# Patient Record
Sex: Female | Born: 1975
Health system: Southern US, Community
[De-identification: ages and names within clinical notes are randomized; demographics above are authoritative.]

## PROBLEM LIST (undated history)

## (undated) DIAGNOSIS — I10 Essential (primary) hypertension: Secondary | ICD-10-CM

## (undated) DIAGNOSIS — F1010 Alcohol abuse, uncomplicated: Secondary | ICD-10-CM

## (undated) DIAGNOSIS — D649 Anemia, unspecified: Secondary | ICD-10-CM

## (undated) DIAGNOSIS — R51 Headache: Secondary | ICD-10-CM

## (undated) DIAGNOSIS — R519 Headache, unspecified: Secondary | ICD-10-CM

## (undated) DIAGNOSIS — Z720 Tobacco use: Secondary | ICD-10-CM

## (undated) DIAGNOSIS — F1910 Other psychoactive substance abuse, uncomplicated: Secondary | ICD-10-CM

## (undated) DIAGNOSIS — N189 Chronic kidney disease, unspecified: Secondary | ICD-10-CM

## (undated) HISTORY — PX: DILATION AND CURETTAGE OF UTERUS: SHX78

---

## 1998-10-21 ENCOUNTER — Emergency Department (HOSPITAL_COMMUNITY): Admission: EM | Admit: 1998-10-21 | Discharge: 1998-10-22 | Payer: Self-pay

## 1999-04-23 ENCOUNTER — Emergency Department (HOSPITAL_COMMUNITY): Admission: EM | Admit: 1999-04-23 | Discharge: 1999-04-23 | Payer: Self-pay | Admitting: *Deleted

## 1999-11-03 ENCOUNTER — Emergency Department (HOSPITAL_COMMUNITY): Admission: EM | Admit: 1999-11-03 | Discharge: 1999-11-04 | Payer: Self-pay | Admitting: *Deleted

## 2000-03-10 ENCOUNTER — Emergency Department (HOSPITAL_COMMUNITY): Admission: EM | Admit: 2000-03-10 | Discharge: 2000-03-11 | Payer: Self-pay | Admitting: Emergency Medicine

## 2000-03-13 ENCOUNTER — Observation Stay (HOSPITAL_COMMUNITY): Admission: AD | Admit: 2000-03-13 | Discharge: 2000-03-14 | Payer: Self-pay | Admitting: *Deleted

## 2000-03-13 ENCOUNTER — Encounter: Payer: Self-pay | Admitting: Emergency Medicine

## 2000-08-12 ENCOUNTER — Encounter (INDEPENDENT_AMBULATORY_CARE_PROVIDER_SITE_OTHER): Payer: Self-pay | Admitting: Specialist

## 2000-08-12 ENCOUNTER — Encounter: Payer: Self-pay | Admitting: Emergency Medicine

## 2000-08-12 ENCOUNTER — Observation Stay (HOSPITAL_COMMUNITY): Admission: RE | Admit: 2000-08-12 | Discharge: 2000-08-13 | Payer: Self-pay | Admitting: *Deleted

## 2001-09-20 ENCOUNTER — Other Ambulatory Visit: Admission: RE | Admit: 2001-09-20 | Discharge: 2001-09-20 | Payer: Self-pay | Admitting: *Deleted

## 2002-03-09 ENCOUNTER — Ambulatory Visit (HOSPITAL_COMMUNITY): Admission: EM | Admit: 2002-03-09 | Discharge: 2002-03-10 | Payer: Self-pay | Admitting: Emergency Medicine

## 2002-03-09 ENCOUNTER — Encounter (INDEPENDENT_AMBULATORY_CARE_PROVIDER_SITE_OTHER): Payer: Self-pay | Admitting: Specialist

## 2002-03-10 ENCOUNTER — Encounter: Payer: Self-pay | Admitting: Emergency Medicine

## 2004-02-02 ENCOUNTER — Emergency Department (HOSPITAL_COMMUNITY): Admission: EM | Admit: 2004-02-02 | Discharge: 2004-02-03 | Payer: Self-pay | Admitting: *Deleted

## 2004-02-05 ENCOUNTER — Inpatient Hospital Stay (HOSPITAL_COMMUNITY): Admission: EM | Admit: 2004-02-05 | Discharge: 2004-02-08 | Payer: Self-pay | Admitting: Emergency Medicine

## 2004-03-18 ENCOUNTER — Emergency Department (HOSPITAL_COMMUNITY): Admission: EM | Admit: 2004-03-18 | Discharge: 2004-03-18 | Payer: Self-pay | Admitting: Emergency Medicine

## 2004-11-25 ENCOUNTER — Emergency Department (HOSPITAL_COMMUNITY): Admission: EM | Admit: 2004-11-25 | Discharge: 2004-11-25 | Payer: Self-pay | Admitting: Emergency Medicine

## 2004-11-26 ENCOUNTER — Emergency Department (HOSPITAL_COMMUNITY): Admission: EM | Admit: 2004-11-26 | Discharge: 2004-11-27 | Payer: Self-pay | Admitting: Emergency Medicine

## 2004-11-28 ENCOUNTER — Inpatient Hospital Stay (HOSPITAL_COMMUNITY): Admission: EM | Admit: 2004-11-28 | Discharge: 2004-12-01 | Payer: Self-pay | Admitting: *Deleted

## 2006-02-04 ENCOUNTER — Inpatient Hospital Stay (HOSPITAL_COMMUNITY): Admission: EM | Admit: 2006-02-04 | Discharge: 2006-02-06 | Payer: Self-pay | Admitting: Emergency Medicine

## 2009-03-26 ENCOUNTER — Emergency Department (HOSPITAL_COMMUNITY): Admission: EM | Admit: 2009-03-26 | Discharge: 2009-03-26 | Payer: Self-pay | Admitting: Emergency Medicine

## 2009-07-30 ENCOUNTER — Emergency Department (HOSPITAL_COMMUNITY): Admission: EM | Admit: 2009-07-30 | Discharge: 2009-07-30 | Payer: Self-pay | Admitting: Emergency Medicine

## 2010-11-26 LAB — DIFFERENTIAL
Eosinophils Relative: 0 % (ref 0–5)
Monocytes Relative: 5 % (ref 3–12)
Neutro Abs: 13.1 10*3/uL — ABNORMAL HIGH (ref 1.7–7.7)
Neutrophils Relative %: 87 % — ABNORMAL HIGH (ref 43–77)

## 2010-11-26 LAB — URINE MICROSCOPIC-ADD ON

## 2010-11-26 LAB — COMPREHENSIVE METABOLIC PANEL
ALT: 31 U/L (ref 0–35)
AST: 22 U/L (ref 0–37)
Albumin: 4.9 g/dL (ref 3.5–5.2)
Alkaline Phosphatase: 51 U/L (ref 39–117)
CO2: 20 mEq/L (ref 19–32)
Calcium: 10.4 mg/dL (ref 8.4–10.5)
Chloride: 109 mEq/L (ref 96–112)
Potassium: 3.3 mEq/L — ABNORMAL LOW (ref 3.5–5.1)
Sodium: 144 mEq/L (ref 135–145)
Total Bilirubin: 0.7 mg/dL (ref 0.3–1.2)

## 2010-11-26 LAB — URINALYSIS, ROUTINE W REFLEX MICROSCOPIC
Ketones, ur: 15 mg/dL — AB
Leukocytes, UA: NEGATIVE
Nitrite: NEGATIVE
Protein, ur: 100 mg/dL — AB
Specific Gravity, Urine: 1.038 — ABNORMAL HIGH (ref 1.005–1.030)
Urobilinogen, UA: 0.2 mg/dL (ref 0.0–1.0)
pH: 5.5 (ref 5.0–8.0)

## 2010-11-26 LAB — GC/CHLAMYDIA PROBE AMP, GENITAL: Chlamydia, DNA Probe: NEGATIVE

## 2010-11-26 LAB — CBC
HCT: 44.3 % (ref 36.0–46.0)
MCV: 98.4 fL (ref 78.0–100.0)
RBC: 4.5 MIL/uL (ref 3.87–5.11)
RDW: 13.5 % (ref 11.5–15.5)
WBC: 15.1 10*3/uL — ABNORMAL HIGH (ref 4.0–10.5)

## 2010-11-26 LAB — POCT PREGNANCY, URINE: Preg Test, Ur: NEGATIVE

## 2010-11-26 LAB — WET PREP, GENITAL

## 2011-01-10 NOTE — Op Note (Signed)
NAMEETHNE, BENTS NO.:  [id number]   MEDICAL RECORD NO.:  [id number]          PATIENT TYPE:  INP   LOCATION:  0104                         FACILITY:  [facility]   PHYSICIAN:  Edsel Petrin. Dalbert Batman, M.D.DATE OF BIRTH:  May 28, 1976   DATE OF PROCEDURE:  02/04/2006  DATE OF DISCHARGE:                                 OPERATIVE REPORT   PREOPERATIVE DIAGNOSIS:  Large left gluteal abscess.   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   OPERATION PERFORMED:  Anoscopy, incision and drainage of ischiorectal  abscess, left side; repair of fistula in ano, left posterior with partial  division and placement of seton.   SURGEON:  Edsel Petrin. Dalbert Batman, M.D.   OPERATIVE INDICATIONS:  This is a 35 year old black female who presented to  the emergency room with a 1-week history of progressive pain and swelling in  the left gluteal area.  On exam she had a very large, indurated abscess  filling the medial part of the left gluteal area.  It was hard to tell  whether this was perirectal or pilonidal as it extended throughout the  entire area.  She was advised to undergo examination under anesthesia and  drainage and examination of the rectum.  She is brought to operating room  urgently.   OPERATIVE TECHNIQUE:  Following induction of general endotracheal  anesthesia, the patient was placed in a prone jackknife position.  Digital  rectal exam was performed and I did not feel any internal mass, but I did  feel that the abscess was close to the rectum externally.  The buttock and  perianal and perineal areas were prepped and draped in sterile fashion.  The  patient had already been given antibiotics.   I performed anoscopy initially after evacuating the rectum.  I found some  purulent drainage just to the left of the posterior midline.  I passed a  rectal probe through this and it did not actually  come through the skin or  into an abscess and it almost came through the skin and so I opened this up  and divided some of the internal sphincter.  I was not sure whether the  external sphincter was involved, so I placed a 0 Surgilon suture around this  and tied it down and left the cut ends long as a seton.   I then took a syringe and needle and passed this through the skin of the  buttock in the left posterior position and I immediately got lots purulent  fluid which was sent for culture.  I then made a radially oriented  elliptical incision in the left posterior position and drained a large  abscess.  I digitally explored this and it went cephalad for a short  distance and also went well down into the left anterior position.  The  recesses were fairly deep.  I felt that to get better drainage I would make  a counter incision in the left anterior position so I made a radially  oriented incision in the left anterior position and entered the  abscess  cavity.  Hemostasis was achieved with electrocautery.  The abscess cavity  was copiously irrigated with saline.  The abscess really did not have a very  foul odor and there was no evidence of any necrosis or fasciitis.  After  irrigating further, I further explored the abscess and felt like it stayed  completely on the left side, did not cross the midline at all, did not  extend deeply into the gluteal muscle and the most cephalad extension of the  abscess was all broken up.  I placed a 1/2 inch Penrose drain across the  entire abscess length and brought it out through the left anterior incision  and sutured it to the skin with nylon suture.  I inspected the anal canal  and distal rectum and it looked fine.  There was no bleeding.  Hemostasis  was good at this point.  I placed lots of dry gauze, ABD pads and fish met  panties to dress the wound.  The patient tolerated the procedure well and  was taken to recovery room in stable condition.   Estimated blood loss was  about 30 mL to 40 mL.   COMPLICATIONS:  None.  Sponge, needle and instrument counts were correct.      Edsel Petrin. Dalbert Batman, M.D.  Electronically Signed     HMI/MEDQ  D:  02/04/2006  T:  02/04/2006  Job:  [id number]

## 2011-01-10 NOTE — Discharge Summary (Signed)
[facility]  Patient:    Melody [name], Melody [name]                   MRN: [id number] Adm. Date:  [id number] Disc. Date: [id number] Attending:  Rolland Porter                           Discharge Summary  DISCHARGE DIAGNOSIS:          Pelvic inflammatory disease.  SUMMARY:                      The patient is a 35 year old female who was seen in triage and diagnosed with acute pelvic inflammatory disease.  The patient was admitted and started on IV antibiotics and was carried through for approximately 24 hours.  After this period of time, the patient was having a minimal amount of pelvic pain and was discharged on antibiotics, Ampicillin 500 mg p.o. q.6h. to be followed up in the office.  The patient understands and is being discharged. DD:  04/22/00 TD:  04/23/00 Job: [id number]

## 2011-01-10 NOTE — Consult Note (Signed)
[facility]  Patient:    Melody [name], Melody [name] Visit Number: [id number] MRN: [id number]          Service Type: OBV Location: 1E 0103 01 Attending Physician:  Lacretia Leigh Dictated by:   Joaquin Bend, M.D. Proc. Date: 03/10/02 Admit Date:  03/09/2002   CC:         OB/GYN   Consultation Report  REFERRING PHYSICIAN:  Carilyn Goodpasture. Atilano Median, M.D.  REASON FOR CONSULTATION:  Positive pregnancy test, right lower quadrant pain, and history of ectopic.  HISTORY OF PRESENT ILLNESS:  This is a 35 year old gravida 3, para 1-0-1-1, who presented to the emergency department on July 16 with several days of vomiting and right lower quadrant pain.  She did not know she was pregnant. Her last period was in June, and she was not contracepting.  She was found to have a positive pregnancy test.  An ultrasound was performed, which showed a probable right adnexal pregnancy, probably in the fallopian tube, with a gestational sac, fetal pole, and heart tones, measuring approximately 2 cm, a small amount of free fluid in the pelvis, and a probable corpus luteum on the right ovary.  The patient received IV hydration and consultation was requested for management of this condition.  PAST MEDICAL HISTORY:  The patient has a history of one previous vaginal delivery, also a previous left ectopic pregnancy managed with laparotomy and removal of left tube.  There was no mention by the patient of any pelvic pathology at that time.  The patient is otherwise healthy.  Past medical history negative for heart murmur or kidney disease.  ALLERGIES:  No known drug allergies.  SOCIAL HISTORY:  She smokes about one-third pack of cigarettes daily.  No alcohol or drug use is acknowledged.  She comes with her boyfriend.  She does not desire increased risk of conservative management and recurrent ectopic pregnancy for management of this condition.  REVIEW OF SYSTEMS:  She has no history of  STDs or PID, and review of systems is otherwise negative.  FAMILY HISTORY:  Not significant.  PHYSICAL EXAMINATION:  VITAL SIGNS:  Stable with a blood pressure of 128/93, temperature 98.8, pulse rate 89, respiratory rate 18.  GENERAL:  The patient is an alert and oriented, well-developed black female.  HEENT:  Normal.  NECK:  Without JVD or thyromegaly.  CHEST:  Lungs clear to auscultation and percussion.  CARDIAC:  Regular rhythm.  BREASTS:  Not examined.  ABDOMEN:  Mild tenderness in the right lower quadrant without rebound, mass, or guarding.  There is no CVA tenderness.  PELVIC:  A yellowish discharge in the vagina.  No cervical motion tenderness. Closed cervix.  Tenderness in the right adnexal region with mass.  LABORATORY DATA:  WBC 10.3, hemoglobin 15.1.  Glucose 115.  Quantitative hCG is 18,256.  Wet prep shows positive clue cells, otherwise negative.  GC and Chlamydia cultures are pending.  IMPRESSION:  Nausea, vomiting, and pregnancy with likely right ectopic.  PLAN:  In discussion with the patient, she does not desire conservative management or the 30% risk of recurrent ectopic with conservative therapy. She would rather have removal of the tube if the ectopic pregnancy is present in the right fallopian tube.  The patient understands that removal of the right fallopian tube with a history of a previous left salpingectomy would make her unable to have further children in the normal fashion, and she accepts this.  She is not a candidate for medical management because of  the size of the ectopic pregnancy being 2 cm, the quantitative hCG being greater than 15,000, and the positive fetal heart tones.  Will proceed with diagnostic laparoscopy and treatment of right ectopic pregnancy with the possibility of laparotomy.  Surgical complications, including bleeding, infection, damage to gallbladder, pelvic organs, anesthetic complications, and drug reactions  all explained to the patient and accepted.  The operating room was notified.  The patient has been NPO except for a few ice chips for eight hours. Dictated by:   Joaquin Bend, M.D. Attending Physician:  Lacretia Leigh DD:  03/10/02 TD:  03/10/02 Job: [id number]

## 2011-01-10 NOTE — Op Note (Signed)
NAMEBINH, FURIA NO.:  [id number]   MEDICAL RECORD NO.:  [id number]          PATIENT TYPE:  INP   LOCATION:  0104                         FACILITY:  [facility]   PHYSICIAN:  Edsel Petrin. Dalbert Batman, M.D.DATE OF BIRTH:  [birth year]   DATE OF PROCEDURE:  DATE OF DISCHARGE:                                 OPERATIVE REPORT   Audio too short to transcribe (less than 5 seconds)      Edsel Petrin. Dalbert Batman, M.D.     HMI/MEDQ  D:  02/04/2006  T:  02/04/2006  Job:  [id number]

## 2011-01-10 NOTE — Op Note (Signed)
[facility]  Patient:    Melody [name], Melody [name] Visit Number: [id number] MRN: [id number]          Service Type: EMS Location: ED Attending Physician:  Beaulah Dinning Dictated by:   Joaquin Bend, M.D. Proc. Date: 03/10/02 Admit Date:  03/09/2002 Discharge Date: 03/10/2002                             Operative Report  PREOPERATIVE DIAGNOSIS:  Right lower quadrant pain.  Suspect right, unruptured ectopic pregnancy.  POSTOPERATIVE DIAGNOSES: 1. Right lower quadrant pain.  Suspect right, unruptured ectopic pregnancy. 2. Chronic pelvic inflammatory disease.  OPERATION:  Laparoscopic right partial salpingectomy and lysis of adhesions.  SURGEON:  Joaquin Bend, M.D.  ANESTHESIA:  General plus 14 cc 0.25% Marcaine in the skin and over the tubal incision site.  ESTIMATED BLOOD LOSS:  Less than 20 cc.  COMPLICATIONS:  None.  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  INDICATIONS:  This is a 35 year old, gravida 3, para 1-0-1-1, last menstrual period in June, presented to the emergency department at [facility] was initially evaluated by Dr. Atilano Median, and consultation was made for ectopic pregnancy when an ultrasound suggested a fetal pole in the right adnexa with a fetal heartbeat and no intrauterine pregnancy and a quantitative hCG of 18,000.  Discussion with the patient of medical versus conservative surgical versus aggressive surgical management and the recurrence rate of ectopic resulted in the patient electing salpingectomy which decreased the risk of recurrence of a subsequent ectopic.  DESCRIPTION OF PROCEDURE:  After informed consent, the patient was taken to the operating room.  She was given general anesthetic, placed in dorsal lithotomy position.  Abdomen, perineum, vagina, and urethra were prepped and draped sterilely, a Foley  catheter placed in her bladder.  Bimanual examination revealed a mid position uterus with a fullness in the right adnexa.  A sponge stick was placed in the posterior vaginal vault.  A vertical subumbilical incision was made after 3 cc of 0.25% Marcaine placed in the subumbilical skin and 2 cc in the suprapubic and right lower quadrant skin areas.  Veress needle was placed, and 2.5 liters of carbon dioxide gas was insufflated, creating a pneumoperitoneum; laparoscopic trocar and sleeve were introduced and pelvic findings reviewed.  Under direct vision, a 5 mm right lower quadrant port and a 10 mm suprapubic port were placed.  The following pelvic findings were noted.  There appeared to be no injury from laparoscopic trocar placement.  There was approximately 20 cc of nonclotted blood in the pelvis.  There was evidence of previous partial excision of the left tube. The left ovary appeared normal.  There was a violin string adhesion from the omentum to the proximal portion of the left tube.  There was adhesion from the omentum to the anterior abdominal wall.  There were Fitz-Hugh and Curtis adhesions over the liver.  There was a proximal 2 cm ectopic pregnancy in the ampullary portion of the right tube.  The tube was freely mobile, and there was evidence of some bleeding out the end of the tube but no excessive hemorrhaging.  Using bipolar cutting forceps, the distal four-fifths of the tube was removed by cauterizing and incising along the mesosalpinx and after going across the tube and completely excising the ectopic pregnancy.  There was excellent hemostasis.  Marcaine was drizzled  along this operative site. Irrigation and suction removed any blood and debris.  Photodocumentation was made.  The suprapubic port was then removed after the ectopic had been placed in an EndoCatch bag.  The fascia was widened with a clamp and the bag removed. The suprapubic port fascia was then closed with Dexon  suture.  Visualization of the area of closure revealed no adherence of any structure to the anterior abdominal wall.  The other ports were removed, the gas allowed to escape, the incisions closed with 4-0 Vicryl, the vaginal instrument removed.  Sponge, needle, and instrument counts were correct.  The patient received 1 g of Ancef before the case began and was then awakened and transferred to recovery area in good condition. Dictated by:   Joaquin Bend, M.D. Attending Physician:  Beaulah Dinning DD:  03/10/02 TD:  03/14/02 Job: [id number]

## 2011-01-10 NOTE — Op Note (Signed)
[facility]  Patient:    Melody [name], Melody [name]                   MRN: [id number] Proc. Date: 08/12/00 Adm. Date:  [id number] Attending:  Margreta Journey                           Operative Report  PREOPERATIVE DIAGNOSES:       Left tubal ectopic pregnancy.  OPERATION:                    Exploratory laparotomy with a left partial salpingectomy.  POSTOPERATIVE DIAGNOSES:      Left tubal ectopic pregnancy.  SURGEON:                      Margreta Journey, M.D.  ANESTHESIA:                   General.  ESTIMATED BLOOD LOSS:         100 cc.                                Patient tolerated the procedure well and returned to the recovery room in satisfactory condition.  PROCEDURE:                    Patient was taken to the operating room, prepped and draped in the usual fashion for an exploratory laparotomy.  A Pfannenstiel incision was made.  This was carried down to the fascia at which time the fascia was entered and excised the extent of the incision.  The midline was identified and rectus muscles separated.  The abdominal peritoneum was then entered in the vertical fashion using Metzenbaum scissors.  Evaluation of the pelvic cavity revealed a moderate amount of adhesions present.  However, with some manipulation the left tube was grasped and approximately 10-[redacted] week gestational sac was present.  The tube had small ruptures around it, therefore a salpingectomy using 1-0 Chromic for tying.  This was severed and labeled and sent to pathology.  At this point hemostasis was present.  Sponge and needle count was correct x 2.  The right tube and ovary and the left ovary all appeared to be within normal limits.  The abdominal peritoneum then closed using 2-0 Chromic in a running stitch followed by closure of the fascia using 1-0 Dexon in a running stitch.  A 4-0 Monocryl used with a subcuticular stitch to close the skin.  Procedure terminated.  The patient  tolerated procedure well and returned to the recovery room in satisfactory condition.  Patient is going to be admitted for observation for the next 23 hours and reevaluated. If okay, will be discharged.  If not, will be further followed. DD:  08/12/00 TD:  08/13/00 Job: [id number]

## 2011-01-10 NOTE — H&P (Signed)
[facility]  Patient:    Melody [name], Melody [name]                   MRN: [id number] Adm. Date:  [id number] Attending:  Margreta Journey                         History and Physical  CHIEF COMPLAINT:              The patient is a 35 year old female who came to the [facility] Emergency Room for evaluation of vaginal bleeding.  HISTORY OF PRESENT ILLNESS:   The patient stated at that time that she was approximately 10-[redacted] weeks gestation.  On evaluation the patient was having very minimal tenderness and an ultrasound was obtained which showed nothing in the uterus and a complex mass on the left adnexal area.  The patient was re-evaluated by me and at that time I felt the patient had a left ectopic pregnancy and scheduled her for an exploratory laparotomy for removal of same if present.  All this was explained to the patient, who understood and accepted.  PAST MEDICAL HISTORY:         Noncontributory.  PHYSICAL EXAMINATION:  GENERAL:                      The patient is a well-developed, well-nourished female in abdominal distress.  HEENT:                        Within normal limits.  NECK:                         Supple.  BREAST:                       Without masses, tenderness, or discharge.  LUNGS:                        Clear to auscultation and percussion.  HEART:                        Normal sinus rhythm without murmurs, rubs, or gallops.  ABDOMEN:                      Benign except for the mid left lower quadrant where moderate to severe tenderness was present.  No rebound tenderness was noted.  PELVIC:                       External genitalia and BUS within normal limits. The vagina had a small amount of blood in the vault.  The cervix was firm and nontender.  The uterus was approximately six to seven weeks in size.  The right adnexa was benign.  The left adnexa revealed an area which was severely tender on palpation.  Unable to  delineate the size of this area.  ADMISSION DIAGNOSIS:          Left ectopic pregnancy, tubal.  PLAN:                         The plan is for exploratory laparotomy with removal of ectopic pregnancy. DD:  08/12/00 TD:  08/13/00 Job: [id number]

## 2011-01-10 NOTE — H&P (Signed)
Melody [name], JOAS NO.:  [id number]   MEDICAL RECORD NO.:  [id number]          PATIENT TYPE:  EMS   LOCATION:  ED                           FACILITY:  [facility]   PHYSICIAN:  Edsel Petrin. Dalbert Batman, M.D.DATE OF BIRTH:  03-11-1976   DATE OF ADMISSION:  02/04/2006  DATE OF DISCHARGE:                                HISTORY & PHYSICAL   CHIEF COMPLAINT:  Pain and swelling left buttock.   HISTORY OF PRESENT ILLNESS:  This is a 35 year old black female, previously  healthy.  For the past week she had developed progressive pain and swelling  in the left buttock.  She stated that this started up high in the presacral  area and extended down toward the rectum as the days went by.  She has been  taking tubs baths hopping that it would pop and drain but it never did.  She  has not had any prior rectal problems.  She has had chills and fever.  I was  called by Dr. Orlie Dakin to see the patient.   PAST HISTORY:  Two surgeries for ectopic pregnancy, one through a  Pfannenstiel incision, one through a laparoscopic incision; otherwise,  healthy.   CURRENT MEDICATIONS:  None.   DRUG ALLERGIES:  None known.   SOCIAL HISTORY:  Single.  Has two sons.  Smokes some cigarettes.  Drinks  alcohol several times a week, perhaps three drinks a night.  She works as a  Psychologist, counselling at [employer].  She denies the use of cocaine or  heroin.  She smoked marijuana 2 weeks ago.   FAMILY HISTORY:  Mother living, has hypertension and some type of heart  problem.  Father deceased, had liver disease.   REVIEW OF SYSTEMS:  Fifteen-system review of systems is performed and is  noncontributory except as described above.   PHYSICAL EXAMINATION:  GENERAL:  Pleasant, healthy-appearing young black  female in mild to moderate distress.  VITAL SIGNS:  Temperature 102.6, pulse 63, respirations 16, blood pressure  136/85.  HEENT:  Eyes:  Sclerae clear.  Extraocular movements intact.  Ears,  nose,  mouth and throat, nose, lips, tongue and oropharynx are without gross  lesions.  NECK:  Supple, nontender.  I do not palpate any mass or jugular venous  distension.  LUNGS:  Clear to auscultation.  No chest wall tenderness.  HEART:  Regular rate and rhythm, no murmur.  Radial, femoral, and posterior  tibial pulses are palpable.  No peripheral edema.  ABDOMEN:  Soft, flat, nontender.  Well-healed incisions.  No palpable mass.  Liver and spleen not enlarged.  GENITOURINARY:  The entire left buttock is swollen and indurated and  exquisitely tender.  This extends from the presacral area all the way down  toward the rectum but is not a classic perirectal abscess.  There is no skin  necrosis, there is no drainage.  RECTAL:  She was too tender to do a digital rectal exam but examination of  the rectal area shows no real focal abnormality.  EXTREMITIES:  She moves all four extremities well without pain or deformity.  NEUROLOGIC:  No gross motor or sensory deficits.   ASSESSMENT:  Large left buttock abscess.  This may be an extensive pilonidal  abscess or possibly a variant of a perirectal abscess.  It is not clear from  the physical exam today, although historically it did start up higher in the  presacral area.   PLAN:  The patient will be admitted, started on intravenous antibiotics, lab  work will be drawn, and she will be taken to operating room today for  examination under anesthesia and drainage of this abscess.   I have discussed the indications and details of surgery with the patient.  Risks and complications have been outlined, including but not limited to  bleeding; infection; recurrent infection; sphincter damage; cardiac,  pulmonary and thromboembolic problems.  She seems understand these issues  well.  At this time all of her questions are answered.  She is in full  agreement with this plan.      Edsel Petrin. Dalbert Batman, M.D.  Electronically Signed     HMI/MEDQ  D:   02/04/2006  T:  02/04/2006  Job:  [id number]

## 2011-01-10 NOTE — H&P (Signed)
Melody [name], Melody [name] NO.:  [id number]   MEDICAL RECORD NO.:  [id number]          PATIENT TYPE:  INP   LOCATION:  0101                         FACILITY:  [facility]   PHYSICIAN:  Jerelene Redden, MD      DATE OF BIRTH:  [birth year]   DATE OF ADMISSION:  11/28/2004  DATE OF DISCHARGE:                                HISTORY & PHYSICAL   Melody [name] is a 35 year old lady who reports that on Friday night, she  had several alcoholic beverages with friends.  On Saturday morning when she  got up, she felt nauseated and vomited on several occasions.  She initially  attributed these symptoms to a hangover, but the symptoms persisted and did  not resolve with time and rest; therefore, she initially presented to the  [facility] emergency room on April 3.  At that time, her urinalysis showed  evidence of pyuria and moderate blood was identified.  She was given  suppository for nausea and was placed on Macrobid on a b.i.d. schedule for  her urinary tract findings.  Unfortunately, she has continued to vomit.  During the course of this illness, she has not had any dysuria, urinary  frequency, hesitancy, or nocturia.  Yesterday, she had a pain which she  localized to the right flank area and said it seemed to be worse when she  took a deep breath.  Today, she has had intermittent cramping discomfort  that seems to radiate across the back of the chest bilaterally.  She is now  aware of having had any fever.  There has been no headache.  No change in  bowel habits.  Her menstrual periods are regular.  Her last menstrual period  was March 28.  Because of persistent vomiting associated with hypokalemia,  she is admitted at this time.   CURRENT MEDICATIONS:  None.   MEDICAL ILLNESSES:  1.  Patient has had ectopic pregnancies, in December, 2001, and July, 2003.  2.  There is a history of STDs, including Chlamydia.   PAST SURGICAL HISTORY:  The patient's only surgeries have been  related to  the ectopic pregnancies, as mentioned above.   SOCIAL HISTORY:  The patient is gravida 3, para 1.  She has two ectopic  pregnancies.  She has two children who are 12 and 2. one of whom is adopted.  She formerly smoked marijuana but not recently.  She has not smoked  cigarettes.  She will occasionally drink alcohol but not consistently.  She  has worked as a Magazine features editor in the past.   FAMILY HISTORY:  Remarkable for renal failure in the patient's grandmother,  uncle, and cousin.   REVIEW OF SYSTEMS:  HEAD:  She denies headache or dizziness.  EYES:  He  denies visual blurring or diplopia.  ENT:  Denies sinus pain, earache, or  sore throat.  CHEST:  See above.  CARDIOVASCULAR:  Denies orthopnea, PND, or  ankle edema.  GI:  See above.  Note that there is no history of hematemesis  or melena.  GU:  See above.  GYN:  See above.  NEURO:  There is no history  of seizure or stroke.  ENDO:  Denies excessive thirst, urinary frequency, or  nocturia.   PHYSICAL EXAMINATION:  VITAL SIGNS:  Temperature 99.3, blood pressure  150/100, pulse 100, respirations 18.  O2 saturation is 100%.  HEENT:  Within normal limits.  CHEST:  A few rhonchi which seem to clear with coughing.  Breath sounds  sound normal otherwise.  I do not hear any wheezing.  CARDIOVASCULAR:  Normal S1 and S2.  There are no rubs or gallops.  ABDOMEN:  Benign.  There are normal bowel sounds.  There is no masses or  tenderness.  No guarding or rebound.  BACK:  Examination of the back does not reveal any pain to fist percussion  or palpation.  EXTREMITIES:  Normal.  NEUROLOGIC:  Normal.   White count is 16,600.  Hemoglobin is 14.  The neutrophils are 83%.  Lymphocytes 11%.  Urinalysis continues to show increased white blood cells.  Potassium is 2.9.   IMPRESSION:  1.  Persistent vomiting:  This illness closely resembles a presentation the      patient had in June, 2005.  At that time, there was no obvious       explantation found for the patient's persistent vomiting.  2.  Hypokalemia.  3.  Evidence of urinary tract infection.  4.  History of ectopic pregnancies.  5.  History of sexually transmitted disease.   To begin with, I think this patient should be managed conservatively with IV  fluids.  I am going to place her empirically on IV Cipro for presumed  urinary tract infection.  I am going to obtain a chest x-ray and abdominal  ultrasound.  Nausea medication will be administered as needed.  We will  follow her closely.      SY/MEDQ  D:  11/28/2004  T:  11/28/2004  Job:  [id number]

## 2011-01-10 NOTE — Discharge Summary (Signed)
[name], [name]            ACCOUNT NO.:  [id number]   MEDICAL RECORD NO.:  [id number]          PATIENT TYPE:  INP   LOCATION:  [id number]                         FACILITY:  [facility]   PHYSICIAN:  Melissa L. Lovena Le, MD  DATE OF BIRTH:  Nov 13, 1975   DATE OF ADMISSION:  11/28/2004  DATE OF DISCHARGE:  12/01/2004                                 DISCHARGE SUMMARY   DISCHARGE DIAGNOSES:  1.  Urinary tract infection responding to therapy with Cipro.  2.  Intractable nausea secondary to #1, resolved.  3.  Hypokalemia.  The patient has been repleted with IV and orally.  Will      direct her to a potassium rich diet and supplement her orally x1 prior      to discharge.  4.  Cardiovascular.  The patient has a holosystolic murmur.  She states she      has had a murmur since she was a child, but at the age of 35 I would      like to have this checked.  I have recommended that she have a 2-D echo      as an outpatient.  The patient seems reliable and states that she will      follow up on this.  She is actually waiting for her Medicaid card in the      mail so she will be able to establish a practitioner.  I will provide      her with the [facility] health clinic intake and referral sheet just in      case this is not a true option for her.  At least she will have the      ability to pursue care for herself.  She states she is familiar with      [facility] as her mother utilizes their services.   DISCHARGE MEDICATIONS:  1.  Pepcid 20 mg b.i.d.  2.  Ciprofloxacin 500 mg p.o. q.12h. x5 more days.   HISTORY OF PRESENT ILLNESS:  The patient is a 35 year old African-American  female who developed the onset of nausea and vomiting after being out the  night before, having several alcoholic beverages.  The symptoms persisted  and therefore she presented to [facility] Emergency Room.  She presented to  the emergency room and was diagnosed with a urinary tract infection and  placed on Macrobid b.i.d.   She went home and was then unfortunately unable  to take her medication because she continued to vomit.  She therefore  returned to the hospital with abdominal crampy pain, nausea and vomiting,  and was admitted for further treatment.  In the emergency room she was found  to be hypokalemic and therefore was admitted to the telemetry floor for  further care.  The patient's potassium was repleted.  She was IV rehydrated.  She was started on clear liquids and advanced as tolerated to a full diet.   PHYSICAL EXAMINATION:  VITAL SIGNS:  On the day of discharge the patient's  vital signs remained stable.  Her temperature was 98, blood pressure 134/88,  pulse 88, respirations 18.  GENERAL:  Generally  she is well-developed, well-nourished, in no acute  distress.  HEENT:  Pupils equal, round, and reactive to light.  Extraocular muscles are  intact.  Mucous membranes are moist.  NECK:  Supple.  There is no JVD, no lymphadenopathy, no carotid bruits.  CHEST:  Clear to auscultation.  There are no rhonchi, rales, or wheezes.  CARDIOVASCULAR:  Regular rate and rhythm.  Positive S1/S2 with notable  holosystolic murmur not previously heard on admission.  Nonetheless the  patient has been told in early childhood that she has had a murmur.  EXTREMITIES:  There is no clubbing, cyanosis, or edema.  Pulses are 2+.  NEUROLOGIC:  She is nonfocal.  Cranial nerves II-XII are intact.   LABORATORY DATA:  Pertinent laboratory values during the course of the  hospital stay revealed a hemoglobin of 11.8, hematocrit of 34.3.  Her white  cells are 11.7 but this is down from 15.8 at a maximum, so she is trending  down.  Platelets are 213.  Her sodium is 140, potassium 3.3, chloride is  102, PO2 is 24, BUN is 1, creatinine is 0.7.  Her glucose was 99.  Other  pertinent laboratory values were a negative pregnancy test.  Amylase and  lipase were within normal limits.  LFTs were within normal limits.  Blood  cultures have  been negative x2 sets.  Her urine culture is growing  Acinetobacter and Baumanni and is sensitive to the Ciprofloxacin.  Her urine  drug screen was positive for THC.  The patient is deemed stable for  discharge for further treatment at home.  She is able to keep her food down  as well as medication.  She appears to be reliable with regard to follow-up.  We are going to recommend that she have a 2-D echo as an outpatient to  evaluate her murmur.  She states that she is waiting for her Medicaid card,  at which time she will establish care and obtain a follow-up visit.   CONDITION ON DISCHARGE:  Stable.      MLT/MEDQ  D:  12/01/2004  T:  12/01/2004  Job:  [id number]

## 2011-01-10 NOTE — Discharge Summary (Signed)
Melody [name], Melody [name]                      ACCOUNT NO.:  [id number]   MEDICAL RECORD NO.:  [id number]                   PATIENT TYPE:  INP   LOCATION:  [place]                                 FACILITY:  [facility]   PHYSICIAN:  Corinna L. Conley Canal, MD             DATE OF BIRTH:  August 21, 1976   DATE OF ADMISSION:  02/05/2004  DATE OF DISCHARGE:  02/08/2004                                 DISCHARGE SUMMARY   DIAGNOSES:  1. Gastroenteritis, probably viral.  2. Probable ovarian hemorrhagic cyst.  3. Dehydration.  4. Hypokalemia.   DISCHARGE MEDICATIONS:  Phenergan 25 mg p.o. q.4h. as needed for pain.   DIET:  As tolerated.   ACTIVITY:  She may return to work in four days.   CONDITION:  Stable.   FOLLOW UP:  With her primary care physician as needed.   PERTINENT LABORATORIES:  CBC was significant for a white blood cell count of  12.6 with a normal differential.  Complete metabolic panel was significant  for a potassium of 3.2, total bilirubin 1.3.  Amylase and lipase were  normal.  UA essentially negative.  Urine HCG negative.   SPECIAL STUDIES AND RADIOLOGY:  CT of the abdomen showed a probable  hemorrhagic cyst of the ovary, otherwise negative.   Ultrasound of the abdomen was negative except for a small amount of  gallbladder sludge.   HISTORY AND HOSPITAL COURSE:  [name] is a 35 year old unassigned black  female who presented to the emergency room with nausea, vomiting, and  diarrhea.  She was given IV fluids and antiemetics but continued with  intractable vomiting.  She was therefore put on observation on the  hospitalist service, given IV fluids and antiemetics.  Unfortunately, she  continued to have significant vomiting, even with liquids, so she was  changed to a regular admission.  As she had several days of continued  vomiting, ultrasound was done.  This was normal.  She was ruled out for  pancreatitis.  She never really had any abdominal tenderness but did have a  few episodes of abdominal pain.  A CAT scan was done in the emergency room.  Eventually, her vomiting did subside, and she was tolerating a regular diet  and is therefore being discharged to home.                                               Corinna L. Conley Canal, MD    CLS/MEDQ  D:  02/08/2004  T:  02/09/2004  Job:  [id number]

## 2011-01-10 NOTE — H&P (Signed)
NAME:  Melody [name], Melody [name] NO.:  [id number]   MEDICAL RECORD NO.:  [id number]                   PATIENT TYPE:  INP   LOCATION:  0103                                 FACILITY:  [facility]   PHYSICIAN:  Jon Gills, M.D.             DATE OF BIRTH:  09-Sep-1975   DATE OF ADMISSION:  02/05/2004  DATE OF DISCHARGE:                                HISTORY & PHYSICAL   IDENTIFYING STATEMENT:  This is a 35 year old African-American female, who  does not have a designated primary care physician, who has just selected an  OB/GYN here in town, whom she cannot name at this time.   CHIEF COMPLAINT:  Persistent vomiting.   HISTORY OF PRESENT ILLNESS:  This 35 year old female, who was seen in the  emergency room at [facility] on February 02, 2004 with complaints of  nausea, vomiting, and diarrhea, comes back in with persistent vomiting.  When she was seen on February 02, 2004, she also had some vaginal discharge, for  which they had done a pelvic exam, and had done GC and Chlamydia studies,  which have been negative.  She also had a wet prep, which was negative.  She  was at that time sent home after being given Zofran in the emergency room,  and after receiving a ceftriaxone IM shot and about a gram's worth of  Zithromax for possible cervicitis.  The patient also says that she was given  an anti-emetic to take home; however, her mom had taken it home with her  rather than the patient.  The patient comes back in today secondary to  persistence of the nausea and vomiting.  She says she has vomited a few  times at home today.  She apparently has not been able to keep anything  down.  She has had no more diarrhea since Saturday, but has continued to  have stomach cramps, and she, when asked, gives reference to the pain in the  epigastric area.  She has not been taking in much oral intake.  She did take  Aleve x1 the day before.  She had taken this for stomach cramps.  She  denies  taking any aspirin or any other forms of anti-inflammatories.  She does not  smoke cigarettes.  She does do marijuana; the last time was a couple of  months ago.  She does drink, but her last drink was this past Thursday.  No  one else is sick around her.  Her last menstrual period was the last week of  May.  As mentioned earlier, she did have a pelvic examination done on February 02, 2004.  Her vaginal discharge is mostly whitish with some yellowish tint,  which is not unusual for her.  In the emergency room, she was given IV  fluids, Phenergan, and Dilaudid.  It was felt by the ER physician that the  patient needed admission, and therefore the [facility]  Hospitalist was called.   Her urine pregnancy test was negative here in the emergency room, and the  patient also denies the possibility of pregnancy, since she has had her  fallopian tubes removed secondary to ectopic pregnancies.   She has been soaking in warm water for her stomach cramps.  She also denies  dysuria and frequency of urination.  She also denies hematuria.  Also denies  melena, hematochezia, or heartburn.  She denies hematemesis.   ALLERGIES:  No known drug allergies.   CURRENT MEDICATIONS:  She denies any regular medicines including vitamins.  She denies being on birth control pills also.   PAST MEDICAL HISTORY:  1. Significant for history of ectopic pregnancy in December of 2001 and July     of 2003.  2. She does have a history of Chlamydia, the last one being a couple of     years ago.   PAST SURGICAL HISTORY:  All related to the ectopic pregnancies, the right  one in December of 2001 that she had surgically corrected, and the left one  in July of 2003.   SOCIAL HISTORY:  She is single.  She is gravida 3, para 1, with 2 ectopic  pregnancies.  She has 2 live children, one of whom is adopted.  She does not  smoke cigarettes, but smokes marijuana; the last time was a couple of months  ago.  She does drink maybe  2 drinks 2 times a month.  Her last drink was  this past Thursday.  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  She does not have a full certificate for her  nurse's aid yet at this time.   FAMILY HISTORY:  Kidney failure (she is unclear of the etiology) in the  maternal grandmother, maternal uncle, and maternal side cousin.  There is no  diabetes mellitus, stroke, MI, or cancers of any kind.  There is  hypertension in the mother and maternal aunt.   REVIEW OF SYSTEMS:  As per HPI.  She otherwise notes a sense of chest  tightness when she takes a deep breath, but denies a cough or persistence of  cough.  Denies any frequency of urination or dysuria.  She denies headache.  Does note some sore throat secondary to the persistent emesis.   PHYSICAL EXAMINATION:  VITAL SIGNS:  Her latest vitals show her to have a  temperature of 99.9, saturations of 99% on room air, blood pressure 144/84,  heart rate of 57.  Her initial vitals when she first came to the emergency  room at about noon today were a blood pressure of 135/99, pulse of 78,  respiratory rate 20, saturations of 99% on room air.  The lowest blood  pressure that she has had in the emergency room was 113/73, and the highest  has been 149/106.  GENERAL:  She does not appear to be in any distress, except she does go  through periods of having some nausea with some emesis.  HEENT:  TM's are normal.  Oropharynx is without any erythema.  Tongue  actually looks moist now after the IV fluids.  There is no icterus.  NECK:  There is no adenopathy.  LUNGS:  Clear to auscultation bilaterally without any crackles or wheezes.  HEART:  Regular rate and rhythm.  ABDOMEN:  Bowel sounds are normal, soft, and it is nontender in all  quadrants including the epigastrium.  She says it is not tender,  it is just  a discomfort she says.  BACK:  There is no spinous tenderness, no CVA tenderness. NEUROLOGIC:  There are no  gross neurological deficits.   LABORATORY WORK UP:  As mentioned earlier, her GC and Chlamydia from February 02, 2004 were negative.  Her wet prep was negative.  Her UA done today  showed a specific gravity of 1.040, a small bilirubin.  Ketones are  positive.  Protein is positive.  Leukocyte esterase is trace with  microscopic showing 3-6 WBC's, a few bacteria.  Her CBC shows a white count  of 12.6, hemoglobin and hematocrit of 16.1 and 46 respectively, platelet  count of 204,000.  Her differential is 69% neutrophils.  Lymphocytes of 23.  A BMP or a CMP was not done today; however, a BMP from February 02, 2004 shows a  sodium of 137, potassium of 3.4, chloride and CO2 of 109 and 18, glucose of  164.  BUN and creatinine were normal at 11 and 1.  Calcium of 10.  Once  again, a urine pregnancy test was done, and that was negative.   CT of the abdomen and pelvis was done, and the only mention was the left  ovary shows a ring enhancing lesion with a question of whether it could be a  collapsed or a hemorrhagic cyst.  The radiologist recommends a follow up  ultrasound to reassess this.   ASSESSMENT AND PLAN:  1. Dehydration secondary to persistent nausea and vomiting, some of which is     because she did not have an anti-emetic.  She probably has a component of     gastritis contributing toward some of her nausea.  She is certainly     dehydrated, as evidenced by her upper limit of normal on the hemoglobin     and hematocrit, and the specific gravity on the urine being 1.040, and     also the positive ketones.  I will go ahead and bring her for a 23     observation for IV fluids.  2. In regards to her gastritis, will place her on Protonix IV b.i.d., and     then daily orally.  She most likely will need to go home on a couple of     weeks worth of Protonix.  3. In regards to the left ovarian ring enhancing lesion, suggestive of     collapsed or hemorrhagic cyst, I have informed the patient about  this,     and advised her that she needs to have a follow up ultrasound through her     new gynecologist, and advised her to set up an appointment with her     gynecologist.  4. In regards to question of urinary tract infection by urinalysis, the     patient is asymptomatic; therefore, will not initiate any treatment at     this time.  5. In regards to transient episodic elevation in blood pressure, it could be     related to when the blood pressures are taken around the time when she is     significantly nauseated with some emesis; therefore, will watch this.     There is certainly a family history of hypertension, so she does need to     watch this.  6. In regards to recent mild hypokalemia, will go ahead and do a BMP on her     today to see whether we need to give her any replacements.  Hopefully  this patient can be discharged within the next 23 hours.  Will need to     watch her fever curves, as she was starting to develop some chills    towards the end of my exam, and the low-grade temperature, but all of it     could be towards the tail end of the gastroenteritis, which I am thinking     is on the improving end.                                               Jon Gills, M.D.    RRV/MEDQ  D:  02/06/2004  T:  02/06/2004  Job:  [id number]

## 2011-08-28 ENCOUNTER — Emergency Department (HOSPITAL_COMMUNITY)
Admission: EM | Admit: 2011-08-28 | Discharge: 2011-08-28 | Disposition: A | Discharge status: Home / Self Care | Payer: Self-pay | Attending: Emergency Medicine | Admitting: Emergency Medicine

## 2011-08-28 ENCOUNTER — Encounter: Payer: Self-pay | Admitting: *Deleted

## 2011-08-28 DIAGNOSIS — F17200 Nicotine dependence, unspecified, uncomplicated: Secondary | ICD-10-CM | POA: Insufficient documentation

## 2011-08-28 DIAGNOSIS — R040 Epistaxis: Secondary | ICD-10-CM | POA: Insufficient documentation

## 2011-08-28 MED ORDER — OXYMETAZOLINE HCL 0.05 % NA SOLN
1.0000 | Freq: Once | NASAL | Status: AC
  Administered 2011-08-28: 1 via NASAL
  Filled 2011-08-28: qty 15

## 2011-08-28 MED ORDER — LISINOPRIL 10 MG PO TABS: 10.0000 mg | ORAL_TABLET | Freq: Every day | ORAL | Status: DC

## 2011-08-28 MED ORDER — LISINOPRIL 10 MG PO TABS: 10.0000 mg | ORAL_TABLET | Freq: Once | ORAL | Status: DC

## 2011-08-28 NOTE — ED Notes (Signed)
Pt states "my nose started bleeding x 2 days ago, it was gushing, bled this a.m. Around 0830-0900 and again was gushing, was told I was borderline with high blood pressure last yr, it runs in my family"; pt without active bleeding @ present.

## 2011-08-28 NOTE — ED Provider Notes (Signed)
History     CSN: [id number]  Arrival date & time 08/28/11  [id number]   First MD Initiated Contact with Patient 08/28/11 1120      Chief Complaint  Patient presents with  . Epistaxis    (Consider location/radiation/quality/duration/timing/severity/associated sxs/prior treatment) Patient is a 36 y.o. female presenting with nosebleeds. The history is provided by the patient. No language interpreter was used.  Epistaxis  This is a new problem. The current episode started 6 to 12 hours ago. The problem occurs rarely. The problem has been resolved. The bleeding has been from the left nare. She has tried applying pressure for the symptoms. Her past medical history does not include bleeding disorder, colds, sinus problems, allergies, nose-picking or frequent nosebleeds.  Reports a L nare nose bleed x 20 minutes today with the same 2 days ago.  Resolved now.  No pcp.  Hypertensive in the past. On no meds.  Daily Etoh.    Past Medical History  Diagnosis Date  . Ectopic pregnancy     History reviewed. No pertinent past surgical history.  No family history on file.  History  Substance Use Topics  . Smoking status: Current Everyday Smoker -- 0.5 packs/day  . Smokeless tobacco: Not on file  . Alcohol Use: 4.5 oz/week    9 drink(s) per week    OB History    Grav Para Term Preterm Abortions TAB SAB Ect Mult Living                  Review of Systems  HENT: Positive for nosebleeds.     Allergies  Review of patient's allergies indicates no known allergies.  Home Medications  No current outpatient prescriptions on file.  BP 169/114  Pulse 74  Temp(Src) 99.2 F (37.3 C) (Oral)  Resp 17  Wt 155 lb (70.308 kg)  SpO2 100%  LMP 08/26/2011  Physical Exam  Nursing note and vitals reviewed. Constitutional: She is oriented to person, place, and time. She appears well-developed and well-nourished. No distress.  HENT:  Head: Normocephalic and atraumatic.  Eyes: Pupils are equal, round,  and reactive to light.  Neck: Normal range of motion.  Cardiovascular: Normal rate.  Exam reveals no gallop and no friction rub.   No murmur heard. Pulmonary/Chest: Effort normal.  Abdominal: Soft.  Musculoskeletal: Normal range of motion.  Neurological: She is alert and oriented to person, place, and time.  Skin: Skin is warm and dry.  Psychiatric: She has a normal mood and affect.    ED Course  Procedures (including critical care time)  Labs Reviewed - No data to display No results found.   No diagnosis found.    MDM  Nose bleed x 2 in the last 2 days resolved.  Hypertensive with pmh of the same but on no meds. No pcp. Will start lisinopril 10 mg daily and instructed to keep b/p diary and get a pcp to follow up with asap. Return if uncontrolled nose bleed.    Medical screening examination/treatment/procedure(s) were performed by non-physician practitioner and as supervising physician I was immediately available for consultation/collaboration. Katy Apo, M.D.       Sheryle Hail, NP 08/28/11 Wisner III, MD 08/31/11 2126

## 2011-11-15 ENCOUNTER — Emergency Department (HOSPITAL_COMMUNITY): Payer: Self-pay

## 2011-11-15 ENCOUNTER — Emergency Department (HOSPITAL_COMMUNITY)
Admission: EM | Admit: 2011-11-15 | Discharge: 2011-11-15 | Disposition: A | Discharge status: Home / Self Care | Payer: Self-pay | Attending: Emergency Medicine | Admitting: Emergency Medicine

## 2011-11-15 ENCOUNTER — Encounter (HOSPITAL_COMMUNITY): Payer: Self-pay | Admitting: Emergency Medicine

## 2011-11-15 DIAGNOSIS — L539 Erythematous condition, unspecified: Secondary | ICD-10-CM | POA: Insufficient documentation

## 2011-11-15 DIAGNOSIS — F17200 Nicotine dependence, unspecified, uncomplicated: Secondary | ICD-10-CM | POA: Insufficient documentation

## 2011-11-15 DIAGNOSIS — M25476 Effusion, unspecified foot: Secondary | ICD-10-CM | POA: Insufficient documentation

## 2011-11-15 DIAGNOSIS — Z79899 Other long term (current) drug therapy: Secondary | ICD-10-CM | POA: Insufficient documentation

## 2011-11-15 DIAGNOSIS — M25579 Pain in unspecified ankle and joints of unspecified foot: Secondary | ICD-10-CM | POA: Insufficient documentation

## 2011-11-15 DIAGNOSIS — M109 Gout, unspecified: Secondary | ICD-10-CM | POA: Insufficient documentation

## 2011-11-15 DIAGNOSIS — M79609 Pain in unspecified limb: Secondary | ICD-10-CM | POA: Insufficient documentation

## 2011-11-15 DIAGNOSIS — M25473 Effusion, unspecified ankle: Secondary | ICD-10-CM | POA: Insufficient documentation

## 2011-11-15 MED ORDER — INDOMETHACIN 50 MG PO CAPS: 50.0000 mg | ORAL_CAPSULE | Freq: Three times a day (TID) | ORAL | Status: AC

## 2011-11-15 MED ORDER — HYDROCODONE-ACETAMINOPHEN 5-325 MG PO TABS: 1.0000 | ORAL_TABLET | ORAL | Status: AC | PRN

## 2011-11-15 MED ORDER — INDOMETHACIN 50 MG PO CAPS
50.0000 mg | ORAL_CAPSULE | ORAL | Status: AC
  Administered 2011-11-15: 50 mg via ORAL
  Filled 2011-11-15: qty 1

## 2011-11-15 MED ORDER — HYDROCODONE-ACETAMINOPHEN 5-325 MG PO TABS
1.0000 | ORAL_TABLET | Freq: Once | ORAL | Status: AC
  Administered 2011-11-15: 1 via ORAL
  Filled 2011-11-15: qty 1

## 2011-11-15 NOTE — ED Provider Notes (Signed)
Medical screening examination/treatment/procedure(s) were performed by non-physician practitioner and as supervising physician I was immediately available for consultation/collaboration.   Malvin Johns, MD 11/15/11 [phone]

## 2011-11-15 NOTE — Discharge Instructions (Signed)
Arthritis - Gout Gout is caused by uric acid crystals forming in the joint. The big toe, foot, ankle, and knee are the joints most often affected.Often uric acid levels in the blood are also elevated. Symptoms develop rapidly, usually over hours. A joint fluid exam may be needed to prove the diagnosis. Acute gout episodes may follow a minor injury, surgery, illness or medication change. Gout occurs more often in men. It tends to be an inherited (passed down from a family member) condition. Your chance of having gout is increased if you take certain medications. These include water pills (diuretics), aspirin, niacin, and cyclosporine. Kidney disease and alcohol consumption may also increase your chances of getting gout. Months or years can pass between gout attacks.  Treatment includes ice, rest and raising the affected limb until the swelling and pain are better.Anti-inflammatory medicine usually brings about dramatic relief of pain, redness and swelling within 2 to 3 days. Other medications can also be effective. Increase your fluid intake. Do not drink alcohol or eat liver, sweetbreads, or sardines. Long-term gout management may require medicine to lower blood uric acid levels, or changing or stopping diuretics. Please see your caregiver if your condition is not better after 1 to 2 days of treatment.  SEEK IMMEDIATE MEDICAL CARE IF:  You have more serious symptoms such as a fever, skin rash, diarrhea, vomiting, or other joint pains. Document Released: 09/18/2004 Document Revised: 07/31/2011 Document Reviewed: 10/02/2008 [facility] Patient Information 2012 [place].Gout Gout is an inflammatory condition (arthritis) caused by a buildup of uric acid crystals in the joints. Uric acid is a chemical that is normally present in the blood. Under some circumstances, uric acid can form into crystals in your joints. This causes joint redness, soreness, and swelling (inflammation). Repeat attacks are common. Over  time, uric acid crystals can form into masses (tophi) near a joint, causing disfigurement. Gout is treatable and often preventable. CAUSES  The disease begins with elevated levels of uric acid in the blood. Uric acid is produced by your body when it breaks down a naturally found substance called purines. This also happens when you eat certain foods such as meats and fish. Causes of an elevated uric acid level include:  Being passed down from parent to child (heredity).   Diseases that cause increased uric acid production (obesity, psoriasis, some cancers).   Excessive alcohol use.   Diet, especially diets rich in meat and seafood.   Medicines, including certain cancer-fighting drugs (chemotherapy), diuretics, and aspirin.   Chronic kidney disease. The kidneys are no longer able to remove uric acid well.   Problems with metabolism.  Conditions strongly associated with gout include:  Obesity.   High blood pressure.   High cholesterol.   Diabetes.  Not everyone with elevated uric acid levels gets gout. It is not understood why some people get gout and others do not. Surgery, joint injury, and eating too much of certain foods are some of the factors that can lead to gout. SYMPTOMS   An attack of gout comes on quickly. It causes intense pain with redness, swelling, and warmth in a joint.   Fever can occur.   Often, only one joint is involved. Certain joints are more commonly involved:   Base of the big toe.   Knee.   Ankle.   Wrist.   Finger.  Without treatment, an attack usually goes away in a few days to weeks. Between attacks, you usually will not have symptoms, which is different from many  other forms of arthritis. DIAGNOSIS  Your caregiver will suspect gout based on your symptoms and exam. Removal of fluid from the joint (arthrocentesis) is done to check for uric acid crystals. Your caregiver will give you a medicine that numbs the area (local anesthetic) and use a  needle to remove joint fluid for exam. Gout is confirmed when uric acid crystals are seen in joint fluid, using a special microscope. Sometimes, blood, urine, and X-ray tests are also used. TREATMENT  There are 2 phases to gout treatment: treating the sudden onset (acute) attack and preventing attacks (prophylaxis). Treatment of an Acute Attack  Medicines are used. These include anti-inflammatory medicines or steroid medicines.   An injection of steroid medicine into the affected joint is sometimes necessary.   The painful joint is rested. Movement can worsen the arthritis.   You may use warm or cold treatments on painful joints, depending which works best for you.   Discuss the use of coffee, vitamin C, or cherries with your caregiver. These may be helpful treatment options.  Treatment to Prevent Attacks After the acute attack subsides, your caregiver may advise prophylactic medicine. These medicines either help your kidneys eliminate uric acid from your body or decrease your uric acid production. You may need to stay on these medicines for a very long time. The early phase of treatment with prophylactic medicine can be associated with an increase in acute gout attacks. For this reason, during the first few months of treatment, your caregiver may also advise you to take medicines usually used for acute gout treatment. Be sure you understand your caregiver's directions. You should also discuss dietary treatment with your caregiver. Certain foods such as meats and fish can increase uric acid levels. Other foods such as dairy can decrease levels. Your caregiver can give you a list of foods to avoid. HOME CARE INSTRUCTIONS   Do not take aspirin to relieve pain. This raises uric acid levels.   Only take over-the-counter or prescription medicines for pain, discomfort, or fever as directed by your caregiver.   Rest the joint as much as possible. When in bed, keep sheets and blankets off painful  areas.   Keep the affected joint raised (elevated).   Use crutches if the painful joint is in your leg.   Drink enough water and fluids to keep your urine clear or pale yellow. This helps your body get rid of uric acid. Do not drink alcoholic beverages. They slow the passage of uric acid.   Follow your caregiver's dietary instructions. Pay careful attention to the amount of protein you eat. Your daily diet should emphasize fruits, vegetables, whole grains, and fat-free or low-fat milk products.   Maintain a healthy body weight.  SEEK MEDICAL CARE IF:   You have an oral temperature above 102 F (38.9 C).   You develop diarrhea, vomiting, or any side effects from medicines.   You do not feel better in 24 hours, or you are getting worse.  SEEK IMMEDIATE MEDICAL CARE IF:   Your joint becomes suddenly more tender and you have:   Chills.   An oral temperature above 102 F (38.9 C), not controlled by medicine.  MAKE SURE YOU:   Understand these instructions.   Will watch your condition.   Will get help right away if you are not doing well or get worse.  Document Released: 08/08/2000 Document Revised: 07/31/2011 Document Reviewed: 11/19/2009 [facility] Patient Information 2012 [place].Purine Restricted Diet A low-purine diet consists of foods  that reduce uric acid made in your body. INDICATIONS FOR USE  Your caregiver may ask you to follow a low-purine diet to reduce gout flairs.  GUIDELINES  Avoid high-purine foods, including all alcohol, yeast extracts taken as supplements, and sauces made from meats (like gravy). Do not eat high-purine meats, including anchovies, sardines, herring, mussels, tuna, codfish, scallops, trout, haddock, bacon, organ meats, tripe, goose, wild game, and sweetbreads.  Grains  Allowed/Recommended: All, except those listed to consume in moderation.   Consume in Moderation: Oatmeal (? cup uncooked daily), wheat bran or germ ( cup daily), and whole  grains.  Vegetables  Allowed/Recommended: All, except those listed to consume in moderation.   Consume in Moderation: Asparagus, cauliflower, spinach, mushrooms, and green peas ( cup daily).  Fruit  Allowed/Recommended: All.   Consume in Moderation: None.  Meat and Meat Substitutes  Allowed/Recommended: Eggs, nuts, and peanut butter.   Consume in Moderation: Limit to 4 to 6 oz daily. Avoid high-purine meats. Lentils, peas, and dried beans (1 cup daily).  Milk  Allowed/Recommended: All. Choose low-fat or skim when possible.   Consume in Moderation: None.  Fats and Oils  Allowed/Recommended: All.   Consume in Moderation: None.  Beverages  Allowed/Recommended: All, except those listed to avoid.   Avoid: All alcohol.  Condiments/Miscellaneous  Allowed/Recommended: All, except those listed to consume in moderation.   Consume in Moderation: Bouillon and meat-based broths and soups.  Document Released: 12/06/2010 Document Revised: 07/31/2011 Document Reviewed: 12/06/2010 [facility] Patient Information 2012 [place].

## 2011-11-15 NOTE — ED Provider Notes (Signed)
History     CSN: [id number]  Arrival date & time 11/15/11  [id number]   First MD Initiated Contact with Patient 11/15/11 2047      Chief Complaint  Patient presents with  . Toe Pain    (Consider location/radiation/quality/duration/timing/severity/associated sxs/prior treatment) HPI Comments: Patient here with acute onset of left great toe pain and the MTP joint with redness and inflammation - reports no known injury to the toe - states no history of gout or arthritis - no fever, chills, nausea or vomiting.  Patient is a 36 y.o. female presenting with toe pain. The history is provided by the patient. No language interpreter was used.  Toe Pain This is a new problem. The current episode started yesterday. The problem occurs constantly. The problem has been unchanged. Associated symptoms include arthralgias and joint swelling. Pertinent negatives include no abdominal pain, anorexia, change in bowel habit, chest pain, chills, congestion, coughing, diaphoresis, fatigue, fever, headaches, myalgias, nausea, neck pain, numbness, rash, sore throat, swollen glands, urinary symptoms, vertigo, visual change, vomiting or weakness. The symptoms are aggravated by walking and standing. She has tried nothing for the symptoms. The treatment provided no relief.    Past Medical History  Diagnosis Date  . Ectopic pregnancy     No past surgical history on file.  No family history on file.  History  Substance Use Topics  . Smoking status: Current Everyday Smoker -- 0.5 packs/day  . Smokeless tobacco: Not on file  . Alcohol Use: 4.5 oz/week    9 drink(s) per week    OB History    Grav Para Term Preterm Abortions TAB SAB Ect Mult Living                  Review of Systems  Constitutional: Negative for fever, chills, diaphoresis and fatigue.  HENT: Negative for congestion, sore throat and neck pain.   Respiratory: Negative for cough.   Cardiovascular: Negative for chest pain.  Gastrointestinal:  Negative for nausea, vomiting, abdominal pain, anorexia and change in bowel habit.  Musculoskeletal: Positive for joint swelling and arthralgias. Negative for myalgias.  Skin: Negative for rash.  Neurological: Negative for vertigo, weakness, numbness and headaches.  All other systems reviewed and are negative.    Allergies  Review of patient's allergies indicates no known allergies.  Home Medications   Current Outpatient Rx  Name Route Sig Dispense Refill  . ACETAMINOPHEN 500 MG PO TABS Oral Take 1,500 mg by mouth every 6 (six) hours as needed. For pain    . LISINOPRIL 10 MG PO TABS Oral Take 1 tablet (10 mg total) by mouth daily. 30 tablet 0    BP 174/104  Pulse 74  Temp(Src) 98.5 F (36.9 C) (Oral)  Resp 18  SpO2 100%  LMP 11/13/2011  Physical Exam  Nursing note and vitals reviewed. Constitutional: She is oriented to person, place, and time. She appears well-developed and well-nourished. No distress.  HENT:  Head: Normocephalic and atraumatic.  Right Ear: External ear normal.  Left Ear: External ear normal.  Nose: Nose normal.  Mouth/Throat: Oropharynx is clear and moist. No oropharyngeal exudate.  Eyes: Conjunctivae are normal. Pupils are equal, round, and reactive to light. No scleral icterus.  Neck: Normal range of motion. Neck supple.  Cardiovascular: Normal rate, regular rhythm and normal heart sounds.  Exam reveals no gallop and no friction rub.   No murmur heard. Pulmonary/Chest: Effort normal and breath sounds normal. No respiratory distress. She exhibits no tenderness.  Abdominal: Soft.  Bowel sounds are normal. She exhibits no distension. There is no tenderness.  Musculoskeletal:       Left foot: She exhibits decreased range of motion, tenderness and bony tenderness. She exhibits normal capillary refill and no deformity.       Feet:       Tenderness, erythema to MTP joint  Lymphadenopathy:    She has no cervical adenopathy.  Neurological: She is alert and  oriented to person, place, and time. No cranial nerve deficit.  Skin: Skin is warm and dry. No rash noted. There is erythema. No pallor.  Psychiatric: She has a normal mood and affect. Her behavior is normal. Judgment and thought content normal.    ED Course  Procedures (including critical care time)  Labs Reviewed - No data to display No results found.   Gout    MDM  Patient with obvious signs of gout - will start on anti-inflammatory and short course of pain medication - also noted to be hypertensive but I suspect this to be related to pain. - reports that she is taking her medication as directed.        Idalia Needle [place], [place] 11/15/11 2215

## 2011-11-15 NOTE — ED Notes (Addendum)
Per pt: pain in left great toe that began yesterday and is worse with ambulation. Not preceded by any known injury, no hx of similar symptoms

## 2011-11-15 NOTE — ED Notes (Signed)
MD at bedside. 

## 2012-04-16 ENCOUNTER — Emergency Department (HOSPITAL_COMMUNITY)
Admission: EM | Admit: 2012-04-16 | Discharge: 2012-04-17 | Disposition: A | Discharge status: Home / Self Care | Payer: Self-pay | Attending: Emergency Medicine | Admitting: Emergency Medicine

## 2012-04-16 ENCOUNTER — Encounter (HOSPITAL_COMMUNITY): Payer: Self-pay | Admitting: *Deleted

## 2012-04-16 DIAGNOSIS — L02419 Cutaneous abscess of limb, unspecified: Secondary | ICD-10-CM | POA: Insufficient documentation

## 2012-04-16 DIAGNOSIS — L0291 Cutaneous abscess, unspecified: Secondary | ICD-10-CM

## 2012-04-16 DIAGNOSIS — F17200 Nicotine dependence, unspecified, uncomplicated: Secondary | ICD-10-CM | POA: Insufficient documentation

## 2012-04-16 DIAGNOSIS — L0390 Cellulitis, unspecified: Secondary | ICD-10-CM

## 2012-04-16 DIAGNOSIS — I10 Essential (primary) hypertension: Secondary | ICD-10-CM | POA: Insufficient documentation

## 2012-04-16 HISTORY — DX: Essential (primary) hypertension: I10

## 2012-04-16 NOTE — ED Notes (Signed)
Pt presents to the ED with a raised bump to her right anterior lower leg. Spot is red, warm, and tender. Pt denies n/v/d, fever, chills.

## 2012-04-17 MED ORDER — DOXYCYCLINE HYCLATE 100 MG PO CAPS: 100.0000 mg | ORAL_CAPSULE | Freq: Two times a day (BID) | ORAL | Status: DC

## 2012-04-17 MED ORDER — OXYCODONE-ACETAMINOPHEN 5-325 MG PO TABS: 2.0000 | ORAL_TABLET | ORAL | Status: DC | PRN

## 2012-04-17 NOTE — ED Provider Notes (Signed)
Medical screening examination/treatment/procedure(s) were conducted as a shared visit with non-physician practitioner(s) and myself.  I personally evaluated the patient during the encounter  Abscess with cellulitis. I and D at the bedside. Home with abx. ED follow up in 48 hours if not improving  Hoy Morn, MD 04/17/12 [phone]

## 2012-04-17 NOTE — ED Provider Notes (Signed)
History     CSN: [id number]  Arrival date & time 04/16/12  2212   First MD Initiated Contact with Patient 04/16/12 2326      Chief Complaint  Patient presents with  . Abscess    right leg    (Consider location/radiation/quality/duration/timing/severity/associated sxs/prior treatment) HPI Comments: Melody [name] 36 y.o. female   The chief complaint is: Patient presents with:   Abscess - right leg    Patient patient presents today for chief complaint of right leg pain and swelling. Onset of pain was approximately one week ago she states the pain is increased that a recent onset of symptoms. She describes the pain as throbbing. She is a history of abscess and cellulitis she was hospitalized on her buttocks. Denies any fevers chills nausea vomiting sweats, abdominal pain, diarrhea, constipation. Has any chest pain or shortness of breath.  Patient is a 36 y.o. female presenting with abscess. The history is provided by the patient. No language interpreter was used.  Abscess  Pertinent negatives include no fever, no diarrhea and no vomiting.    Past Medical History  Diagnosis Date  . Ectopic pregnancy   . Hypertension     Past Surgical History  Procedure Date  . Dilation and curettage of uterus   . Cesarean section     No family history on file.  History  Substance Use Topics  . Smoking status: Current Everyday Smoker -- 0.5 packs/day    Types: Cigars  . Smokeless tobacco: Never Used  . Alcohol Use: 4.5 oz/week    9 drink(s) per week    OB History    Grav Para Term Preterm Abortions TAB SAB Ect Mult Living                  Review of Systems  Constitutional: Negative for fever and chills.  Respiratory: Negative for shortness of breath.   Cardiovascular: Negative for chest pain.  Gastrointestinal: Negative for nausea, vomiting, abdominal pain and diarrhea.  Genitourinary: Negative for dysuria.  Musculoskeletal: Negative for arthralgias.    Allergies    Review of patient's allergies indicates no known allergies.  Home Medications   Current Outpatient Rx  Name Route Sig Dispense Refill  . DOXYCYCLINE HYCLATE 100 MG PO CAPS Oral Take 1 capsule (100 mg total) by mouth 2 (two) times daily. 20 capsule 0  . OXYCODONE-ACETAMINOPHEN 5-325 MG PO TABS Oral Take 2 tablets by mouth every 4 (four) hours as needed for pain. 6 tablet 0    BP 155/113  Pulse 56  Temp 98.9 F (37.2 C) (Oral)  Resp 18  SpO2 97%  LMP 04/13/2012  Physical Exam  Constitutional: She is oriented to person, place, and time. She appears well-developed and well-nourished. No distress.  HENT:  Head: Normocephalic and atraumatic.  Eyes: Conjunctivae are normal. No scleral icterus.  Neck: Normal range of motion.  Cardiovascular: Normal rate, regular rhythm and normal heart sounds.  Exam reveals no gallop and no friction rub.   No murmur heard. Pulmonary/Chest: Effort normal and breath sounds normal. No respiratory distress.  Abdominal: Soft. Bowel sounds are normal. She exhibits no distension and no mass. There is no tenderness. There is no guarding.  Neurological: She is alert and oriented to person, place, and time.  Skin: Skin is warm and dry. She is not diaphoretic.       There is a area of erythema and swelling approximately 6 cm x 4 cm in the medial anterior lower extremity there is  small 1 cm area of fluctuance. It is warm to the touch.  extremely tender to palpation appear. No discharge noted.    ED Course  INCISION AND DRAINAGE Date/Time: 04/17/2012 1:23 AM Performed by: Margarita Mail Authorized by: Margarita Mail Consent: Verbal consent obtained. Risks and benefits: risks, benefits and alternatives were discussed Consent given by: patient Type: abscess Anesthesia: local infiltration Local anesthetic: lidocaine 2% with epinephrine Anesthetic total: 4 ml Scalpel size: 11 Incision type: single straight Drainage: purulent Drainage amount: scant Wound  treatment: wound left open Packing material: 1/4 in iodoform gauze Patient tolerance: Patient tolerated the procedure well with no immediate complications.   (including critical care time)  Labs Reviewed - No data to display No results found.  Patient with hypertension. She's not taking medications at this time and she is seeking Medicaid coverage. I will discharge the patient with doxycycline 10 days twice a day. Also have instructed her to remove the packing from her leg cavity and approximately 48 hours as discussed with Dr. Venora Maples. He agrees with that plan. I have also instructed the patient that she should avoid direct sunlight for the next 10 days. I discussed immediate recent history current. All questions answered thoroughly. She agrees with plan. Safe for discharge. 1. Abscess   2. Cellulitis       MDM  Discharge the patient. Discussed reasons to seek immediate care. Patient expresses understanding and agrees with plan.         Margarita Mail, PA-C 04/17/12 0126

## 2012-04-21 ENCOUNTER — Emergency Department (HOSPITAL_COMMUNITY)
Admission: EM | Admit: 2012-04-21 | Discharge: 2012-04-21 | Disposition: A | Discharge status: Home / Self Care | Payer: Self-pay | Attending: Emergency Medicine | Admitting: Emergency Medicine

## 2012-04-21 DIAGNOSIS — L0390 Cellulitis, unspecified: Secondary | ICD-10-CM

## 2012-04-21 DIAGNOSIS — L02419 Cutaneous abscess of limb, unspecified: Secondary | ICD-10-CM | POA: Insufficient documentation

## 2012-04-21 DIAGNOSIS — F17200 Nicotine dependence, unspecified, uncomplicated: Secondary | ICD-10-CM | POA: Insufficient documentation

## 2012-04-21 DIAGNOSIS — I10 Essential (primary) hypertension: Secondary | ICD-10-CM | POA: Insufficient documentation

## 2012-04-21 NOTE — ED Notes (Signed)
Pt with abscess to right lower leg, seen here one week ago.  An I&D was done and packing removed by patient two days after placement.  Pt was supposed to come in for recheck, but she was not sure when.  Returns tonight with increased swelling, pain, and small amount of pinkish drainage.

## 2012-04-21 NOTE — ED Provider Notes (Signed)
History     CSN: [id number]  Arrival date & time 04/21/12  1954   First MD Initiated Contact with Patient 04/21/12 2058      Chief Complaint  Patient presents with  . Abscess    (Consider location/radiation/quality/duration/timing/severity/associated sxs/prior treatment) HPI Comments: Patient presents today with a chief complaint of an abscess to her RLE.  She was evaluated for this in the ED on 04/16/12 and an incision and drainage was done at that time.  The abscess was packed and she removed the packing on 04/18/12.  She was then started on Doxycycline 100mg  BID, which she has been taking as prescribed.  She feels that the erythema and tenderness around the abscess has improved significantly.  She has noticed a small amount of drainage from the abscess earlier today.  She states that the drainage was clear in color.  She continues to have swelling around the abscess.  No fever or chills.  She is not Diabetic.  Patient is a 36 y.o. female presenting with abscess. The history is provided by the patient.  Abscess  Pertinent negatives include no fever.    Past Medical History  Diagnosis Date  . Ectopic pregnancy   . Hypertension     Past Surgical History  Procedure Date  . Dilation and curettage of uterus   . Cesarean section     No family history on file.  History  Substance Use Topics  . Smoking status: Current Everyday Smoker -- 0.5 packs/day    Types: Cigars  . Smokeless tobacco: Never Used  . Alcohol Use: 4.5 oz/week    9 drink(s) per week    OB History    Grav Para Term Preterm Abortions TAB SAB Ect Mult Living                  Review of Systems  Constitutional: Negative for fever and chills.  Neurological: Negative for numbness.  All other systems reviewed and are negative.    Allergies  Review of patient's allergies indicates no known allergies.  Home Medications   Current Outpatient Rx  Name Route Sig Dispense Refill  . DOXYCYCLINE HYCLATE 100 MG  PO CAPS Oral Take 100 mg by mouth 2 (two) times daily.    . OXYCODONE-ACETAMINOPHEN 5-325 MG PO TABS Oral Take 2 tablets by mouth every 4 (four) hours as needed. Pain      BP 154/110  Pulse 63  Temp 98.3 F (36.8 C) (Oral)  Resp 20  Wt 157 lb (71.215 kg)  SpO2 100%  LMP 04/13/2012  Physical Exam  Nursing note and vitals reviewed. Constitutional: She appears well-developed and well-nourished.  HENT:  Head: Normocephalic and atraumatic.  Cardiovascular: Normal rate, regular rhythm and normal heart sounds.   Pulses:      Dorsalis pedis pulses are 2+ on the right side, and 2+ on the left side.  Pulmonary/Chest: Effort normal and breath sounds normal.  Neurological: She is alert. No sensory deficit. Gait normal.  Skin: Skin is warm and dry.     Psychiatric: She has a normal mood and affect.    ED Course  Procedures (including critical care time)  Labs Reviewed - No data to display No results found.   No diagnosis found.    MDM  Patient presenting for recheck of an abscess that was incised and drained 5 days ago.  She is currently on Doxycycline 100 mg BID.  She reports that the erythema and tenderness surrounding the abscess is improving.  Patient afebrile.  Patient instructed to continue taking antibiotic and follow up with PCP if needed.        Sherlyn Lees Cam[place], PA-C 04/22/12 0104

## 2012-04-22 NOTE — ED Provider Notes (Signed)
Medical screening examination/treatment/procedure(s) were performed by non-physician practitioner and as supervising physician I was immediately available for consultation/collaboration.  Rhunette Croft, M.D.      Rhunette Croft, MD 04/22/12 1227

## 2012-12-23 ENCOUNTER — Encounter: Payer: Self-pay | Admitting: Medical

## 2012-12-28 ENCOUNTER — Emergency Department (HOSPITAL_COMMUNITY)
Admission: EM | Admit: 2012-12-28 | Discharge: 2012-12-29 | Disposition: A | Discharge status: Home / Self Care | Payer: Self-pay | Attending: Emergency Medicine | Admitting: Emergency Medicine

## 2012-12-28 ENCOUNTER — Encounter (HOSPITAL_COMMUNITY): Payer: Self-pay | Admitting: *Deleted

## 2012-12-28 ENCOUNTER — Emergency Department (HOSPITAL_COMMUNITY): Payer: Self-pay

## 2012-12-28 DIAGNOSIS — I4581 Long QT syndrome: Secondary | ICD-10-CM | POA: Insufficient documentation

## 2012-12-28 DIAGNOSIS — F1210 Cannabis abuse, uncomplicated: Secondary | ICD-10-CM | POA: Insufficient documentation

## 2012-12-28 DIAGNOSIS — D72829 Elevated white blood cell count, unspecified: Secondary | ICD-10-CM | POA: Insufficient documentation

## 2012-12-28 DIAGNOSIS — R112 Nausea with vomiting, unspecified: Secondary | ICD-10-CM | POA: Insufficient documentation

## 2012-12-28 DIAGNOSIS — F17200 Nicotine dependence, unspecified, uncomplicated: Secondary | ICD-10-CM | POA: Insufficient documentation

## 2012-12-28 DIAGNOSIS — Z3202 Encounter for pregnancy test, result negative: Secondary | ICD-10-CM | POA: Insufficient documentation

## 2012-12-28 DIAGNOSIS — I10 Essential (primary) hypertension: Secondary | ICD-10-CM | POA: Insufficient documentation

## 2012-12-28 DIAGNOSIS — R9431 Abnormal electrocardiogram [ECG] [EKG]: Secondary | ICD-10-CM

## 2012-12-28 LAB — LIPASE, BLOOD: Lipase: 18 U/L (ref 11–59)

## 2012-12-28 LAB — URINE MICROSCOPIC-ADD ON

## 2012-12-28 LAB — URINALYSIS, ROUTINE W REFLEX MICROSCOPIC
Bilirubin Urine: NEGATIVE
Glucose, UA: NEGATIVE mg/dL
Ketones, ur: >80 mg/dL — AB
Protein, ur: 30 mg/dL — AB
pH: 6 (ref 5.0–8.0)

## 2012-12-28 LAB — TROPONIN I: Troponin I: <0.3 ng/mL (ref <0.30)

## 2012-12-28 LAB — CBC
HCT: 41 % (ref 36.0–46.0)
MCH: 33.1 pg (ref 26.0–34.0)
MCV: 91.1 fL (ref 78.0–100.0)
Platelets: 260 10*3/uL (ref 150–400)
RDW: 12.8 % (ref 11.5–15.5)
WBC: 17.6 10*3/uL — ABNORMAL HIGH (ref 4.0–10.5)

## 2012-12-28 LAB — GLUCOSE, CAPILLARY

## 2012-12-28 LAB — BASIC METABOLIC PANEL
BUN: 10 mg/dL (ref 6–23)
CO2: 21 mEq/L (ref 19–32)
Calcium: 10.2 mg/dL (ref 8.4–10.5)
Chloride: 104 mEq/L (ref 96–112)
Creatinine, Ser: 0.76 mg/dL (ref 0.50–1.10)
GFR calc Af Amer: >90 mL/min (ref >90)

## 2012-12-28 LAB — POCT PREGNANCY, URINE: Preg Test, Ur: NEGATIVE

## 2012-12-28 MED ORDER — DICYCLOMINE HCL 10 MG/ML IM SOLN
20.0000 mg | Freq: Once | INTRAMUSCULAR | Status: AC
  Administered 2012-12-28: 20 mg via INTRAMUSCULAR
  Filled 2012-12-28: qty 2

## 2012-12-28 MED ORDER — SODIUM CHLORIDE 0.9 % IV BOLUS (SEPSIS)
1000.0000 mL | Freq: Once | INTRAVENOUS | Status: AC
  Administered 2012-12-28: 1000 mL via INTRAVENOUS

## 2012-12-28 MED ORDER — FAMOTIDINE IN NACL 20-0.9 MG/50ML-% IV SOLN
20.0000 mg | Freq: Once | INTRAVENOUS | Status: AC
  Administered 2012-12-28: 20 mg via INTRAVENOUS
  Filled 2012-12-28: qty 50

## 2012-12-28 MED ORDER — PROMETHAZINE HCL 25 MG/ML IJ SOLN
25.0000 mg | Freq: Once | INTRAMUSCULAR | Status: DC
  Filled 2012-12-28: qty 1

## 2012-12-28 MED ORDER — GI COCKTAIL ~~LOC~~
30.0000 mL | Freq: Once | ORAL | Status: AC
  Administered 2012-12-28: 30 mL via ORAL
  Filled 2012-12-28: qty 30

## 2012-12-28 MED ORDER — OXYCODONE-ACETAMINOPHEN 5-325 MG PO TABS
1.0000 | ORAL_TABLET | Freq: Once | ORAL | Status: AC
  Administered 2012-12-28: 1 via ORAL
  Filled 2012-12-28: qty 1

## 2012-12-28 MED ORDER — PROMETHAZINE HCL 25 MG/ML IJ SOLN
12.5000 mg | Freq: Once | INTRAMUSCULAR | Status: AC
  Administered 2012-12-28: 12.5 mg via INTRAVENOUS
  Filled 2012-12-28: qty 1

## 2012-12-28 MED ORDER — LORAZEPAM 2 MG/ML IJ SOLN
1.0000 mg | Freq: Once | INTRAMUSCULAR | Status: AC
  Administered 2012-12-28: 1 mg via INTRAVENOUS
  Filled 2012-12-28: qty 1

## 2012-12-28 MED ORDER — ONDANSETRON HCL 4 MG/2ML IJ SOLN
4.0000 mg | Freq: Once | INTRAMUSCULAR | Status: AC
  Administered 2012-12-28: 4 mg via INTRAVENOUS
  Filled 2012-12-28: qty 2

## 2012-12-28 NOTE — ED Provider Notes (Signed)
History     CSN: [id number]  Arrival date & time 12/28/12  1859   First MD Initiated Contact with Patient 12/28/12 1911      Chief Complaint  Patient presents with  . Hypertension  . reports hangover today, "doesn't feel right"   . shob/hyperventilating     (Consider location/radiation/quality/duration/timing/severity/associated sxs/prior treatment) HPI  Melody [name] is a 37 y.o. female complaining of 5-10 episodes of nonbloody nonbilious, no coffee ground emesis starting this a.m. Patient states that she went out drinking and smoked marijuana last night. Patient also states that she took her blood pressure at home and a significantly elevated so she chose to come to the ED for further evaluation. Patient has been noncompliant with her hypertensive medications for 2 months because her Medicaid is nonfunctional.  After multiple episodes of vomiting patient also states that she had a numbness and feeling of contractions the hands. She denies fever, chest pain, shortness of breath, headache, change in vision, dysphagia, change in bowel or bladder habits. Patient does state that she recently had a URI.   Past Medical History  Diagnosis Date  . Ectopic pregnancy   . Hypertension     Past Surgical History  Procedure Laterality Date  . Dilation and curettage of uterus    . Cesarean section      History reviewed. No pertinent family history.  History  Substance Use Topics  . Smoking status: Current Every Day Smoker -- 0.50 packs/day    Types: Cigars  . Smokeless tobacco: Never Used  . Alcohol Use: 4.5 oz/week    9 drink(s) per week    OB History   Grav Para Term Preterm Abortions TAB SAB Ect Mult Living                  Review of Systems  Constitutional: Negative for fever.  Respiratory: Negative for shortness of breath.   Cardiovascular: Negative for chest pain.  Gastrointestinal: Positive for nausea and vomiting. Negative for abdominal pain and diarrhea.  All  other systems reviewed and are negative.    Allergies  Review of patient's allergies indicates no known allergies.  Home Medications   Current Outpatient Rx  Name  Route  Sig  Dispense  Refill  . OVER THE COUNTER MEDICATION      2 tablets once.           BP 207/109  Pulse 74  Temp(Src) 98.2 F (36.8 C) (Oral)  Resp 20  SpO2 100%  Physical Exam  Nursing note and vitals reviewed. Constitutional: She is oriented to person, place, and time. She appears well-developed and well-nourished. No distress.  HENT:  Head: Normocephalic.  Mouth/Throat: Oropharynx is clear and moist.  Eyes: Conjunctivae and EOM are normal. Pupils are equal, round, and reactive to light.  Cardiovascular: Normal rate.   Irregular rate  Pulmonary/Chest: Effort normal. No stridor.  Abdominal: Soft. Bowel sounds are normal. She exhibits no distension and no mass. There is tenderness. There is no rebound and no guarding.  Is tender to palpation but no rebound  Musculoskeletal: Normal range of motion.  Neurological: She is alert and oriented to person, place, and time.  Psychiatric: She has a normal mood and affect.    ED Course  Procedures (including critical care time)  Labs Reviewed  CBC - Abnormal; Notable for the following:    WBC 17.6 (*)    MCHC 36.3 (*)    All other components within normal limits  BASIC METABOLIC PANEL -  Abnormal; Notable for the following:    Glucose, Bld 121 (*)    All other components within normal limits  URINALYSIS, ROUTINE W REFLEX MICROSCOPIC - Abnormal; Notable for the following:    Ketones, ur >80 (*)    Protein, ur 30 (*)    Leukocytes, UA SMALL (*)    All other components within normal limits  GLUCOSE, CAPILLARY - Abnormal; Notable for the following:    Glucose-Capillary 119 (*)    All other components within normal limits  URINE MICROSCOPIC-ADD ON - Abnormal; Notable for the following:    Squamous Epithelial / LPF FEW (*)    Bacteria, UA FEW (*)    All  other components within normal limits  URINE CULTURE  TROPONIN I  LIPASE, BLOOD  POCT PREGNANCY, URINE   Dg Chest 2 View  12/28/2012  *RADIOLOGY REPORT*  Clinical Data: Short of breath  CHEST - 2 VIEW  Comparison: Chest radiograph 11/28/2004  Findings: Normal mediastinum and cardiac silhouette.  Normal pulmonary  vasculature.  No evidence of effusion, infiltrate, or pneumothorax.  No acute bony abnormality.  IMPRESSION: Normal chest radiograph   Original Report Authenticated By: Suzy Bouchard, M.D.     Date: 12/28/2012  Rate: 75  Rhythm: normal sinus rhythm  QRS Axis: normal  Intervals: QT prolonged  ST/T Wave abnormalities: normal  Conduction Disutrbances:none  Narrative Interpretation:   Old EKG Reviewed: none available and unchanged  10:19 PM reports resolution of her chest pain, she does state that she still feels pain in the abdomen. Abdominal exam remains benign with no guarding or rebound.  1. Nausea & vomiting   2. Prolonged Q-T interval on ECG   3. Marijuana abuse   4. Hypertension, uncontrolled   5. Leukocytosis       MDM   Melody [name] is a 37 y.o. female with extremely elevated blood pressure at 204/129 and multiple episodes of nausea and vomiting which may be secondary to alcohol or marijuana consumption. Patient's EKG shows a prolonged chief my exam she has an irregular heartbeat. She has developed chest pain and "a feeling of discomfort in her chest at approximately 8:40 PM.   She has a significant leukocytosis of 17.6. She states that she recently has a cold, she denies any dysuria or frequency.  After Phenergan is given patient's pastor by mouth challenge. She is amenable to discharge at this time.  Filed Vitals:   12/28/12 1907 12/28/12 1930  BP: 207/109 204/129  Pulse: 74   Temp: 98.2 F (36.8 C) 98.1 F (36.7 C)  TempSrc: Oral Oral  Resp: 20 21  SpO2: 100% 100%    VSS and patient is appropriate for, and amenable to, discharge at this time.  Pt verbalized understanding and agrees with care plan. Outpatient follow-up and return precautions given.          Monico Blitz, PA-C 12/29/12 [phone]
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
## 2012-12-28 NOTE — ED Notes (Signed)
Pt reports hx of HTN. Pt drank ETOH and smoked marijuana last night. Reports having a hangover this morning and then said she did not feel right. Pt reports she has not taken BP meds in several months. At present pt hyperventilating with contracted hands. rn encouraged pt to breathe slow deep breaths. Pt denies pain at present.

## 2012-12-29 MED ORDER — LISINOPRIL 10 MG PO TABS: 10.0000 mg | ORAL_TABLET | Freq: Every day | ORAL | Status: DC

## 2012-12-29 MED ORDER — PROMETHAZINE HCL 25 MG PO TABS: 25.0000 mg | ORAL_TABLET | Freq: Four times a day (QID) | ORAL | Status: DC | PRN

## 2012-12-30 LAB — URINE CULTURE

## 2012-12-30 NOTE — ED Provider Notes (Signed)
  Medical screening examination/treatment/procedure(s) were performed by non-physician practitioner and as supervising physician I was immediately available for consultation/collaboration.    Carmin Muskrat, MD 12/30/12 [phone]

## 2012-12-31 ENCOUNTER — Telehealth (HOSPITAL_COMMUNITY): Payer: Self-pay | Admitting: Emergency Medicine

## 2012-12-31 NOTE — ED Notes (Signed)
Post ED Visit - Positive Culture Follow-up  Culture report reviewed by antimicrobial stewardship pharmacist: []  [place], Pharm.D., BCPS [x]  Heide Guile, Pharm.D., BCPS []  Alycia Rossetti, Pharm.D., BCPS []  [place], [place].D., BCPS, AAHIVP []  Legrand [place], Pharm.D., BCPS, AAHIV  Positive urine culture No abx needed  Kylie A Holland 12/31/2012, 2:08 PM

## 2014-03-14 ENCOUNTER — Encounter (HOSPITAL_COMMUNITY): Payer: Self-pay | Admitting: Emergency Medicine

## 2014-03-14 ENCOUNTER — Emergency Department (HOSPITAL_COMMUNITY)
Admission: EM | Admit: 2014-03-14 | Discharge: 2014-03-15 | Disposition: A | Discharge status: Home / Self Care | Payer: Self-pay | Attending: Emergency Medicine | Admitting: Emergency Medicine

## 2014-03-14 ENCOUNTER — Emergency Department (HOSPITAL_COMMUNITY): Payer: Self-pay

## 2014-03-14 DIAGNOSIS — G4489 Other headache syndrome: Secondary | ICD-10-CM | POA: Insufficient documentation

## 2014-03-14 DIAGNOSIS — M542 Cervicalgia: Secondary | ICD-10-CM | POA: Insufficient documentation

## 2014-03-14 DIAGNOSIS — Z79899 Other long term (current) drug therapy: Secondary | ICD-10-CM | POA: Insufficient documentation

## 2014-03-14 DIAGNOSIS — Z3202 Encounter for pregnancy test, result negative: Secondary | ICD-10-CM | POA: Insufficient documentation

## 2014-03-14 DIAGNOSIS — I10 Essential (primary) hypertension: Secondary | ICD-10-CM | POA: Insufficient documentation

## 2014-03-14 DIAGNOSIS — F17200 Nicotine dependence, unspecified, uncomplicated: Secondary | ICD-10-CM | POA: Insufficient documentation

## 2014-03-14 LAB — CBC WITH DIFFERENTIAL/PLATELET
BASOS ABS: 0 10*3/uL (ref 0.0–0.1)
Basophils Relative: 0 % (ref 0–1)
EOS PCT: 1 % (ref 0–5)
Eosinophils Absolute: 0.1 10*3/uL (ref 0.0–0.7)
HCT: 37.6 % (ref 36.0–46.0)
Hemoglobin: 13.5 g/dL (ref 12.0–15.0)
LYMPHS PCT: 24 % (ref 12–46)
Lymphs Abs: 2.8 10*3/uL (ref 0.7–4.0)
MCH: 33.3 pg (ref 26.0–34.0)
MCHC: 35.9 g/dL (ref 30.0–36.0)
MCV: 92.8 fL (ref 78.0–100.0)
Monocytes Absolute: 0.8 10*3/uL (ref 0.1–1.0)
Monocytes Relative: 7 % (ref 3–12)
NEUTROS PCT: 69 % (ref 43–77)
Neutro Abs: 8.1 10*3/uL — ABNORMAL HIGH (ref 1.7–7.7)
PLATELETS: 157 10*3/uL (ref 150–400)
RBC: 4.05 MIL/uL (ref 3.87–5.11)
RDW: 12.7 % (ref 11.5–15.5)
WBC: 11.8 10*3/uL — ABNORMAL HIGH (ref 4.0–10.5)

## 2014-03-14 LAB — BASIC METABOLIC PANEL
ANION GAP: 15 (ref 5–15)
BUN: 23 mg/dL (ref 6–23)
CO2: 24 mEq/L (ref 19–32)
Calcium: 10 mg/dL (ref 8.4–10.5)
Chloride: 99 mEq/L (ref 96–112)
Creatinine, Ser: 1.69 mg/dL — ABNORMAL HIGH (ref 0.50–1.10)
GFR calc Af Amer: 43 mL/min — ABNORMAL LOW (ref >90)
GFR, EST NON AFRICAN AMERICAN: 37 mL/min — AB (ref >90)
Glucose, Bld: 124 mg/dL — ABNORMAL HIGH (ref 70–99)
POTASSIUM: 3 meq/L — AB (ref 3.7–5.3)
SODIUM: 138 meq/L (ref 137–147)

## 2014-03-14 MED ORDER — DIPHENHYDRAMINE HCL 50 MG/ML IJ SOLN
25.0000 mg | Freq: Once | INTRAMUSCULAR | Status: AC
  Administered 2014-03-14: 25 mg via INTRAVENOUS
  Filled 2014-03-14: qty 1

## 2014-03-14 MED ORDER — METOCLOPRAMIDE HCL 5 MG/ML IJ SOLN
10.0000 mg | Freq: Once | INTRAMUSCULAR | Status: AC
  Administered 2014-03-14: 10 mg via INTRAVENOUS
  Filled 2014-03-14: qty 2

## 2014-03-14 MED ORDER — LABETALOL HCL 5 MG/ML IV SOLN
20.0000 mg | Freq: Once | INTRAVENOUS | Status: AC
  Administered 2014-03-14: 20 mg via INTRAVENOUS
  Filled 2014-03-14: qty 4

## 2014-03-14 NOTE — ED Provider Notes (Signed)
CSN: [id number]     Arrival date & time 03/14/14  2121 History   First MD Initiated Contact with Patient 03/14/14 2153     Chief Complaint  Patient presents with  . Hypertension     (Consider location/radiation/quality/duration/timing/severity/associated sxs/prior Treatment) HPI 38 year old female presents with a headache that started at 1 PM. Is approximately 8 hours prior to arrival. Patient states the headache started acutely and was gradually getting worse over the next couple hours. Seemed to peak at 4 PM. She was having a throbbing headache, intermittent blurred vision, neck pain, and has a little bit of trouble walking. She felt like she was tripping over her self. The headache is currently a 7/10. Her vision or trouble walking has resolved. Her blood pressure work was 226/150. She's not been on blood pressure medicines for at least 2 months due to insurance reasons. She was last on lisinopril, which seemed to been started in the ER a year ago. The patient denies any nausea or vomiting. No fevers. She's been checking her blood pressure every couple weeks at home and for the last several months it has been 180/90.  Past Medical History  Diagnosis Date  . Ectopic pregnancy   . Hypertension    Past Surgical History  Procedure Laterality Date  . Dilation and curettage of uterus    . Cesarean section     Family History  Problem Relation Age of Onset  . CAD Mother   . CAD Brother   . Hypertension Other   . Kidney failure Other    History  Substance Use Topics  . Smoking status: Current Every Day Smoker -- 0.50 packs/day    Types: Cigars, Cigarettes  . Smokeless tobacco: Never Used  . Alcohol Use: 4.5 oz/week    9 drink(s) per week     Comment: occ   OB History   Grav Para Term Preterm Abortions TAB SAB Ect Mult Living                 Review of Systems  Constitutional: Negative for fever.  Eyes: Positive for visual disturbance.  Gastrointestinal: Negative for vomiting  and abdominal pain.  Musculoskeletal: Positive for gait problem and neck pain.  Neurological: Positive for headaches. Negative for weakness and numbness.  All other systems reviewed and are negative.     Allergies  Review of patient's allergies indicates no known allergies.  Home Medications   Prior to Admission medications   Medication Sig Start Date End Date Taking? Authorizing Provider  acetaminophen (TYLENOL) 500 MG tablet Take 1,000 mg by mouth every 6 (six) hours as needed for headache.   Yes Historical Provider, MD  lisinopril (PRINIVIL,ZESTRIL) 10 MG tablet Take 1 tablet (10 mg total) by mouth daily. 12/29/12  Yes Nicole Pisciotta, PA-C   BP 224/142  Pulse 98  Temp(Src) 99.2 F (37.3 C) (Oral)  Resp 18  SpO2 100%  LMP 02/15/2014 Physical Exam  Nursing note and vitals reviewed. Constitutional: She is oriented to person, place, and time. She appears well-developed and well-nourished. No distress.  HENT:  Head: Normocephalic and atraumatic.  Right Ear: External ear normal.  Left Ear: External ear normal.  Nose: Nose normal.  Eyes: EOM are normal. Pupils are equal, round, and reactive to light. Right eye exhibits no discharge. Left eye exhibits no discharge.  Cardiovascular: Normal rate, regular rhythm and normal heart sounds.   Pulmonary/Chest: Effort normal and breath sounds normal.  Abdominal: Soft. There is no tenderness.  Neurological: She is  alert and oriented to person, place, and time.  Reflex Scores:      Bicep reflexes are 2+ on the right side and 2+ on the left side.      Patellar reflexes are 2+ on the right side and 2+ on the left side. CN 2-12 grossly intact. 5/5 strength in all 4 extremities. Normal finger to nose, RAM and heel to shin. Normal gait.  Skin: Skin is warm and dry.    ED Course  LUMBAR PUNCTURE Date/Time: 03/15/2014 12:17 AM Performed by: Sherwood Gambler T Authorized by: Sherwood Gambler T Consent: Verbal consent obtained. written consent  obtained. Risks and benefits: risks, benefits and alternatives were discussed Consent given by: patient Indications: evaluation for subarachnoid hemorrhage Local anesthetic: lidocaine 1% with epinephrine Anesthetic total: 2 ml Patient sedated: no Preparation: Patient was prepped and draped in the usual sterile fashion. Lumbar space: L4-L5 interspace Patient's position: sitting Needle gauge: 20 Number of attempts: 1 Fluid appearance: clear Tubes of fluid: 4 Total volume: 4 ml Post-procedure: site cleaned and adhesive bandage applied Patient tolerance: Patient tolerated the procedure well with no immediate complications.   (including critical care time) Labs Review Labs Reviewed  CBC WITH DIFFERENTIAL - Abnormal; Notable for the following:    WBC 11.8 (*)    Neutro Abs 8.1 (*)    All other components within normal limits  BASIC METABOLIC PANEL - Abnormal; Notable for the following:    Potassium 3.0 (*)    Glucose, Bld 124 (*)    Creatinine, Ser 1.69 (*)    GFR calc non Af Amer 37 (*)    GFR calc Af Amer 43 (*)    All other components within normal limits  CSF CULTURE  GRAM STAIN  URINALYSIS, ROUTINE W REFLEX MICROSCOPIC  CSF CELL COUNT WITH DIFFERENTIAL  CSF CELL COUNT WITH DIFFERENTIAL  GLUCOSE, CSF  PROTEIN, CSF    Imaging Review Ct Head Wo Contrast  03/14/2014   CLINICAL DATA:  Posterior headache.  Hypertension.  EXAM: CT HEAD WITHOUT CONTRAST  TECHNIQUE: Contiguous axial images were obtained from the base of the skull through the vertex without intravenous contrast.  COMPARISON:  None.  FINDINGS: There is no evidence of acute infarction, mass lesion, or intra- or extra-axial hemorrhage on CT.  The posterior fossa, including the cerebellum, brainstem and fourth ventricle, is within normal limits. The third and lateral ventricles, and basal ganglia are unremarkable in appearance. The cerebral hemispheres are symmetric in appearance, with normal gray-white differentiation.  No mass effect or midline shift is seen.  There is no evidence of fracture; visualized osseous structures are unremarkable in appearance. The visualized portions of the orbits are within normal limits. The paranasal sinuses and mastoid air cells are well-aerated. No significant soft tissue abnormalities are seen.  IMPRESSION: Unremarkable noncontrast CT of the head.   Electronically Signed   By: Garald Balding M.D.   On: 03/14/2014 23:37     EKG Interpretation None      MDM   Final diagnoses:  Essential hypertension    Patient's headache is not classic for [facility], but given her neuro complaints with sudden headache a CT and LP were performed to r/o sentinel bleed. Her headache and HTN improved in ED with labetalol and reglan. Given her increased in Cr compared to last year, she is likely having end organ damage from her poorly controlled HTN. Assuming her CSF shows no evidence of SAH, will need admission to hospitalist for HTN control. Care transferred to Dr.  Campos with CSF pending.    Ephraim Hamburger, MD 03/15/14 [phone]

## 2014-03-14 NOTE — ED Notes (Signed)
Pt states she was at work and got an instant headache, her neck felt stiff, nausea, and blurred vision  Pt states she left work and laid down but didn't feel any better so she checked her B/P and it was 226/150  Pt states she has been off her blood pressure medication for roughly 2 mths

## 2014-03-15 LAB — GRAM STAIN

## 2014-03-15 LAB — GLUCOSE, CSF: Glucose, CSF: 72 mg/dL (ref 43–76)

## 2014-03-15 LAB — URINALYSIS, ROUTINE W REFLEX MICROSCOPIC
Bilirubin Urine: NEGATIVE
Glucose, UA: NEGATIVE mg/dL
KETONES UR: NEGATIVE mg/dL
NITRITE: POSITIVE — AB
PROTEIN: 30 mg/dL — AB
Specific Gravity, Urine: 1.016 (ref 1.005–1.030)
UROBILINOGEN UA: 0.2 mg/dL (ref 0.0–1.0)
pH: 5.5 (ref 5.0–8.0)

## 2014-03-15 LAB — CSF CELL COUNT WITH DIFFERENTIAL
RBC Count, CSF: 1 /mm3 — ABNORMAL HIGH
RBC Count, CSF: 1 /mm3 — ABNORMAL HIGH
Tube #: 1
Tube #: 4
WBC, CSF: 1 /mm3 (ref 0–5)
WBC, CSF: 1 /mm3 (ref 0–5)

## 2014-03-15 LAB — PROTEIN, CSF: Total  Protein, CSF: 34 mg/dL (ref 15–45)

## 2014-03-15 LAB — URINE MICROSCOPIC-ADD ON

## 2014-03-15 LAB — POC URINE PREG, ED: Preg Test, Ur: NEGATIVE

## 2014-03-15 MED ORDER — LISINOPRIL 10 MG PO TABS: 10.0000 mg | ORAL_TABLET | Freq: Every day | ORAL | Status: DC

## 2014-03-15 MED ORDER — HYDROCHLOROTHIAZIDE 25 MG PO TABS: 25.0000 mg | ORAL_TABLET | Freq: Every day | ORAL | Status: DC

## 2014-03-15 NOTE — ED Notes (Signed)
Pt in NAD ,  Pt is awaiting discharge home

## 2014-03-15 NOTE — ED Notes (Signed)
MD at bedside. 

## 2014-03-15 NOTE — ED Provider Notes (Signed)
Best Contact Number [phone])  2:46 AM Pt well appearing. BP is 171/109.  Discharge from ER. Home on lisinopril 10mg  and HCTZ 25mg . Message will be left with case management team to assist follow up at [facility]. Pt reports she can afford these medications as they are on the [facility] $4 list.   Dc home in good condition. Doubt SAH     Ct Head Wo Contrast 03/14/2014   CLINICAL DATA:  Posterior headache.  Hypertension.  EXAM: CT HEAD WITHOUT CONTRAST  TECHNIQUE: Contiguous axial images were obtained from the base of the skull through the vertex without intravenous contrast.  COMPARISON:  None.  FINDINGS: There is no evidence of acute infarction, mass lesion, or intra- or extra-axial hemorrhage on CT.  The posterior fossa, including the cerebellum, brainstem and fourth ventricle, is within normal limits. The third and lateral ventricles, and basal ganglia are unremarkable in appearance. The cerebral hemispheres are symmetric in appearance, with normal gray-white differentiation. No mass effect or midline shift is seen.  There is no evidence of fracture; visualized osseous structures are unremarkable in appearance. The visualized portions of the orbits are within normal limits. The paranasal sinuses and mastoid air cells are well-aerated. No significant soft tissue abnormalities are seen.  IMPRESSION: Unremarkable noncontrast CT of the head.   Electronically Signed   By: Garald Balding M.D.   On: 03/14/2014 23:37   Results for orders placed during the hospital encounter of 03/14/14  [facility] STAIN      Result Value Ref Range   Specimen Description BACK     Special Requests NONE     Gram Stain       Value: WBC PRESENT, PREDOMINANTLY MONONUCLEAR     NO ORGANISMS SEEN     CYTOSPIN     Gram Stain Report Called to,Read Back By and Verified With: A.SHELL,RN AT 0220 ON 03/15/14 BY [facility]   Report Status 03/15/2014 FINAL    CBC WITH DIFFERENTIAL      Result Value Ref Range   WBC 11.8 (*)  4.0 - 10.5 K/uL   RBC 4.05  3.87 - 5.11 MIL/uL   Hemoglobin 13.5  12.0 - 15.0 g/dL   HCT 37.6  36.0 - 46.0 %   MCV 92.8  78.0 - 100.0 fL   MCH 33.3  26.0 - 34.0 pg   MCHC 35.9  30.0 - 36.0 g/dL   RDW 12.7  11.5 - 15.5 %   Platelets 157  150 - 400 K/uL   Neutrophils Relative % 69  43 - 77 %   Neutro Abs 8.1 (*) 1.7 - 7.7 K/uL   Lymphocytes Relative 24  12 - 46 %   Lymphs Abs 2.8  0.7 - 4.0 K/uL   Monocytes Relative 7  3 - 12 %   Monocytes Absolute 0.8  0.1 - 1.0 K/uL   Eosinophils Relative 1  0 - 5 %   Eosinophils Absolute 0.1  0.0 - 0.7 K/uL   Basophils Relative 0  0 - 1 %   Basophils Absolute 0.0  0.0 - 0.1 K/uL  BASIC METABOLIC PANEL      Result Value Ref Range   Sodium 138  137 - 147 mEq/L   Potassium 3.0 (*) 3.7 - 5.3 mEq/L   Chloride 99  96 - 112 mEq/L   CO2 24  19 - 32 mEq/L   Glucose, Bld 124 (*) 70 - 99 mg/dL   BUN 23  6 - 23 mg/dL  Creatinine, Ser 1.69 (*) 0.50 - 1.10 mg/dL   Calcium 10.0  8.4 - 10.5 mg/dL   GFR calc non Af Amer 37 (*) >90 mL/min   GFR calc Af Amer 43 (*) >90 mL/min   Anion gap 15  5 - 15  CSF CELL COUNT WITH DIFFERENTIAL      Result Value Ref Range   Tube # 1     Color, CSF COLORLESS  COLORLESS   Appearance, CSF CLEAR  CLEAR   Supernatant NOT INDICATED     RBC Count, CSF 1 (*) 0 /cu mm   WBC, CSF 1  0 - 5 /cu mm   Other Cells, CSF TOO FEW TO COUNT, SMEAR AVAILABLE FOR REVIEW    CSF CELL COUNT WITH DIFFERENTIAL      Result Value Ref Range   Tube # 4     Color, CSF COLORLESS  COLORLESS   Appearance, CSF CLEAR  CLEAR   Supernatant NOT INDICATED     RBC Count, CSF 1 (*) 0 /cu mm   WBC, CSF 1  0 - 5 /cu mm   Other Cells, CSF TOO FEW TO COUNT, SMEAR AVAILABLE FOR REVIEW    GLUCOSE, CSF      Result Value Ref Range   Glucose, CSF 72  43 - 76 mg/dL  PROTEIN, CSF      Result Value Ref Range   Total  Protein, CSF 34  15 - 45 mg/dL     Hoy Morn, MD 03/15/14 [phone]

## 2014-03-15 NOTE — Progress Notes (Signed)
  CARE MANAGEMENT ED NOTE 03/15/2014  Patient:  Melody [name], Melody [name]   Account Number:  [id number]  Date Initiated:  03/15/2014  Documentation initiated by:  Jackelyn Poling  Subjective/Objective Assessment:   38 yr old self pt seen at [facility] ED on 03/14/14 was at work and got an instant headache, her neck felt stiff, nausea, and blurred vision  Pt states she left work and laid down but didn't feel any better so she checked her B/P and it was 226/150     Subjective/Objective Assessment Detail:   has been off her blood pressure medication for roughly 2 mths      ED CM received a message from [place] from case management office for a message left to assist follow up at [facility]. Pt reports she can afford these medications as they are on the [facility] $4 list.  Pt confirmed with CM she did get her BP Rx filled without issues but still attempting to get a f/u appointment  Agreed for CM to check for a Wednesday pm appt at [facility] to go to [employer] clinic  Pt agreed for cm to return a call to her after 6 pm if appointment available at [facility]  and if not she has agreed to go to [facility] during 04-1029 am walk in hours Monday- Tuesday     Action/Plan:   ED Cm spoke with pt  at [phone] after reviewing EPIC notes, clinicals Cm reviewed a list of self pay providers with pt to assist with her choice of provider services   Action/Plan Detail:   email sent to [facility] staff  -- Vangie Bicker , requesting hospital f/u appt   Anticipated DC Date:  03/14/2014     Status Recommendation to Physician:   Result of Recommendation:    Other ED [place]  Other  PCP issues  Outpatient Services - Pt will follow up  Medication Assistance    Choice offered to / List presented to:            Status of service:  Completed, signed off  ED Comments:   ED Comments Detail:

## 2014-03-16 NOTE — Progress Notes (Addendum)
03/16/2014 [place] with pt to updated her that [facility] is not accepting new patients. Shared with her that [facility] also extended walk in hours to 2-3:30 pm Monday- Thursday also.  Pt agreed for C to also check with [facility] and [facility] medical she again confirmed she preferred appt Wednesdays after noon and preferred not to go to [employer] [facility] clinic 1343 ED CM called [facility] [phone] prompt 1 spoke with Freda Munro who states they are not taking any new pt appointments ED CM had her to check to see if pt was already an established pt and could be seen. Freda Munro checked and found that pt had never been seen  1329 ED CM left a voice message at [phone] prompt 2 [place] to attempt to get a ED f/u appointment for pt prior to 04/03/14 as the pt said [facility] informed her

## 2014-03-17 NOTE — Progress Notes (Signed)
03/17/14 1720 Cm called by Craige Cotta from [facility] to assist with a Wednesday, March 29 2014 1145 appointment for pt. Cm called pt during the call with Santiago Glad and informed her of the appt time/date & need to bring her photo ID. Verification of pt medication list completed (Tylenol 500 mg po prn , HCTZ 25 mg qd & Lisinopril 10 mg qd) Pt appreciated assistance [id number] Spoke with Mitzi Hansen at [facility] to cancel 03/29/14 1415 appointment for pt   1652 ED CM spoke with pt after calling [facility] (Rita-offer for March 29 2014 1415 appt $120) & general medical clinic (meagan - closed wednesdays but can walk in ) Pt prefers to walk in to General medical on a Saturday or [facility] on a Wednesday

## 2014-03-18 LAB — CSF CULTURE: GRAM STAIN: NONE SEEN

## 2014-03-18 LAB — CSF CULTURE W GRAM STAIN: Culture: NO GROWTH

## 2014-03-29 ENCOUNTER — Ambulatory Visit: Payer: Self-pay | Admitting: Internal Medicine

## 2015-01-14 ENCOUNTER — Encounter (HOSPITAL_COMMUNITY): Payer: Self-pay | Admitting: Emergency Medicine

## 2015-01-14 ENCOUNTER — Emergency Department (HOSPITAL_COMMUNITY): Payer: Self-pay

## 2015-01-14 ENCOUNTER — Other Ambulatory Visit (HOSPITAL_COMMUNITY): Payer: Self-pay

## 2015-01-14 ENCOUNTER — Inpatient Hospital Stay (HOSPITAL_COMMUNITY)
Admission: EM | Admit: 2015-01-14 | Discharge: 2015-01-17 | DRG: 683 | Disposition: A | Discharge status: Home / Self Care | Payer: Self-pay | Attending: Internal Medicine | Admitting: Internal Medicine

## 2015-01-14 DIAGNOSIS — Z9114 Patient's other noncompliance with medication regimen: Secondary | ICD-10-CM | POA: Diagnosis present

## 2015-01-14 DIAGNOSIS — F17210 Nicotine dependence, cigarettes, uncomplicated: Secondary | ICD-10-CM | POA: Diagnosis present

## 2015-01-14 DIAGNOSIS — R519 Headache, unspecified: Secondary | ICD-10-CM

## 2015-01-14 DIAGNOSIS — E876 Hypokalemia: Secondary | ICD-10-CM

## 2015-01-14 DIAGNOSIS — N189 Chronic kidney disease, unspecified: Secondary | ICD-10-CM | POA: Diagnosis present

## 2015-01-14 DIAGNOSIS — R05 Cough: Secondary | ICD-10-CM | POA: Diagnosis present

## 2015-01-14 DIAGNOSIS — A599 Trichomoniasis, unspecified: Secondary | ICD-10-CM | POA: Diagnosis present

## 2015-01-14 DIAGNOSIS — N179 Acute kidney failure, unspecified: Secondary | ICD-10-CM | POA: Diagnosis present

## 2015-01-14 DIAGNOSIS — E785 Hyperlipidemia, unspecified: Secondary | ICD-10-CM | POA: Diagnosis present

## 2015-01-14 DIAGNOSIS — Z720 Tobacco use: Secondary | ICD-10-CM | POA: Diagnosis present

## 2015-01-14 DIAGNOSIS — F1910 Other psychoactive substance abuse, uncomplicated: Secondary | ICD-10-CM | POA: Diagnosis present

## 2015-01-14 DIAGNOSIS — I129 Hypertensive chronic kidney disease with stage 1 through stage 4 chronic kidney disease, or unspecified chronic kidney disease: Principal | ICD-10-CM | POA: Diagnosis present

## 2015-01-14 DIAGNOSIS — N390 Urinary tract infection, site not specified: Secondary | ICD-10-CM | POA: Diagnosis present

## 2015-01-14 DIAGNOSIS — I161 Hypertensive emergency: Secondary | ICD-10-CM | POA: Diagnosis present

## 2015-01-14 DIAGNOSIS — I10 Essential (primary) hypertension: Secondary | ICD-10-CM | POA: Diagnosis present

## 2015-01-14 DIAGNOSIS — R51 Headache: Secondary | ICD-10-CM

## 2015-01-14 DIAGNOSIS — R000 Tachycardia, unspecified: Secondary | ICD-10-CM | POA: Diagnosis present

## 2015-01-14 DIAGNOSIS — I160 Hypertensive urgency: Secondary | ICD-10-CM

## 2015-01-14 DIAGNOSIS — R112 Nausea with vomiting, unspecified: Secondary | ICD-10-CM | POA: Diagnosis present

## 2015-01-14 HISTORY — DX: Other psychoactive substance abuse, uncomplicated: F19.10

## 2015-01-14 HISTORY — DX: Tobacco use: Z72.0

## 2015-01-14 HISTORY — DX: Alcohol abuse, uncomplicated: F10.10

## 2015-01-14 LAB — CBC
HEMATOCRIT: 42 % (ref 36.0–46.0)
HEMOGLOBIN: 15.3 g/dL — AB (ref 12.0–15.0)
MCH: 33.6 pg (ref 26.0–34.0)
MCHC: 36.4 g/dL — ABNORMAL HIGH (ref 30.0–36.0)
MCV: 92.1 fL (ref 78.0–100.0)
Platelets: 99 10*3/uL — ABNORMAL LOW (ref 150–400)
RBC: 4.56 MIL/uL (ref 3.87–5.11)
RDW: 13.2 % (ref 11.5–15.5)
WBC: 12.9 10*3/uL — ABNORMAL HIGH (ref 4.0–10.5)

## 2015-01-14 LAB — BASIC METABOLIC PANEL
Anion gap: 17 — ABNORMAL HIGH (ref 5–15)
BUN: 16 mg/dL (ref 6–20)
CHLORIDE: 97 mmol/L — AB (ref 101–111)
CO2: 22 mmol/L (ref 22–32)
Calcium: 10.5 mg/dL — ABNORMAL HIGH (ref 8.9–10.3)
Creatinine, Ser: 1.66 mg/dL — ABNORMAL HIGH (ref 0.44–1.00)
GFR calc Af Amer: 43 mL/min — ABNORMAL LOW (ref >60)
GFR calc non Af Amer: 37 mL/min — ABNORMAL LOW (ref >60)
GLUCOSE: 81 mg/dL (ref 65–99)
Potassium: 2.8 mmol/L — ABNORMAL LOW (ref 3.5–5.1)
Sodium: 136 mmol/L (ref 135–145)

## 2015-01-14 MED ORDER — HYDROCODONE-ACETAMINOPHEN 5-325 MG PO TABS
1.0000 | ORAL_TABLET | Freq: Once | ORAL | Status: AC
  Administered 2015-01-14: 1 via ORAL
  Filled 2015-01-14: qty 1

## 2015-01-14 MED ORDER — METOCLOPRAMIDE HCL 5 MG/ML IJ SOLN
10.0000 mg | Freq: Once | INTRAMUSCULAR | Status: AC
  Administered 2015-01-14: 10 mg via INTRAVENOUS
  Filled 2015-01-14: qty 2

## 2015-01-14 MED ORDER — MORPHINE SULFATE 4 MG/ML IJ SOLN
4.0000 mg | Freq: Once | INTRAMUSCULAR | Status: AC
  Administered 2015-01-14: 4 mg via INTRAVENOUS
  Filled 2015-01-14: qty 1

## 2015-01-14 MED ORDER — LISINOPRIL 10 MG PO TABS
10.0000 mg | ORAL_TABLET | Freq: Once | ORAL | Status: AC
  Administered 2015-01-14: 10 mg via ORAL
  Filled 2015-01-14: qty 1

## 2015-01-14 MED ORDER — CLEVIDIPINE BUTYRATE 0.5 MG/ML IV EMUL
0.0000 mg/h | INTRAVENOUS | Status: DC
  Administered 2015-01-14: 1 mg/h via INTRAVENOUS
  Filled 2015-01-14 (×13): qty 50

## 2015-01-14 MED ORDER — POTASSIUM CHLORIDE CRYS ER 20 MEQ PO TBCR
40.0000 meq | EXTENDED_RELEASE_TABLET | Freq: Once | ORAL | Status: AC
Start: 2015-01-14 — End: 2015-01-14
  Administered 2015-01-14: 40 meq via ORAL
  Filled 2015-01-14: qty 2

## 2015-01-14 MED ORDER — HYDROCHLOROTHIAZIDE 25 MG PO TABS
25.0000 mg | ORAL_TABLET | Freq: Once | ORAL | Status: AC
  Administered 2015-01-14: 25 mg via ORAL
  Filled 2015-01-14: qty 1

## 2015-01-14 MED ORDER — LABETALOL HCL 5 MG/ML IV SOLN
10.0000 mg | Freq: Once | INTRAVENOUS | Status: AC
  Administered 2015-01-14: 10 mg via INTRAVENOUS
  Filled 2015-01-14: qty 4

## 2015-01-14 MED ORDER — LABETALOL HCL 5 MG/ML IV SOLN
20.0000 mg | Freq: Once | INTRAVENOUS | Status: AC
  Administered 2015-01-14: 20 mg via INTRAVENOUS
  Filled 2015-01-14: qty 4

## 2015-01-14 NOTE — ED Provider Notes (Signed)
CSN: [id number]     Arrival date & time 01/14/15  1829 History   First MD Initiated Contact with Patient 01/14/15 1845     Chief Complaint  Patient presents with  . Hypertension     (Consider location/radiation/quality/duration/timing/severity/associated sxs/prior Treatment) HPI Comments: Patient with history of hypertension presents with complaint of high blood pressure, severe headache across the front of her head associated vomiting 6 episodes today. Patient states that she has been out of her blood pressure medicine which include lisinopril and HCTZ for the past 2 weeks. She did not fall or hit her head. She sees clear spots in her vision but no loss of vision. No weakness or numbness in her extremities. No difficulty with balance or ambulation. Patient denies chest pain or shortness of breath. No change in urination. No abdominal pain or urinary symptoms. Patient states that she has felt bad in the past when her blood pressure is been high with a headache but has never had vomiting before with her elevated blood pressure. The onset of this condition was gradual. The course is constant. Aggravating factors: none. Alleviating factors: none.     Patient is a 40 y.o. female presenting with hypertension. The history is provided by the patient.  Hypertension Associated symptoms include headaches, nausea and vomiting. Pertinent negatives include no abdominal pain, chest pain, congestion, coughing, diaphoresis, fever, neck pain, numbness, rash or weakness.    Past Medical History  Diagnosis Date  . Ectopic pregnancy   . Hypertension    Past Surgical History  Procedure Laterality Date  . Dilation and curettage of uterus    . Cesarean section     Family History  Problem Relation Age of Onset  . CAD Mother   . CAD Brother   . Hypertension Other   . Kidney failure Other    History  Substance Use Topics  . Smoking status: Current Every Day Smoker -- 0.50 packs/day    Types: Cigars,  Cigarettes  . Smokeless tobacco: Never Used  . Alcohol Use: 4.5 oz/week    9 drink(s) per week     Comment: occ   OB History    No data available     Review of Systems  Constitutional: Negative for fever and diaphoresis.  HENT: Negative for congestion, dental problem, rhinorrhea and sinus pressure.   Eyes: Positive for visual disturbance. Negative for photophobia, discharge and redness.  Respiratory: Negative for cough and shortness of breath.   Cardiovascular: Negative for chest pain, palpitations and leg swelling.  Gastrointestinal: Positive for nausea and vomiting. Negative for abdominal pain.  Genitourinary: Negative for dysuria.  Musculoskeletal: Negative for back pain, gait problem, neck pain and neck stiffness.  Skin: Negative for rash.  Neurological: Positive for headaches. Negative for syncope, speech difficulty, weakness, light-headedness and numbness.  Psychiatric/Behavioral: Negative for confusion. The patient is not nervous/anxious.       Allergies  Review of patient's allergies indicates no known allergies.  Home Medications   Prior to Admission medications   Medication Sig Start Date End Date Taking? Authorizing Provider  acetaminophen (TYLENOL) 500 MG tablet Take 1,000 mg by mouth every 6 (six) hours as needed for headache.    Historical Provider, MD  hydrochlorothiazide (HYDRODIURIL) 25 MG tablet Take 1 tablet (25 mg total) by mouth daily. 03/15/14   Jola Schmidt, MD  lisinopril (PRINIVIL,ZESTRIL) 10 MG tablet Take 1 tablet (10 mg total) by mouth daily. 12/29/12   Nicole Pisciotta, PA-C  lisinopril (PRINIVIL,ZESTRIL) 10 MG tablet Take 1  tablet (10 mg total) by mouth daily. 03/15/14   Jola Schmidt, MD   BP 233/151 mmHg  Pulse 99  Temp(Src) 98.6 F (37 C)  Resp 25  Ht 5\' 6"  (1.676 m)  Wt 130 lb (58.968 kg)  BMI 20.99 kg/m2  SpO2 100%  LMP 01/01/2015   Physical Exam  Constitutional: She is oriented to person, place, and time. She appears well-developed and  well-nourished.  HENT:  Head: Normocephalic and atraumatic.  Right Ear: Tympanic membrane, external ear and ear canal normal.  Left Ear: Tympanic membrane, external ear and ear canal normal.  Nose: Nose normal.  Mouth/Throat: Uvula is midline, oropharynx is clear and moist and mucous membranes are normal. Mucous membranes are not dry.  Eyes: Conjunctivae, EOM and lids are normal. Pupils are equal, round, and reactive to light. Right eye exhibits no discharge. Left eye exhibits no discharge. Right eye exhibits no nystagmus. Left eye exhibits no nystagmus.  Neck: Trachea normal and normal range of motion. Neck supple. Normal carotid pulses and no JVD present. No muscular tenderness present. Carotid bruit is not present. No tracheal deviation present.  Cardiovascular: Normal rate, regular rhythm, S1 normal, S2 normal, normal heart sounds and intact distal pulses.  Exam reveals no decreased pulses.   No murmur heard. Pulmonary/Chest: Effort normal and breath sounds normal. No respiratory distress. She has no wheezes. She has no rales. She exhibits no tenderness.  Abdominal: Soft. Normal aorta and bowel sounds are normal. There is no tenderness. There is no rebound and no guarding.  Musculoskeletal: Normal range of motion.       Cervical back: She exhibits normal range of motion, no tenderness and no bony tenderness.  Neurological: She is alert and oriented to person, place, and time. She has normal strength and normal reflexes. No cranial nerve deficit or sensory deficit. She displays a negative Romberg sign. Coordination and gait normal. GCS eye subscore is 4. GCS verbal subscore is 5. GCS motor subscore is 6.  Skin: Skin is warm and dry. She is not diaphoretic. No cyanosis. No pallor.  Psychiatric: She has a normal mood and affect.  Nursing note and vitals reviewed.   ED Course  Procedures (including critical care time) Labs Review Labs Reviewed  CBC - Abnormal; Notable for the following:     WBC 12.9 (*)    Hemoglobin 15.3 (*)    MCHC 36.4 (*)    Platelets 99 (*)    All other components within normal limits  BASIC METABOLIC PANEL - Abnormal; Notable for the following:    Potassium 2.8 (*)    Chloride 97 (*)    Creatinine, Ser 1.66 (*)    Calcium 10.5 (*)    GFR calc non Af Amer 37 (*)    GFR calc Af Amer 43 (*)    Anion gap 17 (*)    All other components within normal limits    Imaging Review Ct Head Wo Contrast  01/14/2015   CLINICAL DATA:  Hypertension.  Headache and dizziness.  EXAM: CT HEAD WITHOUT CONTRAST  TECHNIQUE: Contiguous axial images were obtained from the base of the skull through the vertex without intravenous contrast.  COMPARISON:  03/14/2014  FINDINGS: The brainstem, cerebellum, cerebral peduncles, thalamus, basal ganglia, basilar cisterns, and ventricular system appear within normal limits. No intracranial hemorrhage, mass lesion, or acute CVA.  Minimal chronic ethmoid and sphenoid sinusitis.  IMPRESSION: 1. Minimal chronic ethmoid and sphenoid sinusitis. Otherwise, no significant abnormalities are observed.   Electronically Signed  By: Van Clines M.D.   On: 01/14/2015 20:03     EKG Interpretation None       6:57 PM Patient seen and examined. Work-up initiated. Medications ordered.   Vital signs reviewed and are as follows: BP 233/151 mmHg  Pulse 99  Temp(Src) 98.6 F (37 C)  Resp 25  Ht 5\' 6"  (1.676 m)  Wt 130 lb (58.968 kg)  BMI 20.99 kg/m2  SpO2 100%  LMP 01/01/2015  10:27 PM Patient continues to be hypertensive above 200/110 after push dose labetalol x 2, oral antihypertensives. Will start drip. Pt updated. Her pain currently 8/10 after morphine. Oral potassium given.   Pt discussed with and seen by Dr. Vanita Panda previously.   Spoke with Dr. Blaine Hamper who will admit. Stepdown bed requested.   MDM   Final diagnoses:  Headache  Hypertensive urgency  Hypokalemia   Admit.     Carlisle Cater, PA-C 01/14/15 2229  Carmin Muskrat, MD 01/15/15 [phone]

## 2015-01-14 NOTE — H&P (Signed)
[facility] History and Physical  Melody [name] [id number] DOB: [birth year] DOA: 01/14/2015  Referring physician: ED physician PCP: No PCP Per Patient  Specialists:   Chief Complaint: headache, nausea, vomiting and elevated blood pressure  HPI: Melody [name] is a 39 y.o. female with PMH of hypertension, tobacco abuse, alcohol abuse, drug abuse, medication noncompliance, who presents with headache, nausea, vomiting and elevated blood pressure  Patient reports that she ran out of her blood pressure medications for more than 2 weeks. She developed severe headache today. It is associated with nausea and vomiting. She vomited 6 times without blood in the vomitus. Her headache is located in the frontal area, persistent, nonradiating. She did not fall or hit her head. She sees clear spots in her eyes which has resolved. No loss of vision. Patient does not have chest pain, shortness of breath. No unilateral weakness, numbness or tingling sensations. She has mild cough due to smoking, which has not changed.  Currently patient denies fever, chills, running nose, ear pain, headaches, abdominal pain, diarrhea, constipation, dysuria, urgency, frequency, hematuria, skin rashes, joint pain or leg swelling.  In ED, patient was found to have blood pressure 242/142, WBC 12.9, temperature normal, tachycardia, potassium 2.8, AKI. CT head is negative for acute intracranial abnormalities, but with sinusitis. Patient is admitted to inpatient for further evaluation and treatment.  Where does patient live?   At home    Can patient participate in ADLs?  Yes    Review of Systems:   General: no fevers, chills, no changes in body weight, has poor appetite, has fatigue HEENT: no blurry vision, hearing changes or sore throat Pulm: no dyspnea, has coughing, no wheezing CV: no chest pain, palpitations Abd: has nausea, vomiting, No abdominal pain, diarrhea, constipation GU: no dysuria, burning on  urination, increased urinary frequency, hematuria  Ext: no leg edema Neuro: no unilateral weakness, numbness, or tingling, no hearing loss Skin: no rash MSK: No muscle spasm, no deformity, no limitation of range of movement in spin Heme: No easy bruising.  Travel history: No recent long distant travel.  Allergy: No Known Allergies  Past Medical History  Diagnosis Date  . Ectopic pregnancy   . Hypertension   . Tobacco abuse   . Alcohol abuse   . Drug abuse     Past Surgical History  Procedure Laterality Date  . Dilation and curettage of uterus    . Cesarean section      Social History:  reports that she has been smoking Cigars and Cigarettes.  She has been smoking about 0.50 packs per day. She has never used smokeless tobacco. She reports that she drinks about 4.5 oz of alcohol per week. She reports that she uses illicit drugs (Marijuana).  Family History:  Family History  Problem Relation Age of Onset  . CAD Mother   . CAD Brother   . Hypertension Other   . Kidney failure Other      Prior to Admission medications   Medication Sig Start Date End Date Taking? Authorizing Provider  hydrochlorothiazide (HYDRODIURIL) 25 MG tablet Take 1 tablet (25 mg total) by mouth daily. 03/15/14  Yes Jola Schmidt, MD  ibuprofen (ADVIL,MOTRIN) 200 MG tablet Take 400 mg by mouth every 6 (six) hours as needed for mild pain.   Yes Historical Provider, MD  lisinopril (PRINIVIL,ZESTRIL) 10 MG tablet Take 1 tablet (10 mg total) by mouth daily. 12/29/12   Monico Blitz, PA-C    Physical Exam: Danley Danker Vitals:   01/14/15  2216 01/14/15 2230 01/14/15 2300 01/14/15 2315  BP: 200/103 184/122 152/103 144/99  Pulse: 74 77 79 89  Temp:      TempSrc:      Resp: 22 23 17 16   Height:      Weight:      SpO2: 100% 100% 96% 97%   General: Not in acute distress. Looks tired. Dry mucous and membrane HEENT:       Eyes: PERRL, EOMI, no scleral icterus.       ENT: No discharge from the ears and nose, no  pharynx injection, no tonsillar enlargement.        Neck: No JVD, no bruit, no mass felt. Heme: No neck lymph node enlargement. Cardiac: S1/S2, RRR, No murmurs, No gallops or rubs. Pulm: Good air movement bilaterally. No rales, wheezing, rhonchi or rubs. Abd: Soft, nondistended, nontender, no rebound pain, no organomegaly, BS present. Ext: No pitting leg edema bilaterally. 2+DP/PT pulse bilaterally. Musculoskeletal: No joint deformities, No joint redness or warmth, no limitation of ROM in spin. Skin: No rashes.  Neuro: Alert, oriented X3, cranial nerves II-XII grossly intact, muscle strength 5/5 in all extremities, sensation to light touch intact. Brachial reflex 2+ bilaterally. Knee reflex 1+ bilaterally. Negative Babinski's sign. Normal finger to nose test. Psych: Patient is not psychotic, no suicidal or hemocidal ideation.  Labs on Admission:  Basic Metabolic Panel:  Recent Labs Lab 01/14/15 1844  NA 136  K 2.8*  CL 97*  CO2 22  GLUCOSE 81  BUN 16  CREATININE 1.66*  CALCIUM 10.5*   Liver Function Tests: No results for input(s): AST, ALT, ALKPHOS, BILITOT, PROT, ALBUMIN in the last 168 hours. No results for input(s): LIPASE, AMYLASE in the last 168 hours. No results for input(s): AMMONIA in the last 168 hours. CBC:  Recent Labs Lab 01/14/15 1844  WBC 12.9*  HGB 15.3*  HCT 42.0  MCV 92.1  PLT 99*   Cardiac Enzymes: No results for input(s): CKTOTAL, CKMB, CKMBINDEX, TROPONINI in the last 168 hours.  BNP (last 3 results) No results for input(s): BNP in the last 8760 hours.  ProBNP (last 3 results) No results for input(s): PROBNP in the last 8760 hours.  CBG: No results for input(s): GLUCAP in the last 168 hours.  Radiological Exams on Admission: Ct Head Wo Contrast  01/14/2015   CLINICAL DATA:  Hypertension.  Headache and dizziness.  EXAM: CT HEAD WITHOUT CONTRAST  TECHNIQUE: Contiguous axial images were obtained from the base of the skull through the vertex  without intravenous contrast.  COMPARISON:  03/14/2014  FINDINGS: The brainstem, cerebellum, cerebral peduncles, thalamus, basal ganglia, basilar cisterns, and ventricular system appear within normal limits. No intracranial hemorrhage, mass lesion, or acute CVA.  Minimal chronic ethmoid and sphenoid sinusitis.  IMPRESSION: 1. Minimal chronic ethmoid and sphenoid sinusitis. Otherwise, no significant abnormalities are observed.   Electronically Signed   By: Van Clines M.D.   On: 01/14/2015 20:03    EKG: Independently reviewed.  Abnormal findings: Q wave in V2, T-wave inversion in V3 to V6 and a inferior release.   Assessment/Plan Principal Problem:   Hypertensive emergency Active Problems:   Hypertension   Tobacco abuse   Drug abuse   AKI (acute kidney injury)   Hypokalemia  Hypertensive emergency: Patient has significantly elevated blood pressure, 242/142 in emergency room. She has AKI, consistent with hypertensive emergency. This is most likely due to medication noncompliance. Patient is being treated with Clevipre (clevidipine, CCB) drip in ED. Her blood pressure  have improved significantly.Patient does not have chest pain. No focal neurological findings and mental status is normal, less likely to have hypertensive encephalopathy. - Will admit to SDU - Continue Cleviprex drip for now, the of goal of bp reduction is by about 25 to 30% in first several hours, at SBP 170 to 180 mmHg. - Frequent neuro check  - trop x 3 - EKG in AM - Zofran for nausea  HTN: has hx of medication noncompliance, now presents with hypertensive emergency. -hold home lisinopril due to AKI - hold HCTZ since patient is clinically dry -Consult to case manager for medication need -Amlodipine 10 mg daily -check A1c and FLP  Tobacco abuse and hx of drug abuse: -Did counseling about importance of quitting smoking -Nicotine patch -check UDS and HIV ab  Hypokalemia: K= 2.8 on admission. - Repleted - Check  Mg level  AKI: likely due to hypertensive emergency and NSAIDs use -IVF: 75 cc/h -Check FeUrea -US-renal -Follow up renal function by BMP -Avoid ACEI and NSAIDs now   DVT ppx: SQ Heparin      Code Status: Full code Family Communication:  Yes, patient's  boyfriend at bed side Disposition Plan: Admit to inpatient   Date of Service 01/14/2015    Ivor Costa [facility] Pager [phone]  If 7PM-7AM, please contact night-coverage [email or web address] Password [email or web address] 01/14/2015, 11:43 PM

## 2015-01-14 NOTE — ED Notes (Signed)
Pt c/o high blood pressure onset today with shaking. Pt has not had her BP medications x 2 weeks. Readings at home 237/140. Triage BP 233/151.

## 2015-01-15 ENCOUNTER — Encounter (HOSPITAL_COMMUNITY): Payer: Self-pay

## 2015-01-15 ENCOUNTER — Inpatient Hospital Stay (HOSPITAL_COMMUNITY): Payer: Self-pay

## 2015-01-15 LAB — COMPREHENSIVE METABOLIC PANEL
ALK PHOS: 40 U/L (ref 38–126)
ALT: 15 U/L (ref 14–54)
ANION GAP: 9 (ref 5–15)
AST: 23 U/L (ref 15–41)
Albumin: 3.7 g/dL (ref 3.5–5.0)
BILIRUBIN TOTAL: 1 mg/dL (ref 0.3–1.2)
BUN: 19 mg/dL (ref 6–20)
CO2: 26 mmol/L (ref 22–32)
CREATININE: 1.89 mg/dL — AB (ref 0.44–1.00)
Calcium: 9.3 mg/dL (ref 8.9–10.3)
Chloride: 98 mmol/L — ABNORMAL LOW (ref 101–111)
GFR calc Af Amer: 37 mL/min — ABNORMAL LOW (ref >60)
GFR calc non Af Amer: 32 mL/min — ABNORMAL LOW (ref >60)
Glucose, Bld: 158 mg/dL — ABNORMAL HIGH (ref 65–99)
POTASSIUM: 2.8 mmol/L — AB (ref 3.5–5.1)
SODIUM: 133 mmol/L — AB (ref 135–145)
TOTAL PROTEIN: 6.5 g/dL (ref 6.5–8.1)

## 2015-01-15 LAB — RAPID URINE DRUG SCREEN, HOSP PERFORMED
AMPHETAMINES: NOT DETECTED
BARBITURATES: NOT DETECTED
BENZODIAZEPINES: NOT DETECTED
COCAINE: NOT DETECTED
OPIATES: POSITIVE — AB
Tetrahydrocannabinol: POSITIVE — AB

## 2015-01-15 LAB — HIV ANTIBODY (ROUTINE TESTING W REFLEX): HIV Screen 4th Generation wRfx: NONREACTIVE

## 2015-01-15 LAB — MAGNESIUM: MAGNESIUM: 2 mg/dL (ref 1.7–2.4)

## 2015-01-15 LAB — LIPID PANEL
CHOL/HDL RATIO: 2.5 ratio
CHOLESTEROL: 240 mg/dL — AB (ref 0–200)
HDL: 96 mg/dL (ref >40)
LDL Cholesterol: 129 mg/dL — ABNORMAL HIGH (ref 0–99)
TRIGLYCERIDES: 74 mg/dL (ref <150)
VLDL: 15 mg/dL (ref 0–40)

## 2015-01-15 LAB — CREATININE, URINE, RANDOM: Creatinine, Urine: 159.38 mg/dL

## 2015-01-15 LAB — MRSA PCR SCREENING: MRSA by PCR: NEGATIVE

## 2015-01-15 LAB — POTASSIUM: POTASSIUM: 3 mmol/L — AB (ref 3.5–5.1)

## 2015-01-15 MED ORDER — SODIUM CHLORIDE 0.9 % IV SOLN
INTRAVENOUS | Status: DC
  Administered 2015-01-15: 13:00:00 via INTRAVENOUS
  Administered 2015-01-15: 75 mL via INTRAVENOUS
  Administered 2015-01-16: 02:00:00 via INTRAVENOUS

## 2015-01-15 MED ORDER — SIMVASTATIN 10 MG PO TABS
10.0000 mg | ORAL_TABLET | Freq: Every day | ORAL | Status: DC
  Administered 2015-01-15 – 2015-01-16 (×2): 10 mg via ORAL
  Filled 2015-01-15 (×4): qty 1

## 2015-01-15 MED ORDER — CLEVIDIPINE BUTYRATE 0.5 MG/ML IV EMUL
0.0000 mg/h | INTRAVENOUS | Status: DC
  Filled 2015-01-15 (×45): qty 50

## 2015-01-15 MED ORDER — GUAIFENESIN ER 600 MG PO TB12
600.0000 mg | ORAL_TABLET | Freq: Two times a day (BID) | ORAL | Status: DC
  Administered 2015-01-15 – 2015-01-17 (×6): 600 mg via ORAL
  Filled 2015-01-15 (×7): qty 1

## 2015-01-15 MED ORDER — DIPHENHYDRAMINE HCL 50 MG/ML IJ SOLN
25.0000 mg | Freq: Once | INTRAMUSCULAR | Status: AC
  Administered 2015-01-15: 25 mg via INTRAVENOUS
  Filled 2015-01-15: qty 1
  Filled 2015-01-15: qty 0.5

## 2015-01-15 MED ORDER — AMLODIPINE BESYLATE 10 MG PO TABS
10.0000 mg | ORAL_TABLET | Freq: Every day | ORAL | Status: DC
  Administered 2015-01-15 – 2015-01-17 (×3): 10 mg via ORAL
  Filled 2015-01-15 (×3): qty 1

## 2015-01-15 MED ORDER — ACETAMINOPHEN 650 MG RE SUPP: 650.0000 mg | Freq: Four times a day (QID) | RECTAL | Status: DC | PRN

## 2015-01-15 MED ORDER — NICOTINE 21 MG/24HR TD PT24
21.0000 mg | MEDICATED_PATCH | Freq: Every day | TRANSDERMAL | Status: DC
  Administered 2015-01-15 – 2015-01-17 (×3): 21 mg via TRANSDERMAL
  Filled 2015-01-15 (×3): qty 1

## 2015-01-15 MED ORDER — HEPARIN SODIUM (PORCINE) 5000 UNIT/ML IJ SOLN
5000.0000 [IU] | Freq: Three times a day (TID) | INTRAMUSCULAR | Status: DC
  Administered 2015-01-15 – 2015-01-17 (×6): 5000 [IU] via SUBCUTANEOUS
  Filled 2015-01-15 (×11): qty 1

## 2015-01-15 MED ORDER — SODIUM CHLORIDE 0.9 % IJ SOLN
3.0000 mL | Freq: Two times a day (BID) | INTRAMUSCULAR | Status: DC
  Administered 2015-01-15 – 2015-01-16 (×2): 3 mL via INTRAVENOUS

## 2015-01-15 MED ORDER — POTASSIUM CHLORIDE CRYS ER 20 MEQ PO TBCR: 40.0000 meq | EXTENDED_RELEASE_TABLET | Freq: Two times a day (BID) | ORAL | Status: DC

## 2015-01-15 MED ORDER — ONDANSETRON HCL 4 MG/2ML IJ SOLN: 4.0000 mg | Freq: Three times a day (TID) | INTRAMUSCULAR | Status: AC | PRN

## 2015-01-15 MED ORDER — ACETAMINOPHEN 325 MG PO TABS
650.0000 mg | ORAL_TABLET | Freq: Four times a day (QID) | ORAL | Status: DC | PRN
  Administered 2015-01-16: 650 mg via ORAL
  Filled 2015-01-15 (×2): qty 2

## 2015-01-15 MED ORDER — KETOROLAC TROMETHAMINE 30 MG/ML IJ SOLN
15.0000 mg | Freq: Once | INTRAMUSCULAR | Status: AC
  Administered 2015-01-15: 15 mg via INTRAVENOUS
  Filled 2015-01-15 (×2): qty 1

## 2015-01-15 MED ORDER — POTASSIUM CHLORIDE CRYS ER 20 MEQ PO TBCR
20.0000 meq | EXTENDED_RELEASE_TABLET | Freq: Once | ORAL | Status: AC
  Administered 2015-01-15: 20 meq via ORAL
  Filled 2015-01-15: qty 1

## 2015-01-15 MED ORDER — ALUM & MAG HYDROXIDE-SIMETH 200-200-20 MG/5ML PO SUSP: 30.0000 mL | Freq: Four times a day (QID) | ORAL | Status: DC | PRN

## 2015-01-15 MED ORDER — POTASSIUM CHLORIDE CRYS ER 20 MEQ PO TBCR
40.0000 meq | EXTENDED_RELEASE_TABLET | Freq: Two times a day (BID) | ORAL | Status: AC
  Administered 2015-01-15 (×2): 40 meq via ORAL
  Filled 2015-01-15 (×2): qty 2

## 2015-01-15 MED ORDER — METOCLOPRAMIDE HCL 5 MG/ML IJ SOLN
10.0000 mg | Freq: Once | INTRAMUSCULAR | Status: AC
  Administered 2015-01-15: 10 mg via INTRAVENOUS
  Filled 2015-01-15: qty 2

## 2015-01-15 MED ORDER — KETOROLAC TROMETHAMINE 30 MG/ML IJ SOLN: 30.0000 mg | Freq: Once | INTRAMUSCULAR | Status: DC

## 2015-01-15 NOTE — Progress Notes (Signed)
[facility]                                                                              Patient Demographics  Melody [name], is a 39 y.o. female, DOB - [birth year], [id number]  Admit date - 01/14/2015   Admitting Physician Ivor Costa, MD  Outpatient Primary MD for the patient is No PCP Per Patient  LOS - 1   Chief Complaint  Patient presents with  . Hypertension     HPI on 01/14/2015 by Dr. Ivor Costa  Melody [name] is a 39 y.o. female with PMH of hypertension, tobacco abuse, alcohol abuse, drug abuse, medication noncompliance, who presents with headache, nausea, vomiting and elevated blood pressure Patient reports that she ran out of her blood pressure medications for more than 2 weeks. She developed severe headache today. It is associated with nausea and vomiting. She vomited 6 times without blood in the vomitus. Her headache is located in the frontal area, persistent, nonradiating. She did not fall or hit her head. She sees clear spots in her eyes which has resolved. No loss of vision. Patient does not have chest pain, shortness of breath. No unilateral weakness, numbness or tingling sensations. She has mild cough due to smoking, which has not changed. Currently patient denies fever, chills, running nose, ear pain, headaches, abdominal pain, diarrhea, constipation, dysuria, urgency, frequency, hematuria, skin rashes, joint pain or leg swelling. In ED, patient was found to have blood pressure 242/142, WBC 12.9, temperature normal, tachycardia, potassium 2.8, AKI. CT head is negative for acute intracranial abnormalities, but with sinusitis. Patient is admitted to inpatient for further evaluation and treatment.   Assessment & Plan   Hypertensive Emergency/Hypertension -Resolved -Likely secondary to medication noncompliance -Upon admission, BP 242/142, currently 113/78 -Patient was place don Cleviprex drip, currently off -Continue amlodipine -Will speak with Case  management regarding medication access -trop neg  Acute kidney injury -Creatinine continues to rise, likely secondary to the above process -Continue IVF -Will obtain renal US -Avoid nephrotoxic agents  Tobacco/Polysubstance absuse -Drug screen +THC -counseling given  Hypokalemia -Possibly secondary to GI losses -Continue to replace -Continue to monitor  Nausea/vomiting -Appear to have resolved, likely secondary to HTN -Continue antiemetics as needed  Hyperlipidemia -lipid panel: TC240,TG 74, HDL 96, LDL 129 -Will start on low dose statin  Code Status: Full  Family Communication: Family at bedside  Disposition Plan: Admitted, continue to monitor Cr.  Will transfer out of step down to medical floor.   Time Spent in minutes   30 minutes  Procedures  Renal US  Consults   None  DVT Prophylaxis  Heparin  Lab Results  Component Value Date   PLT 99* 01/14/2015    Medications  Scheduled Meds: . amLODipine  10 mg Oral Daily  . guaiFENesin  600 mg Oral BID  . heparin  5,000 Units Subcutaneous 3 times per day  . nicotine  21 mg Transdermal Daily  . potassium chloride  40 mEq Oral BID  . sodium chloride  3 mL Intravenous Q12H   Continuous Infusions: . sodium chloride 75 mL (01/15/15 0015)  . clevidipine Stopped (01/15/15 0115)   PRN Meds:.acetaminophen **OR** acetaminophen, alum &  mag hydroxide-simeth, ondansetron (ZOFRAN) IV  Antibiotics    Anti-infectives    None        Subjective:   Melody [name] seen and examined today.  Patient feeling better.  Denies further headache, nausea, vomiting, abdominal pain. Denies chest pain, shortness of breath.   Objective:   Filed Vitals:   01/15/15 0500 01/15/15 0600 01/15/15 0700 01/15/15 0721  BP: 102/71 113/61 113/78 113/78  Pulse: 72 69 67 70  Temp:    97.9 F (36.6 C)  TempSrc:    Oral  Resp: 16 16 16 27   Height:      Weight:      SpO2: 98% 94% 99% 100%    Wt Readings from Last 3 Encounters:    01/15/15 58.9 kg (129 lb 13.6 oz)  04/21/12 71.215 kg (157 lb)  08/28/11 70.308 kg (155 lb)     Intake/Output Summary (Last 24 hours) at 01/15/15 0723 Last data filed at 01/15/15 0600  Gross per 24 hour  Intake 678.78 ml  Output    350 ml  Net 328.78 ml    Exam  General: Well developed, well nourished, NAD, appears stated age  42: NCAT,mucous membranes moist.   Cardiovascular: S1 S2 auscultated, no rubs, murmurs or gallops. Regular rate and rhythm.  Respiratory: Clear to auscultation bilaterally with equal chest rise  Abdomen: Soft, nontender, nondistended, + bowel sounds  Extremities: warm dry without cyanosis clubbing or edema  Neuro: AAOx3, cranial nerves grossly intact. Strength 5/5 in patient's upper and lower extremities bilaterally  Psych: Normal affect and demeanor     Data Review   Micro Results Recent Results (from the past 240 hour(s))  MRSA PCR Screening     Status: None   Collection Time: 01/15/15 12:12 AM  Result Value Ref Range Status   MRSA by PCR NEGATIVE NEGATIVE Final    Comment:        The GeneXpert MRSA Assay (FDA approved for NASAL specimens only), is one component of a comprehensive MRSA colonization surveillance program. It is not intended to diagnose MRSA infection nor to guide or monitor treatment for MRSA infections.     Radiology Reports Ct Head Wo Contrast  01/14/2015   CLINICAL DATA:  Hypertension.  Headache and dizziness.  EXAM: CT HEAD WITHOUT CONTRAST  TECHNIQUE: Contiguous axial images were obtained from the base of the skull through the vertex without intravenous contrast.  COMPARISON:  03/14/2014  FINDINGS: The brainstem, cerebellum, cerebral peduncles, thalamus, basal ganglia, basilar cisterns, and ventricular system appear within normal limits. No intracranial hemorrhage, mass lesion, or acute CVA.  Minimal chronic ethmoid and sphenoid sinusitis.  IMPRESSION: 1. Minimal chronic ethmoid and sphenoid sinusitis.  Otherwise, no significant abnormalities are observed.   Electronically Signed   By: Van Clines M.D.   On: 01/14/2015 20:03    CBC  Recent Labs Lab 01/14/15 1844  WBC 12.9*  HGB 15.3*  HCT 42.0  PLT 99*  MCV 92.1  MCH 33.6  MCHC 36.4*  RDW 13.2    Chemistries   Recent Labs Lab 01/14/15 1844 01/15/15 0245  NA 136 133*  K 2.8* 2.8*  CL 97* 98*  CO2 22 26  GLUCOSE 81 158*  BUN 16 19  CREATININE 1.66* 1.89*  CALCIUM 10.5* 9.3  MG  --  2.0  AST  --  23  ALT  --  15  ALKPHOS  --  40  BILITOT  --  1.0   ------------------------------------------------------------------------------------------------------------------ estimated creatinine clearance is 36.4 mL/min (  by C-G formula based on Cr of 1.89). ------------------------------------------------------------------------------------------------------------------ No results for input(s): HGBA1C in the last 72 hours. ------------------------------------------------------------------------------------------------------------------  Recent Labs  01/15/15 0245  CHOL 240*  HDL 96  LDLCALC 129*  TRIG 74  CHOLHDL 2.5   ------------------------------------------------------------------------------------------------------------------ No results for input(s): TSH, T4TOTAL, T3FREE, THYROIDAB in the last 72 hours.  Invalid input(s): FREET3 ------------------------------------------------------------------------------------------------------------------ No results for input(s): VITAMINB12, FOLATE, FERRITIN, TIBC, IRON, RETICCTPCT in the last 72 hours.  Coagulation profile No results for input(s): INR, PROTIME in the last 168 hours.  No results for input(s): DDIMER in the last 72 hours.  Cardiac Enzymes No results for input(s): CKMB, TROPONINI, MYOGLOBIN in the last 168 hours.  Invalid input(s):  CK ------------------------------------------------------------------------------------------------------------------ Invalid input(s): POCBNP    Uziah Sorter D.O. on 01/15/2015 at 7:23 AM  Between 7am to 7pm - Pager - [phone]  After 7pm go to [email or web address] - password [email or web address]  And look for the night coverage person covering for me after hours  [facility] Office  [phone]

## 2015-01-15 NOTE — Progress Notes (Signed)
Notified Md about pt's potassium level 2.8.  New orders received.   Will continue to monitor. Saunders Revel T

## 2015-01-15 NOTE — Progress Notes (Signed)
Md notified for clarification on potassium po to be given at 10 am.  Will give first dose now.  Will continue to monitor. Melody [name]

## 2015-01-15 NOTE — Care Management Note (Signed)
Case Management Note  Patient Details  Name: Melody [name] MRN: [id number] Date of Birth: 28-Mar-1976  Subjective/Objective:        Adm w htn emergency          Action/Plan: lives w fam, no ins listed   Expected Discharge Date:  01/16/15               Expected Discharge Plan:  Home/Self Care  In-House Referral:     Discharge planning Services     Post Acute Care Choice:    Choice offered to:     DME Arranged:    DME Agency:     HH Arranged:    [place] Agency:     Status of Service:     Medicare Important Message Given:    Date Medicare IM Given:    Medicare IM give by:    Date Additional Medicare IM Given:    Additional Medicare Important Message give by:     If discussed at [place] of Stay Meetings, dates discussed:    Additional Comments: ur review done, left pt inform on [facility] and [facility].  Lacretia Leigh, RN 01/15/2015, 10:49 AM

## 2015-01-15 NOTE — Progress Notes (Signed)
Per pharmacy will move heparin dose to 0800. Saunders Revel T

## 2015-01-16 LAB — GLUCOSE, CAPILLARY
GLUCOSE-CAPILLARY: 81 mg/dL (ref 65–99)
GLUCOSE-CAPILLARY: 111 mg/dL — AB (ref 65–99)

## 2015-01-16 LAB — URINALYSIS, ROUTINE W REFLEX MICROSCOPIC
BILIRUBIN URINE: NEGATIVE
Glucose, UA: NEGATIVE mg/dL
HGB URINE DIPSTICK: NEGATIVE
KETONES UR: NEGATIVE mg/dL
Nitrite: NEGATIVE
Protein, ur: NEGATIVE mg/dL
Specific Gravity, Urine: 1.008 (ref 1.005–1.030)
UROBILINOGEN UA: 1 mg/dL (ref 0.0–1.0)
pH: 5 (ref 5.0–8.0)

## 2015-01-16 LAB — BASIC METABOLIC PANEL
Anion gap: 7 (ref 5–15)
BUN: 16 mg/dL (ref 6–20)
CALCIUM: 9.1 mg/dL (ref 8.9–10.3)
CHLORIDE: 108 mmol/L (ref 101–111)
CO2: 22 mmol/L (ref 22–32)
Creatinine, Ser: 1.72 mg/dL — ABNORMAL HIGH (ref 0.44–1.00)
GFR calc Af Amer: 42 mL/min — ABNORMAL LOW (ref >60)
GFR calc non Af Amer: 36 mL/min — ABNORMAL LOW (ref >60)
Glucose, Bld: 85 mg/dL (ref 65–99)
POTASSIUM: 3.5 mmol/L (ref 3.5–5.1)
Sodium: 137 mmol/L (ref 135–145)

## 2015-01-16 LAB — URINE MICROSCOPIC-ADD ON

## 2015-01-16 LAB — HEMOGLOBIN A1C
Hgb A1c MFr Bld: 5.2 % (ref 4.8–5.6)
Mean Plasma Glucose: 103 mg/dL

## 2015-01-16 LAB — UREA NITROGEN, URINE: UREA NITROGEN UR: 498 mg/dL

## 2015-01-16 MED ORDER — METOCLOPRAMIDE HCL 5 MG/ML IJ SOLN
10.0000 mg | Freq: Once | INTRAMUSCULAR | Status: AC
  Administered 2015-01-16: 10 mg via INTRAVENOUS
  Filled 2015-01-16: qty 2

## 2015-01-16 MED ORDER — DIPHENHYDRAMINE HCL 50 MG/ML IJ SOLN
25.0000 mg | Freq: Once | INTRAMUSCULAR | Status: AC
  Administered 2015-01-16: 25 mg via INTRAVENOUS
  Filled 2015-01-16: qty 1

## 2015-01-16 MED ORDER — HYDRALAZINE HCL 10 MG PO TABS
10.0000 mg | ORAL_TABLET | Freq: Three times a day (TID) | ORAL | Status: DC
  Administered 2015-01-16: 10 mg via ORAL
  Filled 2015-01-16 (×4): qty 1

## 2015-01-16 MED ORDER — HYDRALAZINE HCL 20 MG/ML IJ SOLN: 10.0000 mg | Freq: Once | INTRAMUSCULAR | Status: DC

## 2015-01-16 MED ORDER — HYDRALAZINE HCL 25 MG PO TABS: 25.0000 mg | ORAL_TABLET | Freq: Three times a day (TID) | ORAL | Status: DC

## 2015-01-16 MED ORDER — HYDRALAZINE HCL 20 MG/ML IJ SOLN
10.0000 mg | Freq: Four times a day (QID) | INTRAMUSCULAR | Status: DC | PRN
  Administered 2015-01-16 – 2015-01-17 (×2): 10 mg via INTRAVENOUS
  Filled 2015-01-16 (×2): qty 1

## 2015-01-16 MED ORDER — SIMVASTATIN 10 MG PO TABS: 10.0000 mg | ORAL_TABLET | Freq: Every day | ORAL | Status: DC

## 2015-01-16 MED ORDER — HYDRALAZINE HCL 20 MG/ML IJ SOLN
5.0000 mg | Freq: Once | INTRAMUSCULAR | Status: AC
  Administered 2015-01-16: 5 mg via INTRAVENOUS
  Filled 2015-01-16: qty 1

## 2015-01-16 MED ORDER — AMLODIPINE BESYLATE 10 MG PO TABS: 10.0000 mg | ORAL_TABLET | Freq: Every day | ORAL | Status: DC

## 2015-01-16 MED ORDER — KETOROLAC TROMETHAMINE 30 MG/ML IJ SOLN
15.0000 mg | Freq: Once | INTRAMUSCULAR | Status: AC
  Administered 2015-01-16: 15 mg via INTRAVENOUS
  Filled 2015-01-16: qty 1

## 2015-01-16 MED ORDER — HYDRALAZINE HCL 25 MG PO TABS
25.0000 mg | ORAL_TABLET | Freq: Three times a day (TID) | ORAL | Status: DC
  Administered 2015-01-16 – 2015-01-17 (×3): 25 mg via ORAL
  Filled 2015-01-16 (×7): qty 1

## 2015-01-16 MED ORDER — FUROSEMIDE 10 MG/ML IJ SOLN
10.0000 mg | Freq: Once | INTRAMUSCULAR | Status: AC
  Administered 2015-01-16: 10 mg via INTRAVENOUS
  Filled 2015-01-16: qty 2

## 2015-01-16 MED ORDER — NICOTINE 21 MG/24HR TD PT24: 21.0000 mg | MEDICATED_PATCH | Freq: Every day | TRANSDERMAL | Status: DC

## 2015-01-16 NOTE — Progress Notes (Signed)
[facility]                                                                              Patient Demographics  Melody [name], is a 39 y.o. female, DOB - 10-15-1975, [id number]  Admit date - 01/14/2015   Admitting Physician Ivor Costa, MD  Outpatient Primary MD for the patient is No PCP Per Patient  LOS - 2   Chief Complaint  Patient presents with  . Hypertension     HPI on 01/14/2015 by Dr. Ivor Costa  Melody [name] is a 39 y.o. female with PMH of hypertension, tobacco abuse, alcohol abuse, drug abuse, medication noncompliance, who presents with headache, nausea, vomiting and elevated blood pressure Patient reports that she ran out of her blood pressure medications for more than 2 weeks. She developed severe headache today. It is associated with nausea and vomiting. She vomited 6 times without blood in the vomitus. Her headache is located in the frontal area, persistent, nonradiating. She did not fall or hit her head. She sees clear spots in her eyes which has resolved. No loss of vision. Patient does not have chest pain, shortness of breath. No unilateral weakness, numbness or tingling sensations. She has mild cough due to smoking, which has not changed. Currently patient denies fever, chills, running nose, ear pain, headaches, abdominal pain, diarrhea, constipation, dysuria, urgency, frequency, hematuria, skin rashes, joint pain or leg swelling. In ED, patient was found to have blood pressure 242/142, WBC 12.9, temperature normal, tachycardia, potassium 2.8, AKI. CT head is negative for acute intracranial abnormalities, but with sinusitis. Patient is admitted to inpatient for further evaluation and treatment.   Assessment & Plan   Hypertensive Emergency/Unc Hypertension -Resolved -Likely secondary to medication noncompliance (patient had lisinopril/HCTZ on home meds, however, was not taking them) -Upon admission, BP 242/142, currently 156/110 -Patient was placed on  Cleviprex drip -Continue amlodipine, hydralazine -Will speak with Case management regarding medication access -trop neg -Avoiding BB as patient has had HR in higher 50s -Avoiding ACEI/ARB and diuretics due to possible CKD  Acute kidney injury vs CKD -Creatinine 1.72 (was 1.69 in 2015, however, normal prior to that ) -Renal US: Echogenic renal parenchyma, no hydronephrosis -Spoke with Dr. Justin Mend, recommended outpatient follow up. Patient likely has CKD due to uncontrolled HTN -Avoid nephrotoxic agents -HbA1c 5.2 -Urine Cr 159  Tobacco/Polysubstance absuse -Drug screen +THC -counseling given  Hypokalemia -Resolved, Possibly secondary to GI losses -Monitor BMP  Nausea/vomiting -Appear to have resolved, likely secondary to HTN -Continue antiemetics as needed  Hyperlipidemia -lipid panel: TC240,TG 74, HDL 96, LDL 129 -Statin started  Code Status: Full  Family Communication: Family at bedside  Disposition Plan: Admitted, continue to monitor Cr and BP.  Will transfer out of step down to tele.   Time Spent in minutes   30 minutes  Procedures  Renal US  Consults   None  DVT Prophylaxis  Heparin  Lab Results  Component Value Date   PLT 99* 01/14/2015    Medications  Scheduled Meds: . amLODipine  10 mg Oral Daily  . guaiFENesin  600 mg Oral BID  . heparin  5,000 Units Subcutaneous 3 times per day  .  hydrALAZINE  25 mg Oral 3 times per day  . nicotine  21 mg Transdermal Daily  . simvastatin  10 mg Oral q1800  . sodium chloride  3 mL Intravenous Q12H   Continuous Infusions: . clevidipine Stopped (01/15/15 0115)   PRN Meds:.acetaminophen **OR** acetaminophen, alum & mag hydroxide-simeth  Antibiotics    Anti-infectives    None        Subjective:   Phillips Odor seen and examined today.  Patient denies headache, nausea, vomiting, abdominal pain. Denies chest pain, shortness of breath. States she gets a headache when her BP spikes.   Objective:    Filed Vitals:   01/16/15 1207 01/16/15 1212 01/16/15 1315 01/16/15 1320  BP: 162/103 153/107 156/107 156/110  Pulse:      Temp:      TempSrc:      Resp:      Height:      Weight:      SpO2:        Wt Readings from Last 3 Encounters:  01/15/15 58.9 kg (129 lb 13.6 oz)  04/21/12 71.215 kg (157 lb)  08/28/11 70.308 kg (155 lb)     Intake/Output Summary (Last 24 hours) at 01/16/15 1328 Last data filed at 01/16/15 0900  Gross per 24 hour  Intake   1740 ml  Output      0 ml  Net   1740 ml    Exam  General: Well developed, well nourished, No distress  HEENT: NCAT,mucous membranes moist.   Cardiovascular: S1 S2 auscultated, RRR, no murmurs  Respiratory: Clear to auscultation  Abdomen: Soft, nontender, nondistended, + bowel sounds  Extremities: warm dry without cyanosis clubbing or edema  Neuro: AAOx3, nonfocal  Psych: Normal affect and demeanor, pleasant    Data Review   Micro Results Recent Results (from the past 240 hour(s))  MRSA PCR Screening     Status: None   Collection Time: 01/15/15 12:12 AM  Result Value Ref Range Status   MRSA by PCR NEGATIVE NEGATIVE Final    Comment:        The GeneXpert MRSA Assay (FDA approved for NASAL specimens only), is one component of a comprehensive MRSA colonization surveillance program. It is not intended to diagnose MRSA infection nor to guide or monitor treatment for MRSA infections.     Radiology Reports Ct Head Wo Contrast  01/14/2015   CLINICAL DATA:  Hypertension.  Headache and dizziness.  EXAM: CT HEAD WITHOUT CONTRAST  TECHNIQUE: Contiguous axial images were obtained from the base of the skull through the vertex without intravenous contrast.  COMPARISON:  03/14/2014  FINDINGS: The brainstem, cerebellum, cerebral peduncles, thalamus, basal ganglia, basilar cisterns, and ventricular system appear within normal limits. No intracranial hemorrhage, mass lesion, or acute CVA.  Minimal chronic ethmoid and  sphenoid sinusitis.  IMPRESSION: 1. Minimal chronic ethmoid and sphenoid sinusitis. Otherwise, no significant abnormalities are observed.   Electronically Signed   By: Van Clines M.D.   On: 01/14/2015 20:03   US Renal  01/15/2015   CLINICAL DATA:  Acute kidney injury.  History of hypertension.  EXAM: RENAL / URINARY TRACT ULTRASOUND COMPLETE  COMPARISON:  CT of the abdomen and pelvis on 07/30/2009  FINDINGS: Right Kidney:  Length: 10.6 cm. Echogenic renal parenchyma. No focal mass or hydronephrosis.  Left Kidney:  Length: 10.1 cm. Mildly echogenic. No focal mass or hydronephrosis.  Bladder:  Appears normal for degree of bladder distention.  IMPRESSION: 1. Echogenic renal parenchyma. 2. No hydronephrosis.  Electronically Signed   By: Nolon Nations M.D.   On: 01/15/2015 11:48    CBC  Recent Labs Lab 01/14/15 1844  WBC 12.9*  HGB 15.3*  HCT 42.0  PLT 99*  MCV 92.1  MCH 33.6  MCHC 36.4*  RDW 13.2    Chemistries   Recent Labs Lab 01/14/15 1844 01/15/15 0245 01/15/15 1158 01/16/15 0244  NA 136 133*  --  137  K 2.8* 2.8* 3.0* 3.5  CL 97* 98*  --  108  CO2 22 26  --  22  GLUCOSE 81 158*  --  85  BUN 16 19  --  16  CREATININE 1.66* 1.89*  --  1.72*  CALCIUM 10.5* 9.3  --  9.1  MG  --  2.0  --   --   AST  --  23  --   --   ALT  --  15  --   --   ALKPHOS  --  40  --   --   BILITOT  --  1.0  --   --    ------------------------------------------------------------------------------------------------------------------ estimated creatinine clearance is 40 mL/min (by C-G formula based on Cr of 1.72). ------------------------------------------------------------------------------------------------------------------  Recent Labs  01/15/15 0245  HGBA1C 5.2   ------------------------------------------------------------------------------------------------------------------  Recent Labs  01/15/15 0245  CHOL 240*  HDL 96  LDLCALC 129*  TRIG 74  CHOLHDL 2.5    ------------------------------------------------------------------------------------------------------------------ No results for input(s): TSH, T4TOTAL, T3FREE, THYROIDAB in the last 72 hours.  Invalid input(s): FREET3 ------------------------------------------------------------------------------------------------------------------ No results for input(s): VITAMINB12, FOLATE, FERRITIN, TIBC, IRON, RETICCTPCT in the last 72 hours.  Coagulation profile No results for input(s): INR, PROTIME in the last 168 hours.  No results for input(s): DDIMER in the last 72 hours.  Cardiac Enzymes No results for input(s): CKMB, TROPONINI, MYOGLOBIN in the last 168 hours.  Invalid input(s): CK ------------------------------------------------------------------------------------------------------------------ Invalid input(s): POCBNP    Oniya Mandarino D.O. on 01/16/2015 at 1:28 PM  Between 7am to 7pm - Pager - [phone]  After 7pm go to [email or web address] - password [email or web address]  And look for the night coverage person covering for me after hours  [facility] Office  [phone]

## 2015-01-16 NOTE — Discharge Instructions (Signed)
Smoking Cessation Quitting smoking is important to your health and has many advantages. However, it is not always easy to quit since nicotine is a very addictive drug. Oftentimes, people try 3 times or more before being able to quit. This document explains the best ways for you to prepare to quit smoking. Quitting takes hard work and a lot of effort, but you can do it. ADVANTAGES OF QUITTING SMOKING  You will live longer, feel better, and live better.  Your body will feel the impact of quitting smoking almost immediately.  Within 20 minutes, blood pressure decreases. Your pulse returns to its normal level.  After 8 hours, carbon monoxide levels in the blood return to normal. Your oxygen level increases.  After 24 hours, the chance of having a heart attack starts to decrease. Your breath, hair, and body stop smelling like smoke.  After 48 hours, damaged nerve endings begin to recover. Your sense of taste and smell improve.  After 72 hours, the body is virtually free of nicotine. Your bronchial tubes relax and breathing becomes easier.  After 2 to 12 weeks, lungs can hold more air. Exercise becomes easier and circulation improves.  The risk of having a heart attack, stroke, cancer, or lung disease is greatly reduced.  After 1 year, the risk of coronary heart disease is cut in half.  After 5 years, the risk of stroke falls to the same as a nonsmoker.  After 10 years, the risk of lung cancer is cut in half and the risk of other cancers decreases significantly.  After 15 years, the risk of coronary heart disease drops, usually to the level of a nonsmoker.  If you are pregnant, quitting smoking will improve your chances of having a healthy baby.  The people you live with, especially any children, will be healthier.  You will have extra money to spend on things other than cigarettes. QUESTIONS TO THINK ABOUT BEFORE ATTEMPTING TO QUIT You may want to talk about your answers with your  health care provider.  Why do you want to quit?  If you tried to quit in the past, what helped and what did not?  What will be the most difficult situations for you after you quit? How will you plan to handle them?  Who can help you through the tough times? Your family? Friends? A health care provider?  What pleasures do you get from smoking? What ways can you still get pleasure if you quit? Here are some questions to ask your health care provider:  How can you help me to be successful at quitting?  What medicine do you think would be best for me and how should I take it?  What should I do if I need more help?  What is smoking withdrawal like? How can I get information on withdrawal? GET READY  Set a quit date.  Change your environment by getting rid of all cigarettes, ashtrays, matches, and lighters in your home, car, or work. Do not let people smoke in your home.  Review your past attempts to quit. Think about what worked and what did not. GET SUPPORT AND ENCOURAGEMENT You have a better chance of being successful if you have help. You can get support in many ways.  Tell your family, friends, and coworkers that you are going to quit and need their support. Ask them not to smoke around you.  Get individual, group, or telephone counseling and support. Programs are available at [employer] and health centers. Call  your local health department for information about programs in your area.  Spiritual beliefs and practices may help some smokers quit.  Download a "quit meter" on your computer to keep track of quit statistics, such as how long you have gone without smoking, cigarettes not smoked, and money saved.  Get a self-help book about quitting smoking and staying off tobacco. Nash yourself from urges to smoke. Talk to someone, go for a walk, or occupy your time with a task.  Change your normal routine. Take a different route to work.  Drink tea instead of coffee. Eat breakfast in a different place.  Reduce your stress. Take a hot bath, exercise, or read a book.  Plan something enjoyable to do every day. Reward yourself for not smoking.  Explore interactive web-based programs that specialize in helping you quit. GET MEDICINE AND USE IT CORRECTLY Medicines can help you stop smoking and decrease the urge to smoke. Combining medicine with the above behavioral methods and support can greatly increase your chances of successfully quitting smoking.  Nicotine replacement therapy helps deliver nicotine to your body without the negative effects and risks of smoking. Nicotine replacement therapy includes nicotine gum, lozenges, inhalers, nasal sprays, and skin patches. Some may be available over-the-counter and others require a prescription.  Antidepressant medicine helps people abstain from smoking, but how this works is unknown. This medicine is available by prescription.  Nicotinic receptor partial agonist medicine simulates the effect of nicotine in your brain. This medicine is available by prescription. Ask your health care provider for advice about which medicines to use and how to use them based on your health history. Your health care provider will tell you what side effects to look out for if you choose to be on a medicine or therapy. Carefully read the information on the package. Do not use any other product containing nicotine while using a nicotine replacement product.  RELAPSE OR DIFFICULT SITUATIONS Most relapses occur within the first 3 months after quitting. Do not be discouraged if you start smoking again. Remember, most people try several times before finally quitting. You may have symptoms of withdrawal because your body is used to nicotine. You may crave cigarettes, be irritable, feel very hungry, cough often, get headaches, or have difficulty concentrating. The withdrawal symptoms are only temporary. They are strongest  when you first quit, but they will go away within 10-14 days. To reduce the chances of relapse, try to:  Avoid drinking alcohol. Drinking lowers your chances of successfully quitting.  Reduce the amount of caffeine you consume. Once you quit smoking, the amount of caffeine in your body increases and can give you symptoms, such as a rapid heartbeat, sweating, and anxiety.  Avoid smokers because they can make you want to smoke.  Do not let weight gain distract you. Many smokers will gain weight when they quit, usually less than 10 pounds. Eat a healthy diet and stay active. You can always lose the weight gained after you quit.  Find ways to improve your mood other than smoking. FOR MORE INFORMATION  www.smokefree.gov  Document Released: 08/05/2001 Document Revised: 12/26/2013 Document Reviewed: 11/20/2011 [facility] Patient Information 2015 [place], [place]. This information is not intended to replace advice given to you by your health care provider. Make sure you discuss any questions you have with your health care provider. Hypertension Hypertension, commonly called high blood pressure, is when the force of blood pumping through your arteries is too strong. Your arteries  are the blood vessels that carry blood from your heart throughout your body. A blood pressure reading consists of a higher number over a lower number, such as 110/72. The higher number (systolic) is the pressure inside your arteries when your heart pumps. The lower number (diastolic) is the pressure inside your arteries when your heart relaxes. Ideally you want your blood pressure below 120/80. Hypertension forces your heart to work harder to pump blood. Your arteries may become narrow or stiff. Having hypertension puts you at risk for heart disease, stroke, and other problems.  RISK FACTORS Some risk factors for high blood pressure are controllable. Others are not.  Risk factors you cannot control include:   Race. You may be at  higher risk if you are African American.  Age. Risk increases with age.  Gender. Men are at higher risk than women before age 52 years. After age 17, women are at higher risk than men. Risk factors you can control include:  Not getting enough exercise or physical activity.  Being overweight.  Getting too much fat, sugar, calories, or salt in your diet.  Drinking too much alcohol. SIGNS AND SYMPTOMS Hypertension does not usually cause signs or symptoms. Extremely high blood pressure (hypertensive crisis) may cause headache, anxiety, shortness of breath, and nosebleed. DIAGNOSIS  To check if you have hypertension, your health care provider will measure your blood pressure while you are seated, with your arm held at the level of your heart. It should be measured at least twice using the same arm. Certain conditions can cause a difference in blood pressure between your right and left arms. A blood pressure reading that is higher than normal on one occasion does not mean that you need treatment. If one blood pressure reading is high, ask your health care provider about having it checked again. TREATMENT  Treating high blood pressure includes making lifestyle changes and possibly taking medicine. Living a healthy lifestyle can help lower high blood pressure. You may need to change some of your habits. Lifestyle changes may include:  Following the DASH diet. This diet is high in fruits, vegetables, and whole grains. It is low in salt, red meat, and added sugars.  Getting at least 2 hours of brisk physical activity every week.  Losing weight if necessary.  Not smoking.  Limiting alcoholic beverages.  Learning ways to reduce stress. If lifestyle changes are not enough to get your blood pressure under control, your health care provider may prescribe medicine. You may need to take more than one. Work closely with your health care provider to understand the risks and benefits. HOME CARE  INSTRUCTIONS  Have your blood pressure rechecked as directed by your health care provider.   Take medicines only as directed by your health care provider. Follow the directions carefully. Blood pressure medicines must be taken as prescribed. The medicine does not work as well when you skip doses. Skipping doses also puts you at risk for problems.   Do not smoke.   Monitor your blood pressure at home as directed by your health care provider. SEEK MEDICAL CARE IF:   You think you are having a reaction to medicines taken.  You have recurrent headaches or feel dizzy.  You have swelling in your ankles.  You have trouble with your vision. SEEK IMMEDIATE MEDICAL CARE IF:  You develop a severe headache or confusion.  You have unusual weakness, numbness, or feel faint.  You have severe chest or abdominal pain.  You vomit repeatedly.  You have trouble breathing. MAKE SURE YOU:   Understand these instructions.  Will watch your condition.  Will get help right away if you are not doing well or get worse. Document Released: 08/11/2005 Document Revised: 12/26/2013 Document Reviewed: 06/03/2013 [facility] Patient Information 2015 [place], [place]. This information is not intended to replace advice given to you by your health care provider. Make sure you discuss any questions you have with your health care provider.

## 2015-01-16 NOTE — Progress Notes (Signed)
Report called to Apolonio Schneiders, RN on [facility]. Patient transferred with no complications.

## 2015-01-16 NOTE — Progress Notes (Signed)
NURSING PROGRESS NOTE  Melody [name] [id number] Transfer Data: 01/16/2015 3:57 PM Attending Provider: Cristal Ford, DO PCP:No PCP Per Patient Code Status: FULL  Melody [name] is a 39 y.o. female patient transferred from [place] -No acute distress noted.  -No complaints of shortness of breath.  -No complaints of chest pain.   Cardiac Monitoring: Box # 14 in place. Cardiac monitor yields:normal sinus rhythm.  Blood pressure 147/93, pulse 109, temperature 98.3 F (36.8 C), temperature source Oral, resp. rate 16, height 5\' 6"  (1.676 m), weight 63.2 kg (139 lb 5.3 oz), last menstrual period 01/01/2015, SpO2 100 %.   IV Fluids:  IV in place, occlusive dsg intact without redness, IV cath forearm left, condition patent and no redness none.   Allergies:  Review of patient's allergies indicates no known allergies.  Past Medical History:   has a past medical history of Ectopic pregnancy; Hypertension; Tobacco abuse; Alcohol abuse; and Drug abuse.  Past Surgical History:   has past surgical history that includes Dilation and curettage of uterus and Cesarean section.  Social History:   reports that she has been smoking Cigars and Cigarettes.  She has been smoking about 0.50 packs per day. She has never used smokeless tobacco. She reports that she drinks about 4.5 oz of alcohol per week. She reports that she uses illicit drugs (Marijuana).  Skin: intact   Patient/Family orientated to room. Information packet given to patient/family. Admission inpatient armband information verified with patient/family to include name and date of birth and placed on patient arm. Side rails up x 2, fall assessment and education completed with patient/family. Patient/family able to verbalize understanding of risk associated with falls and verbalized understanding to call for assistance before getting out of bed. Call light within reach. Patient/family able to voice and demonstrate understanding of unit  orientation instructions.    Will continue to evaluate and treat per MD orders.

## 2015-01-16 NOTE — Progress Notes (Signed)
Report received from [place], [place] for transfer to [phone]

## 2015-01-16 NOTE — Progress Notes (Signed)
Patient's BP continues to increase throughout the day despite new orders for hydralazine PO and 10 mg dose of IV lasix. Current BP 173/103. Patient only complains of a slight headache that she rates 2/10. Dr. Ree Kida made aware, new orders for hydralazine IV PRN. Will continue to monitor closely.

## 2015-01-16 NOTE — Progress Notes (Signed)
Pt SBP 170's DBP 100's and complaining of head ache. [facility] notified and orders placed to help decrease head ache pain. Upon reassessment pts BP was 160's/100's. Orders placed by [facility] to give 5mg  hydralazine. Will continue to monitor pt.

## 2015-01-17 DIAGNOSIS — Z720 Tobacco use: Secondary | ICD-10-CM

## 2015-01-17 DIAGNOSIS — F1910 Other psychoactive substance abuse, uncomplicated: Secondary | ICD-10-CM

## 2015-01-17 DIAGNOSIS — I10 Essential (primary) hypertension: Secondary | ICD-10-CM

## 2015-01-17 DIAGNOSIS — N179 Acute kidney failure, unspecified: Secondary | ICD-10-CM

## 2015-01-17 DIAGNOSIS — E876 Hypokalemia: Secondary | ICD-10-CM

## 2015-01-17 LAB — CBC
HCT: 35.9 % — ABNORMAL LOW (ref 36.0–46.0)
HEMOGLOBIN: 12.1 g/dL (ref 12.0–15.0)
MCH: 32.2 pg (ref 26.0–34.0)
MCHC: 33.7 g/dL (ref 30.0–36.0)
MCV: 95.5 fL (ref 78.0–100.0)
PLATELETS: 116 10*3/uL — AB (ref 150–400)
RBC: 3.76 MIL/uL — ABNORMAL LOW (ref 3.87–5.11)
RDW: 13.7 % (ref 11.5–15.5)
WBC: 8.7 10*3/uL (ref 4.0–10.5)

## 2015-01-17 LAB — BASIC METABOLIC PANEL
Anion gap: 9 (ref 5–15)
BUN: 17 mg/dL (ref 6–20)
CO2: 23 mmol/L (ref 22–32)
CREATININE: 1.75 mg/dL — AB (ref 0.44–1.00)
Calcium: 9.1 mg/dL (ref 8.9–10.3)
Chloride: 105 mmol/L (ref 101–111)
GFR calc Af Amer: 41 mL/min — ABNORMAL LOW (ref >60)
GFR calc non Af Amer: 35 mL/min — ABNORMAL LOW (ref >60)
GLUCOSE: 90 mg/dL (ref 65–99)
Potassium: 3.5 mmol/L (ref 3.5–5.1)
Sodium: 137 mmol/L (ref 135–145)

## 2015-01-17 LAB — URINALYSIS, ROUTINE W REFLEX MICROSCOPIC
Bilirubin Urine: NEGATIVE
Glucose, UA: NEGATIVE mg/dL
KETONES UR: NEGATIVE mg/dL
Nitrite: POSITIVE — AB
Protein, ur: NEGATIVE mg/dL
Specific Gravity, Urine: 1.013 (ref 1.005–1.030)
Urobilinogen, UA: 1 mg/dL (ref 0.0–1.0)
pH: 5.5 (ref 5.0–8.0)

## 2015-01-17 LAB — URINE MICROSCOPIC-ADD ON

## 2015-01-17 LAB — GLUCOSE, CAPILLARY: GLUCOSE-CAPILLARY: 102 mg/dL — AB (ref 65–99)

## 2015-01-17 MED ORDER — NICOTINE 21 MG/24HR TD PT24: 21.0000 mg | MEDICATED_PATCH | Freq: Every day | TRANSDERMAL | Status: DC

## 2015-01-17 MED ORDER — TRAMADOL HCL 50 MG PO TABS: 50.0000 mg | ORAL_TABLET | Freq: Three times a day (TID) | ORAL | Status: DC | PRN

## 2015-01-17 MED ORDER — CIPROFLOXACIN HCL 250 MG PO TABS: 250.0000 mg | ORAL_TABLET | Freq: Two times a day (BID) | ORAL | Status: DC

## 2015-01-17 MED ORDER — METOPROLOL TARTRATE 25 MG PO TABS: 25.0000 mg | ORAL_TABLET | Freq: Two times a day (BID) | ORAL | Status: DC

## 2015-01-17 MED ORDER — SIMVASTATIN 10 MG PO TABS: 10.0000 mg | ORAL_TABLET | Freq: Every day | ORAL | Status: DC

## 2015-01-17 MED ORDER — AMLODIPINE BESYLATE 10 MG PO TABS: 10.0000 mg | ORAL_TABLET | Freq: Every day | ORAL | Status: DC

## 2015-01-17 MED ORDER — HYDRALAZINE HCL 50 MG PO TABS
50.0000 mg | ORAL_TABLET | Freq: Three times a day (TID) | ORAL | Status: DC
Start: 2015-01-17 — End: 2015-01-17
  Administered 2015-01-17: 50 mg via ORAL
  Filled 2015-01-17 (×4): qty 1

## 2015-01-17 MED ORDER — HYDRALAZINE HCL 50 MG PO TABS: 50.0000 mg | ORAL_TABLET | Freq: Three times a day (TID) | ORAL | Status: DC

## 2015-01-17 MED ORDER — DIPHENHYDRAMINE HCL 50 MG/ML IJ SOLN
25.0000 mg | Freq: Once | INTRAMUSCULAR | Status: AC
  Administered 2015-01-17: 25 mg via INTRAVENOUS
  Filled 2015-01-17: qty 1

## 2015-01-17 MED ORDER — CIPROFLOXACIN HCL 250 MG PO TABS
250.0000 mg | ORAL_TABLET | Freq: Two times a day (BID) | ORAL | Status: DC
  Administered 2015-01-17: 250 mg via ORAL
  Filled 2015-01-17 (×3): qty 1

## 2015-01-17 MED ORDER — HYDRALAZINE HCL 50 MG PO TABS: 25.0000 mg | ORAL_TABLET | Freq: Three times a day (TID) | ORAL | Status: DC

## 2015-01-17 MED ORDER — METRONIDAZOLE 500 MG PO TABS: 500.0000 mg | ORAL_TABLET | Freq: Two times a day (BID) | ORAL | Status: DC

## 2015-01-17 MED ORDER — METRONIDAZOLE 500 MG PO TABS
500.0000 mg | ORAL_TABLET | Freq: Two times a day (BID) | ORAL | Status: DC
  Administered 2015-01-17: 500 mg via ORAL
  Filled 2015-01-17 (×2): qty 1

## 2015-01-17 MED ORDER — METOPROLOL TARTRATE 25 MG PO TABS
25.0000 mg | ORAL_TABLET | Freq: Two times a day (BID) | ORAL | Status: DC
  Administered 2015-01-17: 25 mg via ORAL
  Filled 2015-01-17 (×2): qty 1

## 2015-01-17 MED ORDER — METOCLOPRAMIDE HCL 5 MG/ML IJ SOLN
10.0000 mg | Freq: Once | INTRAMUSCULAR | Status: AC
  Administered 2015-01-17: 10 mg via INTRAVENOUS
  Filled 2015-01-17: qty 2

## 2015-01-17 NOTE — Progress Notes (Signed)
Melody [name] to be D/C'd Home per MD order.  Discussed with the patient and all questions fully answered.  VSS, Skin clean, dry and intact without evidence of skin break down, no evidence of skin tears noted. IV catheter discontinued intact. Site without signs and symptoms of complications. Dressing and pressure applied.  An After Visit Summary was printed and given to the patient. Patient received prescription.   Patient instructed to return to ED, call 911, or call MD for any changes in condition.   Patient escorted via [place], and D/C home via private auto.  L'ESPERANCE, Dorise Gangi C 01/17/2015 1:03 PM

## 2015-01-17 NOTE — Discharge Summary (Signed)
Physician Discharge Summary   Patient ID: Melody [name] MRN: [id number] DOB/AGE: Dec 22, 1975 39 y.o.  Admit date: 01/14/2015 Discharge date: 01/17/2015  Primary Care Physician:  No PCP Per Patient  Discharge Diagnoses:   . Hypertensive emergency . Tobacco abuse . Hypertension . Drug abuse . AKI (acute kidney injury)  UTI  Trichomonas in UA  Consults:  None    Recommendations for Outpatient Follow-up:  Please follow the urine culture and sensitivity results  Patient was treated for Trichomonas with Flagyl for 7 days, her partner was also advised to seek treatment from his primary care physician   TESTS THAT NEED FOLLOW-UP BMET   DIET: Heart healthy diet    Allergies:  No Known Allergies   Discharge Medications:   Medication List    STOP taking these medications        hydrochlorothiazide 25 MG tablet  Commonly known as:  HYDRODIURIL     ibuprofen 200 MG tablet  Commonly known as:  ADVIL,MOTRIN     lisinopril 10 MG tablet  Commonly known as:  PRINIVIL,ZESTRIL      TAKE these medications        amLODipine 10 MG tablet  Commonly known as:  NORVASC  Take 1 tablet (10 mg total) by mouth daily.     ciprofloxacin 250 MG tablet  Commonly known as:  CIPRO  Take 1 tablet (250 mg total) by mouth 2 (two) times daily. X 3 days     hydrALAZINE 50 MG tablet  Commonly known as:  APRESOLINE  Take 1 tablet (50 mg total) by mouth 3 (three) times daily.     metoprolol tartrate 25 MG tablet  Commonly known as:  LOPRESSOR  Take 1 tablet (25 mg total) by mouth 2 (two) times daily.     metroNIDAZOLE 500 MG tablet  Commonly known as:  FLAGYL  Take 1 tablet (500 mg total) by mouth 2 (two) times daily. X 7days     nicotine 21 mg/24hr patch  Commonly known as:  NICODERM CQ - dosed in mg/24 hours  Place 1 patch (21 mg total) onto the skin daily.     simvastatin 10 MG tablet  Commonly known as:  ZOCOR  Take 1 tablet (10 mg total) by mouth at bedtime.     traMADol 50 MG tablet  Commonly known as:  ULTRAM  Take 1 tablet (50 mg total) by mouth every 8 (eight) hours as needed for moderate pain (headache).         Brief H and P: For complete details please refer to admission H and P, but in brief Melody [name] is a 39 y.o. female with PMH of hypertension, tobacco abuse, alcohol abuse, drug abuse, medication noncompliance, who presents with headache, nausea, vomiting and elevated blood pressure Patient reported that she ran out of her blood pressure medications for more than 2 weeks. She developed severe headache today. It was associated with nausea and vomiting. She vomited 6 times without blood in the vomitus. Her headache is located in the frontal area, persistent, nonradiating. She did not fall or hit her head. She reported clear spots in her eyes which has resolved. No loss of vision. Patient does not have chest pain, shortness of breath. No unilateral weakness, numbness or tingling sensations. She has mild cough due to smoking, which has not changed. In ED, patient was found to have blood pressure 242/142, WBC 12.9, temperature normal, tachycardia, potassium 2.8, AKI. CT head is negative for acute intracranial abnormalities, but  with sinusitis. Patient is admitted to inpatient for further evaluation and treatment.   Hospital Course:   Hypertensive Emergency/Uncontrolled Hypertension-Resolved, Likely secondary to medication noncompliance (patient had lisinopril/HCTZ on home meds, however, was not taking them). Upon admission, BP 242/142, now improved 146/99 at the time of discharge. Patient was placed on Cleviprex drip. She was placed on amlodipine, hydralazine which was titrated up and added metoprolol as well. Troponins remained negative. Her heart rate has remained in 90s in the last 24 hours hence started on low-dose beta blocker to control BP. Avoiding ACEI/ARB and diuretics due to possible CKD.   Acute kidney injury vs CKD -Creatinine  1.72 (was 1.69 in 2015, however, normal prior to that ). Renal ultrasound showed Echogenic renal parenchyma, no hydronephrosis. Dr. Justin Mend was consulted and recommended outpatient follow-up, likely patient has CKD due to uncontrolled HTN HbA1c 5.2, Urine Cr 159  Tobacco/Polysubstance absuse -Drug screen +THC, patient was strongly counseled. Given nicotine patch to quit smoking. -counseling given  Hypokalemia -Resolved  Nausea/vomiting -Appear to have resolved, likely secondary to HTN -Continue antiemetics as needed  Hyperlipidemia -lipid panel: TC240,TG 74, HDL 96, LDL 129 -Statin started  UTI with Trichomonas Patient was started on ciprofloxacin for 3 days and treated for trichomonas with Flagyl for 7 days. HIV was negative. Her partner was also advised to seek medical treatment for trichomonas from his PCP.   Day of Discharge BP 146/99 mmHg  Pulse 81  Temp(Src) 98.7 F (37.1 C) (Oral)  Resp 18  Ht 5\' 6"  (1.676 m)  Wt 63.2 kg (139 lb 5.3 oz)  BMI 22.50 kg/m2  SpO2 100%  LMP 01/01/2015  Physical Exam: General: Alert and awake oriented x3 not in any acute distress. HEENT: anicteric sclera, pupils reactive to light and accommodation CVS: S1-S2 clear no murmur rubs or gallops Chest: clear to auscultation bilaterally, no wheezing rales or rhonchi Abdomen: soft nontender, nondistended, normal bowel sounds Extremities: no cyanosis, clubbing or edema noted bilaterally Neuro: Cranial nerves II-XII intact, no focal neurological deficits   The results of significant diagnostics from this hospitalization (including imaging, microbiology, ancillary and laboratory) are listed below for reference.    LAB RESULTS: Basic Metabolic Panel:  Recent Labs Lab 01/15/15 0245  01/16/15 0244 01/17/15 0735  NA 133*  --  137 137  K 2.8*  < > 3.5 3.5  CL 98*  --  108 105  CO2 26  --  22 23  GLUCOSE 158*  --  85 90  BUN 19  --  16 17  CREATININE 1.89*  --  1.72* 1.75*  CALCIUM 9.3  --   9.1 9.1  MG 2.0  --   --   --   < > = values in this interval not displayed. Liver Function Tests:  Recent Labs Lab 01/15/15 0245  AST 23  ALT 15  ALKPHOS 40  BILITOT 1.0  PROT 6.5  ALBUMIN 3.7   No results for input(s): LIPASE, AMYLASE in the last 168 hours. No results for input(s): AMMONIA in the last 168 hours. CBC:  Recent Labs Lab 01/14/15 1844 01/17/15 0735  WBC 12.9* 8.7  HGB 15.3* 12.1  HCT 42.0 35.9*  MCV 92.1 95.5  PLT 99* 116*   Cardiac Enzymes: No results for input(s): CKTOTAL, CKMB, CKMBINDEX, TROPONINI in the last 168 hours. BNP: Invalid input(s): POCBNP CBG:  Recent Labs Lab 01/16/15 0815 01/17/15 0814  GLUCAP 111* 102*    Significant Diagnostic Studies:  Ct Head Wo Contrast  01/14/2015  CLINICAL DATA:  Hypertension.  Headache and dizziness.  EXAM: CT HEAD WITHOUT CONTRAST  TECHNIQUE: Contiguous axial images were obtained from the base of the skull through the vertex without intravenous contrast.  COMPARISON:  03/14/2014  FINDINGS: The brainstem, cerebellum, cerebral peduncles, thalamus, basal ganglia, basilar cisterns, and ventricular system appear within normal limits. No intracranial hemorrhage, mass lesion, or acute CVA.  Minimal chronic ethmoid and sphenoid sinusitis.  IMPRESSION: 1. Minimal chronic ethmoid and sphenoid sinusitis. Otherwise, no significant abnormalities are observed.   Electronically Signed   By: Van Clines M.D.   On: 01/14/2015 20:03   US Renal  01/15/2015   CLINICAL DATA:  Acute kidney injury.  History of hypertension.  EXAM: RENAL / URINARY TRACT ULTRASOUND COMPLETE  COMPARISON:  CT of the abdomen and pelvis on 07/30/2009  FINDINGS: Right Kidney:  Length: 10.6 cm. Echogenic renal parenchyma. No focal mass or hydronephrosis.  Left Kidney:  Length: 10.1 cm. Mildly echogenic. No focal mass or hydronephrosis.  Bladder:  Appears normal for degree of bladder distention.  IMPRESSION: 1. Echogenic renal parenchyma. 2. No  hydronephrosis.   Electronically Signed   By: Nolon Nations M.D.   On: 01/15/2015 11:48    2D ECHO:   Disposition and Follow-up:     Discharge Instructions    Diet - low sodium heart healthy    Complete by:  As directed      Increase activity slowly    Complete by:  As directed             DISPOSITION: Home   DISCHARGE FOLLOW-UP Follow-up Information    Follow up with Sherril Croon, MD. Schedule an appointment as soon as possible for a visit on 01/31/2015.   Specialty:  Nephrology   Why:  Hospital follow up, repeat BMP. NS left message for "PAM" scheduler to call unit phone back [phone]. or to call patient back at [phone] (home phone)   Contact information:   [place] [address] [phone]       Follow up with [place]. Schedule an appointment as soon as possible for a visit in 1 week.   Why:  Hospital follow up, repeat BMP. NS called to schedule APPT and was unable due to long hold/wait time. Patient or family member of patient must call and schedule APPT.   Contact information:   [address] [place] [phone]       Time spent on Discharge: 35 minutes  Signed:   Shahiem Bedwell M.D. [facility] 01/17/2015, 3:52 PM Pager: [phone]

## 2015-01-17 NOTE — Progress Notes (Signed)
Pt ready to be discharged to home. Pt. Is alert and oriented. Pt is hemodynamically stable. AVS reviewed with pt. Capable of re verbalizing medication regimen. IV taken out. Discharge plan appropriate and in place.

## 2015-01-17 NOTE — Progress Notes (Signed)
Patient received pamphlet from [facility]. CM explained to patient that they may use the on site pharmacy to fill prescriptions given to them at discharge. Patient aware that the [facility] will not fill narcotics or pain medications prior to the patient being seen by one of their physicians.  Patient aware that they must be seen as a patient prior to the pharmacy filling the prescriptions a second time. Either follow up appointment date and time entered into AVS, or instructions for patient to call and schedule appointment after discharge. 

## 2015-01-18 LAB — GC/CHLAMYDIA PROBE AMP (~~LOC~~) NOT AT ARMC
CHLAMYDIA, DNA PROBE: NEGATIVE
Neisseria Gonorrhea: NEGATIVE

## 2015-01-19 ENCOUNTER — Ambulatory Visit: Payer: Self-pay | Attending: Family Medicine | Admitting: Family Medicine

## 2015-01-19 VITALS — BP 162/110 | HR 92 | Wt 139.2 lb

## 2015-01-19 DIAGNOSIS — Z87891 Personal history of nicotine dependence: Secondary | ICD-10-CM | POA: Insufficient documentation

## 2015-01-19 DIAGNOSIS — I10 Essential (primary) hypertension: Secondary | ICD-10-CM | POA: Insufficient documentation

## 2015-01-19 LAB — URINE CULTURE

## 2015-01-19 MED ORDER — CLONIDINE HCL 0.1 MG PO TABS
0.2000 mg | ORAL_TABLET | Freq: Once | ORAL | Status: AC
  Administered 2015-01-19: 0.2 mg via ORAL

## 2015-01-19 NOTE — Progress Notes (Signed)
Patient ID: Cleta Alberts, female   DOB: 20-Oct-1975, 39 y.o.   MRN: [id number]   Lucindia Lathe, is a 39 y.o. female  [id number]  [id number]  DOB - [birth year]  CC:  Chief Complaint  Patient presents with  . New patient    Hypertension       HPI: Naylene Garmendia is a 39 y.o. female here today to establish medical care. She was seen in ED on 5/22 for Headaches and elevated BP and diagnosed with Hypertensive Urgency. She has been prescribed medications but has not yet started them. The symptoms she reported to the ED have resolved (headache, nausea and vomiting. Her BP was 242/142. She was admitted and BP controlled.  She was instructed to follow-up here for ongoing care. She presents today with a BP of 160/110. She has had no medication today.Blood work done in ED shows decreased renal function  No Known Allergies Past Medical History  Diagnosis Date  . Ectopic pregnancy   . Hypertension   . Tobacco abuse   . Alcohol abuse   . Drug abuse    Current Outpatient Prescriptions on File Prior to Visit  Medication Sig Dispense Refill  . amLODipine (NORVASC) 10 MG tablet Take 1 tablet (10 mg total) by mouth daily. 30 tablet 3  . ciprofloxacin (CIPRO) 250 MG tablet Take 1 tablet (250 mg total) by mouth 2 (two) times daily. X 3 days 6 tablet 0  . hydrALAZINE (APRESOLINE) 50 MG tablet Take 1 tablet (50 mg total) by mouth 3 (three) times daily. 90 tablet 3  . metoprolol tartrate (LOPRESSOR) 25 MG tablet Take 1 tablet (25 mg total) by mouth 2 (two) times daily. 60 tablet 3  . metroNIDAZOLE (FLAGYL) 500 MG tablet Take 1 tablet (500 mg total) by mouth 2 (two) times daily. X 7days 14 tablet 0  . nicotine (NICODERM CQ - DOSED IN MG/24 HOURS) 21 mg/24hr patch Place 1 patch (21 mg total) onto the skin daily. 28 patch 0  . simvastatin (ZOCOR) 10 MG tablet Take 1 tablet (10 mg total) by mouth at bedtime. 30 tablet 2  . traMADol (ULTRAM) 50 MG tablet Take 1 tablet (50 mg total) by mouth every  8 (eight) hours as needed for moderate pain (headache). 30 tablet 0   No current facility-administered medications on file prior to visit.   Family History  Problem Relation Age of Onset  . CAD Mother   . CAD Brother   . Hypertension Other   . Kidney failure Other    History   Social History  . Marital Status: Single    Spouse Name: N/A  . Number of Children: N/A  . Years of Education: N/A   Occupational History  . Not on file.   Social History Main Topics  . Smoking status: Former Smoker -- 0.50 packs/day    Types: Cigars, Cigarettes  . Smokeless tobacco: Former Systems developer  . Alcohol Use: 5.4 oz/week    9 Standard drinks or equivalent per week     Comment: occ  . Drug Use: Yes    Special: Marijuana  . Sexual Activity: Yes   Other Topics Concern  . Not on file   Social History Narrative    Review of Systems: Constitutional: Negative for fever, chills, appetite change, weight loss,  fatigue. HENT: Negative for ear pain, ear discharge.nose bleeds Eyes: Negative for pain, discharge, redness, itching and visual disturbance. Neck: Negative for pain, stiffness Respiratory: Negative for cough, shortness of breath,   Cardiovascular:  Negative for chest pain, and leg swelling.Positive for palpitations at times. Gastrointestinal: Negative for abdominal distention, abdominal pain, nausea, vomiting, diarrhea, constipations. Nausea and vomiting has resolved. Genitourinary: Negative for dysuria, urgency, frequency, hematuria, flank pain,  Musculoskeletal: Negative for back pain, joint pain, joint  swelling, arthralgia and gait problem.Negative for weakness. Neurological: Negative for dizziness, tremors, seizures, syncope,   light-headedness, numbness. Positive for headaches that have resolved. Hematological: Negative for easy bruising or bleeding Psychiatric/Behavioral: Negative for depression, anxiety, decreased concentration, confusion   Objective:   Filed Vitals:   01/19/15  0931  BP: 171/119  Pulse: 92    Physical Exam: Constitutional: Patient appears well-developed and well-nourished. No distress. HENT: Normocephalic, atraumatic, External right and left ear normal. Oropharynx is clear and moist.  Eyes: Conjunctivae and EOM are normal. PERRLA, no scleral icterus. Neck: Normal ROM. Neck supple. No lymphadenopathy, No thyromegaly. CVS: RRR, S1/S2 +, no murmurs, no gallops, no rubs Pulmonary: Effort and breath sounds normal, no stridor, rhonchi, wheezes, rales.  Abdominal: Soft. Normoactive BS,, no distension, tenderness, rebound or guarding.  Musculoskeletal: Normal range of motion. No edema and no tenderness.  Neuro: Alert.Normal muscle tone coordination. Non-focal Skin: Skin is warm and dry. No rash noted. Not diaphoretic. No erythema. No pallor. Psychiatric: Normal mood and affect. Behavior, judgment, thought content normal.  Lab Results  Component Value Date   WBC 8.7 01/17/2015   HGB 12.1 01/17/2015   HCT 35.9* 01/17/2015   MCV 95.5 01/17/2015   PLT 116* 01/17/2015   Lab Results  Component Value Date   CREATININE 1.75* 01/17/2015   BUN 17 01/17/2015   NA 137 01/17/2015   K 3.5 01/17/2015   CL 105 01/17/2015   CO2 23 01/17/2015    Lab Results  Component Value Date   HGBA1C 5.2 01/15/2015   Lipid Panel     Component Value Date/Time   CHOL 240* 01/15/2015 0245   TRIG 74 01/15/2015 0245   HDL 96 01/15/2015 0245   CHOLHDL 2.5 01/15/2015 0245   VLDL 15 01/15/2015 0245   LDLCALC 129* 01/15/2015 0245       Assessment and plan:   Hypertension; - I have explained the importance of getting her BP meds refilled and taking them regularly - Return to see me in one week and PCP in 4 weeks -Dash diet - She has recent blood work in chart so none ordered today.  No Follow-up on file.  The patient was given clear instructions to go to ER or return to medical center if symptoms don't improve, worsen or new problems develop. The patient  verbalized understanding.       Micheline Chapman, MSN, FNP-BC [place], [place]   01/19/2015, 9:34 AM

## 2015-01-19 NOTE — Patient Instructions (Addendum)
Pick up medications (amlodipine, apresoline and metoprolol and start today. Follow DASH diet to help with BP control. Return in one week to see me for a BP check. Follow-up in one month with assigned primary provider.  Follow-up here or in ED for chest pain, shortness of breath, severe headache, nausea or vomiting.  DASH Eating Plan DASH stands for "Dietary Approaches to Stop Hypertension." The DASH eating plan is a healthy eating plan that has been shown to reduce high blood pressure (hypertension). Additional health benefits may include reducing the risk of type 2 diabetes mellitus, heart disease, and stroke. The DASH eating plan may also help with weight loss. WHAT DO I NEED TO KNOW ABOUT THE DASH EATING PLAN? For the DASH eating plan, you will follow these general guidelines:  Choose foods with a percent daily value for sodium of less than 5% (as listed on the food label).  Use salt-free seasonings or herbs instead of table salt or sea salt.  Check with your health care provider or pharmacist before using salt substitutes.  Eat lower-sodium products, often labeled as "lower sodium" or "no salt added."  Eat fresh foods.  Eat more vegetables, fruits, and low-fat dairy products.  Choose whole grains. Look for the word "whole" as the first word in the ingredient list.  Choose fish and skinless chicken or [place] more often than red meat. Limit fish, poultry, and meat to 6 oz (170 g) each day.  Limit sweets, desserts, sugars, and sugary drinks.  Choose heart-healthy fats.  Limit cheese to 1 oz (28 g) per day.  Eat more home-cooked food and less restaurant, buffet, and fast food.  Limit fried foods.  Cook foods using methods other than frying.  Limit canned vegetables. If you do use them, rinse them well to decrease the sodium.  When eating at a restaurant, ask that your food be prepared with less salt, or no salt if possible. WHAT FOODS CAN I EAT? Seek help from a dietitian  for individual calorie needs. Grains Whole grain or whole wheat bread. Brown rice. Whole grain or whole wheat pasta. Quinoa, bulgur, and whole grain cereals. Low-sodium cereals. Corn or whole wheat flour tortillas. Whole grain cornbread. Whole grain crackers. Low-sodium crackers. Vegetables Fresh or frozen vegetables (raw, steamed, roasted, or grilled). Low-sodium or reduced-sodium tomato and vegetable juices. Low-sodium or reduced-sodium tomato sauce and paste. Low-sodium or reduced-sodium canned vegetables.  Fruits All fresh, canned (in natural juice), or frozen fruits. Meat and Other Protein Products Ground beef (85% or leaner), grass-fed beef, or beef trimmed of fat. Skinless chicken or [place]. Ground chicken or [place]. Pork trimmed of fat. All fish and seafood. Eggs. Dried beans, peas, or lentils. Unsalted nuts and seeds. Unsalted canned beans. Dairy Low-fat dairy products, such as skim or 1% milk, 2% or reduced-fat cheeses, low-fat ricotta or cottage cheese, or plain low-fat yogurt. Low-sodium or reduced-sodium cheeses. Fats and Oils Tub margarines without trans fats. Light or reduced-fat mayonnaise and salad dressings (reduced sodium). Avocado. Safflower, olive, or canola oils. Natural peanut or almond butter. Other Unsalted popcorn and pretzels. The items listed above may not be a complete list of recommended foods or beverages. Contact your dietitian for more options. WHAT FOODS ARE NOT RECOMMENDED? Grains White bread. White pasta. White rice. Refined cornbread. Bagels and croissants. Crackers that contain trans fat. Vegetables Creamed or fried vegetables. Vegetables in a cheese sauce. Regular canned vegetables. Regular canned tomato sauce and paste. Regular tomato and vegetable juices. Fruits Dried fruits. Canned fruit  in light or heavy syrup. Fruit juice. Meat and Other Protein Products Fatty cuts of meat. Ribs, chicken wings, bacon, sausage, bologna, salami, chitterlings, fatback,  hot dogs, bratwurst, and packaged luncheon meats. Salted nuts and seeds. Canned beans with salt. Dairy Whole or 2% milk, cream, half-and-half, and cream cheese. Whole-fat or sweetened yogurt. Full-fat cheeses or blue cheese. Nondairy creamers and whipped toppings. Processed cheese, cheese spreads, or cheese curds. Condiments Onion and garlic salt, seasoned salt, table salt, and sea salt. Canned and packaged gravies. Worcestershire sauce. Tartar sauce. Barbecue sauce. Teriyaki sauce. Soy sauce, including reduced sodium. Steak sauce. Fish sauce. Oyster sauce. Cocktail sauce. Horseradish. Ketchup and mustard. Meat flavorings and tenderizers. Bouillon cubes. Hot sauce. Tabasco sauce. Marinades. Taco seasonings. Relishes. Fats and Oils Butter, stick margarine, lard, shortening, ghee, and bacon fat. Coconut, palm kernel, or palm oils. Regular salad dressings. Other Pickles and olives. Salted popcorn and pretzels. The items listed above may not be a complete list of foods and beverages to avoid. Contact your dietitian for more information. WHERE CAN I FIND MORE INFORMATION? National Heart, Lung, and Blood Institute: travelstabloid.com Document Released: 07/31/2011 Document Revised: 12/26/2013 Document Reviewed: 06/15/2013 [facility] Patient Information 2015 [place], [place]. This information is not intended to replace advice given to you by your health care provider. Make sure you discuss any questions you have with your health care provider.

## 2015-01-26 ENCOUNTER — Encounter: Payer: Self-pay | Admitting: Family Medicine

## 2015-01-26 ENCOUNTER — Ambulatory Visit: Payer: Self-pay | Attending: Family Medicine | Admitting: Family Medicine

## 2015-01-26 VITALS — BP 162/98 | HR 92 | Temp 98.0°F | Resp 16 | Wt 139.8 lb

## 2015-01-26 DIAGNOSIS — I10 Essential (primary) hypertension: Secondary | ICD-10-CM | POA: Insufficient documentation

## 2015-01-26 NOTE — Progress Notes (Signed)
Patient here for follow up on her blood pressure Was seen recently in the ed for elevated blood pressure Presents in office with elevated blood pressure but stated she has Not yet taken her medication today

## 2015-01-26 NOTE — Progress Notes (Signed)
Patient presented today for follow-up BP check. However, she had taken done of her BP medications today.  We had her take her medications but she was unable to wait for [place] to recheck BP after a reasonable time.  She has a appointment in one week with PCP. I have instructed her to make sure she takes her BP medications before coming.

## 2015-02-02 ENCOUNTER — Ambulatory Visit: Payer: Self-pay | Admitting: Family Medicine

## 2015-02-19 ENCOUNTER — Ambulatory Visit: Payer: Self-pay | Admitting: Family Medicine

## 2015-03-12 ENCOUNTER — Encounter: Payer: Self-pay | Admitting: Family Medicine

## 2015-03-12 ENCOUNTER — Ambulatory Visit: Payer: Self-pay | Attending: Internal Medicine | Admitting: Family Medicine

## 2015-03-12 VITALS — BP 150/100 | HR 69 | Temp 99.1°F | Resp 16 | Ht 67.0 in | Wt 148.0 lb

## 2015-03-12 DIAGNOSIS — N179 Acute kidney failure, unspecified: Secondary | ICD-10-CM

## 2015-03-12 DIAGNOSIS — I10 Essential (primary) hypertension: Secondary | ICD-10-CM | POA: Insufficient documentation

## 2015-03-12 DIAGNOSIS — Z87891 Personal history of nicotine dependence: Secondary | ICD-10-CM | POA: Insufficient documentation

## 2015-03-12 LAB — BASIC METABOLIC PANEL
BUN: 18 mg/dL (ref 6–23)
CO2: 23 mEq/L (ref 19–32)
Calcium: 9.5 mg/dL (ref 8.4–10.5)
Chloride: 109 mEq/L (ref 96–112)
Creat: 1.56 mg/dL — ABNORMAL HIGH (ref 0.50–1.10)
GLUCOSE: 91 mg/dL (ref 70–99)
Potassium: 4.3 mEq/L (ref 3.5–5.3)
Sodium: 139 mEq/L (ref 135–145)

## 2015-03-12 MED ORDER — CLONIDINE HCL 0.1 MG PO TABS
0.2000 mg | ORAL_TABLET | Freq: Once | ORAL | Status: AC
  Administered 2015-03-12: 0.2 mg via ORAL

## 2015-03-12 MED ORDER — AMLODIPINE BESYLATE 10 MG PO TABS: 10.0000 mg | ORAL_TABLET | Freq: Every day | ORAL | Status: DC

## 2015-03-12 MED ORDER — METOPROLOL TARTRATE 25 MG PO TABS: 12.5000 mg | ORAL_TABLET | Freq: Two times a day (BID) | ORAL | Status: DC

## 2015-03-12 MED ORDER — HYDRALAZINE HCL 50 MG PO TABS: 50.0000 mg | ORAL_TABLET | Freq: Three times a day (TID) | ORAL | Status: DC

## 2015-03-12 NOTE — Patient Instructions (Addendum)
Melody [name],  Thank you for coming in today  1. HTN: Elevated BP today Treated with clonidine 0.2 mg x one dose   Refilled Norvasc 10 mg daily  Hydralazine 50 mg three times a day Do take metoprolol but start with 12.5 mg (1/2 tab) twice daily   Come back for medication refills 5-7 days before you run out.   F/u in 2 weeks with RN for BP check  F/u with me in 6 weeks for pap smear  Dr. Adrian Blackwater

## 2015-03-12 NOTE — Progress Notes (Signed)
   Subjective:    Patient ID: Melody [name], female    DOB: 15-Aug-1976, 39 y.o.   MRN: [id number] Cc: f/u HTN  HPI 39 yo F present for f/u appt  1. CHRONIC HYPERTENSION: dx in 2014. Went to ED admitted for HTN urgency with AKI in 12/2014.   Disease Monitoring  Blood pressure range: not checing   Chest pain: no   Dyspnea: no   Claudication: no   Medication compliance: no, ran out 3 days ago was taking  Metoprolol, norvasc and hydralazine  Medication Side Effects  Lightheadedness: no   Urinary frequency: no   Edema: no      Preventitive Healthcare:    Salt Restriction: yes  Soc Hx: former smoker quit in 12/2014, drink occasionally, occasional THC  Fam Hx; mom with HTN, MGM with HTN and died of CHGF and renal failure  Review of Systems  Constitutional: Negative for fever and chills.  Respiratory: Negative for shortness of breath.   Cardiovascular: Negative for chest pain.  Gastrointestinal: Negative for abdominal pain and blood in stool.  Skin: Negative for rash.  Psychiatric/Behavioral: Negative for suicidal ideas and dysphoric mood.      Objective:   Physical Exam BP 190/130 mmHg  Pulse 69  Temp(Src) 99.1 F (37.3 C) (Oral)  Resp 16  Ht 5\' 7"  (1.702 m)  Wt 148 lb (67.132 kg)  BMI 23.17 kg/m2  SpO2 97%  BP Readings from Last 3 Encounters:  03/12/15 190/130  01/26/15 162/98  01/19/15 162/110   Pulse Readings from Last 3 Encounters:  03/12/15 69  01/26/15 92  01/19/15 92   Manual pulse 76 General appearance: alert, cooperative and no distress Head: Normocephalic, without obvious abnormality, atraumatic Eyes: conjunctivae/corneas clear. PERRL, EOM's intact. Fundi benign. Lungs: clear to auscultation bilaterally Heart: regular rate and rhythm, S1, S2 normal, no murmur, click, rub or gallop Extremities: extremities normal, atraumatic, no cyanosis or edema   Manual BP190/130, HR 76  Treated with clonidine 02. Mg PO x one   Repeat BP 150/100      Assessment & Plan:

## 2015-03-12 NOTE — Progress Notes (Signed)
Establish Care with PCP Hx HTN No Hx Tobacco  Stated no BP x 3 days

## 2015-03-14 ENCOUNTER — Telehealth: Payer: Self-pay | Admitting: *Deleted

## 2015-03-14 NOTE — Telephone Encounter (Signed)
-----   Message from Boykin Nearing, MD sent at 03/13/2015 10:11 AM EDT ----- Cr stable at 1.6 on BMP Continue current regimen

## 2015-03-14 NOTE — Telephone Encounter (Signed)
Pt aware of results 

## 2015-04-23 ENCOUNTER — Ambulatory Visit: Payer: Self-pay | Attending: Internal Medicine | Admitting: *Deleted

## 2015-04-23 VITALS — BP 150/108 | HR 81 | Temp 97.9°F | Resp 16 | Ht 66.5 in | Wt 146.8 lb

## 2015-04-23 DIAGNOSIS — Z79899 Other long term (current) drug therapy: Secondary | ICD-10-CM | POA: Insufficient documentation

## 2015-04-23 DIAGNOSIS — I10 Essential (primary) hypertension: Secondary | ICD-10-CM | POA: Insufficient documentation

## 2015-04-23 MED ORDER — SIMVASTATIN 10 MG PO TABS: 10.0000 mg | ORAL_TABLET | Freq: Every day | ORAL | Status: DC

## 2015-04-23 MED ORDER — HYDRALAZINE HCL 100 MG PO TABS: 100.0000 mg | ORAL_TABLET | Freq: Three times a day (TID) | ORAL | Status: DC

## 2015-04-23 NOTE — Patient Instructions (Signed)
Fat and Cholesterol Control Diet Your diet has an affect on your fat and cholesterol levels in your blood and organs. Too much fat and cholesterol in your blood can affect your:  Heart.  Blood vessels (arteries, veins).  Gallbladder.  Liver.  Pancreas. CONTROL FAT AND CHOLESTEROL WITH DIET Certain foods raise cholesterol and others lower it. It is important to replace bad fats with other types of fat.  Do not eat:  Fatty meats, such as hot dogs and salami.  Stick margarine and some tub margarines that have "partially hydrogenated oils" in them.  Baked goods, such as cookies and crackers that have "partially hydrogenated oils" in them.  Saturated tropical oils, such as coconut and palm oil. Eat the following foods:  Round or loin cuts of red meat.  Chicken (without skin).  Fish.  Veal.  Ground [place] breast.  Shellfish.  Fruit, such as apples.  Vegetables, such as broccoli, potatoes, and carrots.  Beans, peas, and lentils (legumes).  Grains, such as barley, rice, couscous, and bulgar wheat.  Pasta (without cream sauces). Look for foods that are nonfat, low in fat, and low in cholesterol.  FIND FOODS THAT ARE LOWER IN FAT AND CHOLESTEROL  Find foods with soluble fiber and plant sterols (phytosterol). You should eat 2 grams a day of these foods. These foods include:  Fruits.  Vegetables.  Whole grains.  Dried beans and peas.  Nuts and seeds.  Read package labels. Look for low-saturated fats, trans fat free, low-fat foods.  Choose cheese that have only 2 to 3 grams of saturated fat per ounce.  Use heart-healthy tub margarine that is free of trans fat or partially hydrogenated oil.  Avoid buying baked goods that have partially hydrogenated oils in them. Instead, buy baked goods made with whole grains (whole-wheat or whole oat flour). Avoid baked goods labeled with "flour" or "enriched flour."  Buy non-creamy canned soups with reduced salt and no added  fats. PREPARING YOUR FOOD  Broil, bake, steam, or roast foods. Do not fry food.  Use non-stick cooking sprays.  Use lemon or herbs to flavor food instead of using butter or stick margarine.  Use nonfat yogurt, salsa, or low-fat dressings for salads. LOW-SATURATED FAT / LOW-FAT FOOD SUBSTITUTES  Meats / Saturated Fat (g)  Avoid: Steak, marbled (3 oz/85 g) / 11 g.  Choose: Steak, lean (3 oz/85 g) / 4 g.  Avoid: Hamburger (3 oz/85 g) / 7 g.  Choose: Hamburger, lean (3 oz/85 g) / 5 g.  Avoid: Ham (3 oz/85 g) / 6 g.  Choose: Ham, lean cut (3 oz/85 g) / 2.4 g.  Avoid: Chicken, with skin, dark meat (3 oz/85 g) / 4 g.  Choose: Chicken, skin removed, dark meat (3 oz/85 g) / 2 g.  Avoid: Chicken, with skin, light meat (3 oz/85 g) / 2.5 g.  Choose: Chicken, skin removed, light meat (3 oz/85 g) / 1 g. Dairy / Saturated Fat (g)  Avoid: Whole milk (1 cup) / 5 g.  Choose: Low-fat milk, 2% (1 cup) / 3 g.  Choose: Low-fat milk, 1% (1 cup) / 1.5 g.  Choose: Skim milk (1 cup) / 0.3 g.  Avoid: Hard cheese (1 oz/28 g) / 6 g.  Choose: Skim milk cheese (1 oz/28 g) / 2 to 3 g.  Avoid: Cottage cheese, 4% fat (1 cup) / 6.5 g.  Choose: Low-fat cottage cheese, 1% fat (1 cup) / 1.5 g.  Avoid: Ice cream (1 cup) / 9 g.  Choose: Sherbet (1 cup) / 2.5 g.  Choose: Nonfat frozen yogurt (1 cup) / 0.3 g.  Choose: Frozen fruit bar / trace.  Avoid: Whipped cream (1 tbs) / 3.5 g.  Choose: Nondairy whipped topping (1 tbs) / 1 g. Condiments / Saturated Fat (g)  Avoid: Mayonnaise (1 tbs) / 2 g.  Choose: Low-fat mayonnaise (1 tbs) / 1 g.  Avoid: Butter (1 tbs) / 7 g.  Choose: Extra light margarine (1 tbs) / 1 g.  Avoid: Coconut oil (1 tbs) / 11.8 g.  Choose: Olive oil (1 tbs) / 1.8 g.  Choose: Corn oil (1 tbs) / 1.7 g.  Choose: Safflower oil (1 tbs) / 1.2 g.  Choose: Sunflower oil (1 tbs) / 1.4 g.  Choose: Soybean oil (1 tbs) / 2.4 g .  Choose: Canola oil (1 tbs) / 1  g. Document Released: 02/10/2012 Document Revised: 04/13/2013 Document Reviewed: 11/10/2013 [facility] Patient Information 2015 [place], [place]. This information is not intended to replace advice given to you by your health care provider. Make sure you discuss any questions you have with your health care provider. DASH Eating Plan DASH stands for "Dietary Approaches to Stop Hypertension." The DASH eating plan is a healthy eating plan that has been shown to reduce high blood pressure (hypertension). Additional health benefits may include reducing the risk of type 2 diabetes mellitus, heart disease, and stroke. The DASH eating plan may also help with weight loss. WHAT DO I NEED TO KNOW ABOUT THE DASH EATING PLAN? For the DASH eating plan, you will follow these general guidelines:  Choose foods with a percent daily value for sodium of less than 5% (as listed on the food label).  Use salt-free seasonings or herbs instead of table salt or sea salt.  Check with your health care provider or pharmacist before using salt substitutes.  Eat lower-sodium products, often labeled as "lower sodium" or "no salt added."  Eat fresh foods.  Eat more vegetables, fruits, and low-fat dairy products.  Choose whole grains. Look for the word "whole" as the first word in the ingredient list.  Choose fish and skinless chicken or [place] more often than red meat. Limit fish, poultry, and meat to 6 oz (170 g) each day.  Limit sweets, desserts, sugars, and sugary drinks.  Choose heart-healthy fats.  Limit cheese to 1 oz (28 g) per day.  Eat more home-cooked food and less restaurant, buffet, and fast food.  Limit fried foods.  Cook foods using methods other than frying.  Limit canned vegetables. If you do use them, rinse them well to decrease the sodium.  When eating at a restaurant, ask that your food be prepared with less salt, or no salt if possible. WHAT FOODS CAN I EAT? Seek help from a dietitian for  individual calorie needs. Grains Whole grain or whole wheat bread. Brown rice. Whole grain or whole wheat pasta. Quinoa, bulgur, and whole grain cereals. Low-sodium cereals. Corn or whole wheat flour tortillas. Whole grain cornbread. Whole grain crackers. Low-sodium crackers. Vegetables Fresh or frozen vegetables (raw, steamed, roasted, or grilled). Low-sodium or reduced-sodium tomato and vegetable juices. Low-sodium or reduced-sodium tomato sauce and paste. Low-sodium or reduced-sodium canned vegetables.  Fruits All fresh, canned (in natural juice), or frozen fruits. Meat and Other Protein Products Ground beef (85% or leaner), grass-fed beef, or beef trimmed of fat. Skinless chicken or [place]. Ground chicken or [place]. Pork trimmed of fat. All fish and seafood. Eggs. Dried beans, peas, or lentils. Unsalted nuts  and seeds. Unsalted canned beans. Dairy Low-fat dairy products, such as skim or 1% milk, 2% or reduced-fat cheeses, low-fat ricotta or cottage cheese, or plain low-fat yogurt. Low-sodium or reduced-sodium cheeses. Fats and Oils Tub margarines without trans fats. Light or reduced-fat mayonnaise and salad dressings (reduced sodium). Avocado. Safflower, olive, or canola oils. Natural peanut or almond butter. Other Unsalted popcorn and pretzels. The items listed above may not be a complete list of recommended foods or beverages. Contact your dietitian for more options. WHAT FOODS ARE NOT RECOMMENDED? Grains White bread. White pasta. White rice. Refined cornbread. Bagels and croissants. Crackers that contain trans fat. Vegetables Creamed or fried vegetables. Vegetables in a cheese sauce. Regular canned vegetables. Regular canned tomato sauce and paste. Regular tomato and vegetable juices. Fruits Dried fruits. Canned fruit in light or heavy syrup. Fruit juice. Meat and Other Protein Products Fatty cuts of meat. Ribs, chicken wings, bacon, sausage, bologna, salami, chitterlings, fatback, hot  dogs, bratwurst, and packaged luncheon meats. Salted nuts and seeds. Canned beans with salt. Dairy Whole or 2% milk, cream, half-and-half, and cream cheese. Whole-fat or sweetened yogurt. Full-fat cheeses or blue cheese. Nondairy creamers and whipped toppings. Processed cheese, cheese spreads, or cheese curds. Condiments Onion and garlic salt, seasoned salt, table salt, and sea salt. Canned and packaged gravies. Worcestershire sauce. Tartar sauce. Barbecue sauce. Teriyaki sauce. Soy sauce, including reduced sodium. Steak sauce. Fish sauce. Oyster sauce. Cocktail sauce. Horseradish. Ketchup and mustard. Meat flavorings and tenderizers. Bouillon cubes. Hot sauce. Tabasco sauce. Marinades. Taco seasonings. Relishes. Fats and Oils Butter, stick margarine, lard, shortening, ghee, and bacon fat. Coconut, palm kernel, or palm oils. Regular salad dressings. Other Pickles and olives. Salted popcorn and pretzels. The items listed above may not be a complete list of foods and beverages to avoid. Contact your dietitian for more information. WHERE CAN I FIND MORE INFORMATION? National Heart, Lung, and Blood Institute: travelstabloid.com Document Released: 07/31/2011 Document Revised: 12/26/2013 Document Reviewed: 06/15/2013 [facility] Patient Information 2015 [place], [place]. This information is not intended to replace advice given to you by your health care provider. Make sure you discuss any questions you have with your health care provider.

## 2015-04-23 NOTE — Progress Notes (Signed)
Patient presents for BP check after starting metoprolol 12.5 mg bid Med list reviewed; states taking BP meds as directed; took simvastatin for 1 month only . Didn't realize needed to continue; will restart today Taking amlodipine 10 mg daily, hydralazine 50 mg tid, and metoprolol 12.5 mg bid. States took each of these meds 4+ hours ago States due for second dose of hydralazine in 1 hour Discussed need for low sodium diet and using Mrs. Dash as alternative to salt Encouraged to choose foods with 5% or less of daily value for sodium. Discussed walking 30 minutes per day for exercise Patient denies headaches, blurred vision, SHOB, chest pain  Quit smoking 3 months ago  Filed Vitals:   04/23/15 1424  BP: 150/108  Pulse: 81  Temp: 97.9 F (36.6 C)  Resp: 16    Per PCP: Increase hydralazine to 100 mg tid. Patient will start this dose when she arrives home.  Patient to f/u with PCP in 2 weeks for BP check and pap  Patient advised to call for med refills at least 7 days before running out so as not to go without.  Patient given literature on DASH Eating Plan and Fat and Cholesterol Control Diet

## 2015-05-20 ENCOUNTER — Emergency Department (HOSPITAL_COMMUNITY)
Admission: EM | Admit: 2015-05-20 | Discharge: 2015-05-21 | Disposition: A | Discharge status: Home / Self Care | Payer: Medicaid Other | Source: Home / Self Care | Attending: Emergency Medicine | Admitting: Emergency Medicine

## 2015-05-20 ENCOUNTER — Encounter (HOSPITAL_COMMUNITY): Payer: Self-pay | Admitting: Emergency Medicine

## 2015-05-20 DIAGNOSIS — I10 Essential (primary) hypertension: Secondary | ICD-10-CM | POA: Insufficient documentation

## 2015-05-20 DIAGNOSIS — Z3202 Encounter for pregnancy test, result negative: Secondary | ICD-10-CM | POA: Insufficient documentation

## 2015-05-20 DIAGNOSIS — Z79899 Other long term (current) drug therapy: Secondary | ICD-10-CM

## 2015-05-20 DIAGNOSIS — R51 Headache: Secondary | ICD-10-CM | POA: Diagnosis not present

## 2015-05-20 DIAGNOSIS — Z87891 Personal history of nicotine dependence: Secondary | ICD-10-CM

## 2015-05-20 LAB — CBC WITH DIFFERENTIAL/PLATELET
Basophils Absolute: 0 10*3/uL (ref 0.0–0.1)
Basophils Relative: 0 %
EOS PCT: 1 %
Eosinophils Absolute: 0.2 10*3/uL (ref 0.0–0.7)
HEMATOCRIT: 41.9 % (ref 36.0–46.0)
Hemoglobin: 14.6 g/dL (ref 12.0–15.0)
LYMPHS PCT: 25 %
Lymphs Abs: 2.9 10*3/uL (ref 0.7–4.0)
MCH: 32.7 pg (ref 26.0–34.0)
MCHC: 34.8 g/dL (ref 30.0–36.0)
MCV: 93.7 fL (ref 78.0–100.0)
MONO ABS: 0.8 10*3/uL (ref 0.1–1.0)
MONOS PCT: 7 %
NEUTROS ABS: 7.8 10*3/uL — AB (ref 1.7–7.7)
Neutrophils Relative %: 67 %
PLATELETS: 242 10*3/uL (ref 150–400)
RBC: 4.47 MIL/uL (ref 3.87–5.11)
RDW: 13.3 % (ref 11.5–15.5)
WBC: 11.7 10*3/uL — ABNORMAL HIGH (ref 4.0–10.5)

## 2015-05-20 LAB — COMPREHENSIVE METABOLIC PANEL
ALT: 14 U/L (ref 14–54)
ANION GAP: 10 (ref 5–15)
AST: 17 U/L (ref 15–41)
Albumin: 4.1 g/dL (ref 3.5–5.0)
Alkaline Phosphatase: 43 U/L (ref 38–126)
BILIRUBIN TOTAL: 0.5 mg/dL (ref 0.3–1.2)
BUN: 14 mg/dL (ref 6–20)
CHLORIDE: 106 mmol/L (ref 101–111)
CO2: 22 mmol/L (ref 22–32)
Calcium: 9.5 mg/dL (ref 8.9–10.3)
Creatinine, Ser: 1.34 mg/dL — ABNORMAL HIGH (ref 0.44–1.00)
GFR, EST AFRICAN AMERICAN: 56 mL/min — AB (ref >60)
GFR, EST NON AFRICAN AMERICAN: 48 mL/min — AB (ref >60)
Glucose, Bld: 99 mg/dL (ref 65–99)
POTASSIUM: 3.2 mmol/L — AB (ref 3.5–5.1)
Sodium: 138 mmol/L (ref 135–145)
TOTAL PROTEIN: 6.9 g/dL (ref 6.5–8.1)

## 2015-05-20 LAB — POC URINE PREG, ED: PREG TEST UR: NEGATIVE

## 2015-05-20 MED ORDER — METOCLOPRAMIDE HCL 10 MG PO TABS
10.0000 mg | ORAL_TABLET | Freq: Once | ORAL | Status: AC
  Administered 2015-05-21: 10 mg via ORAL
  Filled 2015-05-20: qty 1

## 2015-05-20 MED ORDER — AMLODIPINE BESYLATE 5 MG PO TABS
10.0000 mg | ORAL_TABLET | Freq: Once | ORAL | Status: AC
  Administered 2015-05-21: 10 mg via ORAL
  Filled 2015-05-20: qty 2

## 2015-05-20 MED ORDER — SIMVASTATIN 10 MG PO TABS: 10.0000 mg | ORAL_TABLET | Freq: Every day | ORAL | Status: DC

## 2015-05-20 NOTE — ED Notes (Signed)
Pt. reports elevated blood pressure at home this evening ( 221/148) , states headache and neck pain , alert and oriented / no nausea.

## 2015-05-20 NOTE — ED Provider Notes (Signed)
CSN: [id number]     Arrival date & time 05/20/15  2215 History   First MD Initiated Contact with Patient 05/20/15 2313     Chief Complaint  Patient presents with  . Hypertension     (Consider location/radiation/quality/duration/timing/severity/associated sxs/prior Treatment) HPI  Melody [name] is a 39 y.o. female with PMH of poorly uncontrolled HTN, here with HTN.  She states her BP at home was 221/148.  She took her blood pressure because she was experiencing headaches at home.  She denies any neuro symptoms.  She has been hospitalized for this in the past. She denies chest pain, SOB, or confusion.  She has no further complaints.  She has been compliant with her HTN medication which include hydralazine and metoprolol.  She states she was taken off other medications by PCP.  10 Systems reviewed and are negative for acute change except as noted in the HPI.    Past Medical History  Diagnosis Date  . Ectopic pregnancy   . Tobacco abuse   . Alcohol abuse   . Drug abuse   . Hypertension Dx May 2016   Past Surgical History  Procedure Laterality Date  . Dilation and curettage of uterus     Family History  Problem Relation Age of Onset  . CAD Mother   . Hypertension Mother   . Heart disease Mother   . Kidney failure Mother   . CAD Brother   . Hypertension Other   . Kidney failure Other   . Hypertension Maternal Grandmother   . Kidney failure Maternal Grandmother   . Congestive Heart Failure Maternal Grandmother    Social History  Substance Use Topics  . Smoking status: Former Smoker -- 0.00 packs/day for 0 years    Types: Cigars, Cigarettes    Start date: 01/21/2015  . Smokeless tobacco: Former Systems developer  . Alcohol Use: Yes   OB History    No data available     Review of Systems    Allergies  Review of patient's allergies indicates no known allergies.  Home Medications   Prior to Admission medications   Medication Sig Start Date End Date Taking? Authorizing  Provider  amLODipine (NORVASC) 10 MG tablet Take 1 tablet (10 mg total) by mouth daily. 03/12/15   Josalyn Funches, MD  hydrALAZINE (APRESOLINE) 100 MG tablet Take 1 tablet (100 mg total) by mouth 3 (three) times daily. 04/23/15   Boykin Nearing, MD  metoprolol tartrate (LOPRESSOR) 25 MG tablet Take 0.5 tablets (12.5 mg total) by mouth 2 (two) times daily. 03/12/15   Josalyn Funches, MD  simvastatin (ZOCOR) 10 MG tablet Take 1 tablet (10 mg total) by mouth at bedtime. 04/23/15   Lance Bosch, NP   BP 218/128 mmHg  Pulse 64  Temp(Src) 98.1 F (36.7 C) (Oral)  Resp 16  Ht 5\' 6"  (1.676 m)  Wt 150 lb (68.04 kg)  BMI 24.22 kg/m2  SpO2 99%  LMP 05/15/2015 Physical Exam  Constitutional: She is oriented to person, place, and time. She appears well-developed and well-nourished. No distress.  HENT:  Head: Normocephalic and atraumatic.  Nose: Nose normal.  Mouth/Throat: Oropharynx is clear and moist. No oropharyngeal exudate.  Eyes: Conjunctivae and EOM are normal. Pupils are equal, round, and reactive to light. No scleral icterus.  Neck: Normal range of motion. Neck supple. No JVD present. No tracheal deviation present. No thyromegaly present.  Cardiovascular: Normal rate, regular rhythm and normal heart sounds.  Exam reveals no gallop and no friction rub.  No murmur heard. Pulmonary/Chest: Effort normal and breath sounds normal. No respiratory distress. She has no wheezes. She exhibits no tenderness.  Abdominal: Soft. Bowel sounds are normal. She exhibits no distension and no mass. There is no tenderness. There is no rebound and no guarding.  Musculoskeletal: Normal range of motion. She exhibits no edema or tenderness.  Lymphadenopathy:    She has no cervical adenopathy.  Neurological: She is alert and oriented to person, place, and time. No cranial nerve deficit. She exhibits normal muscle tone.  Normal strength and sensation in all extremities, normal cerebellar testing and gait.  Skin:  Skin is warm and dry. No rash noted. No erythema. No pallor.  Nursing note and vitals reviewed.   ED Course  Procedures (including critical care time) Labs Review Labs Reviewed  CBC WITH DIFFERENTIAL/PLATELET - Abnormal; Notable for the following:    WBC 11.7 (*)    Neutro Abs 7.8 (*)    All other components within normal limits  COMPREHENSIVE METABOLIC PANEL - Abnormal; Notable for the following:    Potassium 3.2 (*)    Creatinine, Ser 1.34 (*)    GFR calc non Af Amer 48 (*)    GFR calc Af Amer 56 (*)    All other components within normal limits  POC URINE PREG, ED    Imaging Review No results found. I have personally reviewed and evaluated these images and lab results as part of my medical decision-making.   EKG Interpretation None      MDM   Final diagnoses:  None   Patient presents to the ED for HTN.  I do not believe this is HTN emergency because she does not have evidence of end organ damage.  Will place her back on amlodipine and encourage immediate PCP fu.  Repeat BP 5 min after amlodipine given  was 140/90.  This likely came down on its own.  Will DC patient with amlodipine 5mg  to take daily and see PCP within 3 days for close follow up.  She appears well and in NAD.  Her VS remain within her normal limits and she is safe for DC.    Everlene Balls, MD 05/21/15 [phone]

## 2015-05-21 ENCOUNTER — Encounter (HOSPITAL_COMMUNITY): Payer: Self-pay | Admitting: Emergency Medicine

## 2015-05-21 ENCOUNTER — Emergency Department (HOSPITAL_COMMUNITY)
Admission: EM | Admit: 2015-05-21 | Discharge: 2015-05-21 | Disposition: A | Discharge status: Home / Self Care | Payer: Medicaid Other | Attending: Emergency Medicine | Admitting: Emergency Medicine

## 2015-05-21 DIAGNOSIS — R51 Headache: Secondary | ICD-10-CM | POA: Insufficient documentation

## 2015-05-21 DIAGNOSIS — Z87891 Personal history of nicotine dependence: Secondary | ICD-10-CM | POA: Insufficient documentation

## 2015-05-21 DIAGNOSIS — Z79899 Other long term (current) drug therapy: Secondary | ICD-10-CM | POA: Insufficient documentation

## 2015-05-21 DIAGNOSIS — I10 Essential (primary) hypertension: Secondary | ICD-10-CM | POA: Insufficient documentation

## 2015-05-21 DIAGNOSIS — R519 Headache, unspecified: Secondary | ICD-10-CM

## 2015-05-21 MED ORDER — HYDRALAZINE HCL 50 MG PO TABS
50.0000 mg | ORAL_TABLET | Freq: Once | ORAL | Status: AC
  Administered 2015-05-21: 50 mg via ORAL
  Filled 2015-05-21: qty 1

## 2015-05-21 MED ORDER — KETOROLAC TROMETHAMINE 15 MG/ML IJ SOLN
15.0000 mg | Freq: Once | INTRAMUSCULAR | Status: AC
  Administered 2015-05-21: 15 mg via INTRAVENOUS
  Filled 2015-05-21: qty 1

## 2015-05-21 MED ORDER — DIPHENHYDRAMINE HCL 50 MG/ML IJ SOLN
25.0000 mg | Freq: Once | INTRAMUSCULAR | Status: AC
  Administered 2015-05-21: 25 mg via INTRAVENOUS
  Filled 2015-05-21: qty 1

## 2015-05-21 MED ORDER — SIMVASTATIN 10 MG PO TABS
10.0000 mg | ORAL_TABLET | Freq: Once | ORAL | Status: AC
  Administered 2015-05-21: 10 mg via ORAL
  Filled 2015-05-21: qty 1

## 2015-05-21 MED ORDER — PROCHLORPERAZINE EDISYLATE 5 MG/ML IJ SOLN
10.0000 mg | Freq: Four times a day (QID) | INTRAMUSCULAR | Status: DC | PRN
  Administered 2015-05-21: 10 mg via INTRAVENOUS
  Filled 2015-05-21: qty 2

## 2015-05-21 MED ORDER — METOPROLOL TARTRATE 25 MG PO TABS
25.0000 mg | ORAL_TABLET | Freq: Once | ORAL | Status: AC
  Administered 2015-05-21: 25 mg via ORAL
  Filled 2015-05-21: qty 1

## 2015-05-21 MED ORDER — AMLODIPINE BESYLATE 5 MG PO TABS: 5.0000 mg | ORAL_TABLET | Freq: Every day | ORAL | Status: DC

## 2015-05-21 MED ORDER — SODIUM CHLORIDE 0.9 % IV BOLUS (SEPSIS)
1000.0000 mL | Freq: Once | INTRAVENOUS | Status: AC
  Administered 2015-05-21: 1000 mL via INTRAVENOUS

## 2015-05-21 MED ORDER — AMLODIPINE BESYLATE 10 MG PO TABS: 10.0000 mg | ORAL_TABLET | Freq: Every day | ORAL | Status: DC

## 2015-05-21 NOTE — Discharge Instructions (Signed)
Hypertension Melody [name], take amlodipine daily and see your primary care doctor within 3 days for close follow up of your blood pressure.  If any symptoms worsen, come back to the ED immediately. Thank you. Hypertension is another name for high blood pressure. High blood pressure forces your heart to work harder to pump blood. A blood pressure reading has two numbers, which includes a higher number over a lower number (example: 110/72). HOME CARE   Have your blood pressure rechecked by your doctor.  Only take medicine as told by your doctor. Follow the directions carefully. The medicine does not work as well if you skip doses. Skipping doses also puts you at risk for problems.  Do not smoke.  Monitor your blood pressure at home as told by your doctor. GET HELP IF:  You think you are having a reaction to the medicine you are taking.  You have repeat headaches or feel dizzy.  You have puffiness (swelling) in your ankles.  You have trouble with your vision. GET HELP RIGHT AWAY IF:   You get a very bad headache and are confused.  You feel weak, numb, or faint.  You get chest or belly (abdominal) pain.  You throw up (vomit).  You cannot breathe very well. MAKE SURE YOU:   Understand these instructions.  Will watch your condition.  Will get help right away if you are not doing well or get worse. Document Released: 01/28/2008 Document Revised: 08/16/2013 Document Reviewed: 06/03/2013 [facility] Patient Information 2015 [place], [place]. This information is not intended to replace advice given to you by your health care provider. Make sure you discuss any questions you have with your health care provider.

## 2015-05-21 NOTE — ED Notes (Signed)
Di Kindle, RN, primary nurse, given update on patient.

## 2015-05-21 NOTE — ED Notes (Signed)
Patient ambulated to restroom and back to bed.  Pt tolerated it well.

## 2015-05-21 NOTE — Discharge Instructions (Signed)

## 2015-05-21 NOTE — ED Notes (Signed)
Pt comes in today with a c/o hypertension. Pt states that she has a hx of hypertension. Pt states that she did take her medicine today. Pt states that she was seen yesterday for the same, given a rx that she filled, and even took today. Pt states that she is having a terrible headache today that "came out of nowhere."

## 2015-05-21 NOTE — ED Notes (Signed)
Pt c/o HTN and headache, seen in ED last night for the same, HTN and headache persist. Pt did take prescribed antihypertensives today. BP 193/119.

## 2015-05-21 NOTE — ED Notes (Signed)
Pt states she is suppose to take 100mg  of Hydralazine now. Pt given 50mg  of Hydralazine from home medications by Wilson Singer, MD.

## 2015-05-21 NOTE — ED Notes (Signed)
Kohut, MD at bedside. 

## 2015-06-01 NOTE — ED Provider Notes (Signed)
CSN: [id number]     Arrival date & time 05/21/15  1537 History   First MD Initiated Contact with Patient 05/21/15 1756     Chief Complaint  Patient presents with  . Hypertension     (Consider location/radiation/quality/duration/timing/severity/associated sxs/prior Treatment) HPI    39 year old female with headache. Gradual onset about a day ago. Progressively worsening. Pain is relatively constant. Throbbing in nature. Associated photophobia. No visual complaints otherwise. No fevers or chills. No neck pain or neck stiffness. Has had headaches similar in character previously although current headache is more intense than previous ones. No blood thinners. Patient is concerned about her blood pressure. Is on several different agents. Reports compliance.   Past Medical History  Diagnosis Date  . Ectopic pregnancy   . Tobacco abuse   . Alcohol abuse   . Drug abuse   . Hypertension Dx May 2016   Past Surgical History  Procedure Laterality Date  . Dilation and curettage of uterus     Family History  Problem Relation Age of Onset  . CAD Mother   . Hypertension Mother   . Heart disease Mother   . Kidney failure Mother   . CAD Brother   . Hypertension Other   . Kidney failure Other   . Hypertension Maternal Grandmother   . Kidney failure Maternal Grandmother   . Congestive Heart Failure Maternal Grandmother    Social History  Substance Use Topics  . Smoking status: Former Smoker -- 0.00 packs/day for 0 years    Types: Cigars, Cigarettes    Start date: 01/21/2015  . Smokeless tobacco: Former Systems developer  . Alcohol Use: Yes   OB History    No data available     Review of Systems  All systems reviewed and negative, other than as noted in HPI.   Allergies  Review of patient's allergies indicates no known allergies.  Home Medications   Prior to Admission medications   Medication Sig Start Date End Date Taking? Authorizing Provider  hydrALAZINE (APRESOLINE) 100 MG tablet Take  1 tablet (100 mg total) by mouth 3 (three) times daily. 04/23/15  Yes Josalyn Funches, MD  metoprolol tartrate (LOPRESSOR) 25 MG tablet Take 0.5 tablets (12.5 mg total) by mouth 2 (two) times daily. 03/12/15  Yes Josalyn Funches, MD  simvastatin (ZOCOR) 10 MG tablet Take 1 tablet (10 mg total) by mouth at bedtime. 04/23/15  Yes Lance Bosch, NP  amLODipine (NORVASC) 10 MG tablet Take 1 tablet (10 mg total) by mouth daily. 05/21/15   Virgel Manifold, MD   BP 134/87 mmHg  Pulse 66  Temp(Src) 98 F (36.7 C) (Oral)  Resp 17  SpO2 97%  LMP 05/15/2015 Physical Exam  Constitutional: She is oriented to person, place, and time. She appears well-developed and well-nourished. No distress.  HENT:  Head: Normocephalic and atraumatic.  Eyes: Conjunctivae and EOM are normal. Pupils are equal, round, and reactive to light. Right eye exhibits no discharge. Left eye exhibits no discharge.  Neck: Neck supple.  No nuchal rigidity  Cardiovascular: Normal rate, regular rhythm and normal heart sounds.  Exam reveals no gallop and no friction rub.   No murmur heard. Pulmonary/Chest: Effort normal and breath sounds normal. No respiratory distress.  Abdominal: Soft. She exhibits no distension. There is no tenderness.  Musculoskeletal: She exhibits no edema or tenderness.  Neurological: She is alert and oriented to person, place, and time. No cranial nerve deficit. She exhibits normal muscle tone. Coordination normal.  Speech clear. Content appropriate.  Follows commands. Cranial nerves II-12 are intact. Strength is 5 out of 5 bilateral upper lower extremities. Good finger to nose testing bilaterally. Gait is steady.  Skin: Skin is warm and dry.  Psychiatric: She has a normal mood and affect. Her behavior is normal. Thought content normal.  Nursing note and vitals reviewed.   ED Course  Procedures (including critical care time) Labs Review Labs Reviewed - No data to display  Imaging Review No results found. I  have personally reviewed and evaluated these images and lab results as part of my medical decision-making.   EKG Interpretation None      MDM   Final diagnoses:  Nonintractable headache, unspecified chronicity pattern, unspecified headache type  Essential hypertension    39yF with headache. Suspect primary HA. Consider emergent secondary causes such as bleed, infectious or mass but doubt. There is no history of trauma. Pt has a nonfocal neurological exam. Afebrile and neck supple. No use of blood thinning medication. Consider ocular etiology such as acute angle closure glaucoma but doubt. Pt denies acute change in visual acuity and eye exam unremarkable. Doubt temporal arteritis given age, no temporal tenderness and temporal artery pulsations palpable. Doubt CO poisoning. No contacts with similar symptoms. Doubt venous thrombosis. Doubt carotid or vertebral arteries dissection. Per review of previous records, patient was previously taking 10 mg of Norvasc 5 mg. She was provided with prescription for 10 mg tablets. Advised to take her other hypertensive medicines as prescribed. Symptoms improved with meds. Feel that can be safely discharged, but strict return precautions discussed. Outpt fu.     Virgel Manifold, MD 06/01/15 [phone]

## 2015-06-20 ENCOUNTER — Emergency Department (HOSPITAL_COMMUNITY)
Admission: EM | Admit: 2015-06-20 | Discharge: 2015-06-20 | Disposition: A | Discharge status: Home / Self Care | Payer: Medicaid Other | Attending: Emergency Medicine | Admitting: Emergency Medicine

## 2015-06-20 ENCOUNTER — Encounter (HOSPITAL_COMMUNITY): Payer: Self-pay | Admitting: Emergency Medicine

## 2015-06-20 DIAGNOSIS — H109 Unspecified conjunctivitis: Secondary | ICD-10-CM | POA: Diagnosis not present

## 2015-06-20 DIAGNOSIS — I10 Essential (primary) hypertension: Secondary | ICD-10-CM | POA: Insufficient documentation

## 2015-06-20 DIAGNOSIS — Z87891 Personal history of nicotine dependence: Secondary | ICD-10-CM | POA: Diagnosis not present

## 2015-06-20 DIAGNOSIS — Z79899 Other long term (current) drug therapy: Secondary | ICD-10-CM | POA: Diagnosis not present

## 2015-06-20 DIAGNOSIS — H5711 Ocular pain, right eye: Secondary | ICD-10-CM | POA: Diagnosis present

## 2015-06-20 MED ORDER — POLYMYXIN B-TRIMETHOPRIM 10000-0.1 UNIT/ML-% OP SOLN: 1.0000 [drp] | OPHTHALMIC | Status: DC

## 2015-06-20 MED ORDER — TETRACAINE HCL 0.5 % OP SOLN
2.0000 [drp] | Freq: Once | OPHTHALMIC | Status: AC
  Administered 2015-06-20: 2 [drp] via OPHTHALMIC
  Filled 2015-06-20: qty 2

## 2015-06-20 MED ORDER — FLUORESCEIN SODIUM 1 MG OP STRP
1.0000 | ORAL_STRIP | Freq: Once | OPHTHALMIC | Status: AC
  Administered 2015-06-20: 1 via OPHTHALMIC
  Filled 2015-06-20: qty 1

## 2015-06-20 MED ORDER — POLYMYXIN B-TRIMETHOPRIM 10000-0.1 UNIT/ML-% OP SOLN
1.0000 [drp] | Freq: Once | OPHTHALMIC | Status: AC
  Administered 2015-06-20: 1 [drp] via OPHTHALMIC
  Filled 2015-06-20: qty 10

## 2015-06-20 NOTE — ED Notes (Signed)
Visual test 20/25 with both eyes and single eye 20/30 right and left

## 2015-06-20 NOTE — Discharge Instructions (Signed)
°  Follow with the eye doctor in the next 24 to 28 hours  Do not reuse your contact lenses and do not use any contact lenses until you are cleared by the eye doctor.    Wash your hands frequently and try to keep your hands away from the affected eye(s).   You should be feeling some improvement by 48 hours. If symptoms worsen, you develop pain, change in your vision or no improvement in 48 hours please follow with the ophthalmologist or, if that is not possible, return to the emergency room for a recheck.   Bacterial Conjunctivitis Bacterial conjunctivitis (commonly called pink eye) is redness, soreness, or puffiness (inflammation) of the white part of your eye. It is caused by a germ called bacteria. These germs can easily spread from person to person (contagious). Your eye often will become red or pink. Your eye may also become irritated, watery, or have a thick discharge.  HOME CARE   Apply a cool, clean washcloth over closed eyelids. Do this for 10-20 minutes, 3-4 times a day while you have pain.  Gently wipe away any fluid coming from the eye with a warm, wet washcloth or cotton ball.  Wash your hands often with soap and water. Use paper towels to dry your hands.  Do not share towels or washcloths.  Change or wash your pillowcase every day.  Do not use eye makeup until the infection is gone.  Do not use machines or drive if your vision is blurry.  Stop using contact lenses. Do not use them again until your doctor says it is okay.  Do not touch the tip of the eye drop bottle or medicine tube with your fingers when you put medicine on the eye. GET HELP RIGHT AWAY IF:   Your eye is not better after 3 days of starting your medicine.  You have a yellowish fluid coming out of the eye.  You have more pain in the eye.  Your eye redness is spreading.  Your vision becomes blurry.  You have a fever or lasting symptoms for more than 2-3 days.  You have a fever and your symptoms  suddenly get worse.  You have pain in the face.  Your face gets red or puffy (swollen). MAKE SURE YOU:   Understand these instructions.  Will watch this condition.  Will get help right away if you are not doing well or get worse.   This information is not intended to replace advice given to you by your health care provider. Make sure you discuss any questions you have with your health care provider.   Document Released: 05/20/2008 Document Revised: 07/28/2012 Document Reviewed: 04/16/2012 Elsevier Interactive Patient Education [employer].

## 2015-06-20 NOTE — ED Provider Notes (Signed)
CSN: [id number]     Arrival date & time 06/20/15  1623 History  By signing my name below, I, Rayna Sexton, attest that this documentation has been prepared under the direction and in the presence of [employer], PA-C. Electronically Signed: Rayna Sexton, ED Scribe. 06/20/2015. 5:06 PM.   Chief Complaint  Patient presents with  . Eye Pain   The history is provided by the patient. No language interpreter was used.    HPI Comments: Melody [name] is a 39 y.o. female who presents to the Emergency Department complaining of mild right eye irritation with onset 1 day ago. 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 She denies wearing contact lenses. Pt denies any known drug allergies. Pt denies any rhinorrhea, fever or other cold-like symptoms.   Past Medical History  Diagnosis Date  . Ectopic pregnancy   . Tobacco abuse   . Alcohol abuse   . Drug abuse   . Hypertension Dx May 2016   Past Surgical History  Procedure Laterality Date  . Dilation and curettage of uterus     Family History  Problem Relation Age of Onset  . CAD Mother   . Hypertension Mother   . Heart disease Mother   . Kidney failure Mother   . CAD Brother   . Hypertension Other   . Kidney failure Other   . Hypertension Maternal Grandmother   . Kidney failure Maternal Grandmother   . Congestive Heart Failure Maternal Grandmother    Social History  Substance Use Topics  . Smoking status: Former Smoker -- 0.00 packs/day for 0 years    Types: Cigars, Cigarettes    Start date: 01/21/2015  . Smokeless tobacco: Former Systems developer  . Alcohol Use: Yes   OB History    No data available     Review of Systems A complete 10 system review of systems was obtained and all systems are negative except as noted in the HPI and PMH.   Allergies  Review of patient's allergies indicates no known allergies.  Home  Medications   Prior to Admission medications   Medication Sig Start Date End Date Taking? Authorizing Provider  amLODipine (NORVASC) 10 MG tablet Take 1 tablet (10 mg total) by mouth daily. 05/21/15   Virgel Manifold, MD  hydrALAZINE (APRESOLINE) 100 MG tablet Take 1 tablet (100 mg total) by mouth 3 (three) times daily. 04/23/15   Boykin Nearing, MD  metoprolol tartrate (LOPRESSOR) 25 MG tablet Take 0.5 tablets (12.5 mg total) by mouth 2 (two) times daily. 03/12/15   Josalyn Funches, MD  simvastatin (ZOCOR) 10 MG tablet Take 1 tablet (10 mg total) by mouth at bedtime. 04/23/15   Lance Bosch, NP   BP 174/116 mmHg  Pulse 90  Temp(Src) 98.6 F (37 C) (Oral)  Resp 12  SpO2 100% Physical Exam  Constitutional: She is oriented to person, place, and time. She appears well-developed and well-nourished.  HENT:  Head: Normocephalic and atraumatic.  Mouth/Throat: No oropharyngeal exudate.  Eyes:  Trace injection to right eye, no overt discharge, extraocular movement is intact without pain or diplopia, pupils equal round reactive to light. No abnormal uptake on fluorescein stain.  Neck: Normal range of motion. No tracheal deviation present.  Cardiovascular: Normal rate.   Pulmonary/Chest: Effort normal. No respiratory distress.  Abdominal: Soft. There is no tenderness.  Musculoskeletal: Normal range of motion.  Neurological: She is  alert and oriented to person, place, and time.  Skin: Skin is warm and dry. She is not diaphoretic.  Psychiatric: She has a normal mood and affect. Her behavior is normal.  Nursing note and vitals reviewed.  ED Course  Procedures  DIAGNOSTIC STUDIES: Oxygen Saturation is 100% on RA, normal by my interpretation.    COORDINATION OF CARE: 5:03 PM Pt presents today due to right eye irritation. Discussed next steps with pt at bedside. Pt agreed to plan.  Labs Review Labs Reviewed - No data to display  Imaging Review No results found.   EKG Interpretation None       MDM   Final diagnoses:  None    Filed Vitals:   06/20/15 1632 06/20/15 1736  BP: 174/116 166/111  Pulse: 90 81  Temp: 98.6 F (37 C) 98.6 F (37 C)  TempSrc: Oral Oral  Resp: 12 12  SpO2: 100% 100%    Medications  trimethoprim-polymyxin b (POLYTRIM) ophthalmic solution 1 drop (not administered)  fluorescein ophthalmic strip 1 strip (1 strip Both Eyes Given 06/20/15 1735)  tetracaine (PONTOCAINE) 0.5 % ophthalmic solution 2 drop (2 drops Both Eyes Given 06/20/15 1735)    Melody [name] is 39 y.o. female presenting with eye irritation. Normal visual acuity, no abnormal uptake on fluorescein stain. Patient is not a contact lens wearer.  Blood pressure is elevated, states she's due for her p.m. doses. No signs of endorgan damage. We'll let her go home and take her medications there.  Evaluation does not show pathology that would require ongoing emergent intervention or inpatient treatment. Pt is hemodynamically stable and mentating appropriately. Discussed findings and plan with patient/guardian, who agrees with care plan. All questions answered. Return precautions discussed and outpatient follow up given.   New Prescriptions   TRIMETHOPRIM-POLYMYXIN B (POLYTRIM) OPHTHALMIC SOLUTION    Place 1 drop into the right eye every 4 (four) hours.     I personally performed the services described in this documentation, which was scribed in my presence. The recorded information has been reviewed and is accurate.   Monico Blitz, PA-C 06/21/15 Ripon Liu, MD 06/21/15 [phone]

## 2015-06-20 NOTE — ED Notes (Signed)
Went to work yesterday, came home and noticed right eye felt a little irritated. Went to sleep, this morning woke up with clear drainage from eye, itching, more irritated. Sclera white/some yellowish patches with reddened conjunctiva to right eye

## 2015-10-12 ENCOUNTER — Encounter: Payer: Self-pay | Admitting: Family Medicine

## 2015-10-12 ENCOUNTER — Ambulatory Visit: Payer: Self-pay | Attending: Family Medicine | Admitting: Family Medicine

## 2015-10-12 VITALS — BP 173/110 | HR 88 | Temp 98.5°F | Resp 16 | Ht 66.0 in | Wt 154.0 lb

## 2015-10-12 DIAGNOSIS — A499 Bacterial infection, unspecified: Secondary | ICD-10-CM

## 2015-10-12 DIAGNOSIS — B9689 Other specified bacterial agents as the cause of diseases classified elsewhere: Secondary | ICD-10-CM

## 2015-10-12 DIAGNOSIS — R1900 Intra-abdominal and pelvic swelling, mass and lump, unspecified site: Secondary | ICD-10-CM | POA: Insufficient documentation

## 2015-10-12 DIAGNOSIS — N760 Acute vaginitis: Secondary | ICD-10-CM

## 2015-10-12 DIAGNOSIS — Z124 Encounter for screening for malignant neoplasm of cervix: Secondary | ICD-10-CM

## 2015-10-12 DIAGNOSIS — I10 Essential (primary) hypertension: Secondary | ICD-10-CM

## 2015-10-12 MED ORDER — SPIRONOLACTONE 25 MG PO TABS: 25.0000 mg | ORAL_TABLET | Freq: Every day | ORAL | Status: DC

## 2015-10-12 MED ORDER — AMLODIPINE BESYLATE 10 MG PO TABS: 10.0000 mg | ORAL_TABLET | Freq: Every day | ORAL | Status: DC

## 2015-10-12 MED ORDER — CARVEDILOL 12.5 MG PO TABS: 12.5000 mg | ORAL_TABLET | Freq: Two times a day (BID) | ORAL | Status: DC

## 2015-10-12 MED ORDER — HYDRALAZINE HCL 100 MG PO TABS: 100.0000 mg | ORAL_TABLET | Freq: Three times a day (TID) | ORAL | Status: DC

## 2015-10-12 NOTE — Progress Notes (Signed)
Pap smear  Vaginal discharge white color without odor No pain today  Elevated BP. Took medication at 930 and 1230 Tobacco user 4 cigarette per day  No suicidal thoughts in the past two weeks

## 2015-10-12 NOTE — Assessment & Plan Note (Signed)
A; HTN uncontrolled Med: compliant P: Change toprol to coreg 12.5 mg BID Add aldactone 25 mg daily Continue hydralazine 100 mg BID Continue norvasc 10 mg daily Smoking cessation

## 2015-10-12 NOTE — Patient Instructions (Addendum)
Melody [name] was seen today for gynecologic exam.  Diagnoses and all orders for this visit:  Pap smear for cervical cancer screening -     Cytology - PAP [place]  Accelerated hypertension -     carvedilol (COREG) 12.5 MG tablet; Take 1 tablet (12.5 mg total) by mouth 2 (two) times daily with a meal. -     amLODipine (NORVASC) 10 MG tablet; Take 1 tablet (10 mg total) by mouth daily. -     hydrALAZINE (APRESOLINE) 100 MG tablet; Take 1 tablet (100 mg total) by mouth 3 (three) times daily. -     spironolactone (ALDACTONE) 25 MG tablet; Take 1 tablet (25 mg total) by mouth daily. -     COMPLETE METABOLIC PANEL WITH GFR  Pelvic fullness in female -     US Transvaginal Non-OB; Future   You will be called with pap results and ultrasound appointment   F/u in 2 weeks for HTN, BP check with me   Dr. Adrian Blackwater

## 2015-10-12 NOTE — Assessment & Plan Note (Signed)
Suspect fibroid Pelvic US

## 2015-10-12 NOTE — Progress Notes (Signed)
SUBJECTIVE:  40 y.o. female for annual routine Pap and HTN   1. Pap:   2. CHRONIC HYPERTENSION  Disease Monitoring  Blood pressure range: not checking   Chest pain: no   Dyspnea: no   Claudication: no   Medication compliance: yes  Medication Side Effects  Lightheadedness: no   Urinary frequency: no   Edema: no   Impotence:    Preventitive Healthcare:  Exercise: no   Diet Pattern:   Salt Restriction: yes  2. Pap: due for screening pap. Has regular periods. Menses are heavy at times. No pain with sex. No intermenstrual bleeding.   Social History  Substance Use Topics  . Smoking status: Current Every Day Smoker -- 0.00 packs/day for 0 years    Types: Cigars, Cigarettes    Start date: 01/21/2015  . Smokeless tobacco: Former Systems developer  . Alcohol Use: 0.0 oz/week    0 Standard drinks or equivalent per week   Current Outpatient Prescriptions  Medication Sig Dispense Refill  . amLODipine (NORVASC) 10 MG tablet Take 1 tablet (10 mg total) by mouth daily. 30 tablet 2  . hydrALAZINE (APRESOLINE) 100 MG tablet Take 1 tablet (100 mg total) by mouth 3 (three) times daily. 90 tablet 2  . metoprolol tartrate (LOPRESSOR) 25 MG tablet Take 0.5 tablets (12.5 mg total) by mouth 2 (two) times daily. 60 tablet 3  . simvastatin (ZOCOR) 10 MG tablet Take 1 tablet (10 mg total) by mouth at bedtime. 30 tablet 2  . trimethoprim-polymyxin b (POLYTRIM) ophthalmic solution Place 1 drop into the right eye every 4 (four) hours. 10 mL 0   No current facility-administered medications for this visit.   Allergies: Review of patient's allergies indicates no known allergies.  No LMP recorded.  ROS:  Feeling well. No dyspnea or chest pain on exertion.  No abdominal pain, change in bowel habits, black or bloody stools.  No urinary tract symptoms. GYN ROS: normal menses, no abnormal bleeding, pelvic pain or discharge. No neurological complaints.  OBJECTIVE:  The patient appears well, alert, oriented x 3, in  no distress. BP 173/110 mmHg  Pulse 88  Temp(Src) 98.5 F (36.9 C) (Oral)  Resp 16  Ht 5\' 6"  (1.676 m)  Wt 154 lb (69.854 kg)  BMI 24.87 kg/m2  SpO2 99%  LMP 09/29/2015 ENT normal.  Neck supple. No adenopathy or thyromegaly. PERLA. Lungs are clear, good air entry, no wheezes, rhonchi or rales. S1 and S2 normal, no murmurs, regular rate and rhythm. Abdomen soft without tenderness, guarding, mass or organomegaly. Extremities show no edema, normal peripheral pulses. Neurological is normal, no focal findings.  BREAST EXAM: not examined  PELVIC EXAM: normal external genitalia, vulva, vagina, cervix, uterus and adnexa  ASSESSMENT:  well woman  PLAN:  pap smear

## 2015-10-13 LAB — COMPLETE METABOLIC PANEL WITH GFR
ALBUMIN: 4.3 g/dL (ref 3.6–5.1)
ALK PHOS: 46 U/L (ref 33–115)
ALT: 13 U/L (ref 6–29)
AST: 15 U/L (ref 10–30)
BUN: 12 mg/dL (ref 7–25)
CHLORIDE: 106 mmol/L (ref 98–110)
CO2: 27 mmol/L (ref 20–31)
Calcium: 9.4 mg/dL (ref 8.6–10.2)
Creat: 1.15 mg/dL — ABNORMAL HIGH (ref 0.50–1.10)
GFR, EST NON AFRICAN AMERICAN: 59 mL/min — AB (ref >=60)
GFR, Est African American: 68 mL/min (ref >=60)
GLUCOSE: 84 mg/dL (ref 65–99)
POTASSIUM: 3.8 mmol/L (ref 3.5–5.3)
SODIUM: 141 mmol/L (ref 135–146)
Total Bilirubin: 0.4 mg/dL (ref 0.2–1.2)
Total Protein: 6.8 g/dL (ref 6.1–8.1)

## 2015-10-16 LAB — CERVICOVAGINAL ANCILLARY ONLY
Chlamydia: NEGATIVE
Neisseria Gonorrhea: NEGATIVE
Wet Prep (BD Affirm): POSITIVE — AB

## 2015-10-16 LAB — CYTOLOGY - PAP

## 2015-10-16 MED ORDER — FLUCONAZOLE 150 MG PO TABS: 150.0000 mg | ORAL_TABLET | Freq: Once | ORAL | Status: DC

## 2015-10-16 MED ORDER — METRONIDAZOLE 500 MG PO TABS: 500.0000 mg | ORAL_TABLET | Freq: Two times a day (BID) | ORAL | Status: AC

## 2015-10-16 NOTE — Addendum Note (Signed)
Addended by: Boykin Nearing on: 10/16/2015 08:23 AM   Modules accepted: Orders, SmartSet

## 2015-10-16 NOTE — Addendum Note (Signed)
Addended by: Boykin Nearing on: 10/16/2015 12:31 PM   Modules accepted: Orders, SmartSet

## 2015-10-18 ENCOUNTER — Telehealth: Payer: Self-pay | Admitting: *Deleted

## 2015-10-18 NOTE — Telephone Encounter (Signed)
-----   Message from Boykin Nearing, MD sent at 10/14/2015 10:08 AM EST ----- CMP normal with just slightly elevated Cr, improved from last check 4 months ago

## 2015-10-18 NOTE — Telephone Encounter (Signed)
-----   Message from Boykin Nearing, MD sent at 10/16/2015  8:22 AM EST ----- BV positive on wet prep Sent in flagyl  Followed by diflucan

## 2015-10-18 NOTE — Telephone Encounter (Signed)
-----   Message from Boykin Nearing, MD sent at 10/17/2015  5:38 PM EST ----- Negative pap, HPV negative, repeat in 5 years

## 2015-10-18 NOTE — Telephone Encounter (Signed)
LVM To return call

## 2015-10-18 NOTE — Telephone Encounter (Signed)
-----   Message from Boykin Nearing, MD sent at 10/16/2015  9:44 AM EST ----- Neg GC/chlam

## 2015-10-23 ENCOUNTER — Ambulatory Visit (HOSPITAL_COMMUNITY)
Admission: RE | Admit: 2015-10-23 | Discharge: 2015-10-23 | Disposition: A | Discharge status: Home / Self Care | Payer: Medicaid Other | Source: Ambulatory Visit | Attending: Family Medicine | Admitting: Family Medicine

## 2015-10-23 DIAGNOSIS — N920 Excessive and frequent menstruation with regular cycle: Secondary | ICD-10-CM | POA: Diagnosis not present

## 2015-10-23 DIAGNOSIS — N852 Hypertrophy of uterus: Secondary | ICD-10-CM | POA: Insufficient documentation

## 2015-10-23 DIAGNOSIS — D259 Leiomyoma of uterus, unspecified: Secondary | ICD-10-CM | POA: Diagnosis not present

## 2015-10-23 DIAGNOSIS — R1900 Intra-abdominal and pelvic swelling, mass and lump, unspecified site: Secondary | ICD-10-CM

## 2015-10-26 ENCOUNTER — Telehealth: Payer: Self-pay | Admitting: *Deleted

## 2015-10-26 NOTE — Telephone Encounter (Signed)
Unable to contact Pt  "Voice mail is full"

## 2015-10-26 NOTE — Telephone Encounter (Signed)
-----   Message from Boykin Nearing, MD sent at 10/23/2015  4:46 PM EST ----- Pelvic and TVUS confirm multiple uterine fibroids  If patient is interested, I will refer her to gyn Fibroids do not need to be treated but are treated if bleeding is prolonged, heavy or painful and it bothers patient's quality of life

## 2016-03-09 ENCOUNTER — Encounter (HOSPITAL_COMMUNITY): Payer: Self-pay | Admitting: Emergency Medicine

## 2016-03-09 ENCOUNTER — Other Ambulatory Visit: Payer: Self-pay

## 2016-03-09 ENCOUNTER — Emergency Department (HOSPITAL_COMMUNITY)
Admission: EM | Admit: 2016-03-09 | Discharge: 2016-03-09 | Disposition: A | Discharge status: Home / Self Care | Payer: Medicaid Other | Attending: Emergency Medicine | Admitting: Emergency Medicine

## 2016-03-09 DIAGNOSIS — I10 Essential (primary) hypertension: Secondary | ICD-10-CM | POA: Diagnosis present

## 2016-03-09 DIAGNOSIS — F17210 Nicotine dependence, cigarettes, uncomplicated: Secondary | ICD-10-CM | POA: Diagnosis not present

## 2016-03-09 DIAGNOSIS — Z9119 Patient's noncompliance with other medical treatment and regimen: Secondary | ICD-10-CM | POA: Diagnosis not present

## 2016-03-09 DIAGNOSIS — Z9114 Patient's other noncompliance with medication regimen: Secondary | ICD-10-CM

## 2016-03-09 DIAGNOSIS — Z79899 Other long term (current) drug therapy: Secondary | ICD-10-CM | POA: Diagnosis not present

## 2016-03-09 LAB — I-STAT CHEM 8, ED
BUN: 12 mg/dL (ref 6–20)
Calcium, Ion: 1.07 mmol/L — ABNORMAL LOW (ref 1.13–1.30)
Chloride: 102 mmol/L (ref 101–111)
Creatinine, Ser: 1.3 mg/dL — ABNORMAL HIGH (ref 0.44–1.00)
GLUCOSE: 70 mg/dL (ref 65–99)
HEMATOCRIT: 41 % (ref 36.0–46.0)
HEMOGLOBIN: 13.9 g/dL (ref 12.0–15.0)
POTASSIUM: 3.2 mmol/L — AB (ref 3.5–5.1)
Sodium: 137 mmol/L (ref 135–145)
TCO2: 21 mmol/L (ref 0–100)

## 2016-03-09 LAB — I-STAT TROPONIN, ED: TROPONIN I, POC: 0.02 ng/mL (ref 0.00–0.08)

## 2016-03-09 MED ORDER — HYDRALAZINE HCL 10 MG PO TABS
10.0000 mg | ORAL_TABLET | Freq: Once | ORAL | Status: DC
Start: 2016-03-09 — End: 2016-03-09
  Filled 2016-03-09: qty 1

## 2016-03-09 MED ORDER — SPIRONOLACTONE 25 MG PO TABS: 25.0000 mg | ORAL_TABLET | Freq: Every day | ORAL | Status: DC

## 2016-03-09 MED ORDER — CARVEDILOL 12.5 MG PO TABS
12.5000 mg | ORAL_TABLET | Freq: Two times a day (BID) | ORAL | Status: DC
  Administered 2016-03-09: 12.5 mg via ORAL
  Filled 2016-03-09: qty 1

## 2016-03-09 MED ORDER — CARVEDILOL 12.5 MG PO TABS: 12.5000 mg | ORAL_TABLET | Freq: Two times a day (BID) | ORAL | Status: DC

## 2016-03-09 MED ORDER — HYDRALAZINE HCL 100 MG PO TABS: 100.0000 mg | ORAL_TABLET | Freq: Three times a day (TID) | ORAL | Status: DC

## 2016-03-09 MED ORDER — HYDRALAZINE HCL 20 MG/ML IJ SOLN
10.0000 mg | Freq: Once | INTRAMUSCULAR | Status: AC
  Administered 2016-03-09: 19:00:00 via INTRAVENOUS
  Filled 2016-03-09: qty 1

## 2016-03-09 MED ORDER — HYDRALAZINE HCL 20 MG/ML IJ SOLN
10.0000 mg | Freq: Once | INTRAMUSCULAR | Status: AC
  Administered 2016-03-09: 10 mg via INTRAVENOUS

## 2016-03-09 MED ORDER — CLONIDINE HCL 0.2 MG PO TABS: 0.3000 mg | ORAL_TABLET | Freq: Once | ORAL | Status: DC

## 2016-03-09 MED ORDER — AMLODIPINE BESYLATE 5 MG PO TABS
10.0000 mg | ORAL_TABLET | Freq: Every day | ORAL | Status: DC
  Administered 2016-03-09: 10 mg via ORAL
  Filled 2016-03-09: qty 2

## 2016-03-09 MED ORDER — AMLODIPINE BESYLATE 10 MG PO TABS: 10.0000 mg | ORAL_TABLET | Freq: Every day | ORAL | Status: DC

## 2016-03-09 NOTE — Discharge Instructions (Signed)
DASH Eating Plan °DASH stands for "Dietary Approaches to Stop Hypertension." The DASH eating plan is a healthy eating plan that has been shown to reduce high blood pressure (hypertension). Additional health benefits may include reducing the risk of type 2 diabetes mellitus, heart disease, and stroke. The DASH eating plan may also help with weight loss. °WHAT DO I NEED TO KNOW ABOUT THE DASH EATING PLAN? °For the DASH eating plan, you will follow these general guidelines: °· Choose foods with a percent daily value for sodium of less than 5% (as listed on the food label). °· Use salt-free seasonings or herbs instead of table salt or sea salt. °· Check with your health care provider or pharmacist before using salt substitutes. °· Eat lower-sodium products, often labeled as "lower sodium" or "no salt added." °· Eat fresh foods. °· Eat more vegetables, fruits, and low-fat dairy products. °· Choose whole grains. Look for the word "whole" as the first word in the ingredient list. °· Choose fish and skinless chicken or turkey more often than red meat. Limit fish, poultry, and meat to 6 oz (170 g) each day. °· Limit sweets, desserts, sugars, and sugary drinks. °· Choose heart-healthy fats. °· Limit cheese to 1 oz (28 g) per day. °· Eat more home-cooked food and less restaurant, buffet, and fast food. °· Limit fried foods. °· Cook foods using methods other than frying. °· Limit canned vegetables. If you do use them, rinse them well to decrease the sodium. °· When eating at a restaurant, ask that your food be prepared with less salt, or no salt if possible. °WHAT FOODS CAN I EAT? °Seek help from a dietitian for individual calorie needs. °Grains °Whole grain or whole wheat bread. Brown rice. Whole grain or whole wheat pasta. Quinoa, bulgur, and whole grain cereals. Low-sodium cereals. Corn or whole wheat flour tortillas. Whole grain cornbread. Whole grain crackers. Low-sodium crackers. °Vegetables °Fresh or frozen vegetables  (raw, steamed, roasted, or grilled). Low-sodium or reduced-sodium tomato and vegetable juices. Low-sodium or reduced-sodium tomato sauce and paste. Low-sodium or reduced-sodium canned vegetables.  °Fruits °All fresh, canned (in natural juice), or frozen fruits. °Meat and Other Protein Products °Ground beef (85% or leaner), grass-fed beef, or beef trimmed of fat. Skinless chicken or turkey. Ground chicken or turkey. Pork trimmed of fat. All fish and seafood. Eggs. Dried beans, peas, or lentils. Unsalted nuts and seeds. Unsalted canned beans. °Dairy °Low-fat dairy products, such as skim or 1% milk, 2% or reduced-fat cheeses, low-fat ricotta or cottage cheese, or plain low-fat yogurt. Low-sodium or reduced-sodium cheeses. °Fats and Oils °Tub margarines without trans fats. Light or reduced-fat mayonnaise and salad dressings (reduced sodium). Avocado. Safflower, olive, or canola oils. Natural peanut or almond butter. °Other °Unsalted popcorn and pretzels. °The items listed above may not be a complete list of recommended foods or beverages. Contact your dietitian for more options. °WHAT FOODS ARE NOT RECOMMENDED? °Grains °White bread. White pasta. White rice. Refined cornbread. Bagels and croissants. Crackers that contain trans fat. °Vegetables °Creamed or fried vegetables. Vegetables in a cheese sauce. Regular canned vegetables. Regular canned tomato sauce and paste. Regular tomato and vegetable juices. °Fruits °Dried fruits. Canned fruit in light or heavy syrup. Fruit juice. °Meat and Other Protein Products °Fatty cuts of meat. Ribs, chicken wings, bacon, sausage, bologna, salami, chitterlings, fatback, hot dogs, bratwurst, and packaged luncheon meats. Salted nuts and seeds. Canned beans with salt. °Dairy °Whole or 2% milk, cream, half-and-half, and cream cheese. Whole-fat or sweetened yogurt. Full-fat   cheeses or blue cheese. Nondairy creamers and whipped toppings. Processed cheese, cheese spreads, or cheese  curds. Condiments Onion and garlic salt, seasoned salt, table salt, and sea salt. Canned and packaged gravies. Worcestershire sauce. Tartar sauce. Barbecue sauce. Teriyaki sauce. Soy sauce, including reduced sodium. Steak sauce. Fish sauce. Oyster sauce. Cocktail sauce. Horseradish. Ketchup and mustard. Meat flavorings and tenderizers. Bouillon cubes. Hot sauce. Tabasco sauce. Marinades. Taco seasonings. Relishes. Fats and Oils Butter, stick margarine, lard, shortening, ghee, and bacon fat. Coconut, palm kernel, or palm oils. Regular salad dressings. Other Pickles and olives. Salted popcorn and pretzels. The items listed above may not be a complete list of foods and beverages to avoid. Contact your dietitian for more information. WHERE CAN I FIND MORE INFORMATION? National Heart, Lung, and Blood Institute: travelstabloid.com   This information is not intended to replace advice given to you by your health care provider. Make sure you discuss any questions you have with your health care provider.   Document Released: 07/31/2011 Document Revised: 09/01/2014 Document Reviewed: 06/15/2013 Elsevier Interactive Patient Education 2016 [place] Your High Blood Pressure Blood pressure is a measurement of how forceful your blood is pressing against the walls of the arteries. Arteries are muscular tubes within the circulatory system. Blood pressure does not stay the same. Blood pressure rises when you are active, excited, or nervous; and it lowers during sleep and relaxation. If the numbers measuring your blood pressure stay above normal most of the time, you are at risk for health problems. High blood pressure (hypertension) is a long-term (chronic) condition in which blood pressure is elevated. A blood pressure reading is recorded as two numbers, such as 120 over 80 (or 120/80). The first, higher number is called the systolic pressure. It is a measure of the  pressure in your arteries as the heart beats. The second, lower number is called the diastolic pressure. It is a measure of the pressure in your arteries as the heart relaxes between beats.  Keeping your blood pressure in a normal range is important to your overall health and prevention of health problems, such as heart disease and stroke. When your blood pressure is uncontrolled, your heart has to work harder than normal. High blood pressure is a very common condition in adults because blood pressure tends to rise with age. Men and women are equally likely to have hypertension but at different times in life. Before age 26, men are more likely to have hypertension. After 42 years of age, women are more likely to have it. Hypertension is especially common in African Americans. This condition often has no signs or symptoms. The cause of the condition is usually not known. Your caregiver can help you come up with a plan to keep your blood pressure in a normal, healthy range. BLOOD PRESSURE STAGES Blood pressure is classified into four stages: normal, prehypertension, stage 1, and stage 2. Your blood pressure reading will be used to determine what type of treatment, if any, is necessary. Appropriate treatment options are tied to these four stages:  Normal  Systolic pressure (mm Hg): below 120.  Diastolic pressure (mm Hg): below 80. Prehypertension  Systolic pressure (mm Hg): 120 to 139.  Diastolic pressure (mm Hg): 80 to 89. Stage1  Systolic pressure (mm Hg): 140 to 159.  Diastolic pressure (mm Hg): 90 to 99. Stage2  Systolic pressure (mm Hg): 160 or above.  Diastolic pressure (mm Hg): 100 or above. RISKS RELATED TO HIGH BLOOD PRESSURE Managing your blood  pressure is an important responsibility. Uncontrolled high blood pressure can lead to:  A heart attack.  A stroke.  A weakened blood vessel (aneurysm).  Heart failure.  Kidney damage.  Eye damage.  Metabolic syndrome.  Memory  and concentration problems. HOW TO MANAGE YOUR BLOOD PRESSURE Blood pressure can be managed effectively with lifestyle changes and medicines (if needed). Your caregiver will help you come up with a plan to bring your blood pressure within a normal range. Your plan should include the following: Education  Read all information provided by your caregivers about how to control blood pressure.  Educate yourself on the latest guidelines and treatment recommendations. New research is always being done to further define the risks and treatments for high blood pressure. Lifestylechanges  Control your weight.  Avoid smoking.  Stay physically active.  Reduce the amount of salt in your diet.  Reduce stress.  Control any chronic conditions, such as high cholesterol or diabetes.  Reduce your alcohol intake. Medicines  Several medicines (antihypertensive medicines) are available, if needed, to bring blood pressure within a normal range. Communication  Review all the medicines you take with your caregiver because there may be side effects or interactions.  Talk with your caregiver about your diet, exercise habits, and other lifestyle factors that may be contributing to high blood pressure.  See your caregiver regularly. Your caregiver can help you create and adjust your plan for managing high blood pressure. RECOMMENDATIONS FOR TREATMENT AND FOLLOW-UP  The following recommendations are based on current guidelines for managing high blood pressure in nonpregnant adults. Use these recommendations to identify the proper follow-up period or treatment option based on your blood pressure reading. You can discuss these options with your caregiver.  Systolic pressure of 123456 to [id number] or diastolic pressure of 80 to 89: Follow up with your caregiver as directed.  Systolic pressure of [id number] to 0000000 or diastolic pressure of 90 to 100: Follow up with your caregiver within 2 months.  Systolic pressure above 0000000  or diastolic pressure above 123XX123: Follow up with your caregiver within 1 month.  Systolic pressure above [id number] or diastolic pressure above [id number]: Consider antihypertensive therapy; follow up with your caregiver within 1 week.  Systolic pressure above [id number] or diastolic pressure above 123456: Begin antihypertensive therapy; follow up with your caregiver within 1 week.   This information is not intended to replace advice given to you by your health care provider. Make sure you discuss any questions you have with your health care provider.   Document Released: 05/05/2012 Document Reviewed: 05/05/2012 Elsevier Interactive Patient Education 2016 [employer].  Hypertension Hypertension is another name for high blood pressure. High blood pressure forces your heart to work harder to pump blood. A blood pressure reading has two numbers, which includes a higher number over a lower number (example: 110/72). HOME CARE   Have your blood pressure rechecked by your doctor.  Only take medicine as told by your doctor. Follow the directions carefully. The medicine does not work as well if you skip doses. Skipping doses also puts you at risk for problems.  Do not smoke.  Monitor your blood pressure at home as told by your doctor. GET HELP IF:  You think you are having a reaction to the medicine you are taking.  You have repeat headaches or feel dizzy.  You have puffiness (swelling) in your ankles.  You have trouble with your vision. GET HELP RIGHT AWAY IF:   You get a very bad headache  and are confused.  You feel weak, numb, or faint.  You get chest or belly (abdominal) pain.  You throw up (vomit).  You cannot breathe very well. MAKE SURE YOU:   Understand these instructions.  Will watch your condition.  Will get help right away if you are not doing well or get worse.   This information is not intended to replace advice given to you by your health care provider. Make sure you discuss any  questions you have with your health care provider.   Document Released: 01/28/2008 Document Revised: 08/16/2013 Document Reviewed: 06/03/2013 Elsevier Interactive Patient Education [employer].

## 2016-03-09 NOTE — ED Notes (Addendum)
C/o hypertension and headache x 2 days.  States she has been out of blood pressure medication for the last 2 days.

## 2016-03-09 NOTE — ED Provider Notes (Signed)
CSN: [id number]     Arrival date & time 03/09/16  1717 History   First MD Initiated Contact with Patient 03/09/16 1801     Chief Complaint  Patient presents with  . Hypertension      HPI  Expand All Collapse All   C/o hypertension and headache x 2 days. States she has been out of blood pressure medication for the last 2 days.        Past Medical History  Diagnosis Date  . Ectopic pregnancy   . Tobacco abuse   . Alcohol abuse   . Drug abuse   . Hypertension Dx May 2016   Past Surgical History  Procedure Laterality Date  . Dilation and curettage of uterus     Family History  Problem Relation Age of Onset  . CAD Mother   . Hypertension Mother   . Heart disease Mother   . Kidney failure Mother   . CAD Brother   . Hypertension Other   . Kidney failure Other   . Hypertension Maternal Grandmother   . Kidney failure Maternal Grandmother   . Congestive Heart Failure Maternal Grandmother    Social History  Substance Use Topics  . Smoking status: Current Every Day Smoker -- 0.00 packs/day for 0 years    Types: Cigars, Cigarettes    Start date: 01/21/2015  . Smokeless tobacco: Former Systems developer  . Alcohol Use: 0.0 oz/week    0 Standard drinks or equivalent per week   OB History    No data available     Review of Systems  All other systems reviewed and are negative  Allergies  Review of patient's allergies indicates no known allergies.  Home Medications   Prior to Admission medications   Medication Sig Start Date End Date Taking? Authorizing Provider  simvastatin (ZOCOR) 10 MG tablet Take 1 tablet (10 mg total) by mouth at bedtime. 04/23/15  Yes Lance Bosch, NP  amLODipine (NORVASC) 10 MG tablet Take 1 tablet (10 mg total) by mouth daily. 03/09/16   Leonard Schwartz, MD  carvedilol (COREG) 12.5 MG tablet Take 1 tablet (12.5 mg total) by mouth 2 (two) times daily with a meal. 03/09/16   Leonard Schwartz, MD  hydrALAZINE (APRESOLINE) 100 MG tablet Take 1 tablet (100 mg  total) by mouth 3 (three) times daily. 03/09/16   Leonard Schwartz, MD   BP 162/106 mmHg  Pulse 89  Temp(Src) 98.7 F (37.1 C) (Oral)  Resp 18  SpO2 99%  LMP 03/06/2016 Physical Exam Physical Exam  Nursing note and vitals reviewed. Constitutional: She is oriented to person, place, and time. She appears well-developed and well-nourished. No distress.  HENT:  Head: Normocephalic and atraumatic.  Eyes: Pupils are equal, round, and reactive to light.  Neck: Normal range of motion.  Cardiovascular: Normal rate and intact distal pulses.   Pulmonary/Chest: No respiratory distress.  Abdominal: Normal appearance. She exhibits no distension.  Musculoskeletal: Normal range of motion.  Neurological: She is alert and oriented to person, place, and time. No cranial nerve deficit.  Skin: Skin is warm and dry. No rash noted.  Psychiatric: She has a normal mood and affect. Her behavior is normal.   ED Course  Procedures (including critical care time) Medications  hydrALAZINE (APRESOLINE) injection 10 mg ( Intravenous Given 03/09/16 1925)  hydrALAZINE (APRESOLINE) injection 10 mg (10 mg Intravenous Given 03/09/16 2028)    Labs Review Labs Reviewed  I-STAT CHEM 8, ED - Abnormal; Notable for the following:  Potassium 3.2 (*)    Creatinine, Ser 1.30 (*)    Calcium, Ion 1.07 (*)    All other components within normal limits  I-STAT TROPOININ, ED    Imaging Review No results found. I have personally reviewed and evaluated these images and lab results as part of my medical decision-making.   EKG Interpretation   Date/Time:  Sunday March 09 2016 17:26:11 EDT Ventricular Rate:  89 PR Interval:  162 QRS Duration: 94 QT Interval:  398 QTC Calculation: 484 R Axis:   12 Text Interpretation:  Normal sinus rhythm Biatrial enlargement Incomplete  right bundle branch block Nonspecific ST and T wave abnormality Prolonged  QT Abnormal ECG Confirmed by Flossie Wexler  MD, Linlee Cromie ([id number]) on 03/09/2016  6:01:40  PM Also confirmed by Audie Pinto  MD, Jarita Raval ([id number]), editor [place]   CCT, [place] ([id number])  on 03/10/2016 7:06:51 AM     After treatment in the ED the patient feels back to baseline and wants to go home. MDM   Final diagnoses:  Essential hypertension  Noncompliance with medications        Leonard Schwartz, MD 03/15/16 1031

## 2016-03-14 MED FILL — hydrALAZINE HCL 100 MG TABS: 100 | 30 days supply | Qty: 90 | Fill #0

## 2016-03-14 MED FILL — CARVEDILOL 12.5 MG TABLET: 12.5 | 30 days supply | Qty: 60 | Fill #0

## 2016-03-14 MED FILL — AMLODIPINE BESYLATE 10 MG T: 10 | 30 days supply | Qty: 30 | Fill #0

## 2016-03-15 ENCOUNTER — Encounter (HOSPITAL_COMMUNITY): Payer: Self-pay | Admitting: Emergency Medicine

## 2016-03-15 ENCOUNTER — Emergency Department (HOSPITAL_COMMUNITY)
Admission: EM | Admit: 2016-03-15 | Discharge: 2016-03-15 | Disposition: A | Discharge status: Home / Self Care | Payer: Medicaid Other | Attending: Emergency Medicine | Admitting: Emergency Medicine

## 2016-03-15 ENCOUNTER — Emergency Department (HOSPITAL_COMMUNITY): Payer: Medicaid Other

## 2016-03-15 DIAGNOSIS — Z79899 Other long term (current) drug therapy: Secondary | ICD-10-CM | POA: Insufficient documentation

## 2016-03-15 DIAGNOSIS — R519 Headache, unspecified: Secondary | ICD-10-CM

## 2016-03-15 DIAGNOSIS — I10 Essential (primary) hypertension: Secondary | ICD-10-CM

## 2016-03-15 DIAGNOSIS — F17210 Nicotine dependence, cigarettes, uncomplicated: Secondary | ICD-10-CM | POA: Insufficient documentation

## 2016-03-15 DIAGNOSIS — R51 Headache: Secondary | ICD-10-CM | POA: Insufficient documentation

## 2016-03-15 LAB — CBC
HEMATOCRIT: 43.6 % (ref 36.0–46.0)
Hemoglobin: 15.3 g/dL — ABNORMAL HIGH (ref 12.0–15.0)
MCH: 33.4 pg (ref 26.0–34.0)
MCHC: 35.1 g/dL (ref 30.0–36.0)
MCV: 95.2 fL (ref 78.0–100.0)
PLATELETS: 248 10*3/uL (ref 150–400)
RBC: 4.58 MIL/uL (ref 3.87–5.11)
RDW: 13.5 % (ref 11.5–15.5)
WBC: 10 10*3/uL (ref 4.0–10.5)

## 2016-03-15 LAB — BASIC METABOLIC PANEL
Anion gap: 11 (ref 5–15)
BUN: 18 mg/dL (ref 6–20)
CALCIUM: 9.6 mg/dL (ref 8.9–10.3)
CO2: 20 mmol/L — AB (ref 22–32)
Chloride: 106 mmol/L (ref 101–111)
Creatinine, Ser: 1.4 mg/dL — ABNORMAL HIGH (ref 0.44–1.00)
GFR calc Af Amer: 53 mL/min — ABNORMAL LOW (ref >60)
GFR, EST NON AFRICAN AMERICAN: 46 mL/min — AB (ref >60)
GLUCOSE: 87 mg/dL (ref 65–99)
Potassium: 3.1 mmol/L — ABNORMAL LOW (ref 3.5–5.1)
Sodium: 137 mmol/L (ref 135–145)

## 2016-03-15 MED ORDER — HYDRALAZINE HCL 20 MG/ML IJ SOLN
10.0000 mg | Freq: Once | INTRAMUSCULAR | Status: DC
  Filled 2016-03-15: qty 1

## 2016-03-15 MED ORDER — HYDRALAZINE HCL 20 MG/ML IJ SOLN
5.0000 mg | Freq: Once | INTRAMUSCULAR | Status: DC
  Filled 2016-03-15: qty 1

## 2016-03-15 MED ORDER — CARVEDILOL 12.5 MG PO TABS: 25.0000 mg | ORAL_TABLET | Freq: Two times a day (BID) | ORAL | Status: DC

## 2016-03-15 MED ORDER — HYDROCODONE-ACETAMINOPHEN 5-325 MG PO TABS
1.0000 | ORAL_TABLET | ORAL | Status: AC
  Administered 2016-03-15: 1 via ORAL
  Filled 2016-03-15: qty 1

## 2016-03-15 MED ORDER — SODIUM CHLORIDE 0.9 % IV SOLN
INTRAVENOUS | Status: DC
  Administered 2016-03-15: 23:00:00 via INTRAVENOUS

## 2016-03-15 MED ORDER — DIPHENHYDRAMINE HCL 50 MG/ML IJ SOLN
12.5000 mg | Freq: Once | INTRAMUSCULAR | Status: AC
  Administered 2016-03-15: 12.5 mg via INTRAVENOUS
  Filled 2016-03-15: qty 1

## 2016-03-15 MED ORDER — PROCHLORPERAZINE EDISYLATE 5 MG/ML IJ SOLN
10.0000 mg | Freq: Once | INTRAMUSCULAR | Status: AC
Start: 2016-03-15 — End: 2016-03-15
  Administered 2016-03-15: 10 mg via INTRAVENOUS
  Filled 2016-03-15: qty 2

## 2016-03-15 MED ORDER — HYDRALAZINE HCL 20 MG/ML IJ SOLN
5.0000 mg | Freq: Once | INTRAMUSCULAR | Status: AC
  Administered 2016-03-15: 5 mg via INTRAVENOUS

## 2016-03-15 NOTE — ED Notes (Signed)
Unable to obtain IV access. IV team consult awaiting. Will administer medications when IV access established.

## 2016-03-15 NOTE — ED Notes (Addendum)
Patient transported to CT. Medications will be administered when pt returns.

## 2016-03-15 NOTE — ED Provider Notes (Signed)
CSN: [id number]     Arrival date & time 03/15/16  1753 History   First MD Initiated Contact with Patient 03/15/16 2021     Chief Complaint  Patient presents with  . Hypertension   HPI Patient presents to the emergency room for evaluation of headache and hypertension. Patient has a history of hypertension. She ran out of her medications a couple weeks ago but was seen in the emergency room a few days later. She was noted be hypertensive and had her medications refilled on July 16. She states she has been taking them but her blood pressure remains elevated. She also has a bilateral headache in her temples. Some tingling. She denies any neck pain or stiffness. She denies any fevers or chills. No vomiting or diarrhea. She has a history of having headaches whenever her blood pressure gets elevated. Past Medical History  Diagnosis Date  . Ectopic pregnancy   . Tobacco abuse   . Alcohol abuse   . Drug abuse   . Hypertension Dx May 2016   Past Surgical History  Procedure Laterality Date  . Dilation and curettage of uterus     Family History  Problem Relation Age of Onset  . CAD Mother   . Hypertension Mother   . Heart disease Mother   . Kidney failure Mother   . CAD Brother   . Hypertension Other   . Kidney failure Other   . Hypertension Maternal Grandmother   . Kidney failure Maternal Grandmother   . Congestive Heart Failure Maternal Grandmother    Social History  Substance Use Topics  . Smoking status: Current Every Day Smoker -- 0.00 packs/day for 0 years    Types: Cigars, Cigarettes    Start date: 01/21/2015  . Smokeless tobacco: Former Systems developer  . Alcohol Use: 0.0 oz/week    0 Standard drinks or equivalent per week     Comment: socially   OB History    No data available     Review of Systems  All other systems reviewed and are negative.     Allergies  Review of patient's allergies indicates no known allergies.  Home Medications   Prior to Admission medications    Medication Sig Start Date End Date Taking? Authorizing Provider  amLODipine (NORVASC) 10 MG tablet Take 1 tablet (10 mg total) by mouth daily. 03/09/16  Yes Leonard Schwartz, MD  hydrALAZINE (APRESOLINE) 100 MG tablet Take 1 tablet (100 mg total) by mouth 3 (three) times daily. 03/09/16  Yes Leonard Schwartz, MD  simvastatin (ZOCOR) 10 MG tablet Take 1 tablet (10 mg total) by mouth at bedtime. 04/23/15  Yes Lance Bosch, NP  carvedilol (COREG) 12.5 MG tablet Take 2 tablets (25 mg total) by mouth 2 (two) times daily with a meal. 03/15/16   Dorie Rank, MD   BP 162/112 mmHg  Pulse 60  Temp(Src) 98.3 F (36.8 C) (Oral)  Resp 14  Ht 5\' 6"  (1.676 m)  Wt 68.04 kg  BMI 24.22 kg/m2  SpO2 99%  LMP 03/06/2016 (Exact Date) Physical Exam  Constitutional: She appears well-developed and well-nourished. No distress.  HENT:  Head: Normocephalic and atraumatic.  Right Ear: External ear normal.  Left Ear: External ear normal.  Eyes: Conjunctivae are normal. Right eye exhibits no discharge. Left eye exhibits no discharge. No scleral icterus.  Neck: Neck supple. No tracheal deviation present.  Cardiovascular: Normal rate, regular rhythm and intact distal pulses.   Pulmonary/Chest: Effort normal and breath sounds normal. No stridor. No  respiratory distress. She has no wheezes. She has no rales.  Abdominal: Soft. Bowel sounds are normal. She exhibits no distension. There is no tenderness. There is no rebound and no guarding.  Musculoskeletal: She exhibits no edema or tenderness.  Neurological: She is alert. She has normal strength. No cranial nerve deficit (no facial droop, extraocular movements intact, no slurred speech) or sensory deficit. She exhibits normal muscle tone. She displays no seizure activity. Coordination normal.  Skin: Skin is warm and dry. No rash noted.  Psychiatric: She has a normal mood and affect.  Nursing note and vitals reviewed.   ED Course  Procedures (including critical care  time) Labs Review Labs Reviewed  CBC - Abnormal; Notable for the following:    Hemoglobin 15.3 (*)    All other components within normal limits  BASIC METABOLIC PANEL - Abnormal; Notable for the following:    Potassium 3.1 (*)    CO2 20 (*)    Creatinine, Ser 1.40 (*)    GFR calc non Af Amer 46 (*)    GFR calc Af Amer 53 (*)    All other components within normal limits    Imaging Review Ct Head Wo Contrast  03/15/2016  CLINICAL DATA:  Headache, blurry vision, and tingling in the fingers of the left hand. Hypertension. EXAM: CT HEAD WITHOUT CONTRAST TECHNIQUE: Contiguous axial images were obtained from the base of the skull through the vertex without intravenous contrast. COMPARISON:  01/14/2015 FINDINGS: There is no evidence of acute cortical infarct, intracranial hemorrhage, mass, midline shift, or extra-axial collection. The ventricles and sulci are within normal limits for age. The visualized portions of the orbits are unremarkable. A left maxillary sinus polyp or mucous retention cyst is partially visualized. The mastoid air cells are clear. No acute osseous abnormality is identified. IMPRESSION: Unremarkable CT appearance of the brain. Electronically Signed   By: Logan Bores M.D.   On: 03/15/2016 22:56   I have personally reviewed and evaluated these images and lab results as part of my medical decision-making.  Medications given in the ED Medications  0.9 %  sodium chloride infusion ( Intravenous New Bag/Given 03/15/16 2241)  prochlorperazine (COMPAZINE) injection 10 mg (10 mg Intravenous Given 03/15/16 2242)  diphenhydrAMINE (BENADRYL) injection 12.5 mg (12.5 mg Intravenous Given 03/15/16 2241)  HYDROcodone-acetaminophen (NORCO/VICODIN) 5-325 MG per tablet 1 tablet (1 tablet Oral Given 03/15/16 2256)  hydrALAZINE (APRESOLINE) injection 5 mg (5 mg Intravenous Given 03/15/16 2309)      MDM   Final diagnoses:  Essential hypertension  Acute nonintractable headache, unspecified  headache type    No acute findings on head CT. The patient's blood pressure has decreased with a dose of hydralazine.  Laboratory tests are notable for a mild hypokalemia and an elevated creatinine.  I will have the patient increase her Coreg to 25 mg twice daily. I encouraged her to follow up with her primary care doctor review her blood pressure.    Dorie Rank, MD 03/15/16 [phone]

## 2016-03-15 NOTE — ED Notes (Signed)
Pt c/o hypertension continuing while on medication prescribed from 03/09/16 visit from [facility] ED. Pt reports HA, blurry vision and "tingling" in her fingers on her left hand. Pt denies strength difference. Pt states that she was discharged with pressure of 162/106 and stated "I still felt bad." Pt states that she came in today because "my headache won't go away and I can't eat."

## 2016-03-15 NOTE — Discharge Instructions (Signed)
Hypertension Hypertension, commonly called high blood pressure, is when the force of blood pumping through your arteries is too strong. Your arteries are the blood vessels that carry blood from your heart throughout your body. A blood pressure reading consists of a higher number over a lower number, such as 110/72. The higher number (systolic) is the pressure inside your arteries when your heart pumps. The lower number (diastolic) is the pressure inside your arteries when your heart relaxes. Ideally you want your blood pressure below 120/80. Hypertension forces your heart to work harder to pump blood. Your arteries may become narrow or stiff. Having untreated or uncontrolled hypertension can cause heart attack, stroke, kidney disease, and other problems. RISK FACTORS Some risk factors for high blood pressure are controllable. Others are not.  Risk factors you cannot control include:   Race. You may be at higher risk if you are African American.  Age. Risk increases with age.  Gender. Men are at higher risk than women before age 45 years. After age 65, women are at higher risk than men. Risk factors you can control include:  Not getting enough exercise or physical activity.  Being overweight.  Getting too much fat, sugar, calories, or salt in your diet.  Drinking too much alcohol. SIGNS AND SYMPTOMS Hypertension does not usually cause signs or symptoms. Extremely high blood pressure (hypertensive crisis) may cause headache, anxiety, shortness of breath, and nosebleed. DIAGNOSIS To check if you have hypertension, your health care provider will measure your blood pressure while you are seated, with your arm held at the level of your heart. It should be measured at least twice using the same arm. Certain conditions can cause a difference in blood pressure between your right and left arms. A blood pressure reading that is higher than normal on one occasion does not mean that you need treatment. If  it is not clear whether you have high blood pressure, you may be asked to return on a different day to have your blood pressure checked again. Or, you may be asked to monitor your blood pressure at home for 1 or more weeks. TREATMENT Treating high blood pressure includes making lifestyle changes and possibly taking medicine. Living a healthy lifestyle can help lower high blood pressure. You may need to change some of your habits. Lifestyle changes may include:  Following the DASH diet. This diet is high in fruits, vegetables, and whole grains. It is low in salt, red meat, and added sugars.  Keep your sodium intake below 2,300 mg per day.  Getting at least 30-45 minutes of aerobic exercise at least 4 times per week.  Losing weight if necessary.  Not smoking.  Limiting alcoholic beverages.  Learning ways to reduce stress. Your health care provider may prescribe medicine if lifestyle changes are not enough to get your blood pressure under control, and if one of the following is true:  You are 18-59 years of age and your systolic blood pressure is above 140.  You are 60 years of age or older, and your systolic blood pressure is above 150.  Your diastolic blood pressure is above 90.  You have diabetes, and your systolic blood pressure is over 140 or your diastolic blood pressure is over 90.  You have kidney disease and your blood pressure is above 140/90.  You have heart disease and your blood pressure is above 140/90. Your personal target blood pressure may vary depending on your medical conditions, your age, and other factors. HOME CARE INSTRUCTIONS    Have your blood pressure rechecked as directed by your health care provider.   Take medicines only as directed by your health care provider. Follow the directions carefully. Blood pressure medicines must be taken as prescribed. The medicine does not work as well when you skip doses. Skipping doses also puts you at risk for  problems.  Do not smoke.   Monitor your blood pressure at home as directed by your health care provider. SEEK MEDICAL CARE IF:   You think you are having a reaction to medicines taken.  You have recurrent headaches or feel dizzy.  You have swelling in your ankles.  You have trouble with your vision. SEEK IMMEDIATE MEDICAL CARE IF:  You develop a severe headache or confusion.  You have unusual weakness, numbness, or feel faint.  You have severe chest or abdominal pain.  You vomit repeatedly.  You have trouble breathing. MAKE SURE YOU:   Understand these instructions.  Will watch your condition.  Will get help right away if you are not doing well or get worse.   This information is not intended to replace advice given to you by your health care provider. Make sure you discuss any questions you have with your health care provider.   Document Released: 08/11/2005 Document Revised: 12/26/2014 Document Reviewed: 06/03/2013 Elsevier Interactive Patient Education 2016 [employer]  

## 2016-03-23 ENCOUNTER — Emergency Department (HOSPITAL_COMMUNITY)
Admission: EM | Admit: 2016-03-23 | Discharge: 2016-03-23 | Disposition: A | Discharge status: Home / Self Care | Payer: Medicaid Other | Attending: Emergency Medicine | Admitting: Emergency Medicine

## 2016-03-23 ENCOUNTER — Encounter (HOSPITAL_COMMUNITY): Payer: Self-pay | Admitting: Emergency Medicine

## 2016-03-23 DIAGNOSIS — Z79899 Other long term (current) drug therapy: Secondary | ICD-10-CM | POA: Insufficient documentation

## 2016-03-23 DIAGNOSIS — F17210 Nicotine dependence, cigarettes, uncomplicated: Secondary | ICD-10-CM | POA: Insufficient documentation

## 2016-03-23 DIAGNOSIS — R1011 Right upper quadrant pain: Secondary | ICD-10-CM | POA: Diagnosis present

## 2016-03-23 DIAGNOSIS — E876 Hypokalemia: Secondary | ICD-10-CM

## 2016-03-23 DIAGNOSIS — I10 Essential (primary) hypertension: Secondary | ICD-10-CM | POA: Insufficient documentation

## 2016-03-23 LAB — COMPREHENSIVE METABOLIC PANEL
ALBUMIN: 4.5 g/dL (ref 3.5–5.0)
ALK PHOS: 39 U/L (ref 38–126)
ALT: 18 U/L (ref 14–54)
AST: 20 U/L (ref 15–41)
Anion gap: 7 (ref 5–15)
BUN: 14 mg/dL (ref 6–20)
CALCIUM: 9.3 mg/dL (ref 8.9–10.3)
CO2: 24 mmol/L (ref 22–32)
CREATININE: 1.2 mg/dL — AB (ref 0.44–1.00)
Chloride: 108 mmol/L (ref 101–111)
GFR calc Af Amer: >60 mL/min (ref >60)
GFR calc non Af Amer: 55 mL/min — ABNORMAL LOW (ref >60)
GLUCOSE: 103 mg/dL — AB (ref 65–99)
Potassium: 3 mmol/L — ABNORMAL LOW (ref 3.5–5.1)
SODIUM: 139 mmol/L (ref 135–145)
Total Bilirubin: 0.7 mg/dL (ref 0.3–1.2)
Total Protein: 7.2 g/dL (ref 6.5–8.1)

## 2016-03-23 LAB — URINALYSIS, ROUTINE W REFLEX MICROSCOPIC
Bilirubin Urine: NEGATIVE
Glucose, UA: NEGATIVE mg/dL
HGB URINE DIPSTICK: NEGATIVE
Ketones, ur: NEGATIVE mg/dL
Leukocytes, UA: NEGATIVE
Nitrite: NEGATIVE
PH: 6.5 (ref 5.0–8.0)
Protein, ur: NEGATIVE mg/dL
SPECIFIC GRAVITY, URINE: 1.011 (ref 1.005–1.030)

## 2016-03-23 LAB — WET PREP, GENITAL
Sperm: NONE SEEN
TRICH WET PREP: NONE SEEN
Yeast Wet Prep HPF POC: NONE SEEN

## 2016-03-23 LAB — CBC
HCT: 38.9 % (ref 36.0–46.0)
Hemoglobin: 13.8 g/dL (ref 12.0–15.0)
MCH: 33.7 pg (ref 26.0–34.0)
MCHC: 35.5 g/dL (ref 30.0–36.0)
MCV: 94.9 fL (ref 78.0–100.0)
PLATELETS: 322 10*3/uL (ref 150–400)
RBC: 4.1 MIL/uL (ref 3.87–5.11)
RDW: 13 % (ref 11.5–15.5)
WBC: 9.2 10*3/uL (ref 4.0–10.5)

## 2016-03-23 LAB — I-STAT BETA HCG BLOOD, ED (MC, WL, AP ONLY)

## 2016-03-23 LAB — LIPASE, BLOOD: Lipase: 55 U/L — ABNORMAL HIGH (ref 11–51)

## 2016-03-23 MED ORDER — POTASSIUM CHLORIDE CRYS ER 20 MEQ PO TBCR
40.0000 meq | EXTENDED_RELEASE_TABLET | Freq: Once | ORAL | Status: AC
  Administered 2016-03-23: 40 meq via ORAL
  Filled 2016-03-23: qty 2

## 2016-03-23 NOTE — ED Notes (Signed)
Pelvic setup and ready

## 2016-03-23 NOTE — Progress Notes (Signed)
Pt denies any chest pain. Pts primary nurse aware of elevated BP. Pt stated she is due to take BP meds when she gets home.

## 2016-03-23 NOTE — ED Provider Notes (Signed)
[facility] DEPT Provider Note   CSN: [id number] Arrival date & time: 03/23/16  1359  First Provider Contact:  First MD Initiated Contact with Patient 03/23/16 1545        History   Chief Complaint Chief Complaint  Patient presents with  . Abdominal Pain    HPI Melody [name] is a 40 y.o. female.  The history is provided by the patient. No language interpreter was used.  Abdominal Pain    Melody [name] is a 40 y.o. female who presents to the Emergency Department complaining of RUQ abdominal pain, vaginal bleeding.  She developed sharp, RUQ abdominal pain four days ago.  She developed heavy vaginal bleeding 7/24, using about three pads daily.  Passing small clots.  Her last normal LMP started on 7/8 and ended 7/13.  Lately her cycles have been irregular.  Her abdominal pain is waxing and waning, described as cramping and sharp.  Pain is worse with movement and certain positions.  No fevers, chest pain, SOB, cough, N/V, dysyuria, diarrhea.    Past Medical History:  Diagnosis Date  . Alcohol abuse   . Drug abuse   . Ectopic pregnancy   . Hypertension Dx May 2016  . Tobacco abuse     Patient Active Problem List   Diagnosis Date Noted  . Pelvic fullness in female 10/12/2015  . Accelerated hypertension 03/12/2015  . AKI (acute kidney injury) ([place]) 01/14/2015  . Tobacco abuse   . Drug abuse     Past Surgical History:  Procedure Laterality Date  . DILATION AND CURETTAGE OF UTERUS      OB History    No data available       Home Medications    Prior to Admission medications   Medication Sig Start Date End Date Taking? Authorizing Provider  amLODipine (NORVASC) 10 MG tablet Take 1 tablet (10 mg total) by mouth daily. 03/09/16  Yes Leonard Schwartz, MD  carvedilol (COREG) 12.5 MG tablet Take 2 tablets (25 mg total) by mouth 2 (two) times daily with a meal. Patient taking differently: Take 12.5 mg by mouth 2 (two) times daily with a meal.  03/15/16  Yes Dorie Rank, MD  hydrALAZINE (APRESOLINE) 100 MG tablet Take 1 tablet (100 mg total) by mouth 3 (three) times daily. 03/09/16  Yes Leonard Schwartz, MD  simvastatin (ZOCOR) 10 MG tablet Take 1 tablet (10 mg total) by mouth at bedtime. 04/23/15  Yes Lance Bosch, NP    Family History Family History  Problem Relation Age of Onset  . CAD Mother   . Hypertension Mother   . Heart disease Mother   . Kidney failure Mother   . CAD Brother   . Hypertension Other   . Kidney failure Other   . Hypertension Maternal Grandmother   . Kidney failure Maternal Grandmother   . Congestive Heart Failure Maternal Grandmother     Social History Social History  Substance Use Topics  . Smoking status: Current Every Day Smoker    Packs/day: 0.00    Years: 0.00    Types: Cigars, Cigarettes    Start date: 01/21/2015  . Smokeless tobacco: Former Systems developer  . Alcohol use 0.0 oz/week     Comment: socially     Allergies   Review of patient's allergies indicates no known allergies.   Review of Systems Review of Systems  Gastrointestinal: Positive for abdominal pain.  All other systems reviewed and are negative.    Physical Exam Updated Vital Signs BP Marland Kitchen)  156/103 (BP Location: Left Arm)   Pulse 100   Temp 98.7 F (37.1 C) (Oral)   Resp 17   Ht 5' 6.5" (1.689 m)   Wt 138 lb 2 oz (62.7 kg)   LMP 03/06/2016 (Exact Date)   SpO2 100%   BMI 21.96 kg/m   Physical Exam  Constitutional: She is oriented to person, place, and time. She appears well-developed and well-nourished.  HENT:  Head: Normocephalic and atraumatic.  Cardiovascular: Normal rate and regular rhythm.   No murmur heard. Pulmonary/Chest: Effort normal and breath sounds normal. No respiratory distress.  Abdominal: Soft. There is no rebound and no guarding.  Mild suprapubic and RUQ tenderness  Genitourinary:  Genitourinary Comments: Small amount of vaginal bleeding, small amount of discharge.  No CMT or adnexal tenderness.      Musculoskeletal: She exhibits no edema or tenderness.  Neurological: She is alert and oriented to person, place, and time.  Skin: Skin is warm and dry.  Psychiatric: She has a normal mood and affect. Her behavior is normal.  Nursing note and vitals reviewed.    ED Treatments / Results  Labs (all labs ordered are listed, but only abnormal results are displayed) Labs Reviewed  WET PREP, GENITAL - Abnormal; Notable for the following:       Result Value   Clue Cells Wet Prep HPF POC PRESENT (*)    WBC, Wet Prep HPF POC MODERATE (*)    All other components within normal limits  LIPASE, BLOOD - Abnormal; Notable for the following:    Lipase 55 (*)    All other components within normal limits  COMPREHENSIVE METABOLIC PANEL - Abnormal; Notable for the following:    Potassium 3.0 (*)    Glucose, Bld 103 (*)    Creatinine, Ser 1.20 (*)    GFR calc non Af Amer 55 (*)    All other components within normal limits  CBC  URINALYSIS, ROUTINE W REFLEX MICROSCOPIC (NOT AT [facility])  I-STAT BETA HCG BLOOD, ED ([facility], [facility], [facility] ONLY)  GC/CHLAMYDIA PROBE AMP ([facility]) NOT AT [facility]    EKG  EKG Interpretation None       Radiology No results found.  Procedures Procedures (including critical care time)  Medications Ordered in ED Medications  potassium chloride SA (K-DUR,KLOR-CON) CR tablet 40 mEq (40 mEq Oral Given 03/23/16 1738)     Initial Impression / Assessment and Plan / ED Course  I have reviewed the triage vital signs and the nursing notes.  Pertinent labs & imaging results that were available during my care of the patient were reviewed by me and considered in my medical decision making (see chart for details).  Clinical Course    Patient here for evaluation of right upper quadrant pain and vaginal bleeding. She has minimal bleeding on examination with slight discharge. She does have clue cells on wet prep but no symptoms of BV, will not treat at this time. Presentation is not  consistent with renal colic, cholecystitis, PID. Discussed with patient on care for irregular menses, abdominal pain. Discussed outpatient follow-up and return precautions. BMP with renal insufficiency that is stable when compared to priors.  Final Clinical Impressions(s) / ED Diagnoses   Final diagnoses:  RUQ abdominal pain  Hypokalemia    New Prescriptions Discharge Medication List as of 03/23/2016  6:08 PM       Quintella Reichert, MD 03/23/16 2320

## 2016-03-23 NOTE — ED Triage Notes (Signed)
Patient states that on the 4th day after she started her menstrual cycle she has RUQ abd cramp that has been there for going on 4 days now even though bleeding has stopped.  Patient denies any n/v/d or constipation.

## 2016-03-23 NOTE — ED Notes (Signed)
Saw pt holding a bag of popcorn (after labs were drawn) - did advise pt not to eat until we got some results back.

## 2016-03-24 ENCOUNTER — Ambulatory Visit: Payer: Medicaid Other | Admitting: Family Medicine

## 2016-03-24 LAB — GC/CHLAMYDIA PROBE AMP (~~LOC~~) NOT AT ARMC
Chlamydia: NEGATIVE
NEISSERIA GONORRHEA: NEGATIVE

## 2016-07-22 MED FILL — hydrALAZINE HCL 100 MG TABS: 100 | 30 days supply | Qty: 90 | Fill #1

## 2016-07-22 MED FILL — CARVEDILOL 12.5 MG TABLET: 12.5 | 30 days supply | Qty: 60 | Fill #1

## 2016-09-12 ENCOUNTER — Emergency Department (HOSPITAL_COMMUNITY): Payer: Medicaid Other

## 2016-09-12 ENCOUNTER — Inpatient Hospital Stay (HOSPITAL_COMMUNITY)
Admission: EM | Admit: 2016-09-12 | Discharge: 2016-09-19 | DRG: 252 | Disposition: A | Discharge status: Home / Self Care | Payer: Medicaid Other | Attending: Internal Medicine | Admitting: Internal Medicine

## 2016-09-12 ENCOUNTER — Encounter (HOSPITAL_COMMUNITY): Payer: Self-pay

## 2016-09-12 DIAGNOSIS — E872 Acidosis: Secondary | ICD-10-CM | POA: Diagnosis present

## 2016-09-12 DIAGNOSIS — Z992 Dependence on renal dialysis: Secondary | ICD-10-CM | POA: Diagnosis not present

## 2016-09-12 DIAGNOSIS — R0600 Dyspnea, unspecified: Secondary | ICD-10-CM | POA: Diagnosis not present

## 2016-09-12 DIAGNOSIS — D6959 Other secondary thrombocytopenia: Secondary | ICD-10-CM | POA: Diagnosis present

## 2016-09-12 DIAGNOSIS — I132 Hypertensive heart and chronic kidney disease with heart failure and with stage 5 chronic kidney disease, or end stage renal disease: Secondary | ICD-10-CM | POA: Diagnosis present

## 2016-09-12 DIAGNOSIS — J9690 Respiratory failure, unspecified, unspecified whether with hypoxia or hypercapnia: Secondary | ICD-10-CM

## 2016-09-12 DIAGNOSIS — R51 Headache: Secondary | ICD-10-CM | POA: Diagnosis present

## 2016-09-12 DIAGNOSIS — D631 Anemia in chronic kidney disease: Secondary | ICD-10-CM | POA: Diagnosis present

## 2016-09-12 DIAGNOSIS — J811 Chronic pulmonary edema: Secondary | ICD-10-CM | POA: Diagnosis present

## 2016-09-12 DIAGNOSIS — F17200 Nicotine dependence, unspecified, uncomplicated: Secondary | ICD-10-CM | POA: Diagnosis present

## 2016-09-12 DIAGNOSIS — Z841 Family history of disorders of kidney and ureter: Secondary | ICD-10-CM | POA: Diagnosis not present

## 2016-09-12 DIAGNOSIS — N186 End stage renal disease: Secondary | ICD-10-CM | POA: Diagnosis present

## 2016-09-12 DIAGNOSIS — D696 Thrombocytopenia, unspecified: Secondary | ICD-10-CM

## 2016-09-12 DIAGNOSIS — I161 Hypertensive emergency: Secondary | ICD-10-CM | POA: Diagnosis not present

## 2016-09-12 DIAGNOSIS — I5032 Chronic diastolic (congestive) heart failure: Secondary | ICD-10-CM | POA: Diagnosis present

## 2016-09-12 DIAGNOSIS — E871 Hypo-osmolality and hyponatremia: Secondary | ICD-10-CM | POA: Diagnosis present

## 2016-09-12 DIAGNOSIS — E876 Hypokalemia: Secondary | ICD-10-CM | POA: Diagnosis present

## 2016-09-12 DIAGNOSIS — I160 Hypertensive urgency: Secondary | ICD-10-CM | POA: Diagnosis not present

## 2016-09-12 DIAGNOSIS — I158 Other secondary hypertension: Secondary | ICD-10-CM | POA: Diagnosis present

## 2016-09-12 DIAGNOSIS — Z9889 Other specified postprocedural states: Secondary | ICD-10-CM

## 2016-09-12 DIAGNOSIS — Z79899 Other long term (current) drug therapy: Secondary | ICD-10-CM

## 2016-09-12 DIAGNOSIS — Z4931 Encounter for adequacy testing for hemodialysis: Secondary | ICD-10-CM

## 2016-09-12 DIAGNOSIS — I080 Rheumatic disorders of both mitral and aortic valves: Secondary | ICD-10-CM | POA: Diagnosis present

## 2016-09-12 DIAGNOSIS — N179 Acute kidney failure, unspecified: Secondary | ICD-10-CM | POA: Diagnosis not present

## 2016-09-12 DIAGNOSIS — F1011 Alcohol abuse, in remission: Secondary | ICD-10-CM | POA: Diagnosis present

## 2016-09-12 DIAGNOSIS — Z8249 Family history of ischemic heart disease and other diseases of the circulatory system: Secondary | ICD-10-CM

## 2016-09-12 DIAGNOSIS — D638 Anemia in other chronic diseases classified elsewhere: Secondary | ICD-10-CM

## 2016-09-12 DIAGNOSIS — Z419 Encounter for procedure for purposes other than remedying health state, unspecified: Secondary | ICD-10-CM

## 2016-09-12 DIAGNOSIS — N2581 Secondary hyperparathyroidism of renal origin: Secondary | ICD-10-CM | POA: Diagnosis present

## 2016-09-12 DIAGNOSIS — J9601 Acute respiratory failure with hypoxia: Secondary | ICD-10-CM

## 2016-09-12 DIAGNOSIS — N185 Chronic kidney disease, stage 5: Secondary | ICD-10-CM | POA: Diagnosis not present

## 2016-09-12 LAB — I-STAT TROPONIN, ED: Troponin i, poc: 0.39 ng/mL (ref 0.00–0.08)

## 2016-09-12 LAB — CBC
HCT: 21.9 % — ABNORMAL LOW (ref 36.0–46.0)
HEMOGLOBIN: 7.9 g/dL — AB (ref 12.0–15.0)
MCH: 32.8 pg (ref 26.0–34.0)
MCHC: 36.1 g/dL — ABNORMAL HIGH (ref 30.0–36.0)
MCV: 90.9 fL (ref 78.0–100.0)
PLATELETS: 100 10*3/uL — AB (ref 150–400)
RBC: 2.41 MIL/uL — AB (ref 3.87–5.11)
RDW: 13.9 % (ref 11.5–15.5)
WBC: 11.5 10*3/uL — AB (ref 4.0–10.5)

## 2016-09-12 LAB — BASIC METABOLIC PANEL
ANION GAP: 18 — AB (ref 5–15)
BUN: 98 mg/dL — ABNORMAL HIGH (ref 6–20)
CO2: 12 mmol/L — AB (ref 22–32)
CREATININE: 15.78 mg/dL — AB (ref 0.44–1.00)
Calcium: 8.7 mg/dL — ABNORMAL LOW (ref 8.9–10.3)
Chloride: 102 mmol/L (ref 101–111)
GFR calc non Af Amer: 2 mL/min — ABNORMAL LOW (ref >60)
GFR, EST AFRICAN AMERICAN: 3 mL/min — AB (ref >60)
Glucose, Bld: 119 mg/dL — ABNORMAL HIGH (ref 65–99)
Potassium: 3 mmol/L — ABNORMAL LOW (ref 3.5–5.1)
SODIUM: 132 mmol/L — AB (ref 135–145)

## 2016-09-12 LAB — BRAIN NATRIURETIC PEPTIDE: B NATRIURETIC PEPTIDE 5: 2410 pg/mL — AB (ref 0.0–100.0)

## 2016-09-12 MED ORDER — DIPHENHYDRAMINE HCL 50 MG/ML IJ SOLN
25.0000 mg | Freq: Once | INTRAMUSCULAR | Status: AC
  Administered 2016-09-12: 25 mg via INTRAVENOUS
  Filled 2016-09-12: qty 1

## 2016-09-12 MED ORDER — PROCHLORPERAZINE EDISYLATE 5 MG/ML IJ SOLN
10.0000 mg | Freq: Once | INTRAMUSCULAR | Status: AC
  Administered 2016-09-12: 10 mg via INTRAVENOUS
  Filled 2016-09-12: qty 2

## 2016-09-12 MED ORDER — FUROSEMIDE 10 MG/ML IJ SOLN
40.0000 mg | Freq: Once | INTRAMUSCULAR | Status: AC
  Administered 2016-09-12: 40 mg via INTRAVENOUS
  Filled 2016-09-12: qty 4

## 2016-09-12 MED ORDER — DEXAMETHASONE SODIUM PHOSPHATE 10 MG/ML IJ SOLN
10.0000 mg | Freq: Once | INTRAMUSCULAR | Status: AC
  Administered 2016-09-12: 10 mg via INTRAVENOUS
  Filled 2016-09-12: qty 1

## 2016-09-12 MED ORDER — HYDRALAZINE HCL 50 MG PO TABS
100.0000 mg | ORAL_TABLET | Freq: Once | ORAL | Status: AC
  Administered 2016-09-12: 100 mg via ORAL
  Filled 2016-09-12: qty 2

## 2016-09-12 MED ORDER — MAGNESIUM SULFATE 2 GM/50ML IV SOLN
2.0000 g | Freq: Once | INTRAVENOUS | Status: AC
  Administered 2016-09-12: 2 g via INTRAVENOUS
  Filled 2016-09-12: qty 50

## 2016-09-12 MED ORDER — CARVEDILOL 12.5 MG PO TABS
12.5000 mg | ORAL_TABLET | Freq: Once | ORAL | Status: AC
  Administered 2016-09-12: 12.5 mg via ORAL
  Filled 2016-09-12: qty 1

## 2016-09-12 MED ORDER — NITROGLYCERIN IN D5W 200-5 MCG/ML-% IV SOLN
0.0000 ug/min | Freq: Once | INTRAVENOUS | Status: AC
  Administered 2016-09-12: 5 ug/min via INTRAVENOUS
  Filled 2016-09-12: qty 250

## 2016-09-12 NOTE — ED Notes (Signed)
Attempted blood draw x2, was unsuccessful

## 2016-09-12 NOTE — ED Notes (Signed)
ATTEMPTED BLOOD WORK X2 UNSUCCESSFUL

## 2016-09-12 NOTE — ED Provider Notes (Signed)
[facility] DEPT Provider Note   CSN: [id number] Arrival date & time: 09/12/16  1140     History   Chief Complaint Chief Complaint  Patient presents with  . Headache  . Neck Pain  . Chest Pain    HPI Melody [name] is a 41 y.o. female.  The history is provided by the patient.  Headache   This is a recurrent problem. Episode onset: 5 days; resolved 2 days ago; and returned yesterday. Episode frequency: intermittent. The problem has not changed since onset.Associated with: high BP. Pain location: generalized. The quality of the pain is described as dull. The pain is moderate. The pain does not radiate. Associated symptoms include chest pressure, orthopnea, shortness of breath, nausea and vomiting. Pertinent negatives include no fever and no near-syncope. She has tried acetaminophen for the symptoms. The treatment provided no relief.  Chest Pain   This is a new problem. The current episode started 2 days ago. Episode frequency: intermittent. The problem has not changed since onset.Associated with: certain positions. Pain location: left chest. The pain is moderate. The quality of the pain is described as pressure-like. The pain does not radiate. Associated symptoms include abdominal pain (epigastric; constant for 2 days, aching; no modifying factors.), back pain (upper back), headaches, nausea, orthopnea, shortness of breath and vomiting. Pertinent negatives include no cough, no fever, no lower extremity edema and no near-syncope. Risk factors include smoking/tobacco exposure and alcohol intake.  Her past medical history is significant for hypertension.  Pertinent negatives for past medical history include no COPD, no CHF, no diabetes, no DVT, no hyperlipidemia, no PE and no strokes.  Her family medical history is significant for early MI.    Past Medical History:  Diagnosis Date  . Alcohol abuse   . Drug abuse   . Ectopic pregnancy   . Hypertension Dx May 2016  . Tobacco abuse      Patient Active Problem List   Diagnosis Date Noted  . Pelvic fullness in female 10/12/2015  . Accelerated hypertension 03/12/2015  . AKI (acute kidney injury) ([place]) 01/14/2015  . Tobacco abuse   . Drug abuse     Past Surgical History:  Procedure Laterality Date  . DILATION AND CURETTAGE OF UTERUS      OB History    No data available       Home Medications    Prior to Admission medications   Medication Sig Start Date End Date Taking? Authorizing Provider  carvedilol (COREG) 12.5 MG tablet Take 2 tablets (25 mg total) by mouth 2 (two) times daily with a meal. Patient taking differently: Take 12.5 mg by mouth 2 (two) times daily with a meal.  03/15/16  Yes Dorie Rank, MD  diphenhydramine-acetaminophen (TYLENOL PM) 25-500 MG TABS tablet Take 1 tablet by mouth at bedtime as needed.   Yes Historical Provider, MD  hydrALAZINE (APRESOLINE) 100 MG tablet Take 1 tablet (100 mg total) by mouth 3 (three) times daily. 03/09/16  Yes Leonard Schwartz, MD  amLODipine (NORVASC) 10 MG tablet Take 1 tablet (10 mg total) by mouth daily. Patient not taking: Reported on 09/12/2016 03/09/16   Leonard Schwartz, MD  simvastatin (ZOCOR) 10 MG tablet Take 1 tablet (10 mg total) by mouth at bedtime. Patient not taking: Reported on 09/12/2016 04/23/15   Lance Bosch, NP    Family History Family History  Problem Relation Age of Onset  . CAD Mother   . Hypertension Mother   . Heart disease Mother   .  Kidney failure Mother   . CAD Brother   . Hypertension Other   . Kidney failure Other   . Hypertension Maternal Grandmother   . Kidney failure Maternal Grandmother   . Congestive Heart Failure Maternal Grandmother     Social History Social History  Substance Use Topics  . Smoking status: Current Every Day Smoker    Packs/day: 0.00    Years: 0.00    Types: Cigars, Cigarettes    Start date: 01/21/2015  . Smokeless tobacco: Former Systems developer  . Alcohol use 0.0 oz/week     Comment: socially      Allergies   Patient has no known allergies.   Review of Systems Review of Systems  Constitutional: Negative for fever.  Respiratory: Positive for shortness of breath. Negative for cough.   Cardiovascular: Positive for chest pain and orthopnea. Negative for near-syncope.  Gastrointestinal: Positive for abdominal pain (epigastric; constant for 2 days, aching; no modifying factors.), nausea and vomiting.  Musculoskeletal: Positive for back pain (upper back).  Neurological: Positive for headaches.  Ten systems are reviewed and are negative for acute change except as noted in the HPI    Physical Exam Updated Vital Signs BP (!) 246/154 (BP Location: Right Arm)   Pulse 100   Temp 98.8 F (37.1 C) (Oral)   Resp 20   LMP 09/11/2016   SpO2 100%   Physical Exam  Constitutional: She is oriented to person, place, and time. She appears well-developed and well-nourished. No distress.  HENT:  Head: Normocephalic and atraumatic.  Nose: Nose normal.  Eyes: Conjunctivae and EOM are normal. Pupils are equal, round, and reactive to light. Right eye exhibits no discharge. Left eye exhibits no discharge. No scleral icterus.  Neck: Normal range of motion. Neck supple.  Cardiovascular: Normal rate and regular rhythm.  Exam reveals no gallop and no friction rub.   No murmur heard. Pulmonary/Chest: Effort normal and breath sounds normal. No stridor. No respiratory distress. She has no rales. She exhibits tenderness.    Abdominal: Soft. She exhibits no distension. There is no tenderness.  Musculoskeletal: She exhibits no edema or tenderness.  Neurological: She is alert and oriented to person, place, and time.  Mental Status: Alert and oriented to person, place, and time. Attention and concentration normal. Speech clear. Recent memory is intac  Cranial Nerves  II Visual Fields: Intact to confrontation. Visual fields intact. III, IV, VI: Pupils equal and reactive to light and near. Full eye  movement without nystagmus  V Facial Sensation: Normal. No weakness of masticatory muscles  VII: No facial weakness or asymmetry  VIII Auditory Acuity: Grossly normal  IX/X: The uvula is midline; the palate elevates symmetrically  XI: Normal sternocleidomastoid and trapezius strength  XII: The tongue is midline. No atrophy or fasciculations.   Motor System: Muscle Strength: 5/5 and symmetric in the upper and lower extremities. No pronation or drift.  Muscle Tone: Tone and muscle bulk are normal in the upper and lower extremities.   Reflexes: DTRs: 2+ and symmetrical in all four extremities. Plantar responses are flexor bilaterally.  Coordination: Intact finger-to-nose, heel-to-shin, and rapid alternating movements. No tremor.  Sensation: Intact to light touch, and pinprick. Negative Romberg test.  Gait: Routine gait normal    Skin: Skin is warm and dry. No rash noted. She is not diaphoretic. No erythema.  Psychiatric: She has a normal mood and affect.  Vitals reviewed.    ED Treatments / Results  Labs (all labs ordered are listed, but only  abnormal results are displayed) Labs Reviewed  BRAIN NATRIURETIC PEPTIDE - Abnormal; Notable for the following:       Result Value   B Natriuretic Peptide 2,410.0 (*)    All other components within normal limits  BASIC METABOLIC PANEL - Abnormal; Notable for the following:    Sodium 132 (*)    Potassium 3.0 (*)    CO2 12 (*)    Glucose, Bld 119 (*)    BUN 98 (*)    Creatinine, Ser 15.78 (*)    Calcium 8.7 (*)    GFR calc non Af Amer 2 (*)    GFR calc Af Amer 3 (*)    Anion gap 18 (*)    All other components within normal limits  CBC - Abnormal; Notable for the following:    WBC 11.5 (*)    RBC 2.41 (*)    Hemoglobin 7.9 (*)    HCT 21.9 (*)    MCHC 36.1 (*)    Platelets 100 (*)    All other components within normal limits  I-STAT TROPOININ, ED - Abnormal; Notable for the following:    Troponin i, poc 0.39 (*)    All other  components within normal limits  MRSA PCR SCREENING  PHOSPHORUS  COMPREHENSIVE METABOLIC PANEL  TROPONIN I  TROPONIN I  TROPONIN I  LACTIC ACID, PLASMA  LACTIC ACID, PLASMA  PROCALCITONIN  CBC    EKG  EKG Interpretation None       Radiology Dg Chest 2 View  Result Date: 09/12/2016 CLINICAL DATA:  Body ache, fevers, chest pain and cough. Dyspnea times 3-4 days. EXAM: CHEST  2 VIEW COMPARISON:  12/28/2012 FINDINGS: Cardiomediastinal contours are normal. Patchy airspace opacities consistent with pneumonia in the right upper, middle and lower lobes. No effusion or pneumothorax. No acute osseous abnormality. IMPRESSION: Patchy airspace disease in involving the right lung consistent multilobar pneumonia. Electronically Signed   By: Ashley Royalty M.D.   On: 09/12/2016 12:33    Procedures Procedures (including critical care time) CRITICAL CARE Performed by: Grayce Sessions Alletta Mattos Total critical care time: 45 minutes Critical care time was exclusive of separately billable procedures and treating other patients. Critical care was necessary to treat or prevent imminent or life-threatening deterioration. Critical care was time spent personally by me on the following activities: development of treatment plan with patient and/or surrogate as well as nursing, discussions with consultants, evaluation of patient's response to treatment, examination of patient, obtaining history from patient or surrogate, ordering and performing treatments and interventions, ordering and review of laboratory studies, ordering and review of radiographic studies, pulse oximetry and re-evaluation of patient's condition.   Medications Ordered in ED Medications  nitroGLYCERIN 50 mg in dextrose 5 % 250 mL (0.2 mg/mL) infusion (85 mcg/min Intravenous Rate/Dose Change 09/13/16 0148)  0.9 %  sodium chloride infusion (not administered)  heparin injection 5,000 Units (5,000 Units Subcutaneous Not Given 09/13/16 0143)  sodium  chloride flush (NS) 0.9 % injection 3 mL (3 mLs Intravenous Given 09/13/16 0144)  sodium chloride flush (NS) 0.9 % injection 3 mL (not administered)  0.9 %  sodium chloride infusion (not administered)  labetalol (NORMODYNE,TRANDATE) injection 10 mg (not administered)  diphenhydrAMINE (BENADRYL) injection 25 mg (25 mg Intravenous Given 09/12/16 1905)  dexamethasone (DECADRON) injection 10 mg (10 mg Intravenous Given 09/12/16 1905)  prochlorperazine (COMPAZINE) injection 10 mg (10 mg Intravenous Given 09/12/16 1905)  magnesium sulfate IVPB 2 g 50 mL (0 g Intravenous Stopped 09/12/16 1950)  carvedilol (COREG)  tablet 12.5 mg (12.5 mg Oral Given 09/12/16 1855)  hydrALAZINE (APRESOLINE) tablet 100 mg (100 mg Oral Given 09/12/16 1855)  furosemide (LASIX) injection 40 mg (40 mg Intravenous Given 09/12/16 2049)  nitroGLYCERIN 50 mg in dextrose 5 % 250 mL (0.2 mg/mL) infusion (0 mcg/min Intravenous Stopped 09/13/16 0117)  aspirin chewable tablet 324 mg (324 mg Oral Given 09/13/16 0149)    Or  aspirin suppository 300 mg ( Rectal See Alternative 09/13/16 0149)     Initial Impression / Assessment and Plan / ED Course  I have reviewed the triage vital signs and the nursing notes.  Pertinent labs & imaging results that were available during my care of the patient were reviewed by me and considered in my medical decision making (see chart for details).     Hypertensive emergency with renal failure, heart strain, congestive heart failure with localized pulmonary edema. Placed on nitroglycerin drip for BP control and admitted to the ICU for continued management.  Final Clinical Impressions(s) / ED Diagnoses   Final diagnoses:  Hypertensive emergency  Acute renal failure, unspecified acute renal failure type [facility])      Fatima Blank, MD 09/13/16 [phone]

## 2016-09-12 NOTE — ED Notes (Signed)
Colletta Maryland, RN is at bedside for [place] IV and bloodwork

## 2016-09-12 NOTE — ED Triage Notes (Signed)
Pt c/o headache, posterior neck, bilateral shoulder pain, and n/v x 1 week and intermittent L chest pain x 3 days.  Pain score 9/10.  Pt reports chest pain increases at night.  Pt reports taking Tylenol last night w/ some relief.  Hx of HTN and polysubstance abuse.

## 2016-09-12 NOTE — ED Notes (Signed)
Pt's right arm where [place] IV was placed began to swell and pt reported pain.  Mag infusion stopped and MD notified.  Removed IV on R side.  Lab called asking for redraw of light green and lavender tops.  Placed [place] at bedside for MD to place new [place] IV.

## 2016-09-13 ENCOUNTER — Inpatient Hospital Stay (HOSPITAL_COMMUNITY): Payer: Medicaid Other

## 2016-09-13 DIAGNOSIS — R0600 Dyspnea, unspecified: Secondary | ICD-10-CM

## 2016-09-13 DIAGNOSIS — I160 Hypertensive urgency: Secondary | ICD-10-CM | POA: Diagnosis present

## 2016-09-13 LAB — RAPID URINE DRUG SCREEN, HOSP PERFORMED
Amphetamines: NOT DETECTED
Barbiturates: NOT DETECTED
Benzodiazepines: NOT DETECTED
Cocaine: NOT DETECTED
OPIATES: NOT DETECTED
Tetrahydrocannabinol: POSITIVE — AB

## 2016-09-13 LAB — COMPREHENSIVE METABOLIC PANEL
ALK PHOS: 42 U/L (ref 38–126)
ALT: 17 U/L (ref 14–54)
AST: 17 U/L (ref 15–41)
Albumin: 3.3 g/dL — ABNORMAL LOW (ref 3.5–5.0)
Anion gap: 16 — ABNORMAL HIGH (ref 5–15)
BUN: 103 mg/dL — ABNORMAL HIGH (ref 6–20)
CALCIUM: 8.7 mg/dL — AB (ref 8.9–10.3)
CO2: 13 mmol/L — ABNORMAL LOW (ref 22–32)
CREATININE: 16.05 mg/dL — AB (ref 0.44–1.00)
Chloride: 99 mmol/L — ABNORMAL LOW (ref 101–111)
GFR, EST AFRICAN AMERICAN: 3 mL/min — AB (ref >60)
GFR, EST NON AFRICAN AMERICAN: 2 mL/min — AB (ref >60)
Glucose, Bld: 219 mg/dL — ABNORMAL HIGH (ref 65–99)
Potassium: 3.2 mmol/L — ABNORMAL LOW (ref 3.5–5.1)
Sodium: 128 mmol/L — ABNORMAL LOW (ref 135–145)
Total Bilirubin: 0.6 mg/dL (ref 0.3–1.2)
Total Protein: 5.9 g/dL — ABNORMAL LOW (ref 6.5–8.1)

## 2016-09-13 LAB — ETHANOL: Alcohol, Ethyl (B): <5 mg/dL (ref <5)

## 2016-09-13 LAB — TROPONIN I
TROPONIN I: 0.33 ng/mL — AB (ref <0.03)
TROPONIN I: 0.24 ng/mL — AB (ref <0.03)
Troponin I: 0.19 ng/mL (ref <0.03)

## 2016-09-13 LAB — ECHOCARDIOGRAM COMPLETE
HEIGHTINCHES: 66 in
WEIGHTICAEL: 2215.18 [oz_av]

## 2016-09-13 LAB — CBC
HEMATOCRIT: 20.5 % — AB (ref 36.0–46.0)
HEMOGLOBIN: 7.4 g/dL — AB (ref 12.0–15.0)
MCH: 32 pg (ref 26.0–34.0)
MCHC: 36.1 g/dL — AB (ref 30.0–36.0)
MCV: 88.7 fL (ref 78.0–100.0)
Platelets: 81 10*3/uL — ABNORMAL LOW (ref 150–400)
RBC: 2.31 MIL/uL — ABNORMAL LOW (ref 3.87–5.11)
RDW: 13.8 % (ref 11.5–15.5)
WBC: 6.8 10*3/uL (ref 4.0–10.5)

## 2016-09-13 LAB — ACETAMINOPHEN LEVEL: Acetaminophen (Tylenol), Serum: <10 ug/mL — ABNORMAL LOW (ref 10–30)

## 2016-09-13 LAB — PROCALCITONIN: PROCALCITONIN: 1.38 ng/mL

## 2016-09-13 LAB — PREGNANCY, URINE: Preg Test, Ur: NEGATIVE

## 2016-09-13 LAB — SALICYLATE LEVEL: Salicylate Lvl: <7 mg/dL (ref 2.8–30.0)

## 2016-09-13 LAB — PROTEIN / CREATININE RATIO, URINE
Creatinine, Urine: 143 mg/dL
Protein Creatinine Ratio: 4.38 mg/mg{Cre} — ABNORMAL HIGH (ref 0.00–0.15)
Total Protein, Urine: 627 mg/dL

## 2016-09-13 LAB — MRSA PCR SCREENING: MRSA BY PCR: NEGATIVE

## 2016-09-13 LAB — LACTIC ACID, PLASMA
Lactic Acid, Venous: 0.8 mmol/L (ref 0.5–1.9)
Lactic Acid, Venous: 0.7 mmol/L (ref 0.5–1.9)

## 2016-09-13 LAB — PHOSPHORUS: Phosphorus: 8.2 mg/dL — ABNORMAL HIGH (ref 2.5–4.6)

## 2016-09-13 LAB — TSH: TSH: 0.373 u[IU]/mL (ref 0.350–4.500)

## 2016-09-13 MED ORDER — SODIUM CHLORIDE 0.9 % IV SOLN: 250.0000 mL | INTRAVENOUS | Status: DC | PRN

## 2016-09-13 MED ORDER — METOPROLOL TARTRATE 25 MG PO TABS
50.0000 mg | ORAL_TABLET | Freq: Two times a day (BID) | ORAL | Status: DC
  Administered 2016-09-13 – 2016-09-14 (×3): 50 mg via ORAL
  Filled 2016-09-13 (×3): qty 2

## 2016-09-13 MED ORDER — ACETAMINOPHEN 325 MG PO TABS
650.0000 mg | ORAL_TABLET | Freq: Four times a day (QID) | ORAL | Status: DC | PRN
  Administered 2016-09-13 – 2016-09-14 (×3): 650 mg via ORAL
  Filled 2016-09-13 (×3): qty 2

## 2016-09-13 MED ORDER — SODIUM CHLORIDE 0.9% FLUSH
3.0000 mL | Freq: Two times a day (BID) | INTRAVENOUS | Status: DC
  Administered 2016-09-13 – 2016-09-17 (×6): 3 mL via INTRAVENOUS

## 2016-09-13 MED ORDER — LABETALOL HCL 5 MG/ML IV SOLN
0.5000 mg/min | INTRAVENOUS | Status: DC
  Administered 2016-09-13: 0.5 mg/min via INTRAVENOUS
  Filled 2016-09-13 (×2): qty 100
  Filled 2016-09-13: qty 80

## 2016-09-13 MED ORDER — AMLODIPINE BESYLATE 10 MG PO TABS
10.0000 mg | ORAL_TABLET | Freq: Every day | ORAL | Status: DC
  Administered 2016-09-13 – 2016-09-18 (×6): 10 mg via ORAL
  Filled 2016-09-13 (×6): qty 1

## 2016-09-13 MED ORDER — HEPARIN SODIUM (PORCINE) 5000 UNIT/ML IJ SOLN
5000.0000 [IU] | Freq: Three times a day (TID) | INTRAMUSCULAR | Status: DC
  Administered 2016-09-13 – 2016-09-16 (×8): 5000 [IU] via SUBCUTANEOUS
  Filled 2016-09-13 (×9): qty 1

## 2016-09-13 MED ORDER — NICOTINE 7 MG/24HR TD PT24
7.0000 mg | MEDICATED_PATCH | Freq: Every day | TRANSDERMAL | Status: DC
  Administered 2016-09-13 – 2016-09-19 (×7): 7 mg via TRANSDERMAL
  Filled 2016-09-13 (×7): qty 1

## 2016-09-13 MED ORDER — SODIUM CHLORIDE 0.9% FLUSH: 3.0000 mL | INTRAVENOUS | Status: DC | PRN

## 2016-09-13 MED ORDER — LABETALOL HCL 5 MG/ML IV SOLN
10.0000 mg | INTRAVENOUS | Status: DC | PRN
  Administered 2016-09-13 (×2): 10 mg via INTRAVENOUS
  Filled 2016-09-13 (×2): qty 4

## 2016-09-13 MED ORDER — HYDROMORPHONE HCL 1 MG/ML IJ SOLN
0.5000 mg | INTRAMUSCULAR | Status: DC | PRN
  Administered 2016-09-13 – 2016-09-17 (×13): 0.5 mg via INTRAVENOUS
  Filled 2016-09-13 (×4): qty 0.5
  Filled 2016-09-13 (×8): qty 1

## 2016-09-13 MED ORDER — ASPIRIN 300 MG RE SUPP: 300.0000 mg | RECTAL | Status: AC

## 2016-09-13 MED ORDER — SODIUM BICARBONATE 650 MG PO TABS
650.0000 mg | ORAL_TABLET | Freq: Two times a day (BID) | ORAL | Status: DC
  Administered 2016-09-13 – 2016-09-14 (×3): 650 mg via ORAL
  Filled 2016-09-13 (×3): qty 1

## 2016-09-13 MED ORDER — HYDRALAZINE HCL 20 MG/ML IJ SOLN: 10.0000 mg | INTRAMUSCULAR | Status: DC | PRN

## 2016-09-13 MED ORDER — ASPIRIN 81 MG PO CHEW
324.0000 mg | CHEWABLE_TABLET | ORAL | Status: AC
  Administered 2016-09-13: 324 mg via ORAL
  Filled 2016-09-13: qty 4

## 2016-09-13 MED ORDER — NITROGLYCERIN IN D5W 200-5 MCG/ML-% IV SOLN
0.0000 ug/min | INTRAVENOUS | Status: DC
  Administered 2016-09-13: 110 ug/min via INTRAVENOUS
  Filled 2016-09-13: qty 250

## 2016-09-13 MED ORDER — SODIUM CHLORIDE 0.9 % IV BOLUS (SEPSIS)
1000.0000 mL | Freq: Once | INTRAVENOUS | Status: AC
  Administered 2016-09-13: 500 mL via INTRAVENOUS

## 2016-09-13 NOTE — Consult Note (Signed)
Referring Provider: No ref. provider found Primary Care Physician:  Minerva Ends, MD Primary Nephrologist:    Reason for Consultation:  Malignant Hypertension  Renal Failure with a creatinine that has increased from baseline 1.2 to 16   Echogenic Kidneys   HPI: Melody [name] is a 41 y/o woman with pmh significant for HTN on amolodipine who presented to the [facility] with a 1 week history of headache, abdominal pain and occasional chest pain. She also reports some orthopnea.  She was found to have a systolic BP of ~811 and Cr of 16.  She last had her BP and labs checked in July 2017 at which time her BP was 150/100 and Cr was 1.2.  She has not noticed any decrease in urine output or change in urine characteristics.  She has not had any lower extremity edema.  She has had some shortness of breath when lying down over the last week.  She has a strong family history of hypertension and renal failure.  She smokes marijuana regularly.  She previously smoked about 1/2 ppd of cigarettes but has cut down to 3/day.  She does not regularly consume alcohol.   Patient was taking coreg and hydralazine as an outpatient since July. She has not been monitoring blood pressure at home and has had no office follow up  Family history positive for ESRD  Tobacco  No NSAIDS  No sympatomimetics  No coccaine use    Past Medical History:  Diagnosis Date  . Alcohol abuse   . Drug abuse   . Ectopic pregnancy   . Hypertension Dx May 2016  . Tobacco abuse     Past Surgical History:  Procedure Laterality Date  . DILATION AND CURETTAGE OF UTERUS      Prior to Admission medications   Medication Sig Start Date End Date Taking? Authorizing Provider  carvedilol (COREG) 12.5 MG tablet Take 2 tablets (25 mg total) by mouth 2 (two) times daily with a meal. Patient taking differently: Take 12.5 mg by mouth 2 (two) times daily with a meal.  03/15/16  Yes Dorie Rank, MD  diphenhydramine-acetaminophen (TYLENOL PM) 25-500 MG  TABS tablet Take 1 tablet by mouth at bedtime as needed.   Yes Historical Provider, MD  hydrALAZINE (APRESOLINE) 100 MG tablet Take 1 tablet (100 mg total) by mouth 3 (three) times daily. 03/09/16  Yes Leonard Schwartz, MD  amLODipine (NORVASC) 10 MG tablet Take 1 tablet (10 mg total) by mouth daily. Patient not taking: Reported on 09/12/2016 03/09/16   Leonard Schwartz, MD  simvastatin (ZOCOR) 10 MG tablet Take 1 tablet (10 mg total) by mouth at bedtime. Patient not taking: Reported on 09/12/2016 04/23/15   Lance Bosch, NP    Current Facility-Administered Medications  Medication Dose Route Frequency Provider Last Rate Last Dose  . 0.9 %  sodium chloride infusion  250 mL Intravenous PRN Guadelupe Sabin Deterding, MD      . 0.9 %  sodium chloride infusion  250 mL Intravenous PRN Colbert Coyer, MD      . acetaminophen (TYLENOL) tablet 650 mg  650 mg Oral Q6H PRN Guy Begin, MD   650 mg at 09/13/16 0641  . amLODipine (NORVASC) tablet 10 mg  10 mg Oral Daily Guy Begin, MD   10 mg at 09/13/16 1100  . heparin injection 5,000 Units  5,000 Units Subcutaneous Q8H Colbert Coyer, MD   5,000 Units at 09/13/16 [phone]  . HYDROmorphone (DILAUDID) injection 0.5 mg  0.5 mg Intravenous Q2H PRN Guy Begin, MD      . labetalol (NORMODYNE,TRANDATE) 500 mg in dextrose 5 % 125 mL (4 mg/mL) infusion  0.5-3 mg/min Intravenous Titrated Guy Begin, MD 7.5 mL/hr at 09/13/16 0800 0.5 mg/min at 09/13/16 0800  . metoprolol tartrate (LOPRESSOR) tablet 50 mg  50 mg Oral BID Guy Begin, MD   50 mg at 09/13/16 0641  . nicotine (NICODERM CQ - dosed in mg/24 hr) patch 7 mg  7 mg Transdermal Daily Guy Begin, MD   7 mg at 09/13/16 1100  . nitroGLYCERIN 50 mg in dextrose 5 % 250 mL (0.2 mg/mL) infusion  0-200 mcg/min Intravenous Continuous Guadelupe Sabin Deterding, MD 16.5 mL/hr at 09/13/16 1024 55 mcg/min at 09/13/16 1024  . sodium bicarbonate tablet 650 mg  650 mg Oral BID Guy Begin,  MD   650 mg at 09/13/16 1100  . sodium chloride flush (NS) 0.9 % injection 3 mL  3 mL Intravenous Q12H Colbert Coyer, MD   3 mL at 09/13/16 1000  . sodium chloride flush (NS) 0.9 % injection 3 mL  3 mL Intravenous PRN Colbert Coyer, MD        Allergies as of 09/12/2016  . (No Known Allergies)    Family History  Problem Relation Age of Onset  . CAD Mother   . Hypertension Mother   . Heart disease Mother   . Kidney failure Mother   . CAD Brother   . Hypertension Other   . Kidney failure Other   . Hypertension Maternal Grandmother   . Kidney failure Maternal Grandmother   . Congestive Heart Failure Maternal Grandmother     Social History   Social History  . Marital status: Single    Spouse name: N/A  . Number of children: 2   . Years of education: some colle   Occupational History  . Not on file.   Social History Main Topics  . Smoking status: Current Every Day Smoker    Packs/day: 0.00    Years: 0.00    Types: Cigars, Cigarettes    Start date: 01/21/2015  . Smokeless tobacco: Former Systems developer  . Alcohol use 0.0 oz/week     Comment: socially  . Drug use: Yes    Types: Marijuana  . Sexual activity: Yes    Birth control/ protection: None   Other Topics Concern  . Not on file   Social History Narrative   Looking for work    24 and 13 yr son's.    3 grandkids        Review of Systems: Gen: Denies any fever, chills, sweats, anorexia, fatigue, weakness, malaise, weight loss, and sleep disorder HEENT: No visual complaints, No history of Retinopathy. Normal external appearance No Epistaxis or Sore throat. No sinusitis.   CV: Denies chest pain, angina, palpitations, syncope, orthopnea, PND, peripheral edema, and claudication. Resp: Denies dyspnea at rest, dyspnea with exercise, cough, sputum, wheezing, coughing up blood, and pleurisy. GI: Denies vomiting blood, jaundice, and fecal incontinence.   Denies dysphagia or odynophagia. GU : Denies urinary  burning, blood in urine, urinary frequency, urinary hesitancy, nocturnal urination, and urinary incontinence.  No renal calculi. MS: Denies joint pain, limitation of movement, and swelling, stiffness, low back pain, extremity pain. Denies muscle weakness, cramps, atrophy.  No use of non steroidal antiinflammatory drugs. Derm: Denies rash, itching, dry skin, hives, moles, warts, or unhealing ulcers.  Psych: Denies depression, anxiety, memory loss, suicidal  ideation, hallucinations, paranoia, and confusion. Heme: Denies bruising, bleeding, and enlarged lymph nodes. Neuro: No headache.  No diplopia. No dysarthria.  No dysphasia.  No history of CVA.  No Seizures. No paresthesias.  No weakness. Endocrine No DM.  No Thyroid disease.  No Adrenal disease.  Physical Exam: Vital signs in last 24 hours: Temp:  [98.1 F (36.7 C)-98.8 F (37.1 C)] 98.7 F (37.1 C) (01/20 0400) Pulse Rate:  [70-104] 74 (01/20 0945) Resp:  [13-40] 23 (01/20 0945) BP: (116-264)/(84-161) 154/103 (01/20 1100) SpO2:  [92 %-100 %] 100 % (01/20 0945) Weight:  [62.8 kg (138 lb 7.2 oz)] 62.8 kg (138 lb 7.2 oz) (01/20 0121) Last BM Date:  (PTA) General:   Alert,  Well-developed, well-nourished, pleasant and cooperative in NAD Head:  Normocephalic and atraumatic. Eyes:  Sclera clear, no icterus.   Conjunctiva pink. Ears:  Normal auditory acuity. Nose:  No deformity, discharge,  or lesions. Mouth:  No deformity or lesions, dentition normal. Neck:  Supple; no masses or thyromegaly. JVP not elevated Lungs:  Clear throughout to auscultation.   No wheezes, crackles, or rhonchi. No acute distress. Heart:  Regular rate and rhythm; no murmurs, clicks, rubs,  or gallops. Abdomen:  Soft, nontender and nondistended. No masses, hepatosplenomegaly or hernias noted. Normal bowel sounds, without guarding, and without rebound.   Msk:  Symmetrical without gross deformities. Normal posture. Pulses:  No carotid, renal, femoral bruits. DP and PT  symmetrical and equal Extremities:  Without clubbing or edema. Neurologic:  Alert and  oriented x4;  grossly normal neurologically. Skin:  Intact without significant lesions or rashes. Cervical Nodes:  No significant cervical adenopathy. Psych:  Alert and cooperative. Normal mood and affect.  Intake/Output from previous day: 01/19 0701 - 01/20 0700 In: 186.5 [I.V.:136.5; IV Piggyback:50] Out: 550 [Urine:550] Intake/Output this shift: Total I/O In: 141 [I.V.:141] Out: -   Lab Results:  Recent Labs  09/12/16 2044 09/13/16 0204  WBC 11.5* 6.8  HGB 7.9* 7.4*  HCT 21.9* 20.5*  PLT 100* 81*   BMET  Recent Labs  09/12/16 2044 09/13/16 0204  NA 132* 128*  K 3.0* 3.2*  CL 102 99*  CO2 12* 13*  GLUCOSE 119* 219*  BUN 98* 103*  CREATININE 15.78* 16.05*  CALCIUM 8.7* 8.7*  PHOS  --  8.2*   LFT  Recent Labs  09/13/16 0204  PROT 5.9*  ALBUMIN 3.3*  AST 17  ALT 17  ALKPHOS 42  BILITOT 0.6   PT/INR No results for input(s): LABPROT, INR in the last 72 hours. Hepatitis Panel No results for input(s): HEPBSAG, HCVAB, HEPAIGM, HEPBIGM in the last 72 hours.  Studies/Results: Dg Chest 1 View  Result Date: 09/13/2016 CLINICAL DATA:  Respiratory failure EXAM: CHEST 1 VIEW COMPARISON:  Chest radiograph from one day prior. FINDINGS: Stable cardiomediastinal silhouette with mild cardiomegaly. No pneumothorax. No pleural effusion. Bilateral lung opacities have nearly resolved, with minimal residual right parahilar fluffy opacity. No acute consolidative airspace disease. IMPRESSION: 1. Stable mild cardiomegaly. 2. Near complete resolution of bilateral lung opacities, consistent with nearly resolved pulmonary edema. Electronically Signed   By: Ilona Sorrel M.D.   On: 09/13/2016 09:23   Dg Chest 2 View  Result Date: 09/12/2016 CLINICAL DATA:  Body ache, fevers, chest pain and cough. Dyspnea times 3-4 days. EXAM: CHEST  2 VIEW COMPARISON:  12/28/2012 FINDINGS: Cardiomediastinal  contours are normal. Patchy airspace opacities consistent with pneumonia in the right upper, middle and lower lobes. No effusion or pneumothorax. No  acute osseous abnormality. IMPRESSION: Patchy airspace disease in involving the right lung consistent multilobar pneumonia. Electronically Signed   By: Ashley Royalty M.D.   On: 09/12/2016 12:33   US Renal  Result Date: 09/13/2016 CLINICAL DATA:  Acute renal failure. EXAM: RENAL / URINARY TRACT ULTRASOUND COMPLETE COMPARISON:  01/15/2015 renal sonogram. FINDINGS: Right Kidney: Length: 9.7 cm. Echogenic right kidney. No right hydronephrosis. No right renal mass. Left Kidney: Length: 9.4 cm. Echogenic left kidney. No left hydronephrosis. No left renal mass. Bladder: Appears normal for degree of bladder distention. Bilateral ureteral jets are demonstrated in the bladder. IMPRESSION: 1. No hydronephrosis. 2. Echogenic normal size kidneys bilaterally, indicating renal parenchymal disease of uncertain chronicity. 3. Normal bladder. Electronically Signed   By: Ilona Sorrel M.D.   On: 09/13/2016 09:20    Assessment/Plan:  Renal failure associated with malignant hypertension  From previously uncontrolled blood pressure. I think it reasonable to check some serological studies in this lady although doubt that these will be positive. I do not think it likely that she will recover function and will most likely need dialysis   Hypertension  Appears better controlled on IV nitro and IV labetalol  Anemia  Stable  Thrombocytopenia  Secondary to Malignant Hypertension     LOS: 1 Aryan Bello W @TODAY @11 :18 AM

## 2016-09-13 NOTE — Progress Notes (Signed)
  Echocardiogram 2D Echocardiogram has been performed.  Tresa Res 09/13/2016, 12:18 PM

## 2016-09-13 NOTE — H&P (Signed)
PULMONARY / CRITICAL CARE MEDICINE   Name: Melody [name] MRN: [id number] DOB: [birth year]    ADMISSION DATE:  09/12/2016 CONSULTATION DATE:  09/12/2016  REFERRING MD:  EDP  CHIEF COMPLAINT:  Hypertensive emergency  HISTORY OF PRESENT ILLNESS:   Melody [name] is a 41 y/o woman with pmh significant for HTN on amolodipine who presented to the [facility] with a 1 week history of headache, abdominal pain and occasional chest pain. She also reports some orthopnea.  She was found to have a systolic BP of ~191 and Cr of 16.  She last had her BP and labs checked in July 2017 at which time her BP was 150/100 and Cr was 1.2.  She has not noticed any decrease in urine output or change in urine characteristics.  She has not had any lower extremity edema.  She has had some shortness of breath when lying down over the last week.  She has a strong family history of hypertension and renal failure.  She smokes marijuana regularly.  She previously smoked about 1/2 ppd of cigarettes but has cut down to 3/day.  She does not regularly consume alcohol.   PAST MEDICAL HISTORY :  She  has a past medical history of Alcohol abuse; Drug abuse; Ectopic pregnancy; Hypertension (Dx May 2016); and Tobacco abuse.  PAST SURGICAL HISTORY: She  has a past surgical history that includes Dilation and curettage of uterus.  No Known Allergies  No current facility-administered medications on file prior to encounter.    Current Outpatient Prescriptions on File Prior to Encounter  Medication Sig  . carvedilol (COREG) 12.5 MG tablet Take 2 tablets (25 mg total) by mouth 2 (two) times daily with a meal. (Patient taking differently: Take 12.5 mg by mouth 2 (two) times daily with a meal. )  . hydrALAZINE (APRESOLINE) 100 MG tablet Take 1 tablet (100 mg total) by mouth 3 (three) times daily.  Marland Kitchen amLODipine (NORVASC) 10 MG tablet Take 1 tablet (10 mg total) by mouth daily. (Patient not taking: Reported on 09/12/2016)  . simvastatin (ZOCOR) 10  MG tablet Take 1 tablet (10 mg total) by mouth at bedtime. (Patient not taking: Reported on 09/12/2016)    FAMILY HISTORY:  Her indicated that her mother is alive. She indicated that her adoptive father is deceased. She has 3 siblings and 2 children who are healthy. She indicated that the status of her maternal grandmother was on dialysis and died when she was 2.  SOCIAL HISTORY: She  reports that she has been smoking 3 cigarettes daily and smokes marijuana daily.  REVIEW OF SYSTEMS:   A review of 14 systems was negative except as stated in the HPI.   SUBJECTIVE:  41 y/o woman with hypertensive emergency and acute renal failure.   VITAL SIGNS: BP (!) 207/130   Pulse 76   Temp 98.7 F (37.1 C) (Oral)   Resp 16   Ht 5\' 6"  (1.676 m)   Wt 62.8 kg (138 lb 7.2 oz)   LMP 09/11/2016   SpO2 99%   BMI 22.35 kg/m   HEMODYNAMICS:     INTAKE / OUTPUT: No intake/output data recorded.  PHYSICAL EXAMINATION: General:  Well appearing woman laying in bed in no apparent distress.  Neuro:  Alert and oriented HEENT:  OP clear, PERRL, EOMI Cardiovascular:  NRRR, II/VI systolic murmur throughout precordium Lungs:  Faint crackles in LLL, good air movement throughout, no chest wall deformity Abdomen:  Soft NTND, no palpable HSM Musculoskeletal:  No edema  Skin:  No rashes, bruises or other lesions.   LABS:  BMET  Recent Labs Lab 09/12/16 2044 09/13/16 0204  NA 132* 128*  K 3.0* 3.2*  CL 102 99*  CO2 12* 13*  BUN 98* 103*  CREATININE 15.78* 16.05*  GLUCOSE 119* 219*    Electrolytes  Recent Labs Lab 09/12/16 2044 09/13/16 0204  CALCIUM 8.7* 8.7*  PHOS  --  8.2*    CBC  Recent Labs Lab 09/12/16 2044 09/13/16 0204  WBC 11.5* 6.8  HGB 7.9* 7.4*  HCT 21.9* 20.5*  PLT 100* 81*    Coag's No results for input(s): APTT, INR in the last 168 hours.  Sepsis Markers  Recent Labs Lab 09/13/16 0204 09/13/16 0431  LATICACIDVEN 0.8 0.7  PROCALCITON 1.38  --      ABG No results for input(s): PHART, PCO2ART, PO2ART in the last 168 hours.  Liver Enzymes  Recent Labs Lab 09/13/16 0204  AST 17  ALT 17  ALKPHOS 42  BILITOT 0.6  ALBUMIN 3.3*    Cardiac Enzymes  Recent Labs Lab 09/13/16 0204  TROPONINI 0.33*    Glucose No results for input(s): GLUCAP in the last 168 hours.  Imaging Dg Chest 2 View  Result Date: 09/12/2016 CLINICAL DATA:  Body ache, fevers, chest pain and cough. Dyspnea times 3-4 days. EXAM: CHEST  2 VIEW COMPARISON:  12/28/2012 FINDINGS: Cardiomediastinal contours are normal. Patchy airspace opacities consistent with pneumonia in the right upper, middle and lower lobes. No effusion or pneumothorax. No acute osseous abnormality. IMPRESSION: Patchy airspace disease in involving the right lung consistent multilobar pneumonia. Electronically Signed   By: Melody [name] M.D.   On: 09/12/2016 12:33     STUDIES:    CULTURES:   ANTIBIOTICS:   SIGNIFICANT EVENTS:   LINES/TUBES:   DISCUSSION: 41 y/o woman with hypertensive emergency and acute renal failure  ASSESSMENT / PLAN:  PULMONARY A: Mild pulmonary edema P:   2/2 hypertension, no O2 requirement  CARDIOVASCULAR A:  Hypertensive emergency P:  Started on nitroglycerin gtt in ED Will change nitroglycerin gtt to labetalol gtt as she seems to be responding better to this - consider changing to fenoldopam - initial goal was SBP <200 - will slowly titrate to SBP of 160. Oral metoprolol 50mg  BID Amlodipine 10mg  daily (home dose) hydralizine as needed for SBP>180  RENAL A:   AKI 2/2 hypertensive emergency P:   No acute need for dialysis Consult nephrology Renal u/s Avoid nephrotoxic agents Add oral bicarb for metabolic acidosis  GASTROINTESTINAL A:   Abdominal pain P:   Likely 2/2 hypertension Monitor   HEMATOLOGIC A:   Anemia P:  Likely 2/2 renal disease    NEUROLOGIC A:   Headache P:   2/2 hypertension Tylenol    FAMILY   - Updates:   - Inter-disciplinary family meet or Palliative Care meeting due by:  1/26  I spent 35 minutes of critical care time in the care of this patient separate from procedures which are documented elsewhere   P[facility] and [place] Pager: [phone]  09/13/2016, 6:55 AM

## 2016-09-13 NOTE — Progress Notes (Signed)
Notified ELink MD of abnormal BMET values. Will continue to monitor.

## 2016-09-13 NOTE — Progress Notes (Signed)
   [facility] P[facility]M  Pt was just seen by Dr. Lincoln Maxin.  She was switched to Labetalol drip from Nitro drip as nitro caused headaches.  BP 116/110  HR 80 RR 18  100% on RA. Pt seen, comfortable, NAD. Laying flat.  NV not prominent.  Good ae, CTA, good s1/s2. Abd was benign.  CXR with possible pulm edema. BNP was elavated as well. Troponin was elevated.   Plan : 1. As per Dr. Lincoln Maxin.  2. Case discussed with nephrology.  3. Needs foley. 4. Will try bolus with 500 mls./ she appeared dry. WOF congestion. 5. Check UDS, etoh, salicylate, apap levels, pregnancy test.  6. Need kidney US.    Monica Becton, MD 09/13/2016, 8:25 AM [facility] [facility] Pager [phone] After 3 pm or if no answer, call [phone]

## 2016-09-14 DIAGNOSIS — I161 Hypertensive emergency: Principal | ICD-10-CM

## 2016-09-14 DIAGNOSIS — N179 Acute kidney failure, unspecified: Secondary | ICD-10-CM

## 2016-09-14 LAB — COMPLEMENT, TOTAL: Compl, Total (CH50): >60 U/mL — ABNORMAL HIGH (ref 42–60)

## 2016-09-14 LAB — CBC WITH DIFFERENTIAL/PLATELET
BASOS ABS: 0 10*3/uL (ref 0.0–0.1)
BASOS PCT: 0 %
Eosinophils Absolute: 0 10*3/uL (ref 0.0–0.7)
Eosinophils Relative: 0 %
HEMATOCRIT: 19.1 % — AB (ref 36.0–46.0)
HEMOGLOBIN: 6.9 g/dL — AB (ref 12.0–15.0)
Lymphocytes Relative: 17 %
Lymphs Abs: 1.5 10*3/uL (ref 0.7–4.0)
MCH: 32.2 pg (ref 26.0–34.0)
MCHC: 36.1 g/dL — AB (ref 30.0–36.0)
MCV: 89.3 fL (ref 78.0–100.0)
MONO ABS: 0.5 10*3/uL (ref 0.1–1.0)
MONOS PCT: 5 %
NEUTROS ABS: 6.9 10*3/uL (ref 1.7–7.7)
NEUTROS PCT: 77 %
Platelets: 130 10*3/uL — ABNORMAL LOW (ref 150–400)
RBC: 2.14 MIL/uL — ABNORMAL LOW (ref 3.87–5.11)
RDW: 13.9 % (ref 11.5–15.5)
WBC: 9 10*3/uL (ref 4.0–10.5)

## 2016-09-14 LAB — BASIC METABOLIC PANEL
Anion gap: 19 — ABNORMAL HIGH (ref 5–15)
BUN: 122 mg/dL — ABNORMAL HIGH (ref 6–20)
CHLORIDE: 97 mmol/L — AB (ref 101–111)
CO2: 13 mmol/L — ABNORMAL LOW (ref 22–32)
Calcium: 8.4 mg/dL — ABNORMAL LOW (ref 8.9–10.3)
Creatinine, Ser: 16.29 mg/dL — ABNORMAL HIGH (ref 0.44–1.00)
GFR, EST AFRICAN AMERICAN: 3 mL/min — AB (ref >60)
GFR, EST NON AFRICAN AMERICAN: 2 mL/min — AB (ref >60)
Glucose, Bld: 129 mg/dL — ABNORMAL HIGH (ref 65–99)
POTASSIUM: 3.3 mmol/L — AB (ref 3.5–5.1)
SODIUM: 129 mmol/L — AB (ref 135–145)

## 2016-09-14 LAB — HIV ANTIBODY (ROUTINE TESTING W REFLEX): HIV SCREEN 4TH GENERATION: NONREACTIVE

## 2016-09-14 LAB — C3 COMPLEMENT: C3 Complement: 105 mg/dL (ref 82–167)

## 2016-09-14 LAB — C4 COMPLEMENT: Complement C4, Body Fluid: 30 mg/dL (ref 14–44)

## 2016-09-14 LAB — HEPATITIS B SURFACE ANTIGEN: Hepatitis B Surface Ag: NEGATIVE

## 2016-09-14 LAB — HCV COMMENT:

## 2016-09-14 LAB — PHOSPHORUS: PHOSPHORUS: 10.3 mg/dL — AB (ref 2.5–4.6)

## 2016-09-14 LAB — MAGNESIUM: MAGNESIUM: 3 mg/dL — AB (ref 1.7–2.4)

## 2016-09-14 LAB — HEPATITIS C ANTIBODY (REFLEX): HCV Ab: <0.1 s/co ratio (ref 0.0–0.9)

## 2016-09-14 MED ORDER — LABETALOL HCL 200 MG PO TABS
200.0000 mg | ORAL_TABLET | Freq: Two times a day (BID) | ORAL | Status: DC
  Administered 2016-09-14 – 2016-09-18 (×9): 200 mg via ORAL
  Filled 2016-09-14 (×9): qty 1

## 2016-09-14 MED ORDER — SODIUM BICARBONATE 650 MG PO TABS
650.0000 mg | ORAL_TABLET | Freq: Three times a day (TID) | ORAL | Status: DC
  Administered 2016-09-14 – 2016-09-17 (×8): 650 mg via ORAL
  Filled 2016-09-14 (×8): qty 1

## 2016-09-14 NOTE — Consult Note (Signed)
Asked by nephrology service to place catheter and permanent access.  Pt currently at [facility] and awaiting transfer.  Will plan to place catheter at [facility] tomorrow morning.  Dr Donnetta Hutching.  Will order vein mapping and plan on permanent access later this week.  Anemia.  Will need type and screen sent.  Transfusion per Nephrology service.  NPO p midnight, consent Full consult regarding permanent access after pt arrives at [place], MD [facility] Office: [phone] Pager: [phone]

## 2016-09-14 NOTE — Progress Notes (Signed)
PULMONARY / CRITICAL CARE MEDICINE   Name: Melody [name] MRN: [id number] DOB: 1975/12/24    ADMISSION DATE:  09/12/2016 CONSULTATION DATE:  09/12/2016  REFERRING MD:  EDP  CHIEF COMPLAINT:  Hypertensive emergency  HISTORY OF PRESENT ILLNESS:   Melody [name] is a 41 y/o woman with pmh significant for HTN on amolodipine who presented to the [facility] with a 1 week history of headache, abdominal pain and occasional chest pain. She also reports some orthopnea.  She was found to have a systolic BP of ~299 and Cr of 16.  She last had her BP and labs checked in July 2017 at which time her BP was 150/100 and Cr was 1.2.  She has not noticed any decrease in urine output or change in urine characteristics.  She has not had any lower extremity edema.  She has had some shortness of breath when lying down over the last week.  She has a strong family history of hypertension and renal failure.  She smokes marijuana regularly.  She previously smoked about 1/2 ppd of cigarettes but has cut down to 3/day.  She does not regularly consume alcohol.   SUBJECTIVE:  No issues overnight. Comfortable. Complaining of abd pain this am. Labetalol drip being tapered off (reason why she is in ICU).   VITAL SIGNS: BP 123/84 (BP Location: Right Arm)   Pulse 67   Temp 97.8 F (36.6 C) (Oral)   Resp 14   Ht 5\' 6"  (1.676 m)   Wt 66.3 kg (146 lb 2.6 oz)   LMP 09/11/2016   SpO2 98%   BMI 23.59 kg/m   HEMODYNAMICS:     INTAKE / OUTPUT: I/O last 3 completed shifts: In: 946.3 [P.O.:360; I.V.:536.3; IV Piggyback:50] Out: 640 [Urine:640]  PHYSICAL EXAMINATION: General:  Well appearing woman laying in bed in no apparent distress.  Neuro:  Alert and oriented x 3 HEENT:  OP clear, PERRL, EOMI Cardiovascular:  NRRR, II/VI systolic murmur throughout precordium Lungs:  Good ae. Some crackles at bases. (-) wheezing/rhonchi.  Abdomen:  Soft NTND, no palpable HSM Musculoskeletal:  No edema Skin:  No rashes, bruises or  other lesions.   LABS:  BMET  Recent Labs Lab 09/12/16 2044 09/13/16 0204 09/14/16 0317  NA 132* 128* 129*  K 3.0* 3.2* 3.3*  CL 102 99* 97*  CO2 12* 13* 13*  BUN 98* 103* 122*  CREATININE 15.78* 16.05* 16.29*  GLUCOSE 119* 219* 129*    Electrolytes  Recent Labs Lab 09/12/16 2044 09/13/16 0204 09/14/16 0317  CALCIUM 8.7* 8.7* 8.4*  MG  --   --  3.0*  PHOS  --  8.2* 10.3*    CBC  Recent Labs Lab 09/12/16 2044 09/13/16 0204 09/14/16 0709  WBC 11.5* 6.8 9.0  HGB 7.9* 7.4* 6.9*  HCT 21.9* 20.5* 19.1*  PLT 100* 81* 130*    Coag's No results for input(s): APTT, INR in the last 168 hours.  Sepsis Markers  Recent Labs Lab 09/13/16 0204 09/13/16 0431  LATICACIDVEN 0.8 0.7  PROCALCITON 1.38  --     ABG No results for input(s): PHART, PCO2ART, PO2ART in the last 168 hours.  Liver Enzymes  Recent Labs Lab 09/13/16 0204  AST 17  ALT 17  ALKPHOS 42  BILITOT 0.6  ALBUMIN 3.3*    Cardiac Enzymes  Recent Labs Lab 09/13/16 0204 09/13/16 0731 09/13/16 1510  TROPONINI 0.33* 0.24* 0.19*    Glucose No results for input(s): GLUCAP in the last 168 hours.  Imaging  Dg Chest 1 View  Result Date: 09/13/2016 CLINICAL DATA:  Respiratory failure EXAM: CHEST 1 VIEW COMPARISON:  Chest radiograph from one day prior. FINDINGS: Stable cardiomediastinal silhouette with mild cardiomegaly. No pneumothorax. No pleural effusion. Bilateral lung opacities have nearly resolved, with minimal residual right parahilar fluffy opacity. No acute consolidative airspace disease. IMPRESSION: 1. Stable mild cardiomegaly. 2. Near complete resolution of bilateral lung opacities, consistent with nearly resolved pulmonary edema. Electronically Signed   By: Ilona Sorrel M.D.   On: 09/13/2016 09:23     STUDIES:  Echo 1/20 > EF 55-60%, mild-mod AI, mod MR, DD Renal US 1/20 > no hydronephrosis  CULTURES: MRSA 1/20 (-)  ANTIBIOTICS:   SIGNIFICANT EVENTS: 1/20 admit for HTN  emergency and AKI  LINES/TUBES:   DISCUSSION: 41 y/o woman with hypertensive emergency and acute renal failure  ASSESSMENT / PLAN:  PULMONARY A: Mild pulmonary edema. Improved.   P:   Keep O2 sats > 88-90%  CARDIOVASCULAR A:  Hypertensive emergency, better Diastolic Dysfxn Mild-mod AI, mod MR, possible RHD P:  Labetalol drip being weaned off. Once off drip, can transfer out of ICU Oral metoprolol 50mg  BID > may increase to q8 if needed Amlodipine 10mg  daily (home dose) hydralizine as needed for SBP>180 Will need Cards consult >> not urgent >> plan to call Cards on Monday.   RENAL A:   AKI 2/2 hypertensive emergency. Likely with CKD P:  Apprecaite Nephrology consult and recommendations.  I anticipate pt will need HD.  Remains oliguric and BUN and creat are elevated. Not sure if she will need a perm cath today or tomorrow or temp HD cath. Will await renal input today.  Renal u/s reassuring Avoid nephrotoxic agents Cont oral bicarb for metabolic acidosis Plan to d/c foley if she remains oliguric next 24 hrs.   GASTROINTESTINAL A:   Abdominal pain P:   Likely 2/2 hypertension Monitor   HEMATOLOGIC A:   Anemia P:  Likely 2/2 renal disease Will transfuse 1u pRBC during HD.     NEUROLOGIC A:   Headache, likely 2/2 HTN (+) Marijuana in UDS P:   Tylenol prn   I spent  30  minutes of Critical Care time with this patient today.   FAMILY  - Updates: Pt updated at bedside. No other family seen.   - Inter-disciplinary family meet or Palliative Care meeting due by:  1/26  J. Shirl Harris, MD 09/14/2016, 9:26 AM [place] [facility] Pager [phone] After 3 pm or if no answer, call [phone]

## 2016-09-14 NOTE — Progress Notes (Signed)
   [facility] PCCM  Spooke with Dr. Justin Mend.  Plan to transfer to [facility] to have vascular people see pt for permanent catheter placement  for HD access as well as initiation of HD.  As pt is still in ICU 2/2 cardene drip, pt will be transferred to ICU under PCCM service.   Once off drip, she can move to SDU or telemetry. Nurse updated.   Monica Becton, MD 09/14/2016, 11:16 AM [place] [facility] Pager [phone] After 3 pm or if no answer, call [phone]

## 2016-09-14 NOTE — Progress Notes (Signed)
Melody [name] ROUNDING NOTE   Subjective:   Interval History:  No complaints this am   Objective:  Vital signs in last 24 hours:  Temp:  [97.8 F (36.6 C)-98.3 F (36.8 C)] 97.8 F (36.6 C) (01/21 0800) Pulse Rate:  [67-81] 68 (01/21 1000) Resp:  [8-27] 14 (01/21 1000) BP: (123-159)/(78-112) 140/97 (01/21 1000) SpO2:  [96 %-100 %] 98 % (01/21 1000) Weight:  [66.3 kg (146 lb 2.6 oz)] 66.3 kg (146 lb 2.6 oz) (01/21 0600)  Weight change: 3.5 kg (7 lb 11.5 oz) Filed Weights   09/13/16 0121 09/14/16 0600  Weight: 62.8 kg (138 lb 7.2 oz) 66.3 kg (146 lb 2.6 oz)    Intake/Output: I/O last 3 completed shifts: In: 946.3 [P.O.:360; I.V.:536.3; IV Piggyback:50] Out: 640 [Urine:640]   Intake/Output this shift:  Total I/O In: -  Out: 45 [Urine:45]  General:  Well appearing woman laying in bed in no apparent distress.  Neuro:  Alert and oriented x 3 HEENT:  OP clear, PERRL, EOMI Cardiovascular:  NRRR, II/VI systolic murmur throughout precordium Lungs:  Good ae. Some crackles at bases. (-) wheezing/rhonchi.  Abdomen:  Soft NTND, no palpable HSM Musculoskeletal:  No edema Skin:  No rashes, bruises or other lesions.    Basic Metabolic Panel:  Recent Labs Lab 09/12/16 2044 09/13/16 0204 09/14/16 0317  NA 132* 128* 129*  K 3.0* 3.2* 3.3*  CL 102 99* 97*  CO2 12* 13* 13*  GLUCOSE 119* 219* 129*  BUN 98* 103* 122*  CREATININE 15.78* 16.05* 16.29*  CALCIUM 8.7* 8.7* 8.4*  MG  --   --  3.0*  PHOS  --  8.2* 10.3*    Liver Function Tests:  Recent Labs Lab 09/13/16 0204  AST 17  ALT 17  ALKPHOS 42  BILITOT 0.6  PROT 5.9*  ALBUMIN 3.3*   No results for input(s): LIPASE, AMYLASE in the last 168 hours. No results for input(s): AMMONIA in the last 168 hours.  CBC:  Recent Labs Lab 09/12/16 2044 09/13/16 0204 09/14/16 0709  WBC 11.5* 6.8 9.0  NEUTROABS  --   --  6.9  HGB 7.9* 7.4* 6.9*  HCT 21.9* 20.5* 19.1*  MCV 90.9 88.7 89.3  PLT 100* 81*  130*    Cardiac Enzymes:  Recent Labs Lab 09/13/16 0204 09/13/16 0731 09/13/16 1510  TROPONINI 0.33* 0.24* 0.19*    BNP: Invalid input(s): POCBNP  CBG: No results for input(s): GLUCAP in the last 168 hours.  Microbiology: Results for orders placed or performed during the hospital encounter of 09/12/16  MRSA PCR Screening     Status: None   Collection Time: 09/13/16 12:59 AM  Result Value Ref Range Status   MRSA by PCR NEGATIVE NEGATIVE Final    Comment:        The GeneXpert MRSA Assay (FDA approved for NASAL specimens only), is one component of a comprehensive MRSA colonization surveillance program. It is not intended to diagnose MRSA infection nor to guide or monitor treatment for MRSA infections.     Coagulation Studies: No results for input(s): LABPROT, INR in the last 72 hours.  Urinalysis: No results for input(s): COLORURINE, LABSPEC, PHURINE, GLUCOSEU, HGBUR, BILIRUBINUR, KETONESUR, PROTEINUR, UROBILINOGEN, NITRITE, LEUKOCYTESUR in the last 72 hours.  Invalid input(s): APPERANCEUR    Imaging: Dg Chest 1 View  Result Date: 09/13/2016 CLINICAL DATA:  Respiratory failure EXAM: CHEST 1 VIEW COMPARISON:  Chest radiograph from one day prior. FINDINGS: Stable cardiomediastinal silhouette with mild cardiomegaly. No pneumothorax. No  pleural effusion. Bilateral lung opacities have nearly resolved, with minimal residual right parahilar fluffy opacity. No acute consolidative airspace disease. IMPRESSION: 1. Stable mild cardiomegaly. 2. Near complete resolution of bilateral lung opacities, consistent with nearly resolved pulmonary edema. Electronically Signed   By: Ilona Sorrel M.D.   On: 09/13/2016 09:23   Dg Chest 2 View  Result Date: 09/12/2016 CLINICAL DATA:  Body ache, fevers, chest pain and cough. Dyspnea times 3-4 days. EXAM: CHEST  2 VIEW COMPARISON:  12/28/2012 FINDINGS: Cardiomediastinal contours are normal. Patchy airspace opacities consistent with pneumonia  in the right upper, middle and lower lobes. No effusion or pneumothorax. No acute osseous abnormality. IMPRESSION: Patchy airspace disease in involving the right lung consistent multilobar pneumonia. Electronically Signed   By: Ashley Royalty M.D.   On: 09/12/2016 12:33   US Renal  Result Date: 09/13/2016 CLINICAL DATA:  Acute renal failure. EXAM: RENAL / URINARY TRACT ULTRASOUND COMPLETE COMPARISON:  01/15/2015 renal sonogram. FINDINGS: Right Kidney: Length: 9.7 cm. Echogenic right kidney. No right hydronephrosis. No right renal mass. Left Kidney: Length: 9.4 cm. Echogenic left kidney. No left hydronephrosis. No left renal mass. Bladder: Appears normal for degree of bladder distention. Bilateral ureteral jets are demonstrated in the bladder. IMPRESSION: 1. No hydronephrosis. 2. Echogenic normal size kidneys bilaterally, indicating renal parenchymal disease of uncertain chronicity. 3. Normal bladder. Electronically Signed   By: Ilona Sorrel M.D.   On: 09/13/2016 09:20     Medications:   . labetalol (NORMODYNE) infusion 0.5 mg/min (09/14/16 0700)  . nitroGLYCERIN Stopped (09/13/16 1300)   . amLODipine  10 mg Oral Daily  . heparin  5,000 Units Subcutaneous Q8H  . metoprolol tartrate  50 mg Oral BID  . nicotine  7 mg Transdermal Daily  . sodium bicarbonate  650 mg Oral BID  . sodium chloride flush  3 mL Intravenous Q12H   sodium chloride, sodium chloride, acetaminophen, HYDROmorphone (DILAUDID) injection, sodium chloride flush  Assessment/ Plan:  Renal failure associated with malignant hypertension  From previously uncontrolled blood pressure. I think it reasonable to check some serological studies in this lady although doubt that these will be positive. I do not think it likely that she will recover function and will most likely need dialysis   Hypertension  Appears better controlled on IV nitro and IV labetalol  Transition to oral labetalol  Anemia  Stable  Thrombocytopenia  Secondary to  Malignant Hypertension    LOS: 2 Melody [name] W @TODAY @11 :04 AM

## 2016-09-14 NOTE — Progress Notes (Signed)
Full report called to receiving nurse on [place] at [facility] hospital. Patient's vital signs are currently stable, the patient is not in pain nor is the patient showing any signs of acute distress. [facility] to be notified that the patient is ready for transfer. Will continue to follow up as needed.

## 2016-09-14 NOTE — Progress Notes (Signed)
CRITICAL VALUE ALERT  Critical value received: Hgb 6.9  Date of notification: 1/21[2018  Time of notification:  0830  Critical value read back:yes Nurse who received alert:  Lonie Peak MD notified (1st page):Dr deDios Time of first page:  Present on unit 0840  MD notified (2nd page):  Time of second page:  Responding MD:    Time MD responded:

## 2016-09-14 NOTE — Progress Notes (Signed)
Initial Nutrition Assessment  INTERVENTION:   Encourage PO intake RD will continue to monitor for needs  NUTRITION DIAGNOSIS:   Inadequate oral intake related to poor appetite as evidenced by per patient/family report.  GOAL:   Patient will meet greater than or equal to 90% of their needs  MONITOR:   PO intake, Labs, Weight trends, I & O's  REASON FOR ASSESSMENT:   Malnutrition Screening Tool    ASSESSMENT:    41 y/o woman with pmh significant for HTN on amolodipine who presented to the [facility] with a 1 week history of headache, abdominal pain and occasional chest pain. She also reports some orthopnea.  She was found to have a systolic BP of ~226 and Cr of 16.  She last had her BP and labs checked in July 2017 at which time her BP was 150/100 and Cr was 1.2.  She has not noticed any decrease in urine output or change in urine characteristics.  She has not had any lower extremity edema.  She has had some shortness of breath when lying down over the last week.  She has a strong family history of hypertension and renal failure.   Patient in room with no family at bedside. Pt reports poor appetite and intake for 1 week PTA. Pt states she would eat maybe once a day and it would be anything her mother brought for her to eat like grits or macaroni and cheese. Pt ate 30% of her breakfast this morning.   Per nephrology note, the plan is for the patient to transfer to [facility] for HD.   Pt states her UBW is 155 lb. At admission pt weighed 138 lb. Suspect weight fluctuations are related to fluid. Will monitor weight trends. Nutrition focused physical exam shows no sign of depletion of muscle mass or body fat.  Medications reviewed. Labs reviewed: Low Na, K Elevated Phos, Mg GFR: 3  Diet Order:  Diet renal with fluid restriction Fluid restriction: 1200 mL Fluid; Room service appropriate? Yes; Fluid consistency: Thin  Skin:  Reviewed, no issues  Last BM:  PTA  Height:   Ht Readings from  Last 1 Encounters:  09/13/16 5\' 6"  (1.676 m)    Weight:   Wt Readings from Last 1 Encounters:  09/14/16 146 lb 2.6 oz (66.3 kg)    Ideal Body Weight:  59.1 kg  BMI:  Body mass index is 23.59 kg/m.  Estimated Nutritional Needs:   Kcal:  1650-1850  Protein:  75-85g  Fluid:  Per MD  EDUCATION NEEDS:   No education needs identified at this time  Clayton Bibles, MS, RD, LDN Pager: [phone] After Hours Pager: [phone]

## 2016-09-15 ENCOUNTER — Inpatient Hospital Stay (HOSPITAL_COMMUNITY): Payer: Medicaid Other | Admitting: Certified Registered Nurse Anesthetist

## 2016-09-15 ENCOUNTER — Inpatient Hospital Stay (HOSPITAL_COMMUNITY): Payer: Medicaid Other

## 2016-09-15 ENCOUNTER — Encounter (HOSPITAL_COMMUNITY)
Admission: EM | Disposition: A | Discharge status: Home / Self Care | Payer: Self-pay | Source: Home / Self Care | Attending: Pulmonary Disease

## 2016-09-15 DIAGNOSIS — E876 Hypokalemia: Secondary | ICD-10-CM

## 2016-09-15 DIAGNOSIS — N185 Chronic kidney disease, stage 5: Secondary | ICD-10-CM

## 2016-09-15 HISTORY — PX: AV FISTULA PLACEMENT: SHX1204

## 2016-09-15 HISTORY — PX: INSERTION OF DIALYSIS CATHETER: SHX1324

## 2016-09-15 LAB — CBC WITH DIFFERENTIAL/PLATELET
BASOS ABS: 0 10*3/uL (ref 0.0–0.1)
Basophils Relative: 0 %
Eosinophils Absolute: 0.1 10*3/uL (ref 0.0–0.7)
Eosinophils Relative: 1 %
HEMATOCRIT: 19.8 % — AB (ref 36.0–46.0)
Hemoglobin: 7.1 g/dL — ABNORMAL LOW (ref 12.0–15.0)
LYMPHS PCT: 27 %
Lymphs Abs: 2.1 10*3/uL (ref 0.7–4.0)
MCH: 32.7 pg (ref 26.0–34.0)
MCHC: 35.9 g/dL (ref 30.0–36.0)
MCV: 91.2 fL (ref 78.0–100.0)
Monocytes Absolute: 0.2 10*3/uL (ref 0.1–1.0)
Monocytes Relative: 3 %
NEUTROS ABS: 5.5 10*3/uL (ref 1.7–7.7)
Neutrophils Relative %: 69 %
Platelets: 171 10*3/uL (ref 150–400)
RBC: 2.17 MIL/uL — AB (ref 3.87–5.11)
RDW: 14 % (ref 11.5–15.5)
WBC: 7.9 10*3/uL (ref 4.0–10.5)

## 2016-09-15 LAB — BASIC METABOLIC PANEL
ANION GAP: 20 — AB (ref 5–15)
BUN: 126 mg/dL — ABNORMAL HIGH (ref 6–20)
CHLORIDE: 96 mmol/L — AB (ref 101–111)
CO2: 14 mmol/L — ABNORMAL LOW (ref 22–32)
Calcium: 8.4 mg/dL — ABNORMAL LOW (ref 8.9–10.3)
Creatinine, Ser: 18.36 mg/dL — ABNORMAL HIGH (ref 0.44–1.00)
GFR calc Af Amer: 2 mL/min — ABNORMAL LOW (ref >60)
GFR calc non Af Amer: 2 mL/min — ABNORMAL LOW (ref >60)
GLUCOSE: 106 mg/dL — AB (ref 65–99)
POTASSIUM: 3.2 mmol/L — AB (ref 3.5–5.1)
Sodium: 130 mmol/L — ABNORMAL LOW (ref 135–145)

## 2016-09-15 LAB — TYPE AND SCREEN
ABO/RH(D): A POS
ANTIBODY SCREEN: NEGATIVE

## 2016-09-15 LAB — ANTINUCLEAR ANTIBODIES, IFA: ANTINUCLEAR ANTIBODIES, IFA: NEGATIVE

## 2016-09-15 LAB — PROTEIN ELECTROPHORESIS, SERUM
A/G Ratio: 1.1 (ref 0.7–1.7)
ALPHA-1-GLOBULIN: 0.4 g/dL (ref 0.0–0.4)
Albumin ELP: 2.7 g/dL — ABNORMAL LOW (ref 2.9–4.4)
Alpha-2-Globulin: 0.5 g/dL (ref 0.4–1.0)
Beta Globulin: 0.8 g/dL (ref 0.7–1.3)
Gamma Globulin: 0.6 g/dL (ref 0.4–1.8)
Globulin, Total: 2.4 g/dL (ref 2.2–3.9)
PDF: 0
Total Protein ELP: 5.1 g/dL — ABNORMAL LOW (ref 6.0–8.5)

## 2016-09-15 LAB — MPO/PR-3 (ANCA) ANTIBODIES: ANCA Proteinase 3: <3.5 U/mL (ref 0.0–3.5)

## 2016-09-15 LAB — ABO/RH: ABO/RH(D): A POS

## 2016-09-15 LAB — IMMUNOFIXATION ELECTROPHORESIS
IGA: 107 mg/dL (ref 87–352)
IGG (IMMUNOGLOBIN G), SERUM: 658 mg/dL — AB (ref 700–1600)
IGM, SERUM: 23 mg/dL — AB (ref 26–217)
TOTAL PROTEIN ELP: 5.2 g/dL — AB (ref 6.0–8.5)

## 2016-09-15 LAB — MAGNESIUM: Magnesium: 3 mg/dL — ABNORMAL HIGH (ref 1.7–2.4)

## 2016-09-15 LAB — PHOSPHORUS: Phosphorus: 10.6 mg/dL — ABNORMAL HIGH (ref 2.5–4.6)

## 2016-09-15 LAB — ANTI-DNA ANTIBODY, DOUBLE-STRANDED

## 2016-09-15 LAB — GLOMERULAR BASEMENT MEMBRANE ANTIBODIES: GBM AB: 4 U (ref 0–20)

## 2016-09-15 SURGERY — INSERTION OF DIALYSIS CATHETER
Anesthesia: General | Site: Neck | Laterality: Right

## 2016-09-15 MED ORDER — DARBEPOETIN ALFA 40 MCG/0.4ML IJ SOSY
40.0000 ug | PREFILLED_SYRINGE | INTRAMUSCULAR | Status: DC
  Administered 2016-09-16: 40 ug via INTRAVENOUS
  Filled 2016-09-15: qty 0.4

## 2016-09-15 MED ORDER — FENTANYL CITRATE (PF) 100 MCG/2ML IJ SOLN
INTRAMUSCULAR | Status: DC | PRN
  Administered 2016-09-15 (×2): 50 ug via INTRAVENOUS
  Administered 2016-09-15: 25 ug via INTRAVENOUS
  Administered 2016-09-15: 50 ug via INTRAVENOUS
  Administered 2016-09-15: 25 ug via INTRAVENOUS

## 2016-09-15 MED ORDER — LIDOCAINE HCL (PF) 1 % IJ SOLN
INTRAMUSCULAR | Status: AC
  Filled 2016-09-15: qty 30

## 2016-09-15 MED ORDER — MIDAZOLAM HCL 5 MG/5ML IJ SOLN
INTRAMUSCULAR | Status: DC | PRN
  Administered 2016-09-15: 1 mg via INTRAVENOUS

## 2016-09-15 MED ORDER — FENTANYL CITRATE (PF) 100 MCG/2ML IJ SOLN: 25.0000 ug | INTRAMUSCULAR | Status: DC | PRN

## 2016-09-15 MED ORDER — LIDOCAINE-EPINEPHRINE (PF) 1 %-1:200000 IJ SOLN
INTRAMUSCULAR | Status: AC
  Filled 2016-09-15: qty 30

## 2016-09-15 MED ORDER — HEPARIN SODIUM (PORCINE) 5000 UNIT/ML IJ SOLN
INTRAMUSCULAR | Status: DC | PRN
  Administered 2016-09-15: 12:00:00

## 2016-09-15 MED ORDER — PHENYLEPHRINE HCL 10 MG/ML IJ SOLN
INTRAMUSCULAR | Status: DC | PRN
  Administered 2016-09-15: 120 ug via INTRAVENOUS
  Administered 2016-09-15: 80 ug via INTRAVENOUS

## 2016-09-15 MED ORDER — HEPARIN SODIUM (PORCINE) 1000 UNIT/ML IJ SOLN
INTRAMUSCULAR | Status: DC | PRN
  Administered 2016-09-15: 3.4 mL

## 2016-09-15 MED ORDER — IOPAMIDOL (ISOVUE-300) INJECTION 61%
INTRAVENOUS | Status: AC
  Filled 2016-09-15: qty 50

## 2016-09-15 MED ORDER — HEPARIN SODIUM (PORCINE) 1000 UNIT/ML IJ SOLN
INTRAMUSCULAR | Status: AC
  Filled 2016-09-15: qty 1

## 2016-09-15 MED ORDER — HYDROMORPHONE HCL 1 MG/ML IJ SOLN
INTRAMUSCULAR | Status: AC
  Administered 2016-09-15: 0.5 mg via INTRAVENOUS
  Filled 2016-09-15: qty 1

## 2016-09-15 MED ORDER — PROPOFOL 10 MG/ML IV BOLUS
INTRAVENOUS | Status: DC | PRN
  Administered 2016-09-15: 200 mg via INTRAVENOUS

## 2016-09-15 MED ORDER — HYDROMORPHONE HCL 1 MG/ML IJ SOLN
INTRAMUSCULAR | Status: AC
  Administered 2016-09-15: 0.5 mg
  Filled 2016-09-15: qty 1

## 2016-09-15 MED ORDER — WHITE PETROLATUM GEL
Status: DC | PRN
  Filled 2016-09-15 (×2): qty 28.35

## 2016-09-15 MED ORDER — SODIUM CHLORIDE 0.9 % IV SOLN
INTRAVENOUS | Status: DC
Start: 2016-09-15 — End: 2016-09-17
  Administered 2016-09-15: 10 mL/h via INTRAVENOUS
  Administered 2016-09-15: 14:00:00 via INTRAVENOUS

## 2016-09-15 MED ORDER — MIDAZOLAM HCL 2 MG/2ML IJ SOLN
INTRAMUSCULAR | Status: AC
  Filled 2016-09-15: qty 2

## 2016-09-15 MED ORDER — FENTANYL CITRATE (PF) 100 MCG/2ML IJ SOLN
INTRAMUSCULAR | Status: AC
  Filled 2016-09-15: qty 2

## 2016-09-15 MED ORDER — 0.9 % SODIUM CHLORIDE (POUR BTL) OPTIME
TOPICAL | Status: DC | PRN
  Administered 2016-09-15: 1000 mL

## 2016-09-15 MED ORDER — CEFAZOLIN SODIUM 1 G IJ SOLR
INTRAMUSCULAR | Status: DC | PRN
  Administered 2016-09-15: 1 g via INTRAMUSCULAR

## 2016-09-15 MED ORDER — PROMETHAZINE HCL 25 MG/ML IJ SOLN: 6.2500 mg | INTRAMUSCULAR | Status: DC | PRN

## 2016-09-15 MED ORDER — CALCIUM ACETATE (PHOS BINDER) 667 MG PO CAPS
1334.0000 mg | ORAL_CAPSULE | Freq: Three times a day (TID) | ORAL | Status: DC
  Administered 2016-09-16 – 2016-09-19 (×7): 1334 mg via ORAL
  Filled 2016-09-15 (×8): qty 2

## 2016-09-15 MED ORDER — WHITE PETROLATUM GEL
Status: DC | PRN
  Filled 2016-09-15: qty 28.35

## 2016-09-15 MED ORDER — PHENYLEPHRINE HCL 10 MG/ML IJ SOLN
INTRAVENOUS | Status: DC | PRN
  Administered 2016-09-15: 30 ug/min via INTRAVENOUS

## 2016-09-15 MED ORDER — RENA-VITE PO TABS
1.0000 | ORAL_TABLET | Freq: Every day | ORAL | Status: DC
  Administered 2016-09-16 – 2016-09-18 (×3): 1 via ORAL
  Filled 2016-09-15 (×3): qty 1

## 2016-09-15 MED ORDER — LIDOCAINE HCL (CARDIAC) 20 MG/ML IV SOLN
INTRAVENOUS | Status: DC | PRN
  Administered 2016-09-15: 60 mg via INTRAVENOUS

## 2016-09-15 SURGICAL SUPPLY — 55 items
ADH SKN CLS APL DERMABOND .7 (GAUZE/BANDAGES/DRESSINGS) ×2
APL SKNCLS STERI-STRIP NONHPOA (GAUZE/BANDAGES/DRESSINGS) ×2
BAG DECANTER FOR FLEXI CONT (MISCELLANEOUS) ×3 IMPLANT
BENZOIN TINCTURE PRP APPL 2/3 (GAUZE/BANDAGES/DRESSINGS) ×1 IMPLANT
BIOPATCH RED 1 DISK 7.0 (GAUZE/BANDAGES/DRESSINGS) ×3 IMPLANT
CATH PALINDROME RT-P 15FX19CM (CATHETERS) IMPLANT
CATH PALINDROME RT-P 15FX23CM (CATHETERS) ×3 IMPLANT
CATH PALINDROME RT-P 15FX28CM (CATHETERS) IMPLANT
CATH PALINDROME RT-P 15FX55CM (CATHETERS) IMPLANT
CLIP LIGATING EXTRA MED SLVR (CLIP) ×1 IMPLANT
CLIP LIGATING EXTRA SM BLUE (MISCELLANEOUS) ×1 IMPLANT
CLSR STERI-STRIP ANTIMIC 1/2X4 (GAUZE/BANDAGES/DRESSINGS) ×3 IMPLANT
COVER PROBE W GEL 5X96 (DRAPES) IMPLANT
COVER SURGICAL LIGHT HANDLE (MISCELLANEOUS) ×3 IMPLANT
DECANTER SPIKE VIAL GLASS SM (MISCELLANEOUS) ×3 IMPLANT
DERMABOND ADVANCED (GAUZE/BANDAGES/DRESSINGS) ×1
DERMABOND ADVANCED .7 DNX12 (GAUZE/BANDAGES/DRESSINGS) IMPLANT
DRAPE C-ARM 42X72 X-RAY (DRAPES) ×3 IMPLANT
DRAPE CHEST BREAST 15X10 FENES (DRAPES) ×3 IMPLANT
GAUZE SPONGE 4X4 12PLY STRL (GAUZE/BANDAGES/DRESSINGS) ×2 IMPLANT
GAUZE SPONGE 4X4 16PLY XRAY LF (GAUZE/BANDAGES/DRESSINGS) ×1 IMPLANT
GLOVE BIOGEL PI IND STRL 6.5 (GLOVE) ×2 IMPLANT
GLOVE BIOGEL PI IND STRL 7.0 (GLOVE) IMPLANT
GLOVE BIOGEL PI IND STRL 7.5 (GLOVE) IMPLANT
GLOVE BIOGEL PI INDICATOR 6.5 (GLOVE) ×1
GLOVE BIOGEL PI INDICATOR 7.0 (GLOVE) ×2
GLOVE BIOGEL PI INDICATOR 7.5 (GLOVE) ×1
GLOVE ECLIPSE 6.5 STRL STRAW (GLOVE) ×2 IMPLANT
GLOVE SS BIOGEL STRL SZ 7.5 (GLOVE) ×3 IMPLANT
GLOVE SUPERSENSE BIOGEL SZ 7.5 (GLOVE) ×2
GOWN STRL REUS W/ TWL LRG LVL3 (GOWN DISPOSABLE) ×4 IMPLANT
GOWN STRL REUS W/TWL LRG LVL3 (GOWN DISPOSABLE) ×6
KIT BASIN OR (CUSTOM PROCEDURE TRAY) ×3 IMPLANT
KIT ROOM TURNOVER OR (KITS) ×3 IMPLANT
NDL 18GX1X1/2 (RX/OR ONLY) (NEEDLE) ×2 IMPLANT
NDL HYPO 25GX1X1/2 BEV (NEEDLE) ×2 IMPLANT
NEEDLE 18GX1X1/2 (RX/OR ONLY) (NEEDLE) ×3 IMPLANT
NEEDLE 22X1 1/2 (OR ONLY) (NEEDLE) ×1 IMPLANT
NEEDLE HYPO 25GX1X1/2 BEV (NEEDLE) ×3 IMPLANT
NS IRRIG 1000ML POUR BTL (IV SOLUTION) ×3 IMPLANT
PACK CV ACCESS (CUSTOM PROCEDURE TRAY) ×2 IMPLANT
PACK SURGICAL SETUP 50X90 (CUSTOM PROCEDURE TRAY) ×3 IMPLANT
PAD ARMBOARD 7.5X6 YLW CONV (MISCELLANEOUS) ×6 IMPLANT
SOAP 2 % CHG 4 OZ (WOUND CARE) ×3 IMPLANT
SUT ETHILON 3 0 PS 1 (SUTURE) ×3 IMPLANT
SUT PROLENE 6 0 CC (SUTURE) ×1 IMPLANT
SUT VICRYL 4-0 PS2 18IN ABS (SUTURE) ×3 IMPLANT
SYR 20CC LL (SYRINGE) ×5 IMPLANT
SYR 5ML LL (SYRINGE) ×8 IMPLANT
SYR CONTROL 10ML LL (SYRINGE) ×3 IMPLANT
SYRINGE 10CC LL (SYRINGE) ×3 IMPLANT
TAPE STRIPS DRAPE STRL (GAUZE/BANDAGES/DRESSINGS) ×1 IMPLANT
TOWEL OR 17X24 6PK STRL BLUE (TOWEL DISPOSABLE) ×3 IMPLANT
TOWEL OR 17X26 10 PK STRL BLUE (TOWEL DISPOSABLE) ×3 IMPLANT
WATER STERILE IRR 1000ML POUR (IV SOLUTION) ×3 IMPLANT

## 2016-09-15 NOTE — Anesthesia Preprocedure Evaluation (Addendum)
Anesthesia Evaluation  Patient identified by MRN, date of birth, ID band Patient awake    Reviewed: Allergy & Precautions, NPO status , Patient's Chart, lab work & pertinent test results, reviewed documented beta blocker date and time   Airway Mallampati: II  TM Distance: >3 FB Neck ROM: Full    Dental  (+) Dental Advisory Given   Pulmonary Current Smoker,    breath sounds clear to auscultation       Cardiovascular hypertension, Pt. on medications and Pt. on home beta blockers  Rhythm:Regular Rate:Normal     Neuro/Psych negative neurological ROS     GI/Hepatic negative GI ROS, Neg liver ROS,   Endo/Other  negative endocrine ROS  Renal/GU ARFRenal disease     Musculoskeletal   Abdominal   Peds  Hematology  (+) anemia ,   Anesthesia Other Findings   Reproductive/Obstetrics                            Anesthesia Physical Anesthesia Plan  ASA: III  Anesthesia Plan: General   Post-op Pain Management:    Induction: Intravenous  Airway Management Planned: LMA  Additional Equipment:   Intra-op Plan:   Post-operative Plan: Extubation in OR  Informed Consent: I have reviewed the patients History and Physical, chart, labs and discussed the procedure including the risks, benefits and alternatives for the proposed anesthesia with the patient or authorized representative who has indicated his/her understanding and acceptance.   Dental advisory given  Plan Discussed with: CRNA  Anesthesia Plan Comments:         Anesthesia Quick Evaluation

## 2016-09-15 NOTE — Op Note (Signed)
OPERATIVE REPORT  DATE OF SURGERY: 09/15/2016  PATIENT: Melody [name], 41 y.o. female MRN: [id number]  DOB: 02-07-1976  PRE-OPERATIVE DIAGNOSIS: End-stage renal disease  POST-OPERATIVE DIAGNOSIS:  Same  PROCEDURE: #1 right IJ hemodialysis catheter, #2 right radiocephalic fistula  SURGEON:  Curt Jews, M.D.  PHYSICIAN ASSISTANT: Wendall Papa PA-C  ANESTHESIA:  Gen.  EBL: Minimal ml  Total I/O In: 600 [I.V.:600] Out: 100 [Blood:100]  BLOOD ADMINISTERED: None  DRAINS: None  SPECIMEN: None  COUNTS CORRECT:  YES  PLAN OF CARE: PACU with chest x-ray pending   PATIENT DISPOSITION:  PACU - hemodynamically stable  PROCEDURE DETAILS: Patient presents for placement of a tunneled catheter for hemodialysis for urgent hemodialysis needs. I discussed options with the patient. I did recommend proceeding with fistula creation pain with this during the same anesthesia and she understood and was willing.  Patient was placed supine position the area of the right and left neck were imaged with SonoSite ultrasound widely patent jugular veins. Patient's of right left neck and chest prepped and draped in sterile fashion. The patient was placed in Trendelenburg position and an 18-gauge needle was used to access the internal jugular vein on the right. Guidewire was passed through this down to the level of the right atrium and this was confirmed with fluoroscopy. A dilator and peel-away sheath was passed over the guidewire and the dilator and guidewire removed. A 23 cm tunneled catheter was passed through the peel-away sheath and the peel-away sheath was removed. The catheter was positioned the level of distal right atrium. Catheter was then brought through a subcutaneous tunnel through a separate stab incision in the 2 lm ports were attached. Both lumens flushed and aspirated easily and were locked with 1000 unit per cc heparin. The catheter was secured to the skin with a 3-0 nylon stitch and  the undersized closed with a 4 septic or Vicryl suture. Sterile dressing was applied. The right arm was imaged with SonoSite ultrasound. This did show patency of the cephalic vein throughout the forearm. The cephalic vein became small in the upper arm. The basilic vein was a better caliber in the upper arm on the right. Incision was made to proceed with a radiocephalic fistula. There was some branching throughout the cephalic vein in the forearm. Made between the level of the cephalic vein and the radial artery at the wrist. The artery was of good caliber with no atherosclerotic change noted. The vein branches just above this level and was of moderate size. The vein was mobilized and was ligated distally Strattera branches were ligated with 3 or 4 silk ties and divided. The vein was gently dilated with heparinized saline was felt to be adequate for attempted. A 2-1/2 dilator passed through the vein without resistance. The radial artery was occluded proximal and distally was opened with 11 blade and extended longitudinally with Potts scissors. The vein was cut to appropriate length and was spatulated and sewn end-to-side to the artery with a running 6-0 Prolene suture. To have dilator again passed through the anastomosis both the radial artery and the cephalic vein prior to closure. The anastomosis completed and clamps removed with good thrill noted in the vein. The patient was wound was irrigated with saline and hemostasis tablet cautery. Wound was closed with 3-0 Vicryl in the subcutaneous and subcuticular tissue. Benzoin insertion for applied and the patient was transferred to the recovery room in stable condition   Rosetta Posner, M.D., [facility] 09/15/2016 2:03 PM

## 2016-09-15 NOTE — Progress Notes (Signed)
Called report to [employer] on 2W. Patient currently in HD and will be transferred to 2W after HD is done. Belongings are being carried to 2W23 and patient is aware.  Levon Hedger, RN

## 2016-09-15 NOTE — Progress Notes (Signed)
Pt. Arrived to Short Stay with a pair of earrings. Contacted the nursing unit where pt. Came from and they sent a nursing tech. down to take it back to pt's room.

## 2016-09-15 NOTE — Interval H&P Note (Signed)
History and Physical Interval Note:  09/15/2016 10:53 AM  Melody [name]  has presented today for surgery, with the diagnosis of ESRD  The various methods of treatment have been discussed with the patient and family. After consideration of risks, benefits and other options for treatment, the patient has consented to  Procedure(s): INSERTION OF DIALYSIS CATHETER (N/A) as a surgical intervention .  The patient's history has been reviewed, patient examined, no change in status, stable for surgery.  I have reviewed the patient's chart and labs.  Questions were answered to the patient's satisfaction.     Curt Jews

## 2016-09-15 NOTE — Transfer of Care (Signed)
Immediate Anesthesia Transfer of Care Note  Patient: Melody [name]  Procedure(s) Performed: Procedure(s): INSERTION OF DIALYSIS CATHETER ON RIGHT SIDE (Right) ARTERIOVENOUS (AV) FISTULA CREATION UPPER RIGHT ARM (Right)  Patient Location: PACU  Anesthesia Type:General  Level of Consciousness: awake, alert , oriented and patient cooperative  Airway & Oxygen Therapy: Patient Spontanous Breathing and Patient connected to nasal cannula oxygen  Post-op Assessment: Report given to RN and Post -op Vital signs reviewed and stable  Post vital signs: Reviewed and stable  Last Vitals:  Vitals:   09/15/16 0800 09/15/16 0830  BP: (!) 144/108 (!) 146/107  Pulse: 85 76  Resp: 11 17  Temp:      Last Pain:  Vitals:   09/15/16 0700  TempSrc: Oral  PainSc:       Patients Stated Pain Goal: 0 (72/90/21 1155)  Complications: No apparent anesthesia complications

## 2016-09-15 NOTE — Progress Notes (Signed)
   RN says bladder scan showed 185cc urine in bladder  Patient is HD patient  Plan Bladder scan q shift x 2 days  Dr. Brand Males, M.D., [facility].C.P Pulmonary and Critical Care Medicine Staff Physician [place] [facility] Pager: [phone], If no answer or between  15:00h - 7:00h: call [phone]  09/15/2016 8:59 PM

## 2016-09-15 NOTE — Progress Notes (Signed)
Pt consent obtained for insertion of dialysis catheter on 09/15/16 and all questions answered. Blood consent also obtained.

## 2016-09-15 NOTE — Progress Notes (Signed)
PCCM Attending Note: Seen again post tunneled hemodialysis access placement. Currently  only slightly hypertensive. no postprocedural pneumothorax per radiology report. Patient is awake and sister at bedside. Sister inquiring about related donor renal transplant. Explained this could be investigated as an outpatient. Preliminary nephrology note reviewed from today. Plan for hemodialysis today. Transferring patient to medical telemetry bed. TRH to assume care & PCCM off as of 1/23.   Sonia Baller Ashok Cordia, M.D. [facility] Pulmonary & Critical Care Pager:  [phone] After 3pm or if no response, call [phone] 4:30 PM 09/15/16

## 2016-09-15 NOTE — Progress Notes (Signed)
PULMONARY / CRITICAL CARE MEDICINE   Name: Melody [name] MRN: [id number] DOB: 05-07-1976    ADMISSION DATE:  09/12/2016 CONSULTATION DATE:  09/12/2016  REFERRING MD:  EDP  CHIEF COMPLAINT:  Hypertensive Emergency  HISTORY OF PRESENT ILLNESS:  41 y.o. woman with pmh significant for HTN on amolodipine who presented to the [facility] with a 1 week history of headache, abdominal pain and occasional chest pain. She also reports some orthopnea.  She was found to have a systolic BP of ~130 and Cr of 16.  She last had her BP and labs checked in July 2017 at which time her BP was 150/100 and Cr was 1.2.  She has not noticed any decrease in urine output or change in urine characteristics.  She has not had any lower extremity edema.  She has had some shortness of breath when lying down over the last week.  She has a strong family history of hypertension and renal failure.  She smokes marijuana regularly.  She previously smoked about 1/2 ppd of cigarettes but has cut down to 3/day.  She does not regularly consume alcohol.   SUBJECTIVE:  Patient weaned off of fentanyl drip on 1/21 & nitroglycerin drip on 1/20. Patient denies any chest pain or tightness but does admit to some orthopnea. No other dyspnea otherwise. No cough.  REVIEW OF SYSTEMS: No abdominal pain, nausea, or vomiting. No subjective fever or chills. No headache or vision changes.  VITAL SIGNS: BP (!) 146/102   Pulse 75   Temp 98 F (36.7 C) (Oral)   Resp 16   Ht 5\' 6"  (1.676 m)   Wt 145 lb 8.1 oz (66 kg)   LMP 09/11/2016   SpO2 98%   BMI 23.48 kg/m   INTAKE / OUTPUT: I/O last 3 completed shifts: In: 738.6 [P.O.:600; I.V.:138.6] Out: 197 [Urine:197]  PHYSICAL EXAMINATION: General:  Awake. Alert. No acute distress. No family at bedside.  Integument:  Warm & dry. No rash on exposed skin.  Extremities:  No cyanosis or clubbing.  HEENT:  Moist mucus membranes. No scleral injection or icterus.  Cardiovascular:  Regular rate & rhythm.  No edema.   Pulmonary:  Good aeration & clear to auscultation bilaterally. No accessory muscle use on room air. Abdomen: Soft. Normal bowel sounds. Nondistended. Grossly nontender. Musculoskeletal:  Normal bulk and tone. No joint deformity or effusion appreciated. Neurological: No meningismus. Cranial nerves 2-12 in tact. Alert and oriented 4.  LABS: CBC Latest Ref Rng & Units 09/15/2016 09/14/2016 09/13/2016  WBC 4.0 - 10.5 K/uL 7.9 9.0 6.8  Hemoglobin 12.0 - 15.0 g/dL 7.1(L) 6.9(LL) 7.4(L)  Hematocrit 36.0 - 46.0 % 19.8(L) 19.1(L) 20.5(L)  Platelets 150 - 400 K/uL 171 130(L) 81(L)   BMP Latest Ref Rng & Units 09/15/2016 09/14/2016 09/13/2016  Glucose 65 - 99 mg/dL 106(H) 129(H) 219(H)  BUN 6 - 20 mg/dL 126(H) 122(H) 103(H)  Creatinine 0.44 - 1.00 mg/dL 18.36(H) 16.29(H) 16.05(H)  Sodium 135 - 145 mmol/L 130(L) 129(L) 128(L)  Potassium 3.5 - 5.1 mmol/L 3.2(L) 3.3(L) 3.2(L)  Chloride 101 - 111 mmol/L 96(L) 97(L) 99(L)  CO2 22 - 32 mmol/L 14(L) 13(L) 13(L)  Calcium 8.9 - 10.3 mg/dL 8.4(L) 8.4(L) 8.7(L)     STUDIES:  TTE 09/13/16:  LV normal in size with moderate concentric hypertrophy. EF 55-60% with normal wall motion. Grade 1 diastolic dysfunction. LA moderately dilated & RA normal in size. RV normal in size and function. Mild to moderate aortic regurgitation without stenosis. Aortic root normal  in size. Moderate mitral regurgitation without stenosis. Trivial pulmonic regurgitation without stenosis. No tricuspid regurgitation. No pericardial effusion.  RENAL U/S 09/13/16:  No hydronephrosis. Echogenic liver normal size kidneys bilaterally.  PORT CXR 09/13/16:  Personally reviewed by me. Her previous opacities from 1/19 and resolves. No appreciated pleural effusion. Heart has questionable enlargement.  MICROBIOLOGY: MRSA PCR 09/13/16:  Negative   ANTIBIOTICS: None  SIGNIFICANT EVENTS: 1/20 - Admit for HTN emergency and AKI 1/21 - Transfer to [facility] for tunneled dialysis access  placement  LINES/TUBES: PIV x2  ASSESSMENT / PLAN:  41 y.o. female with hypertensive emergency and acute renal failure. Patient has a questionable component of chronic renal disease. She is planned for tunneled hemodialysis access placement today. She will likely need hemodialysis post access placement. Her hypertensive emergency has improved dramatically and she has weaned off her antihypertensive infusions. She is tolerating oral antihypertensive medications. I suspect her blood pressure will continue to improve with hemodialysis today. I am holding off on replacing the patient's mild hypokalemia given her pending hemodialysis. Appreciate assistance from both nephrology and vascular surgery.  1. Hypertensive Emergency: Discontinuing labetalol and nitroglycerin infusions. Continuing labetalol by mouth twice a day & Norvasc by mouth daily. Continuing to monitor vital signs per unit protocol. 2. Acute Renal Failure:  May have a component chronic renal failure. Nephrology consulted & following. Tunneled HD access by Vascular Surgery planned for today. Patient planned for ultrasound vein mapping. Attempted to contact nephrology to determine timing of hemodialysis. 3. Normocytic Normochromic Anemia:  Likely due to chronic disease. S/P type & screen this morning. Continuing to trend hemoglobin with CBC daily. 4. Anion Gap Metabolic Acidosis:  Likely due to renal failure. Continuing oral Bicarb TID. 5. Diastolic Congestive Heart Failure: Seen on echocardiogram. No signs of volume overload on physical exam today. 6. Valvular Heart Disease:  Aortic and mitral regurgitation seen on echocardiogram. 7. Hyponatremia:  Mild. Monitoring electrolytes daily.  8. Hypokalemia:  Mild. Holding off on replacing anticipating dialysis today. Monitoring electrolytes daily.  9. Thrombocytopenia:  Resolved. Possibly secondary to Hypertensive Emergency.  10. Diet:  NPO pending surgery today. Patient may resume previous diet  post access placement. 11. Tobacco Use Disorder:  Continuing Nicotine 7mg /24 hr patch.  12. History of Alcohol & Drug Use:  UDS positive for THC on admission but otherwise negative.  13. Prophylaxis:  Heparin [place] q8hr & SCDs.  14. Disposition:  Plan to transition patient to medical telemetry floor today post dialysis access placement.  I have spent a total of 39 minutes of time today caring for the patient, reviewing the patient's electronic medical record, and with more than 50% of that time spent coordinating care with the patient's transfer as well as reviewing the continuing plan of care with the patient and her nurse at bedside.  Sonia Baller Ashok Cordia, M.D. [facility] Pulmonary & Critical Care Pager:  [phone] After 3pm or if no response, call [phone] 09/15/2016, 8:33 AM

## 2016-09-15 NOTE — Progress Notes (Signed)
Subjective:  Just back from OR= R Ij Perm cath and R FA AVF insert / For first HD today   Objective Vital signs in last 24 hours: Vitals:   09/15/16 1400 09/15/16 1415 09/15/16 1430 09/15/16 1500  BP: 116/84 114/79    Pulse: 81 75    Resp: 18 13    Temp: 97.4 F (36.3 C)  97.5 F (36.4 C) (!) 96.1 F (35.6 C)  TempSrc:    Oral  SpO2: 95% 93%    Weight:      Height:       Weight change: -0.3 kg (-10.6 oz)  Physical Exam: General: post op lethargic  Heart: RRR  2/6 sem lsb , no rub or gallop Lungs: decr bs  Bases, otherwise CTA  , non labored breathing  Abdomen: bs POS, SOFT ,NT, ND Extremities:  bilat trace pedal edema Dialysis Access:  Just post op = R IJ perm cath /pos. bruit R FA AVF sites stable   Problem/Plan:   1. 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 HTN Emergency -  Resolved with  Iv Labetalol and IV Nitroglycerin  In fusions / transitioned  To po Labetalol  200 mg bid / Amlodip[ine 10mg   q day / monitor bp with initiation of HD / Cxr showing  some pulmonary edema = slowly uf on hd ,no sob currently  O2 sats  95-93% 3. Anemia -Of ESRD- will check Iron studies  And start ESA tomorrow on HD  4. Secondary hyperparathyroidism - Phos 10.6  satrt  Phoslo as Binder  Corec ca 9.3 / check PTH level  5. Hyponatremia - Fu  Labs with   Hd  Starting  6. Diastolic Congestive Hrt Failure  And  Aortic and Mitral Regurgitation per echo- 7. History of Substance abuse= Tobacco ,  Alcohol & Drug Use:  UDS positive for THC on admission but otherwise negative/ per admit =Nicotine 7mg /24 hr patch.   Ernest Haber, PA-C [place] [phone] 09/15/2016,3:19 PM  LOS: 3 days   Pt seen, examined and agree w A/P as above.  Kelly Splinter MD [facility] pager [phone]   09/15/2016, 4:29 PM    Labs: Basic Metabolic  Panel:  Recent Labs Lab 09/13/16 0204 09/14/16 0317 09/15/16 0301  NA 128* 129* 130*  K 3.2* 3.3* 3.2*  CL 99* 97* 96*  CO2 13* 13* 14*  GLUCOSE 219* 129* 106*  BUN 103* 122* 126*  CREATININE 16.05* 16.29* 18.36*  CALCIUM 8.7* 8.4* 8.4*  PHOS 8.2* 10.3* 10.6*   Liver Function Tests:  Recent Labs Lab 09/13/16 0204  AST 17  ALT 17  ALKPHOS 42  BILITOT 0.6  PROT 5.9*  ALBUMIN 3.3*   CBC:  Recent Labs Lab 09/12/16 2044 09/13/16 0204 09/14/16 0709 09/15/16 0301  WBC 11.5* 6.8 9.0 7.9  NEUTROABS  --   --  6.9 5.5  HGB 7.9* 7.4* 6.9* 7.1*  HCT 21.9* 20.5* 19.1* 19.8*  MCV 90.9 88.7 89.3 91.2  PLT 100* 81* 130* 171   Cardiac Enzymes:  Recent Labs Lab 09/13/16 0204 09/13/16 0731 09/13/16 1510  TROPONINI 0.33* 0.24* 0.19*   CBG: No results for input(s): GLUCAP in the last 168 hours.  Studies/Results: Dg Chest Port 1 View  Result Date:  09/15/2016 CLINICAL DATA:  Post AV fistula and dialysis catheter placement EXAM: PORTABLE CHEST 1 VIEW COMPARISON:  Portable exam 1427 hours compared to 09/13/2016 FINDINGS: New LEFT jugular central venous catheter with tip projecting over the inferior RIGHT atrium. Enlargement of cardiac silhouette with pulmonary vascular congestion. New diffuse BILATERAL pulmonary infiltrates compatible with pulmonary edema. Cannot exclude atelectasis versus consolidation in densely opacified LEFT lower lobe. No pneumothorax. Bones unremarkable. IMPRESSION: Tip of RIGHT jugular line projects over the inferior RIGHT atrium. Enlargement of cardiac silhouette with pulmonary vascular congestion and new severe diffuse BILATERAL airspace infiltrates consistent with pulmonary edema question fluid overload versus CHF. Cannot exclude coexistent atelectasis versus consolidation in LEFT lower lobe. Electronically Signed   By: Lavonia Dana M.D.   On: 09/15/2016 14:41   Dg Fluoro Guide Cv Line-no Report  Result Date: 09/15/2016 There is no Radiologist  interpretation  for this exam.  Medications: . sodium chloride 10 mL/hr (09/15/16 0939)   . amLODipine  10 mg Oral Daily  . [START ON 09/16/2016] calcium acetate  1,334 mg Oral TID WC  . [START ON 09/16/2016] darbepoetin (ARANESP) injection - DIALYSIS  40 mcg Intravenous Q Tue-HD  . heparin  5,000 Units Subcutaneous Q8H  . labetalol  200 mg Oral BID  . [START ON 09/16/2016] multivitamin  1 tablet Oral QHS  . nicotine  7 mg Transdermal Daily  . sodium bicarbonate  650 mg Oral TID  . sodium chloride flush  3 mL Intravenous Q12H

## 2016-09-15 NOTE — Anesthesia Procedure Notes (Signed)
Procedure Name: LMA Insertion Date/Time: 09/15/2016 11:48 AM Performed by: Shirlyn Goltz Pre-anesthesia Checklist: Patient identified, Emergency Drugs available, Suction available and Patient being monitored Patient Re-evaluated:Patient Re-evaluated prior to inductionOxygen Delivery Method: Circle system utilized Preoxygenation: Pre-oxygenation with 100% oxygen Intubation Type: IV induction Ventilation: Mask ventilation without difficulty LMA: LMA inserted LMA Size: 4.0 Number of attempts: 1 Placement Confirmation: positive ETCO2 and breath sounds checked- equal and bilateral Tube secured with: Tape Dental Injury: Teeth and Oropharynx as per pre-operative assessment

## 2016-09-15 NOTE — Consult Note (Addendum)
Referring Physician: Dr Justin Mend  Patient name: Melody [name] MRN: [id number] DOB: 03-22-1976 Sex: female  REASON FOR CONSULT: permanent hemodialysis access  HPI: Melody [name] is a 41 y.o. 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  She is right handed.  She has had no prior access procedures.  She does have a family history of hypertension and renal failure.  She has not yet started dialysis.  She was seen at [facility] with progressive renal failure and transferred to [facility].  She also has profound anemia thought to be due to her renal failure.  This is being followed by Nephrology and currently asymptomatic.  Past Medical History:  Diagnosis Date  . Alcohol abuse   . Drug abuse   . Ectopic pregnancy   . Hypertension Dx May 2016  . Tobacco abuse    Past Surgical History:  Procedure Laterality Date  . DILATION AND CURETTAGE OF UTERUS      Family History  Problem Relation Age of Onset  . CAD Mother   . Hypertension Mother   . Heart disease Mother   . Kidney failure Mother   . CAD Brother   . Hypertension Other   . Kidney failure Other   . Hypertension Maternal Grandmother   . Kidney failure Maternal Grandmother   . Congestive Heart Failure Maternal Grandmother     SOCIAL HISTORY: Social History   Social History  . Marital status: Single    Spouse name: N/A  . Number of children: 2   . Years of education: some colle   Occupational History  . Not on file.   Social History Main Topics  . Smoking status: Current Every Day Smoker    Packs/day: 0.00    Years: 0.00    Types: Cigars, Cigarettes    Start date: 01/21/2015  . Smokeless tobacco: Former Systems developer  . Alcohol use 0.0 oz/week     Comment: socially  . Drug use: Yes    Types: Marijuana  . Sexual activity: Yes    Birth control/ protection: None   Other Topics Concern  . Not on file   Social History Narrative   Looking for work    24 and 13 yr son's.    3 grandkids        No  Known Allergies  Current Facility-Administered Medications  Medication Dose Route Frequency Provider Last Rate Last Dose  . [MAR Hold] 0.9 %  sodium chloride infusion  250 mL Intravenous PRN Colbert Coyer, MD      . Doug Sou Hold] 0.9 %  sodium chloride infusion  250 mL Intravenous PRN Colbert Coyer, MD      . Doug Sou Hold] acetaminophen (TYLENOL) tablet 650 mg  650 mg Oral Q6H PRN Guy Begin, MD   650 mg at 09/14/16 2140  . [MAR Hold] amLODipine (NORVASC) tablet 10 mg  10 mg Oral Daily Guy Begin, MD   10 mg at 09/15/16 0900  . [MAR Hold] heparin injection 5,000 Units  5,000 Units Subcutaneous Q8H Colbert Coyer, MD   5,000 Units at 09/15/16 0530  . [MAR Hold] HYDROmorphone (DILAUDID) injection 0.5 mg  0.5 mg Intravenous Q2H PRN Guy Begin, MD   0.5 mg at 09/14/16 0842  . [MAR Hold] labetalol (NORMODYNE) tablet 200 mg  200 mg Oral BID Edrick Oh, MD   200 mg at 09/15/16 0900  . [MAR Hold] nicotine (NICODERM CQ - dosed in mg/24 hr) patch 7  mg  7 mg Transdermal Daily Guy Begin, MD   7 mg at 09/15/16 0901  . [MAR Hold] sodium bicarbonate tablet 650 mg  650 mg Oral TID Edrick Oh, MD   650 mg at 09/15/16 0900  . [MAR Hold] sodium chloride flush (NS) 0.9 % injection 3 mL  3 mL Intravenous Q12H Colbert Coyer, MD   3 mL at 09/14/16 2140  . [MAR Hold] sodium chloride flush (NS) 0.9 % injection 3 mL  3 mL Intravenous PRN Colbert Coyer, MD        ROS:   General:  No weight loss, Fever, chills  HEENT: No recent headaches, no nasal bleeding, no visual changes, no sore throat  Neurologic: No dizziness, blackouts, seizures. No recent symptoms of stroke or mini- stroke. No recent episodes of slurred speech, or temporary blindness.  Cardiac: No recent episodes of chest pain/pressure, no shortness of breath at rest.  + shortness of breath with exertion.  Denies history of atrial fibrillation or irregular heartbeat  Vascular: No history of rest  pain in feet.  No history of claudication.  No history of non-healing ulcer, No history of DVT   Pulmonary: No home oxygen, no productive cough, no hemoptysis,  No asthma or wheezing  Musculoskeletal:  [ ]  Arthritis, [ ]  Low back pain,  [ ]  Joint pain  Hematologic:No history of hypercoagulable state.  No history of easy bleeding.  No history of anemia  Gastrointestinal: No hematochezia or melena,  No gastroesophageal reflux, no trouble swallowing  Urinary: [X]  chronic Kidney disease, [ ]  on HD - [ ]  MWF or [ ]  TTHS, [ ]  Burning with urination, [ ]  Frequent urination, [ ]  Difficulty urinating;   Skin: No rashes  Psychological: No history of anxiety,  No history of depression   Physical Examination  Vitals:   09/15/16 0530 09/15/16 0600 09/15/16 0630 09/15/16 0700  BP: 136/89  (!) 144/104 (!) 146/102  Pulse: 76  82 75  Resp: 15 13 16 16   Temp:    98 F (36.7 C)  TempSrc:    Oral  SpO2: 93% 98% 94% 98%  Weight:      Height:        Body mass index is 23.48 kg/m.  General:  Alert and oriented, no acute distress HEENT: Normal Neck: No JVD Pulmonary: Clear to auscultation bilaterally Cardiac: Regular Rate and Rhythm  Abdomen: Soft, non-tender Skin: No rash Extremity Pulses:  2+ radial, brachial pulses bilaterally Musculoskeletal: No deformity or edema  Neurologic: Upper and lower extremity motor 5/5 and symmetric  DATA:  CBC    Component Value Date/Time   WBC 7.9 09/15/2016 0301   RBC 2.17 (L) 09/15/2016 0301   HGB 7.1 (L) 09/15/2016 0301   HCT 19.8 (L) 09/15/2016 0301   PLT 171 09/15/2016 0301   MCV 91.2 09/15/2016 0301   MCH 32.7 09/15/2016 0301   MCHC 35.9 09/15/2016 0301   RDW 14.0 09/15/2016 0301   LYMPHSABS 2.1 09/15/2016 0301   MONOABS 0.2 09/15/2016 0301   EOSABS 0.1 09/15/2016 0301   BASOSABS 0.0 09/15/2016 0301    BMET    Component Value Date/Time   NA 130 (L) 09/15/2016 0301   K 3.2 (L) 09/15/2016 0301   CL 96 (L) 09/15/2016 0301   CO2 14 (L)  09/15/2016 0301   GLUCOSE 106 (H) 09/15/2016 0301   BUN 126 (H) 09/15/2016 0301   CREATININE 18.36 (H) 09/15/2016 0301   CREATININE 1.15 (H) 10/12/2015 1703  CALCIUM 8.4 (L) 09/15/2016 0301   GFRNONAA 2 (L) 09/15/2016 0301   GFRNONAA 59 (L) 10/12/2015 1703   GFRAA 2 (L) 09/15/2016 0301   GFRAA 68 10/12/2015 1703     ASSESSMENT:  Needs hemodialysis access   PLAN:  1.  Palindrome catheter by Dr Donnetta Hutching today.  Procedure risk benefit complications discussed.  Will need vein mapping to plan permanent access later this week.   Ruta Hinds, MD [facility] Office: [phone] Pager: [phone]

## 2016-09-15 NOTE — Progress Notes (Signed)
Paged MD Ramaswamy to notify of patient bladder scan 173 with no urine output today. Patient is not complaining of any pain and states she will let me know if she feels the urge to void. Will rebladder scan in the AM according to MD and notify if there are any changes.   Levon Hedger, RN

## 2016-09-15 NOTE — H&P (View-Only) (Signed)
Referring Physician: Dr Justin [name]  Patient name: Melody [name] MRN: [id number] DOB: [birth year] Sex: female  REASON FOR CONSULT: permanent hemodialysis access  HPI: Melody [name] is a 41 y.o. 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  She is right handed.  She has had no prior access procedures.  She does have a family history of hypertension and renal failure.  She has not yet started dialysis.  She was seen at [name] with progressive renal failure and transferred to [name].  She also has profound anemia thought to be due to her renal failure.  This is being followed by Melody [name] and currently asymptomatic.  Past Medical History:  Diagnosis Date  . Alcohol abuse   . Drug abuse   . Ectopic pregnancy   . Hypertension Dx May 2016  . Tobacco abuse    Past Surgical History:  Procedure Laterality Date  . DILATION AND CURETTAGE OF UTERUS      Family History  Problem Relation Age of Onset  . CAD Mother   . Hypertension Mother   . Heart disease Mother   . Kidney failure Mother   . CAD Brother   . Hypertension Other   . Kidney failure Other   . Hypertension Maternal Grandmother   . Kidney failure Maternal Grandmother   . Congestive Heart Failure Maternal Grandmother     SOCIAL HISTORY: Social History   Social History  . Marital status: Single    Spouse name: N/A  . Number of children: 2   . Years of education: some colle   Occupational History  . Not on file.   Social History Main Topics  . Smoking status: Current Every Day Smoker    Packs/day: 0.00    Years: 0.00    Types: Cigars, Cigarettes    Start date: 01/21/2015  . Smokeless tobacco: Former Systems developer  . Alcohol use 0.0 oz/week     Comment: socially  . Drug use: Yes    Types: Marijuana  . Sexual activity: Yes    Birth control/ protection: None   Other Topics Concern  . Not on file   Social History Narrative   Looking for work    24 and 13 yr son's.    3 grandkids        No  Known Allergies  Current Facility-Administered Medications  Medication Dose Route Frequency Provider Last Rate Last Dose  . [MAR Hold] 0.9 %  sodium chloride infusion  250 mL Intravenous PRN Melody Coyer, MD      . Doug Sou Hold] 0.9 %  sodium chloride infusion  250 mL Intravenous PRN Melody Coyer, MD      . Doug Sou Hold] acetaminophen (TYLENOL) tablet 650 mg  650 mg Oral Q6H PRN Melody Begin, MD   650 mg at 09/14/16 2140  . [MAR Hold] amLODipine (NORVASC) tablet 10 mg  10 mg Oral Daily Melody Begin, MD   10 mg at 09/15/16 0900  . [MAR Hold] heparin injection 5,000 Units  5,000 Units Subcutaneous Q8H Melody Coyer, MD   5,000 Units at 09/15/16 0530  . [MAR Hold] HYDROmorphone (DILAUDID) injection 0.5 mg  0.5 mg Intravenous Q2H PRN Melody Begin, MD   0.5 mg at 09/14/16 0842  . [MAR Hold] labetalol (NORMODYNE) tablet 200 mg  200 mg Oral BID Melody Oh, MD   200 mg at 09/15/16 0900  . [MAR Hold] nicotine (NICODERM CQ - dosed in mg/24 hr) patch 7  mg  7 mg Transdermal Daily Melody Begin, MD   7 mg at 09/15/16 0901  . [MAR Hold] sodium bicarbonate tablet 650 mg  650 mg Oral TID Melody Oh, MD   650 mg at 09/15/16 0900  . [MAR Hold] sodium chloride flush (NS) 0.9 % injection 3 mL  3 mL Intravenous Q12H Melody Coyer, MD   3 mL at 09/14/16 2140  . [MAR Hold] sodium chloride flush (NS) 0.9 % injection 3 mL  3 mL Intravenous PRN Melody Coyer, MD        ROS:   General:  No weight loss, Fever, chills  HEENT: No recent headaches, no nasal bleeding, no visual changes, no sore throat  Neurologic: No dizziness, blackouts, seizures. No recent symptoms of stroke or mini- stroke. No recent episodes of slurred speech, or temporary blindness.  Cardiac: No recent episodes of chest pain/pressure, no shortness of breath at rest.  + shortness of breath with exertion.  Denies history of atrial fibrillation or irregular heartbeat  Vascular: No history of rest  pain in feet.  No history of claudication.  No history of non-healing ulcer, No history of DVT   Pulmonary: No home oxygen, no productive cough, no hemoptysis,  No asthma or wheezing  Musculoskeletal:  [ ]  Arthritis, [ ]  Low back pain,  [ ]  Joint pain  Hematologic:No history of hypercoagulable state.  No history of easy bleeding.  No history of anemia  Gastrointestinal: No hematochezia or melena,  No gastroesophageal reflux, no trouble swallowing  Urinary: [X]  chronic Kidney disease, [ ]  on HD - [ ]  MWF or [ ]  TTHS, [ ]  Burning with urination, [ ]  Frequent urination, [ ]  Difficulty urinating;   Skin: No rashes  Psychological: No history of anxiety,  No history of depression   Physical Examination  Vitals:   09/15/16 0530 09/15/16 0600 09/15/16 0630 09/15/16 0700  BP: 136/89  (!) 144/104 (!) 146/102  Pulse: 76  82 75  Resp: 15 13 16 16   Temp:    98 F (36.7 C)  TempSrc:    Oral  SpO2: 93% 98% 94% 98%  Weight:      Height:        Body mass index is 23.48 kg/m.  General:  Alert and oriented, no acute distress HEENT: Normal Neck: No JVD Pulmonary: Clear to auscultation bilaterally Cardiac: Regular Rate and Rhythm  Abdomen: Soft, non-tender Skin: No rash Extremity Pulses:  2+ radial, brachial pulses bilaterally Musculoskeletal: No deformity or edema  Neurologic: Upper and lower extremity motor 5/5 and symmetric  DATA:  CBC    Component Value Date/Time   WBC 7.9 09/15/2016 0301   RBC 2.17 (L) 09/15/2016 0301   HGB 7.1 (L) 09/15/2016 0301   HCT 19.8 (L) 09/15/2016 0301   PLT 171 09/15/2016 0301   MCV 91.2 09/15/2016 0301   MCH 32.7 09/15/2016 0301   MCHC 35.9 09/15/2016 0301   RDW 14.0 09/15/2016 0301   LYMPHSABS 2.1 09/15/2016 0301   MONOABS 0.2 09/15/2016 0301   EOSABS 0.1 09/15/2016 0301   BASOSABS 0.0 09/15/2016 0301    BMET    Component Value Date/Time   NA 130 (L) 09/15/2016 0301   K 3.2 (L) 09/15/2016 0301   CL 96 (L) 09/15/2016 0301   CO2 14 (L)  09/15/2016 0301   GLUCOSE 106 (H) 09/15/2016 0301   BUN 126 (H) 09/15/2016 0301   CREATININE 18.36 (H) 09/15/2016 0301   CREATININE 1.15 (H) 10/12/2015 1703  CALCIUM 8.4 (L) 09/15/2016 0301   GFRNONAA 2 (L) 09/15/2016 0301   GFRNONAA 59 (L) 10/12/2015 1703   GFRAA 2 (L) 09/15/2016 0301   GFRAA 68 10/12/2015 1703     ASSESSMENT:  Needs hemodialysis access   PLAN:  1.  Palindrome catheter by Melody Donnetta Hutching today.  Procedure risk benefit complications discussed.  Will need vein mapping to plan permanent access later this week.   Ruta Hinds, MD [facility] Office: [phone] Pager: [phone]

## 2016-09-16 ENCOUNTER — Encounter (HOSPITAL_COMMUNITY): Payer: Self-pay | Admitting: Vascular Surgery

## 2016-09-16 ENCOUNTER — Encounter (HOSPITAL_COMMUNITY): Payer: Medicaid Other

## 2016-09-16 ENCOUNTER — Other Ambulatory Visit: Payer: Self-pay | Admitting: *Deleted

## 2016-09-16 DIAGNOSIS — J9601 Acute respiratory failure with hypoxia: Secondary | ICD-10-CM

## 2016-09-16 DIAGNOSIS — D696 Thrombocytopenia, unspecified: Secondary | ICD-10-CM

## 2016-09-16 DIAGNOSIS — Z4931 Encounter for adequacy testing for hemodialysis: Secondary | ICD-10-CM

## 2016-09-16 DIAGNOSIS — D638 Anemia in other chronic diseases classified elsewhere: Secondary | ICD-10-CM

## 2016-09-16 DIAGNOSIS — N186 End stage renal disease: Secondary | ICD-10-CM

## 2016-09-16 LAB — RENAL FUNCTION PANEL
Albumin: 3.1 g/dL — ABNORMAL LOW (ref 3.5–5.0)
Anion gap: 15 (ref 5–15)
BUN: 64 mg/dL — AB (ref 6–20)
CHLORIDE: 97 mmol/L — AB (ref 101–111)
CO2: 20 mmol/L — AB (ref 22–32)
Calcium: 8.3 mg/dL — ABNORMAL LOW (ref 8.9–10.3)
Creatinine, Ser: 11.6 mg/dL — ABNORMAL HIGH (ref 0.44–1.00)
GFR calc Af Amer: 4 mL/min — ABNORMAL LOW (ref >60)
GFR, EST NON AFRICAN AMERICAN: 4 mL/min — AB (ref >60)
Glucose, Bld: 100 mg/dL — ABNORMAL HIGH (ref 65–99)
Phosphorus: 6.4 mg/dL — ABNORMAL HIGH (ref 2.5–4.6)
Potassium: 2.9 mmol/L — ABNORMAL LOW (ref 3.5–5.1)
Sodium: 132 mmol/L — ABNORMAL LOW (ref 135–145)

## 2016-09-16 LAB — UIFE/LIGHT CHAINS/TP QN, 24-HR UR
% BETA, URINE: 13.6 %
ALBUMIN, U: 51.8 %
ALPHA 1 URINE: 8.8 %
Alpha 2, Urine: 11.5 %
FREE LAMBDA LT CHAINS, UR: 33.8 mg/L — AB (ref 0.24–6.66)
Free Kappa/Lambda Ratio: 10.41 — ABNORMAL HIGH (ref 2.04–10.37)
Free Lt Chn Excr Rate: 352 mg/L — ABNORMAL HIGH (ref 1.35–24.19)
GAMMA GLOBULIN URINE: 14.3 %
PDF-U24IEL: 0
TOTAL VOLUME: 150
Time: 24 hours
Total Protein, Urine-Ur/day: 368 mg/24 hr — ABNORMAL HIGH (ref 30–150)
Total Protein, Urine: 245.6 mg/dL

## 2016-09-16 LAB — IRON AND TIBC
IRON: 34 ug/dL (ref 28–170)
Saturation Ratios: 15 % (ref 10.4–31.8)
TIBC: 225 ug/dL — ABNORMAL LOW (ref 250–450)
UIBC: 191 ug/dL

## 2016-09-16 LAB — CBC WITH DIFFERENTIAL/PLATELET
Basophils Absolute: 0 10*3/uL (ref 0.0–0.1)
Basophils Relative: 0 %
EOS ABS: 0.1 10*3/uL (ref 0.0–0.7)
EOS PCT: 1 %
HCT: 19.9 % — ABNORMAL LOW (ref 36.0–46.0)
Hemoglobin: 7.1 g/dL — ABNORMAL LOW (ref 12.0–15.0)
LYMPHS PCT: 14 %
Lymphs Abs: 1.2 10*3/uL (ref 0.7–4.0)
MCH: 32.6 pg (ref 26.0–34.0)
MCHC: 35.7 g/dL (ref 30.0–36.0)
MCV: 91.3 fL (ref 78.0–100.0)
MONO ABS: 0.5 10*3/uL (ref 0.1–1.0)
MONOS PCT: 6 %
Neutro Abs: 6.4 10*3/uL (ref 1.7–7.7)
Neutrophils Relative %: 79 %
PLATELETS: 160 10*3/uL (ref 150–400)
RBC: 2.18 MIL/uL — ABNORMAL LOW (ref 3.87–5.11)
RDW: 14.4 % (ref 11.5–15.5)
WBC: 8.1 10*3/uL (ref 4.0–10.5)

## 2016-09-16 LAB — MAGNESIUM: Magnesium: 2.4 mg/dL (ref 1.7–2.4)

## 2016-09-16 MED ORDER — ONDANSETRON HCL 4 MG/2ML IJ SOLN
4.0000 mg | Freq: Four times a day (QID) | INTRAMUSCULAR | Status: DC | PRN
Start: 2016-09-16 — End: 2016-09-19
  Administered 2016-09-16 – 2016-09-17 (×2): 4 mg via INTRAVENOUS
  Filled 2016-09-16 (×3): qty 2

## 2016-09-16 MED ORDER — DARBEPOETIN ALFA 40 MCG/0.4ML IJ SOSY
PREFILLED_SYRINGE | INTRAMUSCULAR | Status: AC
  Filled 2016-09-16: qty 0.4

## 2016-09-16 MED ORDER — HYDRALAZINE HCL 20 MG/ML IJ SOLN
10.0000 mg | Freq: Four times a day (QID) | INTRAMUSCULAR | Status: DC | PRN
  Administered 2016-09-17: 10 mg via INTRAVENOUS
  Filled 2016-09-16: qty 1

## 2016-09-16 MED ORDER — HYDROMORPHONE HCL 1 MG/ML IJ SOLN
INTRAMUSCULAR | Status: AC
  Filled 2016-09-16: qty 1

## 2016-09-16 MED ORDER — HEPARIN SODIUM (PORCINE) 5000 UNIT/ML IJ SOLN
5000.0000 [IU] | Freq: Three times a day (TID) | INTRAMUSCULAR | Status: DC
  Administered 2016-09-17 (×2): 5000 [IU] via SUBCUTANEOUS
  Filled 2016-09-16 (×5): qty 1

## 2016-09-16 NOTE — Anesthesia Postprocedure Evaluation (Signed)
Anesthesia Post Note  Patient: Melody [name]  Procedure(s) Performed: Procedure(s) (LRB): INSERTION OF DIALYSIS CATHETER ON RIGHT SIDE (Right) ARTERIOVENOUS (AV) FISTULA CREATION UPPER RIGHT ARM (Right)  Patient location during evaluation: PACU Anesthesia Type: General Level of consciousness: awake and alert Pain management: pain level controlled Vital Signs Assessment: post-procedure vital signs reviewed and stable Respiratory status: spontaneous breathing, nonlabored ventilation, respiratory function stable and patient connected to nasal cannula oxygen Cardiovascular status: blood pressure returned to baseline and stable Postop Assessment: no signs of nausea or vomiting Anesthetic complications: no        Last Vitals:  Vitals:   09/16/16 1620 09/16/16 1726  BP: (!) 148/98 (!) 159/94  Pulse: 75 90  Resp: 15 17  Temp: 36.8 C 36.8 C    Last Pain:  Vitals:   09/16/16 1726  TempSrc: Oral  PainSc:    Pain Goal: Patients Stated Pain Goal: 3 (09/16/16 0337)               Tiajuana Amass

## 2016-09-16 NOTE — Progress Notes (Signed)
[facility]  Subjective  - Doing well over all.   Objective (!) 145/85 80 97.7 F (36.5 C) (Oral) 16 97%  Intake/Output Summary (Last 24 hours) at 09/16/16 0730 Last data filed at 09/16/16 0336  Gross per 24 hour  Intake              600 ml  Output             1450 ml  Net             -850 ml    Right radial cephalic fistula with palpable thrill and distal palpable radial pulse. Right IJ catheter intact  Assessment/Planning: POD # 1 PROCEDURE: #1 right IJ hemodialysis catheter, #2 right radiocephalic fistula   F/U in 4-6 weeks with duplex of fistula. Disposition stable from a vascular point of view.  Laurence Slate [facility] 09/16/2016 7:30 AM --  Laboratory Lab Results:  Recent Labs  09/15/16 0301 09/16/16 0406  WBC 7.9 8.1  HGB 7.1* 7.1*  HCT 19.8* 19.9*  PLT 171 160   BMET  Recent Labs  09/15/16 0301 09/16/16 0406  NA 130* 132*  K 3.2* 2.9*  CL 96* 97*  CO2 14* 20*  GLUCOSE 106* 100*  BUN 126* 64*  CREATININE 18.36* 11.60*  CALCIUM 8.4* 8.3*    COAG No results found for: INR, PROTIME No results found for: PTT

## 2016-09-16 NOTE — Progress Notes (Signed)
Subjective:  Had 1st HD yest, still nauseous, made some urine today first time in days.  No SOB .   Objective Vital signs in last 24 hours: Vitals:   09/16/16 1350 09/16/16 1430 09/16/16 1500 09/16/16 1530  BP: 139/90 (!) 154/92 (!) 147/88 (!) 145/93  Pulse: 78 78 77 75  Resp: 14 13 14 14   Temp:      TempSrc:      SpO2:      Weight:      Height:       Weight change: -1.1 kg (-2 lb 6.8 oz)  Physical Exam: General: post op lethargic  Heart: RRR  2/6 sem lsb , no rub or gallop Lungs: decr bs  Bases, otherwise CTA  , non labored breathing  Abdomen: bs POS, SOFT ,NT, ND Extremities:  bilat trace pedal edema Dialysis Access:  Just post op = R IJ perm cath /pos. bruit R FA AVF sites stable   Problem/Plan:   1. New ESRD - -  K 3.2  Use  High  K  [place] on hd  / first hd today sp R ij  P.cath / R RC avf  Insert today/ [facility] to be cl[facility]  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 Presented with severe azotemia, last creat 1.2 range in October 2017 here.  BP's 230/130 in ED.  Hx of severe HTN in past years evident in the chart.  UA was normal last year, UA here is pending. Plan HD again today and tomorrow. 2. HTN Emergency -  Resolved with  Iv Labetalol and IV Nitroglycerin  In fusions / transitioned  To po Labetalol  200 mg bid / Amlodip[ine 10mg   q day / monitor bp with initiation of HD / Cxr showing  some pulmonary edema = slowly uf on hd ,no sob currently  O2 sats  95-93% 3. Anemia -Of ESRD- will check Iron studies  And start ESA tomorrow on HD  4. Secondary hyperparathyroidism - Phos 10.6  satrt  Phoslo as Binder  Corec ca 9.3 / check PTH level  5. Hyponatremia - Fu  Labs with   Hd  Starting  6. Diastolic Congestive Hrt Failure  And  Aortic and Mitral Regurgitation per echo- 7. History of Substance abuse= Tobacco ,  Alcohol & Drug Use:  UDS positive for THC on admission but otherwise negative/ per admit =Nicotine 7mg /24 hr patch.   Ernest Haber,  PA-C [place] [phone] 09/16/2016,3:56 PM  LOS: 4 days   Pt seen, examined and agree w A/P as above.  Kelly Splinter MD [facility] pager [phone]   09/16/2016, 3:56 PM    Labs: Basic Metabolic Panel:  Recent Labs Lab 09/14/16 0317 09/15/16 0301 09/16/16 0406  NA 129* 130* 132*  K 3.3* 3.2* 2.9*  CL 97* 96* 97*  CO2 13* 14* 20*  GLUCOSE 129* 106* 100*  BUN 122* 126* 64*  CREATININE 16.29* 18.36* 11.60*  CALCIUM 8.4* 8.4* 8.3*  PHOS 10.3* 10.6* 6.4*   Liver Function Tests:  Recent Labs Lab 09/13/16 0204 09/16/16 0406  AST 17  --   ALT 17  --   ALKPHOS 42  --   BILITOT 0.6  --   PROT 5.9*  --   ALBUMIN 3.3* 3.1*   CBC:  Recent Labs Lab 09/12/16 2044 09/13/16 0204 09/14/16 0709 09/15/16 0301 09/16/16 0406  WBC 11.5* 6.8 9.0 7.9 8.1  NEUTROABS  --   --  6.9 5.5 6.4  HGB 7.9* 7.4* 6.9* 7.1* 7.1*  HCT 21.9* 20.5* 19.1* 19.8* 19.9*  MCV 90.9 88.7 89.3 91.2 91.3  PLT 100* 81* 130* 171 160   Cardiac Enzymes:  Recent Labs Lab 09/13/16 0204 09/13/16 0731 09/13/16 1510  TROPONINI 0.33* 0.24* 0.19*   CBG: No results for input(s): GLUCAP in the last 168 hours.  Studies/Results: Dg Chest Port 1 View  Result Date: 09/15/2016 CLINICAL DATA:  Post AV fistula and dialysis catheter placement EXAM: PORTABLE CHEST 1 VIEW COMPARISON:  Portable exam 1427 hours compared to 09/13/2016 FINDINGS: New LEFT jugular central venous catheter with tip projecting over the inferior RIGHT atrium. Enlargement of cardiac silhouette with pulmonary vascular congestion. New diffuse BILATERAL pulmonary infiltrates compatible with pulmonary edema. Cannot exclude atelectasis versus consolidation in densely opacified LEFT lower lobe. No pneumothorax. Bones unremarkable. IMPRESSION: Tip of RIGHT jugular line projects over the inferior RIGHT atrium. Enlargement of cardiac silhouette with pulmonary vascular congestion and new severe diffuse BILATERAL  airspace infiltrates consistent with pulmonary edema question fluid overload versus CHF. Cannot exclude coexistent atelectasis versus consolidation in LEFT lower lobe. Electronically Signed   By: Lavonia Dana M.D.   On: 09/15/2016 14:41   Dg Fluoro Guide Cv Line-no Report  Result Date: 09/15/2016 There is no Radiologist interpretation  for this exam.  Medications: . sodium chloride 10 mL/hr (09/15/16 0939)   . amLODipine  10 mg Oral Daily  . calcium acetate  1,334 mg Oral TID WC  . darbepoetin (ARANESP) injection - DIALYSIS  40 mcg Intravenous Q Tue-HD  . heparin  5,000 Units Subcutaneous Q8H  . HYDROmorphone      . labetalol  200 mg Oral BID  . multivitamin  1 tablet Oral QHS  . nicotine  7 mg Transdermal Daily  . sodium bicarbonate  650 mg Oral TID  . sodium chloride flush  3 mL Intravenous Q12H

## 2016-09-16 NOTE — Progress Notes (Signed)
Patient ID: Melody [name], female   DOB: 1975/10/17, 41 y.o.   MRN: [id number]  PROGRESS NOTE    Melody [name]  SWH:[id number] DOB: Feb 16, 1976 DOA: 09/12/2016  PCP: Minerva Ends, MD   Brief Narrative:  41 y.o. woman with past medical history significant for HTN, smoker, CKD stage 3 (Cr in 2016 was 1.69). She presented to [facility] with 1 week history of headache, abdominal pain and occasional chest pain.  She also had some shortness of breath over the last 1 week PTA especially when she would lay down. Her SBP on admission was in 250 range and her Cr was 16. She has not noticed any decrease in urine output or change in urine characteristics.  She has not had any lower extremity edema.   She was subsequently transferred to [facility] due to renal failure. She also had profound anemia thought to be due to renal failure.   Seen by vascular for HD access. The first HD was 1/22.  Patient weaned off of fentanyl drip on 1/21 & nitroglycerin drip on 1/20.  TRH continued to follow from PCCM starting 1/23.   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 - Nephrology following - Plan for HD today and then again tomorrow  - Continue phoslo, sodium bicarb  Secondary hyperparathyroidism - Continue phoslo  Severe anemia / Anemia of chronic kidney failure  - Getting Aranesp with HD  Accelerated hypertension - Currently on Norvasc 10 mg daily, labetalol 200 mg twice a day - Added hydralazine PRN for BP above 150/90  Acute respiratory failure with hypoxia / Metabolic acidosis - CXR 6/65 showed enlargement of cardiac silhouette with pulmonary vascular congestion and new severe diffuse bilateral airspace infiltrates consistent with pulmonary edema question fluid overload versus CHF. Cannot exclude coexistent atelectasis versus consolidation in left lower lobe.  -  Subsequently improved to 100% - No respiratory distress   Hypokalemia - Will be corrected with hemodialysis  - Follow up BMP in am - Check Magnesium level in am  Chronic diastolic congestive heart failure - ECHO 09/13/16 showed grade 1 DD with preserved EF; mild to moderate MR  Tobacco abuse - Counseled on cessation - Has nicotine patch ordered    DVT prophylaxis: SCD's bilaterally  Code Status: full code  Family Communication: no family at the bedside this am Disposition Plan: home once cleared by renal   Consultants:   Vascular surgery for HD access  PCCM - for hypertensive urgency   Nephrology   Procedures:  TTE 09/13/16:  LV normal in size with moderate concentric hypertrophy. EF 55-60% with normal wall motion. Grade 1 diastolic dysfunction. LA moderately dilated & RA normal in size. RV normal in size and function. Mild to moderate aortic regurgitation without stenosis. Aortic root normal in size. Moderate mitral regurgitation without stenosis. Trivial pulmonic regurgitation without stenosis. No tricuspid regurgitation. No pericardial effusion.  RENAL U/S 09/13/16:  No hydronephrosis. Echogenic liver normal size kidneys bilaterally.  Antimicrobials:   None   Subjective: Some nausea this am.  Objective: Vitals:   09/16/16 1530 09/16/16 1600 09/16/16 1620 09/16/16 1726  BP: (!) 145/93 140/90 (!) 148/98 (!) 159/94  Pulse: 75 74 75 90  Resp: 14 14 15 17   Temp:   98.3 F (36.8 C) 98.3 F (36.8 C)  TempSrc:   Oral Oral  SpO2:  98% 100%  Weight:   61.1 kg (134 lb 11.2 oz)   Height:        Intake/Output Summary (Last 24 hours) at 09/16/16 1805 Last data filed at 09/16/16 1620  Gross per 24 hour  Intake              240 ml  Output             2850 ml  Net            -2610 ml   Filed Weights   09/16/16 0335 09/16/16 1346 09/16/16 1620  Weight: 63.5 kg (139 lb 14.4 oz) 63 kg (138 lb 14.2 oz) 61.1 kg (134 lb 11.2 oz)    Examination:  General exam:  Appears calm and comfortable  Respiratory system: Clear to auscultation. Respiratory effort normal. Cardiovascular system: S1 & S2 heard, RRR. No JVD, murmurs, rubs, gallops or clicks. No pedal edema. Gastrointestinal system: Abdomen is nondistended, soft and nontender. No organomegaly or masses felt. Normal bowel sounds heard. Central nervous system: Alert and oriented. No focal neurological deficits. Extremities: Symmetric 5 x 5 power. Skin: No rashes, lesions or ulcers Psychiatry: Judgement and insight appear normal. Mood & affect appropriate.   Data Reviewed: I have personally reviewed following labs and imaging studies  CBC:  Recent Labs Lab 09/12/16 2044 09/13/16 0204 09/14/16 0709 09/15/16 0301 09/16/16 0406  WBC 11.5* 6.8 9.0 7.9 8.1  NEUTROABS  --   --  6.9 5.5 6.4  HGB 7.9* 7.4* 6.9* 7.1* 7.1*  HCT 21.9* 20.5* 19.1* 19.8* 19.9*  MCV 90.9 88.7 89.3 91.2 91.3  PLT 100* 81* 130* 171 505   Basic Metabolic Panel:  Recent Labs Lab 09/12/16 2044 09/13/16 0204 09/14/16 0317 09/15/16 0301 09/16/16 0406  NA 132* 128* 129* 130* 132*  K 3.0* 3.2* 3.3* 3.2* 2.9*  CL 102 99* 97* 96* 97*  CO2 12* 13* 13* 14* 20*  GLUCOSE 119* 219* 129* 106* 100*  BUN 98* 103* 122* 126* 64*  CREATININE 15.78* 16.05* 16.29* 18.36* 11.60*  CALCIUM 8.7* 8.7* 8.4* 8.4* 8.3*  MG  --   --  3.0* 3.0* 2.4  PHOS  --  8.2* 10.3* 10.6* 6.4*   GFR: Estimated Creatinine Clearance: 5.9 mL/min (by C-G formula based on SCr of 11.6 mg/dL (H)). Liver Function Tests:  Recent Labs Lab 09/13/16 0204 09/16/16 0406  AST 17  --   ALT 17  --   ALKPHOS 42  --   BILITOT 0.6  --   PROT 5.9*  --   ALBUMIN 3.3* 3.1*   No results for input(s): LIPASE, AMYLASE in the last 168 hours. No results for input(s): AMMONIA in the last 168 hours. Coagulation Profile: No results for input(s): INR, PROTIME in the last 168 hours. Cardiac Enzymes:  Recent Labs Lab 09/13/16 0204 09/13/16 0731 09/13/16 1510    TROPONINI 0.33* 0.24* 0.19*   BNP (last 3 results) No results for input(s): PROBNP in the last 8760 hours. HbA1C: No results for input(s): HGBA1C in the last 72 hours. CBG: No results for input(s): GLUCAP in the last 168 hours. Lipid Profile: No results for input(s): CHOL, HDL, LDLCALC, TRIG, CHOLHDL, LDLDIRECT in the last 72 hours. Thyroid Function Tests: No results for input(s): TSH, T4TOTAL, FREET4, T3FREE, THYROIDAB in the last 72 hours. Anemia Panel:  Recent Labs  09/16/16 1327  TIBC 225*  IRON 34   Urine analysis:    Component Value Date/Time   COLORURINE YELLOW 03/23/2016 1411  APPEARANCEUR CLEAR 03/23/2016 1411   LABSPEC 1.011 03/23/2016 1411   PHURINE 6.5 03/23/2016 1411   GLUCOSEU NEGATIVE 03/23/2016 1411   HGBUR NEGATIVE 03/23/2016 1411   BILIRUBINUR NEGATIVE 03/23/2016 1411   KETONESUR NEGATIVE 03/23/2016 1411   PROTEINUR NEGATIVE 03/23/2016 1411   UROBILINOGEN 1.0 01/17/2015 0935   NITRITE NEGATIVE 03/23/2016 1411   LEUKOCYTESUR NEGATIVE 03/23/2016 1411   Sepsis Labs: @LABRCNTIP (procalcitonin:4,lacticidven:4)  Recent Results (from the past 240 hour(s))  MRSA PCR Screening     Status: None   Collection Time: 09/13/16 12:59 AM  Result Value Ref Range Status   MRSA by PCR NEGATIVE NEGATIVE Final      Radiology Studies: Dg Chest 1 View Result Date: 09/13/2016 1. Stable mild cardiomegaly. 2. Near complete resolution of bilateral lung opacities, consistent with nearly resolved pulmonary edema.   US Renal Result Date: 09/13/2016 1. No hydronephrosis. 2. Echogenic normal size kidneys bilaterally, indicating renal parenchymal disease of uncertain chronicity. 3. Normal bladder. Electronically Signed   By: Ilona Sorrel M.D.   On: 09/13/2016 09:20   Dg Chest Port 1 View Result Date: 09/15/2016 Tip of RIGHT jugular line projects over the inferior RIGHT atrium. Enlargement of cardiac silhouette with pulmonary vascular congestion and new severe diffuse BILATERAL  airspace infiltrates consistent with pulmonary edema question fluid overload versus CHF. Cannot exclude coexistent atelectasis versus consolidation in LEFT lower lobe.    Scheduled Meds: . amLODipine  10 mg Oral Daily  . calcium acetate  1,334 mg Oral TID WC  . Darbepoetin Alfa      . darbepoetin (ARANESP) injection - DIALYSIS  40 mcg Intravenous Q Tue-HD  . heparin  5,000 Units Subcutaneous Q8H  . labetalol  200 mg Oral BID  . multivitamin  1 tablet Oral QHS  . nicotine  7 mg Transdermal Daily  . sodium bicarbonate  650 mg Oral TID  . sodium chloride flush  3 mL Intravenous Q12H   Continuous Infusions: . sodium chloride 10 mL/hr (09/15/16 0939)     LOS: 4 days    Time spent: 25 minutes  Greater than 50% of the time spent on counseling and coordinating the care.   Leisa Lenz, MD [facility] Pager [phone]  If 7PM-7AM, please contact night-coverage [email or web address] Password [facility] 09/16/2016, 6:05 PM

## 2016-09-16 NOTE — Progress Notes (Signed)
Bladder scan showed 47cc of urine in the bladder. Abdomen soft and not distended. Pt denies need to void at this time. MD made aware of results.   Pt states Zofran alleviated nausea. Pt still requests clear liquid diet - told pt to inform RN when wants to advance.   Fritz Pickerel, RN

## 2016-09-16 NOTE — Progress Notes (Addendum)
Emesis x1. Patient voided 350 ml.  Bladder scan not done d/t patient voiding. Will continue to monitor.

## 2016-09-16 NOTE — Care Management Note (Signed)
Case Management Note Marvetta Gibbons RN, BSN Unit 2W-Case Manager [phone]  Patient Details  Name: MARIETA MARKOV MRN: [id number] Date of Birth: 1976/04/09  Subjective/Objective:  Pt admitted with HTN emergency-  RF- now ESRD- with perm cath placed 1/22- and first HD tx started-   Pt tx from ICU to 2W on 09/15/16                Action/Plan: PTA pt lived at home- was independent- has mom and sister as support- PCP- [facility]- [place] with pt at bedside for questions relating to FMLA/disability- pt does not work- so FMLA is not applicable- pt to f/u with DSS regarding disability- to see if she may potentially be eligible.  Pt is in process of being clipped for outpt HD center per renal MD note.  CM to follow for any further d/c needs.   Expected Discharge Date:                Expected Discharge Plan:  Home/Self Care  In-House Referral:     Discharge planning Services  CM Consult  Post Acute Care Choice:    Choice offered to:     DME Arranged:    DME Agency:     HH Arranged:    HH Agency:     Status of Service:  In process, will continue to follow  If discussed at Long Length of Stay Meetings, dates discussed:    Additional Comments:  Dawayne Patricia, RN 09/16/2016, 11:54 AM

## 2016-09-17 ENCOUNTER — Telehealth: Payer: Self-pay | Admitting: Vascular Surgery

## 2016-09-17 DIAGNOSIS — N186 End stage renal disease: Secondary | ICD-10-CM

## 2016-09-17 DIAGNOSIS — Z992 Dependence on renal dialysis: Secondary | ICD-10-CM

## 2016-09-17 LAB — PTH, INTACT AND CALCIUM
Calcium, Total (PTH): 8.1 mg/dL — ABNORMAL LOW (ref 8.7–10.2)
PTH: 301 pg/mL — AB (ref 15–65)

## 2016-09-17 LAB — CBC WITH DIFFERENTIAL/PLATELET
BASOS ABS: 0 10*3/uL (ref 0.0–0.1)
Basophils Relative: 0 %
EOS PCT: 2 %
Eosinophils Absolute: 0.2 10*3/uL (ref 0.0–0.7)
HCT: 20.1 % — ABNORMAL LOW (ref 36.0–46.0)
HEMOGLOBIN: 7 g/dL — AB (ref 12.0–15.0)
LYMPHS PCT: 22 %
Lymphs Abs: 1.8 10*3/uL (ref 0.7–4.0)
MCH: 32.6 pg (ref 26.0–34.0)
MCHC: 34.8 g/dL (ref 30.0–36.0)
MCV: 93.5 fL (ref 78.0–100.0)
Monocytes Absolute: 0.7 10*3/uL (ref 0.1–1.0)
Monocytes Relative: 8 %
NEUTROS PCT: 68 %
Neutro Abs: 5.3 10*3/uL (ref 1.7–7.7)
PLATELETS: 169 10*3/uL (ref 150–400)
RBC: 2.15 MIL/uL — AB (ref 3.87–5.11)
RDW: 14.8 % (ref 11.5–15.5)
WBC: 7.8 10*3/uL (ref 4.0–10.5)

## 2016-09-17 LAB — RENAL FUNCTION PANEL
ALBUMIN: 2.9 g/dL — AB (ref 3.5–5.0)
ANION GAP: 13 (ref 5–15)
BUN: 35 mg/dL — AB (ref 6–20)
CHLORIDE: 97 mmol/L — AB (ref 101–111)
CO2: 26 mmol/L (ref 22–32)
Calcium: 8.8 mg/dL — ABNORMAL LOW (ref 8.9–10.3)
Creatinine, Ser: 8.49 mg/dL — ABNORMAL HIGH (ref 0.44–1.00)
GFR, EST AFRICAN AMERICAN: 6 mL/min — AB (ref >60)
GFR, EST NON AFRICAN AMERICAN: 5 mL/min — AB (ref >60)
Glucose, Bld: 100 mg/dL — ABNORMAL HIGH (ref 65–99)
PHOSPHORUS: 6.5 mg/dL — AB (ref 2.5–4.6)
POTASSIUM: 3.3 mmol/L — AB (ref 3.5–5.1)
Sodium: 136 mmol/L (ref 135–145)

## 2016-09-17 LAB — MAGNESIUM: MAGNESIUM: 2.5 mg/dL — AB (ref 1.7–2.4)

## 2016-09-17 LAB — HEPATITIS B SURFACE ANTIBODY,QUALITATIVE: Hep B S Ab: REACTIVE

## 2016-09-17 LAB — HEPATITIS B CORE ANTIBODY, TOTAL: Hep B Core Total Ab: POSITIVE — AB

## 2016-09-17 MED ORDER — HYDROCODONE-ACETAMINOPHEN 5-325 MG PO TABS
ORAL_TABLET | ORAL | Status: AC
  Filled 2016-09-17: qty 1

## 2016-09-17 MED ORDER — HYDRALAZINE HCL 10 MG PO TABS: 10.0000 mg | ORAL_TABLET | Freq: Three times a day (TID) | ORAL | Status: DC

## 2016-09-17 MED ORDER — DARBEPOETIN ALFA 60 MCG/0.3ML IJ SOSY: 60.0000 ug | PREFILLED_SYRINGE | INTRAMUSCULAR | Status: DC

## 2016-09-17 MED ORDER — DOCUSATE SODIUM 100 MG PO CAPS
100.0000 mg | ORAL_CAPSULE | Freq: Two times a day (BID) | ORAL | Status: DC
  Administered 2016-09-17 – 2016-09-19 (×5): 100 mg via ORAL
  Filled 2016-09-17 (×5): qty 1

## 2016-09-17 MED ORDER — SODIUM CHLORIDE 0.9 % IV SOLN
125.0000 mg | Freq: Every day | INTRAVENOUS | Status: DC
  Administered 2016-09-17 – 2016-09-18 (×2): 125 mg via INTRAVENOUS
  Filled 2016-09-17 (×5): qty 10

## 2016-09-17 MED ORDER — HYDRALAZINE HCL 50 MG PO TABS
50.0000 mg | ORAL_TABLET | Freq: Three times a day (TID) | ORAL | Status: DC
  Administered 2016-09-17 – 2016-09-18 (×4): 50 mg via ORAL
  Filled 2016-09-17 (×4): qty 1

## 2016-09-17 MED ORDER — HYDROCODONE-ACETAMINOPHEN 5-325 MG PO TABS
1.0000 | ORAL_TABLET | ORAL | Status: DC | PRN
  Administered 2016-09-17 (×2): 1 via ORAL
  Filled 2016-09-17: qty 1

## 2016-09-17 NOTE — Telephone Encounter (Signed)
Mena Goes, RN  P [facility] Admin Pool      Previous Messages    ----- Message -----  From: Ulyses Amor, PA-C  Sent: 09/16/2016  7:33 AM  To: [facility] Charge Pool   S/P right radiocephalic fistula f/u in 4-6 weeks with Dr. Donnetta Hutching

## 2016-09-17 NOTE — Progress Notes (Signed)
Patient ID: Melody [name], female   DOB: 20-Feb-1976, 41 y.o.   MRN: [id number]  PROGRESS NOTE    Melody [name]  GGY:[id number] DOB: 05-04-1976 DOA: 09/12/2016  PCP: Minerva Ends, MD   Brief Narrative:  41 y.o. woman with past medical history significant for HTN, smoker, CKD stage 3 (Cr in 2016 was 1.69). She presented to [facility] with 1 week history of headache, abdominal pain and occasional chest pain.  She also had some shortness of breath over the last 1 week PTA especially when she would lay down. Her SBP on admission was in 250 range and her Cr was 16. She has not noticed any decrease in urine output or change in urine characteristics.  She has not had any lower extremity edema.   She was subsequently transferred to [facility] due to renal failure. She also had profound anemia thought to be due to renal failure.   Seen by vascular for HD access. The first HD was 1/22.  Patient weaned off of fentanyl drip on 1/21 & nitroglycerin drip on 1/20.  TRH continued to follow from PCCM starting 1/23.   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 Current BP is 155/101 - Added hydralazine PRN for BP above 150/90  Acute respiratory failure with hypoxia / Metabolic acidosis - CXR 0/35 showed enlargement of cardiac silhouette with pulmonary vascular congestion and new severe diffuse bilateral airspace infiltrates consistent with pulmonary edema question fluid overload versus CHF.  Cannot exclude coexistent atelectasis versus consolidation in left lower lobe.  - Subsequently improved to 100%  Hypokalemia - Will be corrected with hemodialysis  - Magnesium on high side, 2.5 this am   Chronic diastolic congestive heart failure - ECHO 09/13/16 showed grade 1 DD with preserved EF; mild to moderate MR - Stable respiratory status   Tobacco abuse - Counseled on cessation - Has nicotine patch ordered    DVT prophylaxis: SCD's bilaterally  Code Status: full code  Family Communication: no family at the bedside this am Disposition Plan: home once cleared by renal   Consultants:   Vascular surgery for HD access  PCCM - for hypertensive urgency   Nephrology   Procedures:  TTE 09/13/16:  LV normal in size with moderate concentric hypertrophy. EF 55-60% with normal wall motion. Grade 1 diastolic dysfunction. LA moderately dilated & RA normal in size. RV normal in size and function. Mild to moderate aortic regurgitation without stenosis. Aortic root normal in size. Moderate mitral regurgitation without stenosis. Trivial pulmonic regurgitation without stenosis. No tricuspid regurgitation. No pericardial effusion.  RENAL U/S 09/13/16:  No hydronephrosis. Echogenic liver normal size kidneys bilaterally.  Antimicrobials:   None   Subjective: Feels okay.  Objective: Vitals:   09/16/16 2058 09/17/16 0500 09/17/16 0529 09/17/16 0620  BP: (!) 169/103 (!) 175/112 (!) 173/112 (!) 155/101  Pulse: 93 85    Resp: 18 18    Temp: 98.7 F (37.1  C) 98 F (36.7 C)    TempSrc: Oral Oral    SpO2: 100% 100%    Weight:      Height:        Intake/Output Summary (Last 24 hours) at 09/17/16 1024 Last data filed at 09/16/16 1620  Gross per 24 hour  Intake                0 ml  Output             1500 ml  Net            -1500 ml   Filed Weights   09/16/16 0335 09/16/16 1346 09/16/16 1620  Weight: 63.5 kg (139 lb 14.4 oz) 63 kg (138 lb 14.2 oz) 61.1 kg (134 lb 11.2 oz)     Examination:  General exam: Appears calm and comfortable, no distress  Respiratory system: CNo wheezing, no rhonchi  Cardiovascular system: S1 & S2 heard, Rate controlled  Gastrointestinal system: A(+) BS, non tender  Central nervous system: No focal neurological deficits. Extremities: Symmetric 5 x 5 power. Palpable pulses  Skin: Skin is warm and dry  Psychiatry: Mood & affect normal  Data Reviewed: I have personally reviewed following labs and imaging studies  CBC:  Recent Labs Lab 09/13/16 0204 09/14/16 0709 09/15/16 0301 09/16/16 0406 09/17/16 0253  WBC 6.8 9.0 7.9 8.1 7.8  NEUTROABS  --  6.9 5.5 6.4 5.3  HGB 7.4* 6.9* 7.1* 7.1* 7.0*  HCT 20.5* 19.1* 19.8* 19.9* 20.1*  MCV 88.7 89.3 91.2 91.3 93.5  PLT 81* 130* 171 160 338   Basic Metabolic Panel:  Recent Labs Lab 09/13/16 0204 09/14/16 0317 09/15/16 0301 09/16/16 0406 09/17/16 0253  NA 128* 129* 130* 132* 136  K 3.2* 3.3* 3.2* 2.9* 3.3*  CL 99* 97* 96* 97* 97*  CO2 13* 13* 14* 20* 26  GLUCOSE 219* 129* 106* 100* 100*  BUN 103* 122* 126* 64* 35*  CREATININE 16.05* 16.29* 18.36* 11.60* 8.49*  CALCIUM 8.7* 8.4* 8.4* 8.3* 8.8*  MG  --  3.0* 3.0* 2.4 2.5*  PHOS 8.2* 10.3* 10.6* 6.4* 6.5*   GFR: Estimated Creatinine Clearance: 8.1 mL/min (by C-G formula based on SCr of 8.49 mg/dL (H)). Liver Function Tests:  Recent Labs Lab 09/13/16 0204 09/16/16 0406 09/17/16 0253  AST 17  --   --   ALT 17  --   --   ALKPHOS 42  --   --   BILITOT 0.6  --   --   PROT 5.9*  --   --   ALBUMIN 3.3* 3.1* 2.9*   No results for input(s): LIPASE, AMYLASE in the last 168 hours. No results for input(s): AMMONIA in the last 168 hours. Coagulation Profile: No results for input(s): INR, PROTIME in the last 168 hours. Cardiac Enzymes:  Recent Labs Lab 09/13/16 0204 09/13/16 0731 09/13/16 1510  TROPONINI 0.33* 0.24* 0.19*   BNP (last 3 results) No results for input(s): PROBNP in the last 8760 hours. HbA1C: No  results for input(s): HGBA1C in the last 72 hours. CBG: No results for input(s): GLUCAP in the last 168 hours. Lipid Profile: No results for input(s): CHOL, HDL, LDLCALC, TRIG, CHOLHDL, LDLDIRECT in the last 72 hours. Thyroid Function Tests: No results for input(s): TSH, T4TOTAL, FREET4, T3FREE, THYROIDAB in the last 72 hours. Anemia Panel:  Recent Labs  09/16/16 1327  TIBC 225*  IRON 34   Urine analysis:    Component Value Date/Time   COLORURINE YELLOW 03/23/2016  [place] 03/23/2016 1411   LABSPEC 1.011 03/23/2016 1411   PHURINE 6.5 03/23/2016 1411   GLUCOSEU NEGATIVE 03/23/2016 1411   HGBUR NEGATIVE 03/23/2016 1411   BILIRUBINUR NEGATIVE 03/23/2016 1411   KETONESUR NEGATIVE 03/23/2016 1411   PROTEINUR NEGATIVE 03/23/2016 1411   UROBILINOGEN 1.0 01/17/2015 0935   NITRITE NEGATIVE 03/23/2016 1411   LEUKOCYTESUR NEGATIVE 03/23/2016 1411   Sepsis Labs: @LABRCNTIP (procalcitonin:4,lacticidven:4)  Recent Results (from the past 240 hour(s))  MRSA PCR Screening     Status: None   Collection Time: 09/13/16 12:59 AM  Result Value Ref Range Status   MRSA by PCR NEGATIVE NEGATIVE Final      Radiology Studies: Dg Chest 1 View Result Date: 09/13/2016 1. Stable mild cardiomegaly. 2. Near complete resolution of bilateral lung opacities, consistent with nearly resolved pulmonary edema.   US Renal Result Date: 09/13/2016 1. No hydronephrosis. 2. Echogenic normal size kidneys bilaterally, indicating renal parenchymal disease of uncertain chronicity. 3. Normal bladder. Electronically Signed   By: Ilona Sorrel M.D.   On: 09/13/2016 09:20   Dg Chest Port 1 View Result Date: 09/15/2016 Tip of RIGHT jugular line projects over the inferior RIGHT atrium. Enlargement of cardiac silhouette with pulmonary vascular congestion and new severe diffuse BILATERAL airspace infiltrates consistent with pulmonary edema question fluid overload versus CHF. Cannot exclude coexistent  atelectasis versus consolidation in LEFT lower lobe.    Scheduled Meds: . amLODipine  10 mg Oral Daily  . calcium acetate  1,334 mg Oral TID WC  . darbepoetin (ARANESP) injection - DIALYSIS  40 mcg Intravenous Q Tue-HD  . docusate sodium  100 mg Oral BID  . heparin  5,000 Units Subcutaneous Q8H  . labetalol  200 mg Oral BID  . multivitamin  1 tablet Oral QHS  . nicotine  7 mg Transdermal Daily  . sodium bicarbonate  650 mg Oral TID  . sodium chloride flush  3 mL Intravenous Q12H   Continuous Infusions: . sodium chloride 10 mL/hr (09/15/16 0939)     LOS: 5 days    Time spent: 25 minutes  Greater than 50% of the time spent on counseling and coordinating the care.   Leisa Lenz, MD [facility] Pager [phone]  If 7PM-7AM, please contact night-coverage [email or web address] Password [facility] 09/17/2016, 10:24 AM

## 2016-09-17 NOTE — Progress Notes (Signed)
Patient had 9 beat run of vtach, VS stable. Assymptomatic, MD notified. No new orders at this time, will continue to monitor patient.

## 2016-09-17 NOTE — Progress Notes (Deleted)
Subjective:  No UOP overnight.  Feeling better and looking better overall.   Objective Vital signs in last 24 hours: Vitals:   09/17/16 0500 09/17/16 0529 09/17/16 0620 09/17/16 1225  BP: (!) 175/112 (!) 173/112 (!) 155/101 (!) 149/106  Pulse: 85   86  Resp: 18   18  Temp: 98 F (36.7 C)   98.5 F (36.9 C)  TempSrc: Oral   Oral  SpO2: 100%   98%  Weight:      Height:       Weight change: -1.9 kg (-4 lb 3 oz)  Physical Exam: General: post op lethargic  Heart: RRR  2/6 sem lsb , no rub or gallop Lungs: decr bs  Bases, otherwise CTA  , non labored breathing  Abdomen: bs POS, SOFT ,NT, ND Extremities:  bilat trace pedal edema Dialysis Access:  Just post op = R IJ perm cath /pos. bruit R FA AVF sites stable   Problem/Plan:   1. New ESRD - prob due to malignant HTN. Long hx of severe HTN.  Starting CLIP process. 3rd HD today, then will go to MWF while here. No HD tomorrow.   2. HTN severe - Matilde Bash, cont norvasc, get vol down a bit 3. Anemia of CKD/ fe deg - started ESA and will start IV iron load 4. Secondary hyperparathyroidism - Phos 10.6  satrt  Phoslo as Binder  Corec ca 9.3 / check PTH level   Kelly Splinter MD [employer] pgr [phone]   09/17/2016, 12:47 PM   Labs: Basic Metabolic Panel:  Recent Labs Lab 09/15/16 0301 09/16/16 0406 09/17/16 0253  NA 130* 132* 136  K 3.2* 2.9* 3.3*  CL 96* 97* 97*  CO2 14* 20* 26  GLUCOSE 106* 100* 100*  BUN 126* 64* 35*  CREATININE 18.36* 11.60* 8.49*  CALCIUM 8.4* 8.3* 8.8*  PHOS 10.6* 6.4* 6.5*   Liver Function Tests:  Recent Labs Lab 09/13/16 0204 09/16/16 0406 09/17/16 0253  AST 17  --   --   ALT 17  --   --   ALKPHOS 42  --   --   BILITOT 0.6  --   --   PROT 5.9*  --   --   ALBUMIN 3.3* 3.1* 2.9*   CBC:  Recent Labs Lab 09/13/16 0204  09/14/16 0709 09/15/16 0301 09/16/16 0406 09/17/16 0253  WBC 6.8  --  9.0 7.9 8.1 7.8  NEUTROABS  --   < > 6.9 5.5 6.4 5.3  HGB 7.4*  --   6.9* 7.1* 7.1* 7.0*  HCT 20.5*  --  19.1* 19.8* 19.9* 20.1*  MCV 88.7  --  89.3 91.2 91.3 93.5  PLT 81*  --  130* 171 160 169  < > = values in this interval not displayed. Cardiac Enzymes:  Recent Labs Lab 09/13/16 0204 09/13/16 0731 09/13/16 1510  TROPONINI 0.33* 0.24* 0.19*   CBG: No results for input(s): GLUCAP in the last 168 hours.  Studies/Results: Dg Chest Port 1 View  Result Date: 09/15/2016 CLINICAL DATA:  Post AV fistula and dialysis catheter placement EXAM: PORTABLE CHEST 1 VIEW COMPARISON:  Portable exam 1427 hours compared to 09/13/2016 FINDINGS: New LEFT jugular central venous catheter with tip projecting over the inferior RIGHT atrium. Enlargement of cardiac silhouette with pulmonary vascular congestion. New diffuse BILATERAL pulmonary infiltrates compatible with pulmonary edema. Cannot exclude atelectasis versus consolidation in densely opacified LEFT lower lobe. No pneumothorax. Bones unremarkable. IMPRESSION: Tip of RIGHT jugular line projects  over the inferior RIGHT atrium. Enlargement of cardiac silhouette with pulmonary vascular congestion and new severe diffuse BILATERAL airspace infiltrates consistent with pulmonary edema question fluid overload versus CHF. Cannot exclude coexistent atelectasis versus consolidation in LEFT lower lobe. Electronically Signed   By: Lavonia Dana M.D.   On: 09/15/2016 14:41   Medications:  . amLODipine  10 mg Oral Daily  . calcium acetate  1,334 mg Oral TID WC  . [START ON 09/23/2016] darbepoetin (ARANESP) injection - DIALYSIS  60 mcg Intravenous Q Tue-HD  . docusate sodium  100 mg Oral BID  . ferric gluconate (FERRLECIT/NULECIT) IV  125 mg Intravenous Daily  . heparin  5,000 Units Subcutaneous Q8H  . hydrALAZINE  50 mg Oral Q8H  . labetalol  200 mg Oral BID  . multivitamin  1 tablet Oral QHS  . nicotine  7 mg Transdermal Daily

## 2016-09-17 NOTE — Telephone Encounter (Signed)
-----   Message from Mena Goes, RN sent at 09/16/2016  9:44 AM EST ----- Regarding: duplex added   ----- Message ----- From: Ulyses Amor, PA-C Sent: 09/16/2016   7:33 AM To: Vvs Charge Pool  Needs fistula duplex 4-6 weeks visit with Dr. Donnetta Hutching

## 2016-09-17 NOTE — Telephone Encounter (Signed)
tried to tell family at designated # about appt but they did not want to take the mssg, stated her cell # don't work either, mailing a letter to her home address, they confirmed that was good. 2/27 [place] and PO

## 2016-09-17 NOTE — Progress Notes (Signed)
Subjective:  No UOP overnight.  Feeling better and looking better overall.   Objective Vital signs in last 24 hours: Vitals:   09/17/16 0529 09/17/16 0620 09/17/16 1225 09/17/16 1434  BP: (!) 173/112 (!) 155/101 (!) 149/106 (!) 144/89  Pulse:   86 86  Resp:   18 18  Temp:   98.5 F (36.9 C) 98.8 F (37.1 C)  TempSrc:   Oral Oral  SpO2:   98% 98%  Weight:      Height:       Weight change: -1.9 kg (-4 lb 3 oz)  Physical Exam: General: post op lethargic  Heart: RRR  2/6 sem lsb , no rub or gallop Lungs: decr bs  Bases, otherwise CTA  , non labored breathing  Abdomen: bs POS, SOFT ,NT, ND Extremities:  bilat trace pedal edema Dialysis Access:  Just post op = R IJ perm cath /pos. bruit R FA AVF sites stable   Problem/Plan:   1. New ESRD - prob due to malignant HTN. Long hx of severe HTN.  Starting CLIP process. 3rd HD today, then will go to MWF while here. No HD tomorrow.   2. HTN severe - Matilde Bash, cont norvasc, get vol down a bit 3. Anemia of CKD/ fe deg - started ESA and will start IV iron load 4. Secondary hyperparathyroidism - Phos 10.6  satrt  Phoslo as Binder  Corec ca 9.3 / check PTH level   Kelly Splinter MD [employer] pgr [phone]   09/17/2016, 2:57 PM   Labs: Basic Metabolic Panel:  Recent Labs Lab 09/15/16 0301 09/16/16 0406 09/16/16 1327 09/17/16 0253  NA 130* 132*  --  136  K 3.2* 2.9*  --  3.3*  CL 96* 97*  --  97*  CO2 14* 20*  --  26  GLUCOSE 106* 100*  --  100*  BUN 126* 64*  --  35*  CREATININE 18.36* 11.60*  --  8.49*  CALCIUM 8.4* 8.3* 8.1* 8.8*  PHOS 10.6* 6.4*  --  6.5*   Liver Function Tests:  Recent Labs Lab 09/13/16 0204 09/16/16 0406 09/17/16 0253  AST 17  --   --   ALT 17  --   --   ALKPHOS 42  --   --   BILITOT 0.6  --   --   PROT 5.9*  --   --   ALBUMIN 3.3* 3.1* 2.9*   CBC:  Recent Labs Lab 09/13/16 0204  09/14/16 0709 09/15/16 0301 09/16/16 0406 09/17/16 0253  WBC 6.8  --  9.0 7.9 8.1  7.8  NEUTROABS  --   < > 6.9 5.5 6.4 5.3  HGB 7.4*  --  6.9* 7.1* 7.1* 7.0*  HCT 20.5*  --  19.1* 19.8* 19.9* 20.1*  MCV 88.7  --  89.3 91.2 91.3 93.5  PLT 81*  --  130* 171 160 169  < > = values in this interval not displayed. Cardiac Enzymes:  Recent Labs Lab 09/13/16 0204 09/13/16 0731 09/13/16 1510  TROPONINI 0.33* 0.24* 0.19*   CBG: No results for input(s): GLUCAP in the last 168 hours.  Studies/Results: No results found. Medications:  . amLODipine  10 mg Oral Daily  . calcium acetate  1,334 mg Oral TID WC  . [START ON 09/23/2016] darbepoetin (ARANESP) injection - DIALYSIS  60 mcg Intravenous Q Tue-HD  . docusate sodium  100 mg Oral BID  . ferric gluconate (FERRLECIT/NULECIT) IV  125 mg Intravenous Daily  .  heparin  5,000 Units Subcutaneous Q8H  . hydrALAZINE  50 mg Oral Q8H  . labetalol  200 mg Oral BID  . multivitamin  1 tablet Oral QHS  . nicotine  7 mg Transdermal Daily

## 2016-09-18 LAB — BASIC METABOLIC PANEL WITH GFR
Anion gap: 9 (ref 5–15)
BUN: 14 mg/dL (ref 6–20)
CO2: 28 mmol/L (ref 22–32)
Calcium: 8.9 mg/dL (ref 8.9–10.3)
Chloride: 100 mmol/L — ABNORMAL LOW (ref 101–111)
Creatinine, Ser: 5.92 mg/dL — ABNORMAL HIGH (ref 0.44–1.00)
GFR calc Af Amer: 9 mL/min — ABNORMAL LOW
GFR calc non Af Amer: 8 mL/min — ABNORMAL LOW
Glucose, Bld: 93 mg/dL (ref 65–99)
Potassium: 3.9 mmol/L (ref 3.5–5.1)
Sodium: 137 mmol/L (ref 135–145)

## 2016-09-18 LAB — URINALYSIS, ROUTINE W REFLEX MICROSCOPIC
BILIRUBIN URINE: NEGATIVE
GLUCOSE, UA: NEGATIVE mg/dL
KETONES UR: NEGATIVE mg/dL
NITRITE: NEGATIVE
PROTEIN: 100 mg/dL — AB
Specific Gravity, Urine: 1.013 (ref 1.005–1.030)
pH: 8 (ref 5.0–8.0)

## 2016-09-18 LAB — CBC
HCT: 22.3 % — ABNORMAL LOW (ref 36.0–46.0)
HEMOGLOBIN: 7.4 g/dL — AB (ref 12.0–15.0)
MCH: 32.7 pg (ref 26.0–34.0)
MCHC: 33.2 g/dL (ref 30.0–36.0)
MCV: 98.7 fL (ref 78.0–100.0)
Platelets: 207 10*3/uL (ref 150–400)
RBC: 2.26 MIL/uL — AB (ref 3.87–5.11)
RDW: 14.7 % (ref 11.5–15.5)
WBC: 10.7 10*3/uL — ABNORMAL HIGH (ref 4.0–10.5)

## 2016-09-18 MED ORDER — OXYCODONE-ACETAMINOPHEN 5-325 MG PO TABS
1.0000 | ORAL_TABLET | ORAL | Status: DC | PRN
Start: 2016-09-18 — End: 2016-09-19
  Administered 2016-09-18: 1 via ORAL
  Filled 2016-09-18: qty 1

## 2016-09-18 MED ORDER — LISINOPRIL 10 MG PO TABS
10.0000 mg | ORAL_TABLET | Freq: Every day | ORAL | Status: DC
  Administered 2016-09-18: 10 mg via ORAL
  Filled 2016-09-18: qty 1

## 2016-09-18 NOTE — Progress Notes (Signed)
Subjective:  No distress  Objective Vital signs in last 24 hours: Vitals:   09/17/16 1730 09/17/16 1757 09/17/16 2043 09/18/16 0436  BP: (!) 149/102 (!) 151/97 (!) 171/108 (!) 156/115  Pulse: 83 91 96 86  Resp:  15 18 18   Temp:  98.1 F (36.7 C) 98.9 F (37.2 C) 98.2 F (36.8 C)  TempSrc:  Oral Oral Oral  SpO2:  96% 100% 95%  Weight:    60.6 kg (133 lb 8 oz)  Height:       Weight change: -2.445 kg (-5 lb 6.2 oz)  Physical Exam: General: post op lethargic  Heart: RRR  2/6 sem lsb , no rub or gallop Lungs: decr bs  Bases, otherwise CTA  , non labored breathing  Abdomen: bs POS, SOFT ,NT, ND Extremities:  bilat trace pedal edema Dialysis Access:  Just post op = R IJ perm cath /pos. bruit R FA AVF sites stable   Problems: 1. New ESRD - prob due to malignant HTN. Long hx of severe HTN.  Starting CLIP process. HD tomorrow.  Working on getting into outpt unit. Anticipate possible dc tomorrow or Sat am.  2. HTN severe - on hydral/ labet/ norvasc, still BP's high, will try ACEi 3. Anemia of CKD/ fe deg - started ESA and started IV iron load 4. Secondary hyperparathyroidism - Phos 10.6  satrt  Phoslo as Binder  Corec ca 9.3 / check PTH level   Plan - as above   Kelly Splinter MD [employer] pgr [phone]   09/18/2016, 1:14 PM   Labs: Basic Metabolic Panel:  Recent Labs Lab 09/15/16 0301 09/16/16 0406 09/16/16 1327 09/17/16 0253 09/18/16 0341  NA 130* 132*  --  136 137  K 3.2* 2.9*  --  3.3* 3.9  CL 96* 97*  --  97* 100*  CO2 14* 20*  --  26 28  GLUCOSE 106* 100*  --  100* 93  BUN 126* 64*  --  35* 14  CREATININE 18.36* 11.60*  --  8.49* 5.92*  CALCIUM 8.4* 8.3* 8.1* 8.8* 8.9  PHOS 10.6* 6.4*  --  6.5*  --    Liver Function Tests:  Recent Labs Lab 09/13/16 0204 09/16/16 0406 09/17/16 0253  AST 17  --   --   ALT 17  --   --   ALKPHOS 42  --   --   BILITOT 0.6  --   --   PROT 5.9*  --   --   ALBUMIN 3.3* 3.1* 2.9*   CBC:  Recent  Labs Lab 09/14/16 0709 09/15/16 0301 09/16/16 0406 09/17/16 0253 09/18/16 0341  WBC 9.0 7.9 8.1 7.8 10.7*  NEUTROABS 6.9 5.5 6.4 5.3  --   HGB 6.9* 7.1* 7.1* 7.0* 7.4*  HCT 19.1* 19.8* 19.9* 20.1* 22.3*  MCV 89.3 91.2 91.3 93.5 98.7  PLT 130* 171 160 169 207   Cardiac Enzymes:  Recent Labs Lab 09/13/16 0204 09/13/16 0731 09/13/16 1510  TROPONINI 0.33* 0.24* 0.19*   CBG: No results for input(s): GLUCAP in the last 168 hours.  Studies/Results: No results found. Medications:  . amLODipine  10 mg Oral Daily  . calcium acetate  1,334 mg Oral TID WC  . [START ON 09/23/2016] darbepoetin (ARANESP) injection - DIALYSIS  60 mcg Intravenous Q Tue-HD  . docusate sodium  100 mg Oral BID  . ferric gluconate (FERRLECIT/NULECIT) IV  125 mg Intravenous Daily  . heparin  5,000 Units Subcutaneous Q8H  . hydrALAZINE  50 mg Oral Q8H  . labetalol  200 mg Oral BID  . lisinopril  10 mg Oral Daily  . multivitamin  1 tablet Oral QHS  . nicotine  7 mg Transdermal Daily

## 2016-09-18 NOTE — Progress Notes (Signed)
Pt C/O left side abd pain rating 7/10. Pt stated current ordered Hydrocodone makes her have bad dreams. Pt requested oxycodone. [facility] notified. Will continue to monitor. Isac Caddy, RN

## 2016-09-18 NOTE — Progress Notes (Signed)
PROGRESS NOTE    Melody [name]  FUX:[id number] DOB: 07/13/1976 DOA: 09/12/2016 PCP: Minerva Ends, MD   Brief Narrative: 41 y.o.woman with past medical history significant for HTN, smoker, CKD stage 3 (Cr in 2016 was 1.69). She presented to [facility] with 1 week history of headache, abdominal pain and occasional chest pain.  She also had some shortness of breath over the last 1 week PTA especially when she would lay down. Her SBP on admission was in 250 range and her Cr was 16. She has not noticed any decrease in urine output or change in urine characteristics. She has not had any lower extremity edema.  She was subsequently transferred to [facility] due to renal failure. She also had profound anemia thought to be due to renal failure. Seen by vascular for HD access. The first HD was 1/22. Patient weaned off of fentanyl drip on 1/21 & nitroglycerin drip on 1/20.   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 Patient has a right IJ tunneled catheter and right upper extremity AV fistula. The fistula has good thrill. Nephrology consult appreciated. Outpatient hemodialysis center/set up evaluation ongoing.  # Secondary hyperparathyroidism - Continue phoslo. Educated on low 4*8.  # Anemia of chronic kidney failure  -Monitor CBC. Treatment with iron and ESA as per nephrologist during dialysis. No sign of bleeding.  # Accelerated hypertension: Partly volume mediated. Patient is currently on amlodipine, hydralazine, labetalol, lisinopril. . Monitor blood pressure closely. Also receiving hemodialysis treatment which should gradually help to improve blood pressure.  # Acute respiratory failure with hypoxia - improved now. On room air today.  # Chronic diastolic congestive heart failure - ECHO 09/13/16 showed grade 1 DD with preserved EF; mild to moderate MR - Stable respiratory  status   # Tobacco abuse - Counseled on cessation - Has nicotine patch ordered   Active Problems:   Hypertensive emergency   Hypertensive urgency   Acute renal failure (ARF) (HCC)   Acute respiratory failure with hypoxia (HCC)   Anemia of chronic disease   Thrombocytopenia (HCC)  DVT prophylaxis: Heparin subcutaneous Code Status: Full code Family Communication: No family present at bedside Disposition Plan: Likely discharge home in 1-2 days.  Consultants:    Vascular surgery for HD access  PCCM - for hypertensive urgency   Nephrology  Procedures:  TTE 09/13/16: LV normal in size with moderate concentric hypertrophy. EF 55-60%with normal wall motion. Grade 1 diastolic dysfunction. LA moderately dilated &RA normal in size. RV normal in size and function. Mild to moderate aortic regurgitation without stenosis. Aortic root normal in size. Moderate mitral regurgitation without stenosis. Trivial pulmonic regurgitation without stenosis. No tricuspid regurgitation. No pericardial effusion.  RENAL U/S 09/13/16:No hydronephrosis. Echogenic liver normal size kidneys bilaterally Antimicrobials: None Subjective: Patient was seen and examined at bedside. She reported feeling better. Denied headache, dizziness, nausea, vomiting, chest pain or shortness of breath.  Objective: Vitals:   09/17/16 1730 09/17/16 1757 09/17/16 2043 09/18/16 0436  BP: (!) 149/102 (!) 151/97 (!) 171/108 (!) 156/115  Pulse: 83 91 96 86  Resp:  15 18 18   Temp:  98.1 F (36.7 C) 98.9 F (37.2 C) 98.2 F (36.8 C)  TempSrc:  Oral Oral Oral  SpO2:  96% 100% 95%  Weight:    60.6 kg (133 lb 8 oz)  Height:  Intake/Output Summary (Last 24 hours) at 09/18/16 1300 Last data filed at 09/18/16 0700  Gross per 24 hour  Intake              466 ml  Output             1501 ml  Net            -1035 ml   Filed Weights   09/16/16 1346 09/16/16 1620 09/18/16 0436  Weight: 63 kg (138 lb 14.2 oz) 61.1 kg  (134 lb 11.2 oz) 60.6 kg (133 lb 8 oz)    Examination:  General exam: Appears calm and comfortable  Respiratory system: Clear to auscultation. Respiratory effort normal. No wheezing or crackle. Right IJ tunneled catheter site with no bleeding. Cardiovascular system: S1 & S2 heard, RRR.  No pedal edema. Gastrointestinal system: Abdomen is nondistended, soft and nontender. Normal bowel sounds heard. Central nervous system: Alert and oriented. No focal neurological deficits. Extremities: No lower extremity edema. Right upper extremity has AV fistula with good thrill. Skin: No rashes, lesions or ulcers Psychiatry: Judgement and insight appear normal. Mood & affect appropriate.     Data Reviewed: I have personally reviewed following labs and imaging studies  CBC:  Recent Labs Lab 09/14/16 0709 09/15/16 0301 09/16/16 0406 09/17/16 0253 09/18/16 0341  WBC 9.0 7.9 8.1 7.8 10.7*  NEUTROABS 6.9 5.5 6.4 5.3  --   HGB 6.9* 7.1* 7.1* 7.0* 7.4*  HCT 19.1* 19.8* 19.9* 20.1* 22.3*  MCV 89.3 91.2 91.3 93.5 98.7  PLT 130* 171 160 169 203   Basic Metabolic Panel:  Recent Labs Lab 09/13/16 0204 09/14/16 0317 09/15/16 0301 09/16/16 0406 09/16/16 1327 09/17/16 0253 09/18/16 0341  NA 128* 129* 130* 132*  --  136 137  K 3.2* 3.3* 3.2* 2.9*  --  3.3* 3.9  CL 99* 97* 96* 97*  --  97* 100*  CO2 13* 13* 14* 20*  --  26 28  GLUCOSE 219* 129* 106* 100*  --  100* 93  BUN 103* 122* 126* 64*  --  35* 14  CREATININE 16.05* 16.29* 18.36* 11.60*  --  8.49* 5.92*  CALCIUM 8.7* 8.4* 8.4* 8.3* 8.1* 8.8* 8.9  MG  --  3.0* 3.0* 2.4  --  2.5*  --   PHOS 8.2* 10.3* 10.6* 6.4*  --  6.5*  --    GFR: Estimated Creatinine Clearance: 11.6 mL/min (by C-G formula based on SCr of 5.92 mg/dL (H)). Liver Function Tests:  Recent Labs Lab 09/13/16 0204 09/16/16 0406 09/17/16 0253  AST 17  --   --   ALT 17  --   --   ALKPHOS 42  --   --   BILITOT 0.6  --   --   PROT 5.9*  --   --   ALBUMIN 3.3* 3.1*  2.9*   No results for input(s): LIPASE, AMYLASE in the last 168 hours. No results for input(s): AMMONIA in the last 168 hours. Coagulation Profile: No results for input(s): INR, PROTIME in the last 168 hours. Cardiac Enzymes:  Recent Labs Lab 09/13/16 0204 09/13/16 0731 09/13/16 1510  TROPONINI 0.33* 0.24* 0.19*   BNP (last 3 results) No results for input(s): PROBNP in the last 8760 hours. HbA1C: No results for input(s): HGBA1C in the last 72 hours. CBG: No results for input(s): GLUCAP in the last 168 hours. Lipid Profile: No results for input(s): CHOL, HDL, LDLCALC, TRIG, CHOLHDL, LDLDIRECT in the last 72 hours. Thyroid Function Tests:  No results for input(s): TSH, T4TOTAL, FREET4, T3FREE, THYROIDAB in the last 72 hours. Anemia Panel:  Recent Labs  09/16/16 1327  TIBC 225*  IRON 34   Sepsis Labs:  Recent Labs Lab 09/13/16 0204 09/13/16 0431  PROCALCITON 1.38  --   LATICACIDVEN 0.8 0.7    Recent Results (from the past 240 hour(s))  MRSA PCR Screening     Status: None   Collection Time: 09/13/16 12:59 AM  Result Value Ref Range Status   MRSA by PCR NEGATIVE NEGATIVE Final    Comment:        The GeneXpert MRSA Assay (FDA approved for NASAL specimens only), is one component of a comprehensive MRSA colonization surveillance program. It is not intended to diagnose MRSA infection nor to guide or monitor treatment for MRSA infections.          Radiology Studies: No results found.      Scheduled Meds: . amLODipine  10 mg Oral Daily  . calcium acetate  1,334 mg Oral TID WC  . [START ON 09/23/2016] darbepoetin (ARANESP) injection - DIALYSIS  60 mcg Intravenous Q Tue-HD  . docusate sodium  100 mg Oral BID  . ferric gluconate (FERRLECIT/NULECIT) IV  125 mg Intravenous Daily  . heparin  5,000 Units Subcutaneous Q8H  . hydrALAZINE  50 mg Oral Q8H  . labetalol  200 mg Oral BID  . lisinopril  10 mg Oral Daily  . multivitamin  1 tablet Oral QHS  .  nicotine  7 mg Transdermal Daily   Continuous Infusions:   LOS: 6 days    Dron Tanna Furry, MD [facility] Pager [phone]  If 7PM-7AM, please contact night-coverage [email or web address] Password [email or web address] 09/18/2016, 1:00 PM

## 2016-09-19 DIAGNOSIS — I160 Hypertensive urgency: Secondary | ICD-10-CM

## 2016-09-19 DIAGNOSIS — D696 Thrombocytopenia, unspecified: Secondary | ICD-10-CM

## 2016-09-19 DIAGNOSIS — J9601 Acute respiratory failure with hypoxia: Secondary | ICD-10-CM

## 2016-09-19 DIAGNOSIS — D638 Anemia in other chronic diseases classified elsewhere: Secondary | ICD-10-CM

## 2016-09-19 LAB — BASIC METABOLIC PANEL
ANION GAP: 10 (ref 5–15)
BUN: 32 mg/dL — AB (ref 6–20)
CHLORIDE: 100 mmol/L — AB (ref 101–111)
CO2: 26 mmol/L (ref 22–32)
Calcium: 9 mg/dL (ref 8.9–10.3)
Creatinine, Ser: 9 mg/dL — ABNORMAL HIGH (ref 0.44–1.00)
GFR, EST AFRICAN AMERICAN: 6 mL/min — AB (ref >60)
GFR, EST NON AFRICAN AMERICAN: 5 mL/min — AB (ref >60)
Glucose, Bld: 92 mg/dL (ref 65–99)
POTASSIUM: 4.1 mmol/L (ref 3.5–5.1)
SODIUM: 136 mmol/L (ref 135–145)

## 2016-09-19 LAB — CBC
HEMATOCRIT: 24.3 % — AB (ref 36.0–46.0)
HEMOGLOBIN: 7.8 g/dL — AB (ref 12.0–15.0)
MCH: 32.4 pg (ref 26.0–34.0)
MCHC: 32.1 g/dL (ref 30.0–36.0)
MCV: 100.8 fL — AB (ref 78.0–100.0)
Platelets: 220 10*3/uL (ref 150–400)
RBC: 2.41 MIL/uL — AB (ref 3.87–5.11)
RDW: 14.6 % (ref 11.5–15.5)
WBC: 11.4 10*3/uL — ABNORMAL HIGH (ref 4.0–10.5)

## 2016-09-19 MED ORDER — LISINOPRIL 10 MG PO TABS: 10.0000 mg | ORAL_TABLET | Freq: Every day | ORAL | Status: DC

## 2016-09-19 MED ORDER — AMLODIPINE BESYLATE 10 MG PO TABS: 10.0000 mg | ORAL_TABLET | Freq: Every day | ORAL | Status: DC

## 2016-09-19 MED ORDER — LISINOPRIL 10 MG PO TABS: 10.0000 mg | ORAL_TABLET | Freq: Every day | ORAL | 0 refills | Status: DC

## 2016-09-19 MED ORDER — CALCIUM ACETATE (PHOS BINDER) 667 MG PO CAPS: 1334.0000 mg | ORAL_CAPSULE | Freq: Three times a day (TID) | ORAL | 0 refills | Status: DC

## 2016-09-19 MED ORDER — SORBITOL 70 % SOLN
60.0000 mL | Freq: Once | Status: AC
  Administered 2016-09-19: 60 mL via ORAL
  Filled 2016-09-19 (×2): qty 60

## 2016-09-19 MED ORDER — HYDRALAZINE HCL 50 MG PO TABS: 50.0000 mg | ORAL_TABLET | Freq: Three times a day (TID) | ORAL | 0 refills | Status: DC

## 2016-09-19 MED ORDER — DARBEPOETIN ALFA 60 MCG/0.3ML IJ SOSY: 60.0000 ug | PREFILLED_SYRINGE | INTRAMUSCULAR | Status: DC

## 2016-09-19 MED ORDER — LABETALOL HCL 200 MG PO TABS: 200.0000 mg | ORAL_TABLET | Freq: Two times a day (BID) | ORAL | 0 refills | Status: DC

## 2016-09-19 MED ORDER — AMLODIPINE BESYLATE 10 MG PO TABS: 10.0000 mg | ORAL_TABLET | Freq: Every day | ORAL | 0 refills | Status: DC

## 2016-09-19 MED FILL — LABETALOL HCL 200 MG TABLET: 200 | 30 days supply | Qty: 60 | Fill #0

## 2016-09-19 MED FILL — CALCIUM ACETATE 667 MG CAP: 667 | 30 days supply | Qty: 180 | Fill #0

## 2016-09-19 MED FILL — hydrALAZINE HCL 50 MG TABS: 50 | 30 days supply | Qty: 90 | Fill #0

## 2016-09-19 MED FILL — AMLODIPINE BESYLATE 10 MG T: 10 | 30 days supply | Qty: 30 | Fill #0

## 2016-09-19 MED FILL — LISINOPRIL 10 MG TABLET: 10 | 30 days supply | Qty: 30 | Fill #0

## 2016-09-19 NOTE — Care Management Note (Signed)
Case Management Note Marvetta Gibbons RN, BSN Unit 2W-Case Manager [phone]  Patient Details  Name: Melody [name] MRN: [id number] Date of Birth: [birth year]  Subjective/Objective:  Pt admitted with HTN emergency-  RF- now ESRD- with perm cath placed 1/22- and first HD tx started-   Pt tx from ICU to 2W on 09/15/16                Action/Plan: PTA pt lived at home- was independent- has mom and sister as support- PCP- [place]- [place] with pt at bedside for questions relating to FMLA/disability- pt does not work- so FMLA is not applicable- pt to f/u with Dept. Of SS regarding disability- to see if she may potentially be eligible.  Pt is in process of being clipped for outpt HD center per renal MD note.  CM to follow for any further d/c needs.   Expected Discharge Date:   09/19/16             Expected Discharge Plan:  Home/Self Care  In-House Referral:     Discharge planning Services  CM Consult  Post Acute Care Choice:    Choice offered to:     DME Arranged:    DME Agency:     HH Arranged:    HH Agency:     Status of Service:  Completed, signed off  If discussed at [employer] of Stay Meetings, dates discussed:    Discharge Disposition: home/self care   Additional Comments:  09/19/16- 1140- [name] rN, CM- pt to be discharged today- has been clipped to outpt HD center and accepted to spot at [facility] for TTS- will start there on 1/27 - needs to be there at 2 pm to sign paperwork per renal note.   Dawayne Patricia, RN 09/19/2016, 11:40 AM

## 2016-09-19 NOTE — Progress Notes (Signed)
[facility] Progress Note   Dialysis Orders: new start accepted [facility] TTS  Assessment/Plan: 1. Acute resp failure with hypoxia - improved with volume removal  2. ESRD -new start to HD presumed secondary to HTN K 4.1 - use 4 K bath 3. Anemia - hgb 7.8 Aranesp 60 given 1/23 - has received 3 doses 125 IV Fe so far hgb trending up without transfusion 4. Secondary hyperparathyroidism - iPTH 301 - P still high - on 2 phoslo - may need non Ca binder;start hectorol 1 5. HTN uregency/volume - titrating volume and meds BP better today - change norvasc and lisinopril  to HS goal 2 L today 6. Nutrition - renal diet/vits- to receive additional education at discharge 7. Tobacco abuse - on patch - encouraged to continue at d/c 8. Disp - hopefully d/c today and start at outpt unit tomorrow --needs to be at [facility] but 2 pm to sign papers today  Myriam Jacobson, PA-C [place] 09/19/2016,8:57 AM  LOS: 7 days   Pt seen, examined and agree w A/P as above.  Kelly Splinter MD [facility] pager [phone]   09/19/2016, 12:56 PM    Subjective:   No c/o  Objective Vitals:   09/19/16 0713 09/19/16 0730 09/19/16 0800 09/19/16 0830  BP: 134/90 (!) 131/93 131/85 120/78  Pulse: 76 77 81 78  Resp: 15     Temp:      TempSrc:      SpO2:      Weight:      Height:       Physical Exam General: alert and talkative Heart: RRR Lungs: mp rales Abdomen:soft NT Extremities:tr LE edema Dialysis Access: right IJ and right lower AVF + bruit   Additional Objective Labs: Basic Metabolic Panel:  Recent Labs Lab 09/15/16 0301 09/16/16 0406  09/17/16 0253 09/18/16 0341 09/19/16 0257  NA 130* 132*  --  136 137 136  K 3.2* 2.9*  --  3.3* 3.9 4.1  CL 96* 97*  --  97* 100* 100*  CO2 14* 20*  --  26 28 26   GLUCOSE 106* 100*  --  100* 93 92  BUN 126* 64*  --  35* 14 32*  CREATININE 18.36* 11.60*  --  8.49* 5.92* 9.00*  CALCIUM 8.4* 8.3*  < >  8.8* 8.9 9.0  PHOS 10.6* 6.4*  --  6.5*  --   --   < > = values in this interval not displayed. Liver Function Tests:  Recent Labs Lab 09/13/16 0204 09/16/16 0406 09/17/16 0253  AST 17  --   --   ALT 17  --   --   ALKPHOS 42  --   --   BILITOT 0.6  --   --   PROT 5.9*  --   --   ALBUMIN 3.3* 3.1* 2.9*   No results for input(s): LIPASE, AMYLASE in the last 168 hours. CBC:  Recent Labs Lab 09/15/16 0301 09/16/16 0406 09/17/16 0253 09/18/16 0341 09/19/16 0257  WBC 7.9 8.1 7.8 10.7* 11.4*  NEUTROABS 5.5 6.4 5.3  --   --   HGB 7.1* 7.1* 7.0* 7.4* 7.8*  HCT 19.8* 19.9* 20.1* 22.3* 24.3*  MCV 91.2 91.3 93.5 98.7 100.8*  PLT 171 160 169 207 220   Cardiac Enzymes:  Recent Labs Lab 09/13/16 0204 09/13/16 0731 09/13/16 1510  TROPONINI 0.33* 0.24* 0.19*   CBG: No results for input(s): GLUCAP in the last 168 hours. Iron Studies:  Recent Labs  09/16/16 1327  IRON 34  TIBC 225*   No results found for: INR, PROTIME Studies/Results: No results found. Medications:  . amLODipine  10 mg Oral Daily  . calcium acetate  1,334 mg Oral TID WC  . [START ON 09/23/2016] darbepoetin (ARANESP) injection - DIALYSIS  60 mcg Intravenous Q Tue-HD  . docusate sodium  100 mg Oral BID  . ferric gluconate (FERRLECIT/NULECIT) IV  125 mg Intravenous Daily  . heparin  5,000 Units Subcutaneous Q8H  . hydrALAZINE  50 mg Oral Q8H  . labetalol  200 mg Oral BID  . lisinopril  10 mg Oral Daily  . multivitamin  1 tablet Oral QHS  . nicotine  7 mg Transdermal Daily

## 2016-09-19 NOTE — Progress Notes (Signed)
Accepted at [facility] .Start Date and Time Sat.Jan.27th 2nd shift.Pt. Will need to sign paperwork on Fri.by 2:00pm and will get her chairtime at that time .Pending on Nephology approval      T-T-S 2nd shift

## 2016-09-19 NOTE — Discharge Summary (Addendum)
Physician Discharge Summary  Melody [name] KDX:[id number] DOB: 1975/12/18 DOA: 09/12/2016  PCP: Minerva Ends, MD  Admit date: 09/12/2016 Discharge date: 09/19/2016  Admitted From: Home Disposition: Home  Recommendations for Outpatient Follow-up:  1. Follow up with PCP in 1-2 weeks 2. Please obtain BMP/CBC in one week   Home Health: NA Equipment/Devices:NA  Discharge Condition: Stable CODE STATUS: Full Code Diet recommendation: Diet renal/carb modified with fluid restriction Diet-HS Snack? Nothing; Room service appropriate? Yes; Fluid consistency: Thin Diet - low sodium heart healthy  Brief/Interim Summary: 41 y.o.woman with past medical history significant for HTN, smoker, CKD stage 3 (Cr in 2016 was 1.69). She presented to [facility] with 1 week history of headache, abdominal pain and occasional chest pain. She also had some shortness of breath over the last 1 week PTA especially when she would lay down. Her SBP on admission was in 250 range and her Cr was 16. She has not noticed any decrease in urine output or change in urine characteristics. She has not had any lower extremity edema.  She was subsequently transferred to [facility] due to renal failure. She also had profound anemia thought to be due to renal failure. Seen by vascular for HD access. The first HD was 1/22. Patient weaned off of fentanyl drip on 1/21 & nitroglycerin drip on 1/20.   Discharge Diagnoses:  Active Problems:   Hypertensive emergency   Hypertensive urgency   Acute renal failure (ARF) (HCC)   Acute respiratory failure with hypoxia (HCC)   Anemia of chronic disease   Thrombocytopenia ([place])   New ESRD requiring HD, Had rapid worsening of her renal function secondary to malignant hypertension - New ESRD felt to be due to malignant hypertension.  -Patient has a right IJ tunneled catheter and right upper extremity AV fistula.  -Patient is started on dialysis on 09/17/2016, today is her second  dialysis. -Accepted at dialysis at [facility] on Tuesday/Thursday/Saturday second shift.  Secondary hyperparathyroidism - Continue phoslo.    Anemia of chronic kidney failure -Monitor CBC. Treatment with iron and ESA as per nephrologist during dialysis. No sign of bleeding.  Accelerated hypertension -Partly volume mediated. Patient was on amlodipine and Coreg. -After the very first else is patient blood pressure improved dramatically. -Blood pressure is 139/91 on discharge. Discharged on Norvasc, Hydralazine, labetalol and lisinopril.  Acute respiratory failure with hypoxia -Oxygen saturation was 88% on room air, this is likely secondary to noncardiogenic pulmonary edema from fluid retention. -This is resolved.  Chronic diastolic congestive heart failure - ECHO 09/13/16 showed grade 1 DD with preserved EF; mild to moderate MR - Stable respiratory status   Tobacco abuse - Counseled on cessation - Has nicotine patch ordered   Discharge Instructions  Discharge Instructions    Diet - low sodium heart healthy    Complete by:  As directed    Increase activity slowly    Complete by:  As directed      Allergies as of 09/19/2016   No Known Allergies     Medication List    STOP taking these medications   carvedilol 12.5 MG tablet Commonly known as:  COREG   diphenhydramine-acetaminophen 25-500 MG Tabs tablet Commonly known as:  TYLENOL PM     TAKE these medications   amLODipine 10 MG tablet Commonly known as:  NORVASC Take 1 tablet (10 mg total) by mouth at bedtime. What changed:  when to take this   calcium acetate 667 MG capsule Commonly known as:  PHOSLO Take 2 capsules (1,334 mg total) by mouth 3 (three) times daily with meals.   Darbepoetin Alfa 60 MCG/0.3ML Sosy injection Commonly known as:  ARANESP Inject 0.3 mLs (60 mcg total) into the vein every Tuesday with hemodialysis. Start taking on:  09/23/2016   hydrALAZINE 50 MG tablet Commonly  known as:  APRESOLINE Take 1 tablet (50 mg total) by mouth every 8 (eight) hours. What changed:  medication strength  how much to take  when to take this   labetalol 200 MG tablet Commonly known as:  NORMODYNE Take 1 tablet (200 mg total) by mouth 2 (two) times daily.   lisinopril 10 MG tablet Commonly known as:  PRINIVIL,ZESTRIL Take 1 tablet (10 mg total) by mouth at bedtime.   simvastatin 10 MG tablet Commonly known as:  ZOCOR Take 1 tablet (10 mg total) by mouth at bedtime.      Follow-up Information    Early, Todd, MD Follow up in 6 week(s).   Specialties:  Vascular Surgery, Cardiology Why:  Office will call you to arrange your appt (sent) Contact information: [place] [address] [phone]          No Known Allergies  Consultations: Treatment Team:   Madelon Lips, MD nephrology   Procedures (Echo, Carotid, EGD, Colonoscopy, ERCP)   Radiological studies: Dg Chest 1 View  Result Date: 09/13/2016 CLINICAL DATA:  Respiratory failure EXAM: CHEST 1 VIEW COMPARISON:  Chest radiograph from one day prior. FINDINGS: Stable cardiomediastinal silhouette with mild cardiomegaly. No pneumothorax. No pleural effusion. Bilateral lung opacities have nearly resolved, with minimal residual right parahilar fluffy opacity. No acute consolidative airspace disease. IMPRESSION: 1. Stable mild cardiomegaly. 2. Near complete resolution of bilateral lung opacities, consistent with nearly resolved pulmonary edema. Electronically Signed   By: Ilona Sorrel M.D.   On: 09/13/2016 09:23   Dg Chest 2 View  Result Date: 09/12/2016 CLINICAL DATA:  Body ache, fevers, chest pain and cough. Dyspnea times 3-4 days. EXAM: CHEST  2 VIEW COMPARISON:  12/28/2012 FINDINGS: Cardiomediastinal contours are normal. Patchy airspace opacities consistent with pneumonia in the right upper, middle and lower lobes. No effusion or pneumothorax. No acute osseous abnormality. IMPRESSION: Patchy  airspace disease in involving the right lung consistent multilobar pneumonia. Electronically Signed   By: Ashley Royalty M.D.   On: 09/12/2016 12:33   US Renal  Result Date: 09/13/2016 CLINICAL DATA:  Acute renal failure. EXAM: RENAL / URINARY TRACT ULTRASOUND COMPLETE COMPARISON:  01/15/2015 renal sonogram. FINDINGS: Right Kidney: Length: 9.7 cm. Echogenic right kidney. No right hydronephrosis. No right renal mass. Left Kidney: Length: 9.4 cm. Echogenic left kidney. No left hydronephrosis. No left renal mass. Bladder: Appears normal for degree of bladder distention. Bilateral ureteral jets are demonstrated in the bladder. IMPRESSION: 1. No hydronephrosis. 2. Echogenic normal size kidneys bilaterally, indicating renal parenchymal disease of uncertain chronicity. 3. Normal bladder. Electronically Signed   By: Ilona Sorrel M.D.   On: 09/13/2016 09:20   Dg Chest Port 1 View  Result Date: 09/15/2016 CLINICAL DATA:  Post AV fistula and dialysis catheter placement EXAM: PORTABLE CHEST 1 VIEW COMPARISON:  Portable exam 1427 hours compared to 09/13/2016 FINDINGS: New LEFT jugular central venous catheter with tip projecting over the inferior RIGHT atrium. Enlargement of cardiac silhouette with pulmonary vascular congestion. New diffuse BILATERAL pulmonary infiltrates compatible with pulmonary edema. Cannot exclude atelectasis versus consolidation in densely opacified LEFT lower lobe. No pneumothorax. Bones unremarkable. IMPRESSION: Tip of RIGHT jugular line projects over  the inferior RIGHT atrium. Enlargement of cardiac silhouette with pulmonary vascular congestion and new severe diffuse BILATERAL airspace infiltrates consistent with pulmonary edema question fluid overload versus CHF. Cannot exclude coexistent atelectasis versus consolidation in LEFT lower lobe. Electronically Signed   By: Lavonia Dana M.D.   On: 09/15/2016 14:41   Dg Fluoro Guide Cv Line-no Report  Result Date: 09/15/2016 There is no Radiologist  interpretation  for this exam.   Subjective:  Discharge Exam: Vitals:   09/19/16 0930 09/19/16 1000 09/19/16 1030 09/19/16 1042  BP: (!) 126/92 (!) 133/92 (!) 136/100 (!) 135/91  Pulse: 85 81 80 79  Resp:    18  Temp:    98.5 F (36.9 C)  TempSrc:    Oral  SpO2:    97%  Weight:      Height:       General: Pt is alert, awake, not in acute distress Cardiovascular: RRR, S1/S2 +, no rubs, no gallops Respiratory: CTA bilaterally, no wheezing, no rhonchi Abdominal: Soft, NT, ND, bowel sounds + Extremities: no edema, no cyanosis   The results of significant diagnostics from this hospitalization (including imaging, microbiology, ancillary and laboratory) are listed below for reference.    Microbiology: Recent Results (from the past 240 hour(s))  MRSA PCR Screening     Status: None   Collection Time: 09/13/16 12:59 AM  Result Value Ref Range Status   MRSA by PCR NEGATIVE NEGATIVE Final    Comment:        The GeneXpert MRSA Assay (FDA approved for NASAL specimens only), is one component of a comprehensive MRSA colonization surveillance program. It is not intended to diagnose MRSA infection nor to guide or monitor treatment for MRSA infections.      Labs: BNP (last 3 results)  Recent Labs  09/12/16 1904  BNP 7,673.4*   Basic Metabolic Panel:  Recent Labs Lab 09/13/16 0204 09/14/16 0317 09/15/16 0301 09/16/16 0406 09/16/16 1327 09/17/16 0253 09/18/16 0341 09/19/16 0257  NA 128* 129* 130* 132*  --  136 137 136  K 3.2* 3.3* 3.2* 2.9*  --  3.3* 3.9 4.1  CL 99* 97* 96* 97*  --  97* 100* 100*  CO2 13* 13* 14* 20*  --  26 28 26   GLUCOSE 219* 129* 106* 100*  --  100* 93 92  BUN 103* 122* 126* 64*  --  35* 14 32*  CREATININE 16.05* 16.29* 18.36* 11.60*  --  8.49* 5.92* 9.00*  CALCIUM 8.7* 8.4* 8.4* 8.3* 8.1* 8.8* 8.9 9.0  MG  --  3.0* 3.0* 2.4  --  2.5*  --   --   PHOS 8.2* 10.3* 10.6* 6.4*  --  6.5*  --   --    Liver Function Tests:  Recent Labs Lab  09/13/16 0204 09/16/16 0406 09/17/16 0253  AST 17  --   --   ALT 17  --   --   ALKPHOS 42  --   --   BILITOT 0.6  --   --   PROT 5.9*  --   --   ALBUMIN 3.3* 3.1* 2.9*   No results for input(s): LIPASE, AMYLASE in the last 168 hours. No results for input(s): AMMONIA in the last 168 hours. CBC:  Recent Labs Lab 09/14/16 0709 09/15/16 0301 09/16/16 0406 09/17/16 0253 09/18/16 0341 09/19/16 0257  WBC 9.0 7.9 8.1 7.8 10.7* 11.4*  NEUTROABS 6.9 5.5 6.4 5.3  --   --   HGB 6.9* 7.1* 7.1* 7.0* 7.4*  7.8*  HCT 19.1* 19.8* 19.9* 20.1* 22.3* 24.3*  MCV 89.3 91.2 91.3 93.5 98.7 100.8*  PLT 130* 171 160 169 207 220   Cardiac Enzymes:  Recent Labs Lab 09/13/16 0204 09/13/16 0731 09/13/16 1510  TROPONINI 0.33* 0.24* 0.19*   BNP: Invalid input(s): POCBNP CBG: No results for input(s): GLUCAP in the last 168 hours. D-Dimer No results for input(s): DDIMER in the last 72 hours. Hgb A1c No results for input(s): HGBA1C in the last 72 hours. Lipid Profile No results for input(s): CHOL, HDL, LDLCALC, TRIG, CHOLHDL, LDLDIRECT in the last 72 hours. Thyroid function studies No results for input(s): TSH, T4TOTAL, T3FREE, THYROIDAB in the last 72 hours.  Invalid input(s): FREET3 Anemia work up No results for input(s): VITAMINB12, FOLATE, FERRITIN, TIBC, IRON, RETICCTPCT in the last 72 hours. Urinalysis    Component Value Date/Time   COLORURINE AMBER (A) 09/18/2016 1822   APPEARANCEUR CLOUDY (A) 09/18/2016 1822   LABSPEC 1.013 09/18/2016 1822   PHURINE 8.0 09/18/2016 1822   GLUCOSEU NEGATIVE 09/18/2016 1822   HGBUR SMALL (A) 09/18/2016 1822   BILIRUBINUR NEGATIVE 09/18/2016 1822   KETONESUR NEGATIVE 09/18/2016 1822   PROTEINUR 100 (A) 09/18/2016 1822   UROBILINOGEN 1.0 01/17/2015 0935   NITRITE NEGATIVE 09/18/2016 1822   LEUKOCYTESUR MODERATE (A) 09/18/2016 1822   Sepsis Labs Invalid input(s): PROCALCITONIN,  WBC,  LACTICIDVEN Microbiology Recent Results (from the past 240  hour(s))  MRSA PCR Screening     Status: None   Collection Time: 09/13/16 12:59 AM  Result Value Ref Range Status   MRSA by PCR NEGATIVE NEGATIVE Final    Comment:        The GeneXpert MRSA Assay (FDA approved for NASAL specimens only), is one component of a comprehensive MRSA colonization surveillance program. It is not intended to diagnose MRSA infection nor to guide or monitor treatment for MRSA infections.      Time coordinating discharge: Over 30 minutes  SIGNED:   Birdie Hopes, MD  [facility] 09/19/2016, 4:05 PM Pager   If 7PM-7AM, please contact night-coverage [email or web address] Password [email or web address]

## 2016-09-22 ENCOUNTER — Telehealth: Payer: Self-pay | Admitting: Family Medicine

## 2016-09-22 LAB — PTH-RELATED PEPTIDE: PTH-related peptide: <1.1 pmol/L

## 2016-09-22 NOTE — Telephone Encounter (Signed)
Pt called requesting medication for pain, please f/up

## 2016-09-24 NOTE — Telephone Encounter (Signed)
Patient called back wants to know if we can prescribe anything for pain. Please f/up

## 2016-09-25 MED ORDER — TRAMADOL HCL 50 MG PO TABS: 50.0000 mg | ORAL_TABLET | Freq: Two times a day (BID) | ORAL | 0 refills | Status: DC | PRN

## 2016-09-25 MED ORDER — ACETAMINOPHEN-CODEINE #3 300-30 MG PO TABS: 1.0000 | ORAL_TABLET | Freq: Three times a day (TID) | ORAL | 0 refills | Status: DC | PRN

## 2016-09-25 NOTE — Telephone Encounter (Signed)
Tramadol ordered Is this for pain related to AV fistula? Patient can pick up Rx and make appt for have pain evaluated

## 2016-09-25 NOTE — Telephone Encounter (Signed)
Called back to patient Verified name and DOB She reports that she developed hives while taking tramadol during a hospitalization  She reports that she is not allergy to percocet.  I informed her that we do not prescribe percocet, and pain management referral was offered  Tylenol #3 offered  She is amenable. Advised she stop use and take benadryl if hives occur. Also advised that she call.  Tylenol #3 placed up front for pick up

## 2016-09-25 NOTE — Telephone Encounter (Signed)
Pt was called and informed of tramadol being ready for pick up . Pt states that she does not take tramadol.

## 2016-09-26 ENCOUNTER — Telehealth: Payer: Self-pay

## 2016-09-26 MED FILL — ACETAMINOPHEN/COD #3 TABLET: 300-30 | 20 days supply | Qty: 60 | Fill #0

## 2016-09-26 NOTE — Telephone Encounter (Signed)
Pt was called and informed of script being ready.

## 2016-10-14 MED FILL — LABETALOL HCL 300 MG TABLET: 300 | 30 days supply | Qty: 60 | Fill #0

## 2016-10-15 ENCOUNTER — Encounter: Payer: Self-pay | Admitting: Vascular Surgery

## 2016-10-21 ENCOUNTER — Encounter: Payer: Self-pay | Admitting: Vascular Surgery

## 2016-10-21 ENCOUNTER — Ambulatory Visit (INDEPENDENT_AMBULATORY_CARE_PROVIDER_SITE_OTHER): Payer: Self-pay | Admitting: Vascular Surgery

## 2016-10-21 ENCOUNTER — Encounter: Payer: Medicaid Other | Admitting: Vascular Surgery

## 2016-10-21 ENCOUNTER — Ambulatory Visit (HOSPITAL_COMMUNITY)
Admission: RE | Admit: 2016-10-21 | Discharge: 2016-10-21 | Disposition: A | Discharge status: Home / Self Care | Payer: Medicaid Other | Source: Ambulatory Visit | Attending: Vascular Surgery | Admitting: Vascular Surgery

## 2016-10-21 ENCOUNTER — Encounter (HOSPITAL_COMMUNITY): Payer: Medicaid Other

## 2016-10-21 VITALS — BP 187/124 | HR 82 | Temp 98.8°F | Resp 16 | Ht 66.0 in | Wt 140.0 lb

## 2016-10-21 DIAGNOSIS — N186 End stage renal disease: Secondary | ICD-10-CM | POA: Diagnosis present

## 2016-10-21 DIAGNOSIS — Z4931 Encounter for adequacy testing for hemodialysis: Secondary | ICD-10-CM | POA: Diagnosis not present

## 2016-10-21 DIAGNOSIS — Z992 Dependence on renal dialysis: Secondary | ICD-10-CM

## 2016-10-21 NOTE — Progress Notes (Signed)
   Patient name: Melody [name] MRN: [id number] DOB: 11/24/1975 Sex: female  REASON FOR VISIT: Follow-up right radiocephalic fistula creation  HPI: Melody [name] is a 41 y.o. female status post right IJ tunneled catheter and right radiocephalic fistula creation by myself on 09/15/2016. She reports that she has had good functioning of her catheter with the mid hemodialysis access. She has no steal symptoms. She has a completely healed her radial to cephalic incision  Current Outpatient Prescriptions  Medication Sig Dispense Refill  . amLODipine (NORVASC) 10 MG tablet Take 1 tablet (10 mg total) by mouth at bedtime. 30 tablet 0  . calcium acetate (PHOSLO) 667 MG capsule Take 2 capsules (1,334 mg total) by mouth 3 (three) times daily with meals. 180 capsule 0  . Darbepoetin Alfa (ARANESP) 60 MCG/0.3ML SOSY injection Inject 0.3 mLs (60 mcg total) into the vein every Tuesday with hemodialysis. 4.2 mL   . hydrALAZINE (APRESOLINE) 50 MG tablet Take 1 tablet (50 mg total) by mouth every 8 (eight) hours. 90 tablet 0  . labetalol (NORMODYNE) 200 MG tablet Take 1 tablet (200 mg total) by mouth 2 (two) times daily. 60 tablet 0  . lisinopril (PRINIVIL,ZESTRIL) 10 MG tablet Take 1 tablet (10 mg total) by mouth at bedtime. 30 tablet 0   No current facility-administered medications for this visit.      PHYSICAL EXAM: Vitals:   10/21/16 1531  BP: (!) 187/124  Pulse: 82  Resp: 16  Temp: 98.8 F (37.1 C)  TempSrc: Oral  SpO2: 100%  Weight: 140 lb (63.5 kg)  Height: 5\' 6"  (1.676 m)    GENERAL: The patient is a well-nourished female, in no acute distress. The vital signs are documented above. Excellent thrill with a good size maturation of her forearm cephalic vein fistula. She does have an area where the pelvic vein branches in the mid upper arm. I imaged this with SonoSite ultrasound. There is a dominant of vein that drains into the antecubital  vein.  MEDICAL ISSUES: Excellent Keeley Sussman maturation of her fistula one month out. I feel that this will continue to be highly successful for dialysis. Would wait a total of 3 months before access. I did explain that it's possible that the smaller competing branches in the mid upper arm could slow to this and in the future could require ligation but I doubt this would be required. I certainly would not recommend it currently. She will continue her dialysis for an additional 2 months via her catheter and then began using the fistula. She will see [place] again on an as-needed basis   Rosetta Posner, MD [facility] Tel [phone] Pager [phone]

## 2016-10-23 ENCOUNTER — Ambulatory Visit: Payer: Medicaid Other | Admitting: Family Medicine

## 2016-11-24 DIAGNOSIS — N2581 Secondary hyperparathyroidism of renal origin: Secondary | ICD-10-CM | POA: Diagnosis not present

## 2016-11-24 DIAGNOSIS — D631 Anemia in chronic kidney disease: Secondary | ICD-10-CM | POA: Diagnosis not present

## 2016-11-24 DIAGNOSIS — N186 End stage renal disease: Secondary | ICD-10-CM | POA: Diagnosis not present

## 2016-11-26 ENCOUNTER — Telehealth: Payer: Self-pay

## 2016-11-26 DIAGNOSIS — Z01810 Encounter for preprocedural cardiovascular examination: Secondary | ICD-10-CM

## 2016-11-26 DIAGNOSIS — N186 End stage renal disease: Secondary | ICD-10-CM | POA: Diagnosis not present

## 2016-11-26 DIAGNOSIS — D631 Anemia in chronic kidney disease: Secondary | ICD-10-CM | POA: Diagnosis not present

## 2016-11-26 DIAGNOSIS — N2581 Secondary hyperparathyroidism of renal origin: Secondary | ICD-10-CM | POA: Diagnosis not present

## 2016-11-26 NOTE — Telephone Encounter (Signed)
Phone call from nurse at the kidney center.  Reported that pt. Has a clotted access.  Stated the pt. noticed it on Saturday, but the nurse was just made aware of it today.  Discussed with Dr. Donnetta Hutching.  Advised to have pt. f/u in the office for re-evaluation of new access.  Will contact the kidney center with appt. Information.

## 2016-11-26 NOTE — Telephone Encounter (Signed)
Does the patient have to see TFE? The patient has dialysis MWF.

## 2016-11-27 NOTE — Telephone Encounter (Signed)
To put her on the schedule, I put her lab on 4/25 and office visit on 5/1. I will continue to find a sooner appointment. Dialysis is willing to adjust her days to accommodate the 4/25 appt.

## 2016-11-28 DIAGNOSIS — N186 End stage renal disease: Secondary | ICD-10-CM | POA: Diagnosis not present

## 2016-11-28 DIAGNOSIS — N2581 Secondary hyperparathyroidism of renal origin: Secondary | ICD-10-CM | POA: Diagnosis not present

## 2016-11-28 DIAGNOSIS — D631 Anemia in chronic kidney disease: Secondary | ICD-10-CM | POA: Diagnosis not present

## 2016-12-01 DIAGNOSIS — N2581 Secondary hyperparathyroidism of renal origin: Secondary | ICD-10-CM | POA: Diagnosis not present

## 2016-12-01 DIAGNOSIS — N186 End stage renal disease: Secondary | ICD-10-CM | POA: Diagnosis not present

## 2016-12-01 DIAGNOSIS — D631 Anemia in chronic kidney disease: Secondary | ICD-10-CM | POA: Diagnosis not present

## 2016-12-02 MED FILL — LABETALOL HCL 300 MG TABLET: 300 | 30 days supply | Qty: 60 | Fill #1

## 2016-12-03 DIAGNOSIS — N186 End stage renal disease: Secondary | ICD-10-CM | POA: Diagnosis not present

## 2016-12-03 DIAGNOSIS — D631 Anemia in chronic kidney disease: Secondary | ICD-10-CM | POA: Diagnosis not present

## 2016-12-03 DIAGNOSIS — N2581 Secondary hyperparathyroidism of renal origin: Secondary | ICD-10-CM | POA: Diagnosis not present

## 2016-12-05 DIAGNOSIS — D631 Anemia in chronic kidney disease: Secondary | ICD-10-CM | POA: Diagnosis not present

## 2016-12-05 DIAGNOSIS — N186 End stage renal disease: Secondary | ICD-10-CM | POA: Diagnosis not present

## 2016-12-05 DIAGNOSIS — N2581 Secondary hyperparathyroidism of renal origin: Secondary | ICD-10-CM | POA: Diagnosis not present

## 2016-12-08 DIAGNOSIS — D631 Anemia in chronic kidney disease: Secondary | ICD-10-CM | POA: Diagnosis not present

## 2016-12-08 DIAGNOSIS — N186 End stage renal disease: Secondary | ICD-10-CM | POA: Diagnosis not present

## 2016-12-08 DIAGNOSIS — N2581 Secondary hyperparathyroidism of renal origin: Secondary | ICD-10-CM | POA: Diagnosis not present

## 2016-12-09 ENCOUNTER — Encounter: Payer: Medicaid Other | Admitting: Family Medicine

## 2016-12-09 ENCOUNTER — Encounter (HOSPITAL_COMMUNITY): Payer: Medicaid Other

## 2016-12-10 DIAGNOSIS — D631 Anemia in chronic kidney disease: Secondary | ICD-10-CM | POA: Diagnosis not present

## 2016-12-10 DIAGNOSIS — N186 End stage renal disease: Secondary | ICD-10-CM | POA: Diagnosis not present

## 2016-12-10 DIAGNOSIS — N2581 Secondary hyperparathyroidism of renal origin: Secondary | ICD-10-CM | POA: Diagnosis not present

## 2016-12-12 DIAGNOSIS — D631 Anemia in chronic kidney disease: Secondary | ICD-10-CM | POA: Diagnosis not present

## 2016-12-12 DIAGNOSIS — N2581 Secondary hyperparathyroidism of renal origin: Secondary | ICD-10-CM | POA: Diagnosis not present

## 2016-12-12 DIAGNOSIS — N186 End stage renal disease: Secondary | ICD-10-CM | POA: Diagnosis not present

## 2016-12-15 DIAGNOSIS — N2581 Secondary hyperparathyroidism of renal origin: Secondary | ICD-10-CM | POA: Diagnosis not present

## 2016-12-15 DIAGNOSIS — N186 End stage renal disease: Secondary | ICD-10-CM | POA: Diagnosis not present

## 2016-12-15 DIAGNOSIS — D631 Anemia in chronic kidney disease: Secondary | ICD-10-CM | POA: Diagnosis not present

## 2016-12-16 ENCOUNTER — Encounter: Payer: Self-pay | Admitting: Gastroenterology

## 2016-12-16 ENCOUNTER — Encounter: Payer: Self-pay | Admitting: Vascular Surgery

## 2016-12-16 ENCOUNTER — Other Ambulatory Visit: Payer: Self-pay | Admitting: Family Medicine

## 2016-12-16 DIAGNOSIS — Z1231 Encounter for screening mammogram for malignant neoplasm of breast: Secondary | ICD-10-CM

## 2016-12-17 ENCOUNTER — Ambulatory Visit (HOSPITAL_COMMUNITY)
Admission: RE | Admit: 2016-12-17 | Discharge: 2016-12-17 | Disposition: A | Discharge status: Home / Self Care | Payer: Medicare Other | Source: Ambulatory Visit | Attending: Vascular Surgery | Admitting: Vascular Surgery

## 2016-12-17 DIAGNOSIS — Z01810 Encounter for preprocedural cardiovascular examination: Secondary | ICD-10-CM | POA: Insufficient documentation

## 2016-12-17 DIAGNOSIS — N186 End stage renal disease: Secondary | ICD-10-CM | POA: Diagnosis not present

## 2016-12-17 DIAGNOSIS — D631 Anemia in chronic kidney disease: Secondary | ICD-10-CM | POA: Diagnosis not present

## 2016-12-17 DIAGNOSIS — N2581 Secondary hyperparathyroidism of renal origin: Secondary | ICD-10-CM | POA: Diagnosis not present

## 2016-12-19 DIAGNOSIS — D631 Anemia in chronic kidney disease: Secondary | ICD-10-CM | POA: Diagnosis not present

## 2016-12-19 DIAGNOSIS — N186 End stage renal disease: Secondary | ICD-10-CM | POA: Diagnosis not present

## 2016-12-19 DIAGNOSIS — N2581 Secondary hyperparathyroidism of renal origin: Secondary | ICD-10-CM | POA: Diagnosis not present

## 2016-12-22 DIAGNOSIS — I129 Hypertensive chronic kidney disease with stage 1 through stage 4 chronic kidney disease, or unspecified chronic kidney disease: Secondary | ICD-10-CM | POA: Diagnosis not present

## 2016-12-22 DIAGNOSIS — D631 Anemia in chronic kidney disease: Secondary | ICD-10-CM | POA: Diagnosis not present

## 2016-12-22 DIAGNOSIS — N2581 Secondary hyperparathyroidism of renal origin: Secondary | ICD-10-CM | POA: Diagnosis not present

## 2016-12-22 DIAGNOSIS — N186 End stage renal disease: Secondary | ICD-10-CM | POA: Diagnosis not present

## 2016-12-22 DIAGNOSIS — Z992 Dependence on renal dialysis: Secondary | ICD-10-CM | POA: Diagnosis not present

## 2016-12-23 ENCOUNTER — Ambulatory Visit: Payer: Medicaid Other

## 2016-12-24 DIAGNOSIS — N186 End stage renal disease: Secondary | ICD-10-CM | POA: Diagnosis not present

## 2016-12-24 DIAGNOSIS — D509 Iron deficiency anemia, unspecified: Secondary | ICD-10-CM | POA: Diagnosis not present

## 2016-12-24 DIAGNOSIS — D631 Anemia in chronic kidney disease: Secondary | ICD-10-CM | POA: Diagnosis not present

## 2016-12-24 DIAGNOSIS — N2581 Secondary hyperparathyroidism of renal origin: Secondary | ICD-10-CM | POA: Diagnosis not present

## 2016-12-25 ENCOUNTER — Encounter: Payer: Self-pay | Admitting: Family

## 2016-12-25 ENCOUNTER — Ambulatory Visit (INDEPENDENT_AMBULATORY_CARE_PROVIDER_SITE_OTHER): Payer: Medicare Other | Admitting: Vascular Surgery

## 2016-12-25 ENCOUNTER — Other Ambulatory Visit: Payer: Self-pay

## 2016-12-25 VITALS — BP 117/89 | HR 66 | Temp 97.8°F | Resp 20 | Ht 66.0 in | Wt 140.4 lb

## 2016-12-25 DIAGNOSIS — N186 End stage renal disease: Secondary | ICD-10-CM | POA: Diagnosis not present

## 2016-12-25 DIAGNOSIS — Z992 Dependence on renal dialysis: Secondary | ICD-10-CM | POA: Diagnosis not present

## 2016-12-25 NOTE — Progress Notes (Signed)
[facility] [place]  Patient name: Melody [name] MRN: [id number] DOB: 04-20-1976 Sex: female  REASON FOR VISIT: new access  HPI: Melody [name] is a 41 y.o. female who presents for evaluation for new dialysis access. The patient recently underwent right radiocephalic AV fistula creation on 09/15/2016. This was reported to be clotted 4 weeks ago. Her fistula was never used. She is currently dialyzing via a right IJ tunneled dialysis catheter on Mondays, Wednesdays and Fridays. The patient is right-handed. She has not had any access procedures in the left arm.  Her past medical history includes hypertension, drug abuse and tobacco abuse. She is not on blood thinners. Denies any chest pain. Does report occasional shortness of breath during dialysis.   Past Medical History:  Diagnosis Date  . Alcohol abuse   . Drug abuse   . Ectopic pregnancy   . Hypertension Dx May 2016  . Tobacco abuse     Family History  Problem Relation Age of Onset  . CAD Mother   . Hypertension Mother   . Heart disease Mother   . Kidney failure Mother   . CAD Brother   . Hypertension Other   . Kidney failure Other   . Hypertension Maternal Grandmother   . Kidney failure Maternal Grandmother   . Congestive Heart Failure Maternal Grandmother     SOCIAL HISTORY: Social History  Substance Use Topics  . Smoking status: Former Smoker    Packs/day: 0.00    Years: 0.00    Types: Cigars, Cigarettes    Start date: 09/11/2016  . Smokeless tobacco: Former Systems developer  . Alcohol use 0.0 oz/week     Comment: socially    Allergies  Allergen Reactions  . Acetaminophen     Tylenol #3 Causes nightmares  . Tramadol Hives    Current Outpatient Prescriptions  Medication Sig Dispense Refill  . amLODipine (NORVASC) 10 MG tablet Take 1 tablet (10 mg total) by mouth at bedtime. 30 tablet 0  . calcium acetate (PHOSLO) 667 MG capsule Take 2 capsules (1,334 mg total) by mouth 3 (three) times  daily with meals. 180 capsule 0  . lisinopril (PRINIVIL,ZESTRIL) 10 MG tablet Take 1 tablet (10 mg total) by mouth at bedtime. 30 tablet 0  . Darbepoetin Alfa (ARANESP) 60 MCG/0.3ML SOSY injection Inject 0.3 mLs (60 mcg total) into the vein every Tuesday with hemodialysis. (Patient not taking: Reported on 12/25/2016) 4.2 mL   . hydrALAZINE (APRESOLINE) 50 MG tablet Take 1 tablet (50 mg total) by mouth every 8 (eight) hours. (Patient not taking: Reported on 12/25/2016) 90 tablet 0  . labetalol (NORMODYNE) 200 MG tablet Take 1 tablet (200 mg total) by mouth 2 (two) times daily. (Patient not taking: Reported on 12/25/2016) 60 tablet 0   No current facility-administered medications for this visit.     REVIEW OF SYSTEMS:  [X]  denotes positive finding, [ ]  denotes negative finding Cardiac  Comments:  Chest pain or chest pressure:    Shortness of breath upon exertion: x Occasional shortness of breath with dialysis  Short of breath when lying flat:    Irregular heart rhythm:        Vascular    Pain in calf, thigh, or hip brought on by ambulation:    Pain in feet at night that wakes you up from your sleep:     Blood clot in your veins:    Leg swelling:         Pulmonary  Oxygen at home:    Productive cough:     Wheezing:         Neurologic    Sudden weakness in arms or legs:     Sudden numbness in arms or legs:     Sudden onset of difficulty speaking or slurred speech:    Temporary loss of vision in one eye:     Problems with dizziness:         Gastrointestinal    Blood in stool:     Vomited blood:         Genitourinary    Burning when urinating:     Blood in urine:        Psychiatric    Major depression:         Hematologic    Bleeding problems:    Problems with blood clotting too easily:        Skin    Rashes or ulcers:        Constitutional    Fever or chills:      PHYSICAL EXAM: Vitals:   12/25/16 1253  BP: 117/89  Pulse: 66  Resp: 20  Temp: 97.8 F (36.6 C)    TempSrc: Oral  SpO2: 96%  Weight: 140 lb 6.4 oz (63.7 kg)  Height: 5\' 6"  (1.676 m)    GENERAL: The patient is a well-nourished female, in no acute distress. The vital signs are documented above. HEENT: normocephalic, atraumatic. No abnormalities noted.  CARDIAC: There is a regular rate and rhythm. No carotid bruits.  VASCULAR: 2+ radial and brachial pulses bilaterally.  PULMONARY: There is good air exchange bilaterally without wheezing or rales. MUSCULOSKELETAL: There are no major deformities or cyanosis. NEUROLOGIC: No focal weakness or paresthesias are detected. SKIN: There are no ulcers or rashes noted. PSYCHIATRIC: The patient has a normal affect.  DATA:  Upper extremity vein mapping 12/17/16  Right: adequate right basilic vein Left: adequate left cephalic vein, adequate left basilic vein  MEDICAL ISSUES: ESRD on HD  The patient's right radial-cephalic fistula has clotted. Currently dialyzing via right IJ TDC on Mondays, Wednesdays and Fridays. Patient is right handed. Has adequate vein for left brachial-cephalic AV fistula. This will be scheduled for 12/30/16 with Dr. Oneida Alar who also saw and evaluated patient.   Virgina Jock, PA-C [facility] MD: Oneida Alar

## 2016-12-26 DIAGNOSIS — D509 Iron deficiency anemia, unspecified: Secondary | ICD-10-CM | POA: Diagnosis not present

## 2016-12-26 DIAGNOSIS — D631 Anemia in chronic kidney disease: Secondary | ICD-10-CM | POA: Diagnosis not present

## 2016-12-26 DIAGNOSIS — N186 End stage renal disease: Secondary | ICD-10-CM | POA: Diagnosis not present

## 2016-12-26 DIAGNOSIS — N2581 Secondary hyperparathyroidism of renal origin: Secondary | ICD-10-CM | POA: Diagnosis not present

## 2016-12-29 ENCOUNTER — Encounter (HOSPITAL_COMMUNITY): Payer: Self-pay | Admitting: *Deleted

## 2016-12-29 DIAGNOSIS — D631 Anemia in chronic kidney disease: Secondary | ICD-10-CM | POA: Diagnosis not present

## 2016-12-29 DIAGNOSIS — N2581 Secondary hyperparathyroidism of renal origin: Secondary | ICD-10-CM | POA: Diagnosis not present

## 2016-12-29 DIAGNOSIS — N186 End stage renal disease: Secondary | ICD-10-CM | POA: Diagnosis not present

## 2016-12-29 DIAGNOSIS — D509 Iron deficiency anemia, unspecified: Secondary | ICD-10-CM | POA: Diagnosis not present

## 2016-12-29 NOTE — Progress Notes (Signed)
Pt denies SOB, chest pain, and being under the care of a cardiologist. Pt denies having a stress test and cardiac cath. Pt made aware to stop taking vitamins, fish oil and herbal medications. Do not take any NSAIDs ie: Ibuprofen, Advil, Naproxen, BC and Goody Powder. Pt verbalized understanding of all pre-op instructions.

## 2016-12-30 ENCOUNTER — Ambulatory Visit (HOSPITAL_COMMUNITY): Payer: Medicare Other | Admitting: Anesthesiology

## 2016-12-30 ENCOUNTER — Ambulatory Visit (HOSPITAL_COMMUNITY)
Admission: RE | Admit: 2016-12-30 | Discharge: 2016-12-30 | Disposition: A | Discharge status: Home / Self Care | Payer: Medicare Other | Source: Ambulatory Visit | Attending: Vascular Surgery | Admitting: Vascular Surgery

## 2016-12-30 ENCOUNTER — Encounter (HOSPITAL_COMMUNITY): Payer: Self-pay | Admitting: *Deleted

## 2016-12-30 ENCOUNTER — Encounter (HOSPITAL_COMMUNITY)
Admission: RE | Disposition: A | Discharge status: Home / Self Care | Payer: Self-pay | Source: Ambulatory Visit | Attending: Vascular Surgery

## 2016-12-30 DIAGNOSIS — N185 Chronic kidney disease, stage 5: Secondary | ICD-10-CM | POA: Diagnosis not present

## 2016-12-30 DIAGNOSIS — R0902 Hypoxemia: Secondary | ICD-10-CM | POA: Diagnosis not present

## 2016-12-30 DIAGNOSIS — I120 Hypertensive chronic kidney disease with stage 5 chronic kidney disease or end stage renal disease: Secondary | ICD-10-CM | POA: Insufficient documentation

## 2016-12-30 DIAGNOSIS — Z992 Dependence on renal dialysis: Secondary | ICD-10-CM | POA: Diagnosis not present

## 2016-12-30 DIAGNOSIS — Z79899 Other long term (current) drug therapy: Secondary | ICD-10-CM | POA: Insufficient documentation

## 2016-12-30 DIAGNOSIS — Z87891 Personal history of nicotine dependence: Secondary | ICD-10-CM | POA: Insufficient documentation

## 2016-12-30 DIAGNOSIS — N186 End stage renal disease: Secondary | ICD-10-CM | POA: Diagnosis not present

## 2016-12-30 DIAGNOSIS — Z8249 Family history of ischemic heart disease and other diseases of the circulatory system: Secondary | ICD-10-CM | POA: Insufficient documentation

## 2016-12-30 DIAGNOSIS — D696 Thrombocytopenia, unspecified: Secondary | ICD-10-CM | POA: Diagnosis not present

## 2016-12-30 HISTORY — PX: AV FISTULA PLACEMENT: SHX1204

## 2016-12-30 HISTORY — DX: Chronic kidney disease, unspecified: N18.9

## 2016-12-30 HISTORY — DX: Headache, unspecified: R51.9

## 2016-12-30 HISTORY — DX: Headache: R51

## 2016-12-30 LAB — POCT I-STAT 4, (NA,K, GLUC, HGB,HCT)
Glucose, Bld: 95 mg/dL (ref 65–99)
HCT: 34 % — ABNORMAL LOW (ref 36.0–46.0)
Hemoglobin: 11.6 g/dL — ABNORMAL LOW (ref 12.0–15.0)
Potassium: 3.6 mmol/L (ref 3.5–5.1)
Sodium: 137 mmol/L (ref 135–145)

## 2016-12-30 LAB — HCG, SERUM, QUALITATIVE: Preg, Serum: NEGATIVE

## 2016-12-30 LAB — I-STAT BETA HCG BLOOD, ED (NOT ORDERABLE): I-stat hCG, quantitative: 8.1 m[IU]/mL — ABNORMAL HIGH (ref <5)

## 2016-12-30 SURGERY — ARTERIOVENOUS (AV) FISTULA CREATION
Anesthesia: General | Site: Arm Lower | Laterality: Left

## 2016-12-30 MED ORDER — OXYCODONE HCL 5 MG PO TABS
5.0000 mg | ORAL_TABLET | Freq: Once | ORAL | Status: AC
  Administered 2016-12-30: 5 mg via ORAL

## 2016-12-30 MED ORDER — FENTANYL CITRATE (PF) 100 MCG/2ML IJ SOLN
INTRAMUSCULAR | Status: DC | PRN
  Administered 2016-12-30 (×5): 50 ug via INTRAVENOUS

## 2016-12-30 MED ORDER — OXYCODONE HCL 5 MG PO TABS
ORAL_TABLET | ORAL | Status: AC
  Filled 2016-12-30: qty 1

## 2016-12-30 MED ORDER — PHENYLEPHRINE HCL 10 MG/ML IJ SOLN
INTRAMUSCULAR | Status: DC | PRN
  Administered 2016-12-30 (×4): 80 ug via INTRAVENOUS

## 2016-12-30 MED ORDER — PROTAMINE SULFATE 10 MG/ML IV SOLN
INTRAVENOUS | Status: AC
  Filled 2016-12-30: qty 5

## 2016-12-30 MED ORDER — PROPOFOL 500 MG/50ML IV EMUL
INTRAVENOUS | Status: DC | PRN
  Administered 2016-12-30: 25 ug/kg/min via INTRAVENOUS

## 2016-12-30 MED ORDER — SODIUM CHLORIDE 0.9 % IV SOLN: INTRAVENOUS | Status: DC

## 2016-12-30 MED ORDER — 0.9 % SODIUM CHLORIDE (POUR BTL) OPTIME
TOPICAL | Status: DC | PRN
Start: 2016-12-30 — End: 2016-12-30
  Administered 2016-12-30: 1000 mL

## 2016-12-30 MED ORDER — LIDOCAINE 2% (20 MG/ML) 5 ML SYRINGE
INTRAMUSCULAR | Status: AC
  Filled 2016-12-30: qty 5

## 2016-12-30 MED ORDER — LIDOCAINE HCL (CARDIAC) 20 MG/ML IV SOLN
INTRAVENOUS | Status: DC | PRN
  Administered 2016-12-30: 30 mg via INTRATRACHEAL

## 2016-12-30 MED ORDER — PROTAMINE SULFATE 10 MG/ML IV SOLN
INTRAVENOUS | Status: DC | PRN
  Administered 2016-12-30: 50 mg via INTRAVENOUS

## 2016-12-30 MED ORDER — PROPOFOL 10 MG/ML IV BOLUS
INTRAVENOUS | Status: DC | PRN
  Administered 2016-12-30: 150 mg via INTRAVENOUS
  Administered 2016-12-30: 50 mg via INTRAVENOUS

## 2016-12-30 MED ORDER — SODIUM CHLORIDE 0.9 % IV SOLN
INTRAVENOUS | Status: DC | PRN
  Administered 2016-12-30: 10:00:00

## 2016-12-30 MED ORDER — HEMOSTATIC AGENTS (NO CHARGE) OPTIME
TOPICAL | Status: DC | PRN
  Administered 2016-12-30: 1 via TOPICAL

## 2016-12-30 MED ORDER — FENTANYL CITRATE (PF) 100 MCG/2ML IJ SOLN
INTRAMUSCULAR | Status: AC
  Filled 2016-12-30: qty 2

## 2016-12-30 MED ORDER — CEFUROXIME SODIUM 1.5 G IJ SOLR
1.5000 g | INTRAMUSCULAR | Status: AC
  Administered 2016-12-30: 1.5 g via INTRAVENOUS
  Filled 2016-12-30: qty 1.5

## 2016-12-30 MED ORDER — CHLORHEXIDINE GLUCONATE CLOTH 2 % EX PADS: 6.0000 | MEDICATED_PAD | Freq: Once | CUTANEOUS | Status: DC

## 2016-12-30 MED ORDER — HEPARIN SODIUM (PORCINE) 1000 UNIT/ML IJ SOLN
INTRAMUSCULAR | Status: DC | PRN
  Administered 2016-12-30: 5000 [IU] via INTRAVENOUS

## 2016-12-30 MED ORDER — SODIUM CHLORIDE 0.9 % IV SOLN
INTRAVENOUS | Status: DC
  Administered 2016-12-30: 10:00:00 via INTRAVENOUS
  Administered 2016-12-30: 10 mL/h via INTRAVENOUS

## 2016-12-30 MED ORDER — OXYCODONE HCL 5 MG PO TABS: 5.0000 mg | ORAL_TABLET | ORAL | 0 refills | Status: DC | PRN

## 2016-12-30 MED ORDER — LIDOCAINE HCL (PF) 1 % IJ SOLN
INTRAMUSCULAR | Status: DC | PRN
  Administered 2016-12-30: 20 mL

## 2016-12-30 MED ORDER — HEPARIN SODIUM (PORCINE) 1000 UNIT/ML IJ SOLN
INTRAMUSCULAR | Status: AC
  Filled 2016-12-30: qty 1

## 2016-12-30 MED ORDER — LIDOCAINE HCL 1 % IJ SOLN
INTRAMUSCULAR | Status: AC
  Filled 2016-12-30: qty 20

## 2016-12-30 MED ORDER — FENTANYL CITRATE (PF) 100 MCG/2ML IJ SOLN
25.0000 ug | INTRAMUSCULAR | Status: DC | PRN
  Administered 2016-12-30 (×2): 50 ug via INTRAVENOUS

## 2016-12-30 MED ORDER — FENTANYL CITRATE (PF) 250 MCG/5ML IJ SOLN
INTRAMUSCULAR | Status: AC
  Filled 2016-12-30: qty 5

## 2016-12-30 MED ORDER — MIDAZOLAM HCL 2 MG/2ML IJ SOLN
INTRAMUSCULAR | Status: AC
  Filled 2016-12-30: qty 2

## 2016-12-30 SURGICAL SUPPLY — 44 items
ADH SKN CLS APL DERMABOND .7 (GAUZE/BANDAGES/DRESSINGS) ×1
AGENT HMST SPONGE THK3/8 (HEMOSTASIS) ×1
ARMBAND PINK RESTRICT EXTREMIT (MISCELLANEOUS) ×3 IMPLANT
CANISTER SUCT 3000ML PPV (MISCELLANEOUS) ×2 IMPLANT
CANNULA VESSEL 3MM 2 BLNT TIP (CANNULA) ×2 IMPLANT
CLIP TI MEDIUM 6 (CLIP) ×2 IMPLANT
CLIP TI WIDE RED SMALL 6 (CLIP) ×2 IMPLANT
COVER PROBE W GEL 5X96 (DRAPES) IMPLANT
DECANTER SPIKE VIAL GLASS SM (MISCELLANEOUS) ×2 IMPLANT
DERMABOND ADVANCED (GAUZE/BANDAGES/DRESSINGS) ×1
DERMABOND ADVANCED .7 DNX12 (GAUZE/BANDAGES/DRESSINGS) ×1 IMPLANT
DRAIN PENROSE 1/4X12 LTX STRL (WOUND CARE) ×2 IMPLANT
ELECT REM PT RETURN 9FT ADLT (ELECTROSURGICAL) ×2
ELECTRODE REM PT RTRN 9FT ADLT (ELECTROSURGICAL) ×1 IMPLANT
GLOVE BIO SURGEON STRL SZ7.5 (GLOVE) ×2 IMPLANT
GLOVE BIOGEL PI IND STRL 6.5 (GLOVE) IMPLANT
GLOVE BIOGEL PI IND STRL 7.0 (GLOVE) IMPLANT
GLOVE BIOGEL PI IND STRL 7.5 (GLOVE) IMPLANT
GLOVE BIOGEL PI INDICATOR 6.5 (GLOVE) ×2
GLOVE BIOGEL PI INDICATOR 7.0 (GLOVE) ×1
GLOVE BIOGEL PI INDICATOR 7.5 (GLOVE) ×1
GLOVE ECLIPSE 7.0 STRL STRAW (GLOVE) ×2 IMPLANT
GLOVE SURG SS PI 6.5 STRL IVOR (GLOVE) ×1 IMPLANT
GOWN STRL REUS W/ TWL LRG LVL3 (GOWN DISPOSABLE) ×3 IMPLANT
GOWN STRL REUS W/ TWL XL LVL3 (GOWN DISPOSABLE) IMPLANT
GOWN STRL REUS W/TWL LRG LVL3 (GOWN DISPOSABLE) ×6
GOWN STRL REUS W/TWL XL LVL3 (GOWN DISPOSABLE) ×2
GRAFT GORETEX STRT 4-7X45 (Vascular Products) ×1 IMPLANT
HEMOSTAT SPONGE AVITENE ULTRA (HEMOSTASIS) ×1 IMPLANT
KIT BASIN OR (CUSTOM PROCEDURE TRAY) ×2 IMPLANT
KIT ROOM TURNOVER OR (KITS) ×2 IMPLANT
LOOP VESSEL MINI RED (MISCELLANEOUS) IMPLANT
NS IRRIG 1000ML POUR BTL (IV SOLUTION) ×2 IMPLANT
PACK CV ACCESS (CUSTOM PROCEDURE TRAY) ×2 IMPLANT
PAD ARMBOARD 7.5X6 YLW CONV (MISCELLANEOUS) ×4 IMPLANT
SPONGE SURGIFOAM ABS GEL 100 (HEMOSTASIS) IMPLANT
SUT PROLENE 6 0 CC (SUTURE) ×6 IMPLANT
SUT PROLENE 7 0 BV 1 (SUTURE) ×2 IMPLANT
SUT VIC AB 3-0 SH 27 (SUTURE) ×4
SUT VIC AB 3-0 SH 27X BRD (SUTURE) ×1 IMPLANT
SUT VIC AB 4-0 PS2 27 (SUTURE) ×1 IMPLANT
SUT VICRYL 4-0 PS2 18IN ABS (SUTURE) ×2 IMPLANT
UNDERPAD 30X30 (UNDERPADS AND DIAPERS) ×2 IMPLANT
WATER STERILE IRR 1000ML POUR (IV SOLUTION) ×2 IMPLANT

## 2016-12-30 NOTE — Anesthesia Preprocedure Evaluation (Signed)
Anesthesia Evaluation  Patient identified by MRN, date of birth, ID band Patient awake    Reviewed: Allergy & Precautions, H&P , NPO status , Patient's Chart, lab work & pertinent test results  Airway Mallampati: II  TM Distance: >3 FB Neck ROM: Full    Dental no notable dental hx. (+) Chipped, Dental Advisory Given   Pulmonary neg pulmonary ROS, former smoker,    Pulmonary exam normal breath sounds clear to auscultation       Cardiovascular hypertension, Pt. on medications and Pt. on home beta blockers  Rhythm:Regular Rate:Normal     Neuro/Psych  Headaches, negative psych ROS   GI/Hepatic negative GI ROS, Neg liver ROS,   Endo/Other  negative endocrine ROS  Renal/GU ESRF and DialysisRenal disease  negative genitourinary   Musculoskeletal   Abdominal   Peds  Hematology negative hematology ROS (+) anemia ,   Anesthesia Other Findings   Reproductive/Obstetrics negative OB ROS                             Anesthesia Physical Anesthesia Plan  ASA: III  Anesthesia Plan: MAC   Post-op Pain Management:    Induction: Intravenous  Airway Management Planned: Simple Face Mask  Additional Equipment:   Intra-op Plan:   Post-operative Plan:   Informed Consent: I have reviewed the patients History and Physical, chart, labs and discussed the procedure including the risks, benefits and alternatives for the proposed anesthesia with the patient or authorized representative who has indicated his/her understanding and acceptance.   Dental advisory given  Plan Discussed with: CRNA  Anesthesia Plan Comments:         Anesthesia Quick Evaluation

## 2016-12-30 NOTE — Anesthesia Postprocedure Evaluation (Signed)
Anesthesia Post Note  Patient: KATLYNE NISHIDA  Procedure(s) Performed: Procedure(s) (LRB): ARTERIOVENOUS (AV) FISTULA CREATION USING GORETEX STRETCH GRAFT (Left)  Patient location during evaluation: PACU Anesthesia Type: General Level of consciousness: awake and alert Pain management: pain level controlled Vital Signs Assessment: post-procedure vital signs reviewed and stable Respiratory status: spontaneous breathing, nonlabored ventilation and respiratory function stable Cardiovascular status: blood pressure returned to baseline and stable Postop Assessment: no signs of nausea or vomiting Anesthetic complications: no       Last Vitals:  Vitals:   12/30/16 1212 12/30/16 1225  BP:  106/75  Pulse: 79 70  Resp: 19 14  Temp:  36.7 C    Last Pain:  Vitals:   12/30/16 1212  TempSrc:   PainSc: 6                  Leighanna Kirn,W. EDMOND

## 2016-12-30 NOTE — Anesthesia Procedure Notes (Signed)
Procedure Name: LMA Insertion Date/Time: 12/30/2016 10:15 AM Performed by: Scheryl Darter Pre-anesthesia Checklist: Patient identified, Emergency Drugs available, Suction available and Patient being monitored Patient Re-evaluated:Patient Re-evaluated prior to inductionOxygen Delivery Method: Circle System Utilized Preoxygenation: Pre-oxygenation with 100% oxygen Intubation Type: IV induction Ventilation: Mask ventilation without difficulty LMA: LMA inserted LMA Size: 4.0 Number of attempts: 1 Placement Confirmation: positive ETCO2 Tube secured with: Tape Dental Injury: Teeth and Oropharynx as per pre-operative assessment  Comments: Placed by Dr Jefm Petty

## 2016-12-30 NOTE — Progress Notes (Signed)
Pt has pre existing Diatek right upper chest, both ports capped & clamped, gauze dsg CDI/ occlusive.

## 2016-12-30 NOTE — Op Note (Signed)
Procedure: Left Upper Arm AV graft  Preop: ESRD  Postop: ESRD  Anesthesia: General  Asst: Gerri Lins, PA-c  Findings:4-7 mm PTFE end to side to axillary vein   Procedure Details: The left upper extremity was prepped and draped in usual sterile fashion.  A transverse incision was then made near the antecubital crease the left arm.  There were no suitable antecubital veins for outflow.   The incision was carried into the subcutaneous tissues down to level of the brachial artery.  Next the brachial artery was dissected free in the medial portion incision. The artery was  2-3 mm in diameter. The vessel loops were placed proximal and distal to the planned site of arteriotomy. At this point, a longitudinal incision was made in the axilla and carried through the subcutaneous tissues and fascia to expose the axillary vein.  The nerves were protected.  The vein was approximately 3.5 mm in diameter. Next, a subcutaneous tunnel was created connecting the upper arm to the lower arm incision in an arcing configuration over the biceps muscle.  A 4-7 mm PTFE graft was then brought through this subcutaneous tunnel. The patient was given 5000 units of intravenous heparin. After appropriate circulation time, the vessel loops were used to control the artery. A longitudinal opening was made in the right brachial artery.  The 4 mm end of the graft was beveled and sewn end to side to the artery using a 6 0 prolene.  At completion of the anastomosis the artery was forward bled, backbled and thoroughly flushed.  The anastomosis was secured, vessel loops were released and there was palpable pulse in the graft.  The graft was clamped just above the arterial anastomosis with a fistula clamp. The graft was then pulled taut to length at the axillary incision.  The axillary vein was controlled with a fine bulldog clamp in the upper axilla.  The vein was opened longitudinally.  The distal end of the graft was then beveled and  sewn end to side to the vein using a running 6 0 prolene.  Just prior to completion of the anastomosis, everything was forward bled, back bled and thoroughly flushed.  The anastomosis was secured and the fistula clamp removed from the proximal graft.  A thrill was immediately palpable in the graft. The patient was given 50 mg of protamine to assist with hemostasis.  After hemostasis was obtained, the subcutaneous tissues were reapproximated using a running 3-0 Vicryl suture. The skin was then closed with a 4 0 Vicryl subcuticular stitch. Dermabond was applied to the skin incisions.  The patient tolerated the procedure well and there were no complications.  Instrument sponge and needle count was correct at the end of the case.  The patient was taken to the recovery room in stable condition.  Ruta Hinds, MD [facility] [place] Office: [phone] Pager: [phone]

## 2016-12-30 NOTE — H&P (View-Only) (Signed)
[facility] [place]  Patient name: Melody [name] MRN: [id number] DOB: [birth year] Sex: female  REASON FOR VISIT: new access  HPI: Melody [name] is a 41 y.o. female who presents for evaluation for new dialysis access. The patient recently underwent right radiocephalic AV fistula creation on 09/15/2016. This was reported to be clotted 4 weeks ago. Her fistula was never used. She is currently dialyzing via a right IJ tunneled dialysis catheter on Mondays, Wednesdays and Fridays. The patient is right-handed. She has not had any access procedures in the left arm.  Her past medical history includes hypertension, drug abuse and tobacco abuse. She is not on blood thinners. Denies any chest pain. Does report occasional shortness of breath during dialysis.   Past Medical History:  Diagnosis Date  . Alcohol abuse   . Drug abuse   . Ectopic pregnancy   . Hypertension Dx May 2016  . Tobacco abuse     Family History  Problem Relation Age of Onset  . CAD Mother   . Hypertension Mother   . Heart disease Mother   . Kidney failure Mother   . CAD Brother   . Hypertension Other   . Kidney failure Other   . Hypertension Maternal Grandmother   . Kidney failure Maternal Grandmother   . Congestive Heart Failure Maternal Grandmother     SOCIAL HISTORY: Social History  Substance Use Topics  . Smoking status: Former Smoker    Packs/day: 0.00    Years: 0.00    Types: Cigars, Cigarettes    Start date: 09/11/2016  . Smokeless tobacco: Former Systems developer  . Alcohol use 0.0 oz/week     Comment: socially    Allergies  Allergen Reactions  . Acetaminophen     Tylenol #3 Causes nightmares  . Tramadol Hives    Current Outpatient Prescriptions  Medication Sig Dispense Refill  . amLODipine (NORVASC) 10 MG tablet Take 1 tablet (10 mg total) by mouth at bedtime. 30 tablet 0  . calcium acetate (PHOSLO) 667 MG capsule Take 2 capsules (1,334 mg total) by mouth 3 (three) times  daily with meals. 180 capsule 0  . lisinopril (PRINIVIL,ZESTRIL) 10 MG tablet Take 1 tablet (10 mg total) by mouth at bedtime. 30 tablet 0  . Darbepoetin Alfa (ARANESP) 60 MCG/0.3ML SOSY injection Inject 0.3 mLs (60 mcg total) into the vein every Tuesday with hemodialysis. (Patient not taking: Reported on 12/25/2016) 4.2 mL   . hydrALAZINE (APRESOLINE) 50 MG tablet Take 1 tablet (50 mg total) by mouth every 8 (eight) hours. (Patient not taking: Reported on 12/25/2016) 90 tablet 0  . labetalol (NORMODYNE) 200 MG tablet Take 1 tablet (200 mg total) by mouth 2 (two) times daily. (Patient not taking: Reported on 12/25/2016) 60 tablet 0   No current facility-administered medications for this visit.     REVIEW OF SYSTEMS:  [X]  denotes positive finding, [ ]  denotes negative finding Cardiac  Comments:  Chest pain or chest pressure:    Shortness of breath upon exertion: x Occasional shortness of breath with dialysis  Short of breath when lying flat:    Irregular heart rhythm:        Vascular    Pain in calf, thigh, or hip brought on by ambulation:    Pain in feet at night that wakes you up from your sleep:     Blood clot in your veins:    Leg swelling:         Pulmonary  Oxygen at home:    Productive cough:     Wheezing:         Neurologic    Sudden weakness in arms or legs:     Sudden numbness in arms or legs:     Sudden onset of difficulty speaking or slurred speech:    Temporary loss of vision in one eye:     Problems with dizziness:         Gastrointestinal    Blood in stool:     Vomited blood:         Genitourinary    Burning when urinating:     Blood in urine:        Psychiatric    Major depression:         Hematologic    Bleeding problems:    Problems with blood clotting too easily:        Skin    Rashes or ulcers:        Constitutional    Fever or chills:      PHYSICAL EXAM: Vitals:   12/25/16 1253  BP: 117/89  Pulse: 66  Resp: 20  Temp: 97.8 F (36.6 C)    TempSrc: Oral  SpO2: 96%  Weight: 140 lb 6.4 oz (63.7 kg)  Height: 5\' 6"  (1.676 m)    GENERAL: The patient is a well-nourished female, in no acute distress. The vital signs are documented above. HEENT: normocephalic, atraumatic. No abnormalities noted.  CARDIAC: There is a regular rate and rhythm. No carotid bruits.  VASCULAR: 2+ radial and brachial pulses bilaterally.  PULMONARY: There is good air exchange bilaterally without wheezing or rales. MUSCULOSKELETAL: There are no major deformities or cyanosis. NEUROLOGIC: No focal weakness or paresthesias are detected. SKIN: There are no ulcers or rashes noted. PSYCHIATRIC: The patient has a normal affect.  DATA:  Upper extremity vein mapping 12/17/16  Right: adequate right basilic vein Left: adequate left cephalic vein, adequate left basilic vein  MEDICAL ISSUES: ESRD on HD  The patient's right radial-cephalic fistula has clotted. Currently dialyzing via right IJ TDC on Mondays, Wednesdays and Fridays. Patient is right handed. Has adequate vein for left brachial-cephalic AV fistula. This will be scheduled for 12/30/16 with Dr. Oneida Alar who also saw and evaluated patient.   Virgina Jock, PA-C [facility] MD: Oneida Alar

## 2016-12-30 NOTE — Interval H&P Note (Signed)
History and Physical Interval Note:  12/30/2016 7:54 AM  Melody [name]  has presented today for surgery, with the diagnosis of End stage renal disease N18.6  The various methods of treatment have been discussed with the patient and family. After consideration of risks, benefits and other options for treatment, the patient has consented to  Procedure(s): ARTERIOVENOUS (AV) FISTULA CREATION (Left) as a surgical intervention .  The patient's history has been reviewed, patient examined, no change in status, stable for surgery.  I have reviewed the patient's chart and labs.  Questions were answered to the patient's satisfaction.     Ruta Hinds

## 2016-12-30 NOTE — Transfer of Care (Signed)
Immediate Anesthesia Transfer of Care Note  Patient: Melody [name]  Procedure(s) Performed: Procedure(s): ARTERIOVENOUS (AV) FISTULA CREATION USING GORETEX STRETCH GRAFT (Left)  Patient Location: PACU  Anesthesia Type:General  Level of Consciousness: awake, alert , oriented and sedated  Airway & Oxygen Therapy: Patient Spontanous Breathing and Patient connected to nasal cannula oxygen  Post-op Assessment: Report given to RN, Post -op Vital signs reviewed and stable and Patient moving all extremities  Post vital signs: Reviewed and stable  Last Vitals:  Vitals:   12/30/16 0825 12/30/16 1200  BP: (!) 139/99 106/69  Pulse: 71 93  Resp: 20 18  Temp: 36.8 C 36.9 C    Last Pain:  Vitals:   12/30/16 1200  TempSrc:   PainSc: 0-No pain      Patients Stated Pain Goal: 4 (96/43/83 8184)  Complications: No apparent anesthesia complications

## 2016-12-31 ENCOUNTER — Encounter (HOSPITAL_COMMUNITY): Payer: Self-pay | Admitting: Vascular Surgery

## 2016-12-31 DIAGNOSIS — D631 Anemia in chronic kidney disease: Secondary | ICD-10-CM | POA: Diagnosis not present

## 2016-12-31 DIAGNOSIS — N186 End stage renal disease: Secondary | ICD-10-CM | POA: Diagnosis not present

## 2016-12-31 DIAGNOSIS — N2581 Secondary hyperparathyroidism of renal origin: Secondary | ICD-10-CM | POA: Diagnosis not present

## 2016-12-31 DIAGNOSIS — D509 Iron deficiency anemia, unspecified: Secondary | ICD-10-CM | POA: Diagnosis not present

## 2017-01-02 DIAGNOSIS — N186 End stage renal disease: Secondary | ICD-10-CM | POA: Diagnosis not present

## 2017-01-02 DIAGNOSIS — N2581 Secondary hyperparathyroidism of renal origin: Secondary | ICD-10-CM | POA: Diagnosis not present

## 2017-01-02 DIAGNOSIS — D631 Anemia in chronic kidney disease: Secondary | ICD-10-CM | POA: Diagnosis not present

## 2017-01-02 DIAGNOSIS — D509 Iron deficiency anemia, unspecified: Secondary | ICD-10-CM | POA: Diagnosis not present

## 2017-01-04 ENCOUNTER — Encounter (HOSPITAL_COMMUNITY): Payer: Self-pay | Admitting: Emergency Medicine

## 2017-01-04 ENCOUNTER — Emergency Department (HOSPITAL_COMMUNITY): Payer: Medicare Other

## 2017-01-04 DIAGNOSIS — R079 Chest pain, unspecified: Secondary | ICD-10-CM | POA: Diagnosis not present

## 2017-01-04 DIAGNOSIS — Z79899 Other long term (current) drug therapy: Secondary | ICD-10-CM | POA: Diagnosis not present

## 2017-01-04 DIAGNOSIS — N185 Chronic kidney disease, stage 5: Secondary | ICD-10-CM | POA: Diagnosis not present

## 2017-01-04 DIAGNOSIS — R072 Precordial pain: Secondary | ICD-10-CM | POA: Diagnosis not present

## 2017-01-04 DIAGNOSIS — M542 Cervicalgia: Secondary | ICD-10-CM | POA: Insufficient documentation

## 2017-01-04 DIAGNOSIS — I120 Hypertensive chronic kidney disease with stage 5 chronic kidney disease or end stage renal disease: Secondary | ICD-10-CM | POA: Diagnosis not present

## 2017-01-04 DIAGNOSIS — Z87891 Personal history of nicotine dependence: Secondary | ICD-10-CM | POA: Insufficient documentation

## 2017-01-04 LAB — I-STAT TROPONIN, ED: Troponin i, poc: 0.01 ng/mL (ref 0.00–0.08)

## 2017-01-04 LAB — CBC
HEMATOCRIT: 30.5 % — AB (ref 36.0–46.0)
HEMOGLOBIN: 10 g/dL — AB (ref 12.0–15.0)
MCH: 29.9 pg (ref 26.0–34.0)
MCHC: 32.8 g/dL (ref 30.0–36.0)
MCV: 91 fL (ref 78.0–100.0)
Platelets: 217 10*3/uL (ref 150–400)
RBC: 3.35 MIL/uL — ABNORMAL LOW (ref 3.87–5.11)
RDW: 13.3 % (ref 11.5–15.5)
WBC: 8.5 10*3/uL (ref 4.0–10.5)

## 2017-01-04 LAB — BASIC METABOLIC PANEL
ANION GAP: 12 (ref 5–15)
BUN: 56 mg/dL — ABNORMAL HIGH (ref 6–20)
CALCIUM: 9.5 mg/dL (ref 8.9–10.3)
CO2: 26 mmol/L (ref 22–32)
Chloride: 100 mmol/L — ABNORMAL LOW (ref 101–111)
Creatinine, Ser: 8.71 mg/dL — ABNORMAL HIGH (ref 0.44–1.00)
GFR calc Af Amer: 6 mL/min — ABNORMAL LOW (ref >60)
GFR calc non Af Amer: 5 mL/min — ABNORMAL LOW (ref >60)
Glucose, Bld: 89 mg/dL (ref 65–99)
POTASSIUM: 3.7 mmol/L (ref 3.5–5.1)
Sodium: 138 mmol/L (ref 135–145)

## 2017-01-04 NOTE — ED Triage Notes (Signed)
Pt from home with complaints of right sided chest pain that began last night and spreads up to her neck and face. Pt describes it as sharp and throbbing. Pt is ambulatory with no distress.

## 2017-01-05 ENCOUNTER — Emergency Department (HOSPITAL_COMMUNITY)
Admission: EM | Admit: 2017-01-05 | Discharge: 2017-01-05 | Disposition: A | Discharge status: Home / Self Care | Payer: Medicare Other | Attending: Emergency Medicine | Admitting: Emergency Medicine

## 2017-01-05 DIAGNOSIS — R072 Precordial pain: Secondary | ICD-10-CM

## 2017-01-05 DIAGNOSIS — N186 End stage renal disease: Secondary | ICD-10-CM | POA: Diagnosis not present

## 2017-01-05 DIAGNOSIS — N2581 Secondary hyperparathyroidism of renal origin: Secondary | ICD-10-CM | POA: Diagnosis not present

## 2017-01-05 DIAGNOSIS — M542 Cervicalgia: Secondary | ICD-10-CM

## 2017-01-05 DIAGNOSIS — D631 Anemia in chronic kidney disease: Secondary | ICD-10-CM | POA: Diagnosis not present

## 2017-01-05 DIAGNOSIS — D509 Iron deficiency anemia, unspecified: Secondary | ICD-10-CM | POA: Diagnosis not present

## 2017-01-05 MED ORDER — METHOCARBAMOL 500 MG PO TABS
500.0000 mg | ORAL_TABLET | Freq: Once | ORAL | Status: AC
  Administered 2017-01-05: 500 mg via ORAL
  Filled 2017-01-05: qty 1

## 2017-01-05 MED ORDER — METHOCARBAMOL 500 MG PO TABS: 500.0000 mg | ORAL_TABLET | Freq: Three times a day (TID) | ORAL | 0 refills | Status: DC | PRN

## 2017-01-05 NOTE — ED Provider Notes (Signed)
[facility] DEPT Provider Note   CSN: [id number] Arrival date & time: 01/04/17  2243  By signing my name below, I, Margit Banda, attest that this documentation has been prepared under the direction and in the presence of Jola Schmidt, MD. Electronically Signed: Margit Banda, ED Scribe. 01/05/17. 2:04 AM.   History   Chief Complaint Chief Complaint  Patient presents with  . Chest Pain  . Facial Pain  . Neck Pain    HPI Melody [name] is a 41 y.o. female who presents to the Emergency Department complaining of constant, sharp, throbbing, right sided CP pain that started ~ 2 days ago. Pain radiates to her neck, mouth and face. Associated sx include sore throat, and loss of appetite. She hasn't tried anything at home for pain. Pt denies fever.  The history is provided by the patient. No language interpreter was used.    Past Medical History:  Diagnosis Date  . Alcohol abuse   . Chronic kidney disease    staqge 5  . Drug abuse   . Ectopic pregnancy   . Headache    Migraines  . Hypertension Dx May 2016  . Tobacco abuse     Patient Active Problem List   Diagnosis Date Noted  . Acute respiratory failure with hypoxia ([place])   . Anemia of chronic disease   . Thrombocytopenia ([place])   . Acute renal failure (ARF) ([place])   . Hypertensive urgency 09/13/2016  . Hypertensive emergency 09/12/2016  . Pelvic fullness in female 10/12/2015  . Accelerated hypertension 03/12/2015  . AKI (acute kidney injury) ([place]) 01/14/2015  . Tobacco abuse   . Drug abuse     Past Surgical History:  Procedure Laterality Date  . AV FISTULA PLACEMENT Right 09/15/2016   Procedure: ARTERIOVENOUS (AV) FISTULA CREATION UPPER RIGHT ARM;  Surgeon: Rosetta Posner, MD;  Location: [facility] OR;  Service: Vascular;  Laterality: Right;  . AV FISTULA PLACEMENT Left 12/30/2016   Procedure: ARTERIOVENOUS (AV) FISTULA CREATION USING GORETEX STRETCH GRAFT;  Surgeon: Elam Dutch, MD;  Location: [facility];  Service:  Vascular;  Laterality: Left;  . DILATION AND CURETTAGE OF UTERUS    . INSERTION OF DIALYSIS CATHETER Right 09/15/2016   Procedure: INSERTION OF DIALYSIS CATHETER ON RIGHT SIDE;  Surgeon: Rosetta Posner, MD;  Location: [facility] OR;  Service: Vascular;  Laterality: Right;    OB History    No data available       Home Medications    Prior to Admission medications   Medication Sig Start Date End Date Taking? Authorizing Provider  amLODipine (NORVASC) 5 MG tablet Take 1 tablet by mouth at bedtime.  12/02/16   [provider]  calcium acetate (PHOSLO) 667 MG capsule Take 2 capsules (1,334 mg total) by mouth 3 (three) times daily with meals. 09/19/16   Verlee Monte, MD  labetalol (NORMODYNE) 300 MG tablet Take 1 tablet by mouth 2 (two) times daily.  10/14/16   [provider]  lisinopril (PRINIVIL,ZESTRIL) 30 MG tablet Take 1 tablet by mouth at bedtime.  12/02/16   [provider]  oxyCODONE (OXY IR/ROXICODONE) 5 MG immediate release tablet Take 1 tablet (5 mg total) by mouth every 4 (four) hours as needed for severe pain. 12/30/16   Ulyses Amor, PA-C    Family History Family History  Problem Relation Age of Onset  . CAD Mother   . Hypertension Mother   . Heart disease Mother   . Kidney failure Mother   .  CAD Brother   . Hypertension Other   . Kidney failure Other   . Hypertension Maternal Grandmother   . Kidney failure Maternal Grandmother   . Congestive Heart Failure Maternal Grandmother     Social History Social History  Substance Use Topics  . Smoking status: Former Smoker    Packs/day: 0.00    Years: 0.00    Types: Cigars, Cigarettes    Start date: 09/11/2016  . Smokeless tobacco: Former Systems developer  . Alcohol use No     Allergies   Acetaminophen and Tramadol   Review of Systems Review of Systems  A complete 10 system review of systems was obtained and all systems are negative except as noted in the HPI and PMH.    Physical Exam Updated Vital  Signs BP 117/81 (BP Location: Right Arm)   Pulse 92   Temp 98.6 F (37 C) (Oral)   Resp 20   Ht 5\' 5"  (1.651 m)   Wt 140 lb (63.5 kg)   LMP 12/12/2016 (Approximate)   SpO2 100%   BMI 23.30 kg/m   Physical Exam  Constitutional: She is oriented to person, place, and time. She appears well-developed and well-nourished. No distress.  HENT:  Head: Normocephalic and atraumatic.  Posterior pharynx is normal.  Dentition is without significant abnormality.  Tolerating secretions.  Eyes: EOM are normal.  Neck: Normal range of motion. Neck supple. No tracheal deviation present.  Mild tenderness of the right sternocleidomastoid muscle.  No erythema warmth or crepitus over this area.  Cardiovascular: Normal rate and regular rhythm.   Pulmonary/Chest: Effort normal and breath sounds normal. No stridor.  Permacath right upper chest without surrounding signs of infection  Abdominal: Soft. She exhibits no distension.  Musculoskeletal: Normal range of motion.  Lymphadenopathy:    She has no cervical adenopathy.  Neurological: She is alert and oriented to person, place, and time.  Skin: Skin is warm and dry.  Psychiatric: She has a normal mood and affect. Judgment normal.  Nursing note and vitals reviewed.    ED Treatments / Results  DIAGNOSTIC STUDIES: Oxygen Saturation is 100% on RA, normal by my interpretation.   COORDINATION OF CARE: 2:04 AM-Discussed next steps with pt. Pt verbalized understanding and is agreeable with the plan.    Labs (all labs ordered are listed, but only abnormal results are displayed) Labs Reviewed  BASIC METABOLIC PANEL - Abnormal; Notable for the following:       Result Value   Chloride 100 (*)    BUN 56 (*)    Creatinine, Ser 8.71 (*)    GFR calc non Af Amer 5 (*)    GFR calc Af Amer 6 (*)    All other components within normal limits  CBC - Abnormal; Notable for the following:    RBC 3.35 (*)    Hemoglobin 10.0 (*)    HCT 30.5 (*)    All other  components within normal limits  I-STAT TROPOININ, ED    EKG  EKG Interpretation  Date/Time:  Sunday Jan 04 2017 23:11:37 EDT Ventricular Rate:  72 PR Interval:    QRS Duration: 113 QT Interval:  397 QTC Calculation: 435 R Axis:   2 Text Interpretation:  Sinus rhythm Probable left atrial enlargement Borderline intraventricular conduction delay RSR' in V1 or V2, right VCD or RVH Borderline T abnormalities, anterior leads nonspecific T wave changes compared to earlier ecg Confirmed by Brallan Denio  MD, Kaeleen Odom ([id number]) on 01/05/2017 2:16:23 AM  Radiology Dg Chest 2 View  Result Date: 01/04/2017 CLINICAL DATA:  Acute chest pain. EXAM: CHEST  2 VIEW COMPARISON:  09/15/2016 and prior chest radiographs FINDINGS: Cardiomegaly again noted. A right central venous catheter is again noted with tip overlying the right atrium. There is no evidence of focal airspace disease, pulmonary edema, suspicious pulmonary nodule/mass, pleural effusion, or pneumothorax. No acute bony abnormalities are identified. IMPRESSION: Cardiomegaly without evidence of acute cardiopulmonary disease. Electronically Signed   By: Margarette Canada M.D.   On: 01/04/2017 23:32    Procedures Procedures (including critical care time)  Medications Ordered in ED Medications  methocarbamol (ROBAXIN) tablet 500 mg (not administered)     Initial Impression / Assessment and Plan / ED Course  I have reviewed the triage vital signs and the nursing notes.  Pertinent labs & imaging results that were available during my care of the patient were reviewed by me and considered in my medical decision making (see chart for details).     Her pain is worse with movement of her neck towards the right.  She has no midline C-spine tenderness.  This is likely spasm and strain of her sternocleidomastoid muscle.  Doubt ACS.  Doubt PE.  Workup otherwise without significant abnormality.  Doubt odontogenic infection  Final Clinical Impressions(s) / ED  Diagnoses   Final diagnoses:  Acute neck pain  Precordial pain    New Prescriptions New Prescriptions   No medications on file   I personally performed the services described in this documentation, which was scribed in my presence. The recorded information has been reviewed and is accurate.       Jola Schmidt, MD 01/05/17 [phone]

## 2017-01-07 DIAGNOSIS — N2581 Secondary hyperparathyroidism of renal origin: Secondary | ICD-10-CM | POA: Diagnosis not present

## 2017-01-07 DIAGNOSIS — N186 End stage renal disease: Secondary | ICD-10-CM | POA: Diagnosis not present

## 2017-01-07 DIAGNOSIS — D631 Anemia in chronic kidney disease: Secondary | ICD-10-CM | POA: Diagnosis not present

## 2017-01-07 DIAGNOSIS — D509 Iron deficiency anemia, unspecified: Secondary | ICD-10-CM | POA: Diagnosis not present

## 2017-01-08 ENCOUNTER — Ambulatory Visit: Payer: Medicaid Other

## 2017-01-09 DIAGNOSIS — N186 End stage renal disease: Secondary | ICD-10-CM | POA: Diagnosis not present

## 2017-01-09 DIAGNOSIS — D509 Iron deficiency anemia, unspecified: Secondary | ICD-10-CM | POA: Diagnosis not present

## 2017-01-09 DIAGNOSIS — D631 Anemia in chronic kidney disease: Secondary | ICD-10-CM | POA: Diagnosis not present

## 2017-01-09 DIAGNOSIS — N2581 Secondary hyperparathyroidism of renal origin: Secondary | ICD-10-CM | POA: Diagnosis not present

## 2017-01-12 DIAGNOSIS — D509 Iron deficiency anemia, unspecified: Secondary | ICD-10-CM | POA: Diagnosis not present

## 2017-01-12 DIAGNOSIS — N186 End stage renal disease: Secondary | ICD-10-CM | POA: Diagnosis not present

## 2017-01-12 DIAGNOSIS — D631 Anemia in chronic kidney disease: Secondary | ICD-10-CM | POA: Diagnosis not present

## 2017-01-12 DIAGNOSIS — N2581 Secondary hyperparathyroidism of renal origin: Secondary | ICD-10-CM | POA: Diagnosis not present

## 2017-01-13 ENCOUNTER — Ambulatory Visit: Payer: Medicaid Other | Admitting: Gastroenterology

## 2017-01-13 ENCOUNTER — Other Ambulatory Visit: Payer: Self-pay

## 2017-01-14 DIAGNOSIS — N2581 Secondary hyperparathyroidism of renal origin: Secondary | ICD-10-CM | POA: Diagnosis not present

## 2017-01-14 DIAGNOSIS — N186 End stage renal disease: Secondary | ICD-10-CM | POA: Diagnosis not present

## 2017-01-14 DIAGNOSIS — D631 Anemia in chronic kidney disease: Secondary | ICD-10-CM | POA: Diagnosis not present

## 2017-01-14 DIAGNOSIS — D509 Iron deficiency anemia, unspecified: Secondary | ICD-10-CM | POA: Diagnosis not present

## 2017-01-16 DIAGNOSIS — D509 Iron deficiency anemia, unspecified: Secondary | ICD-10-CM | POA: Diagnosis not present

## 2017-01-16 DIAGNOSIS — N2581 Secondary hyperparathyroidism of renal origin: Secondary | ICD-10-CM | POA: Diagnosis not present

## 2017-01-16 DIAGNOSIS — D631 Anemia in chronic kidney disease: Secondary | ICD-10-CM | POA: Diagnosis not present

## 2017-01-16 DIAGNOSIS — N186 End stage renal disease: Secondary | ICD-10-CM | POA: Diagnosis not present

## 2017-01-19 DIAGNOSIS — N186 End stage renal disease: Secondary | ICD-10-CM | POA: Diagnosis not present

## 2017-01-19 DIAGNOSIS — D631 Anemia in chronic kidney disease: Secondary | ICD-10-CM | POA: Diagnosis not present

## 2017-01-19 DIAGNOSIS — N2581 Secondary hyperparathyroidism of renal origin: Secondary | ICD-10-CM | POA: Diagnosis not present

## 2017-01-19 DIAGNOSIS — D509 Iron deficiency anemia, unspecified: Secondary | ICD-10-CM | POA: Diagnosis not present

## 2017-01-21 DIAGNOSIS — N2581 Secondary hyperparathyroidism of renal origin: Secondary | ICD-10-CM | POA: Diagnosis not present

## 2017-01-21 DIAGNOSIS — D509 Iron deficiency anemia, unspecified: Secondary | ICD-10-CM | POA: Diagnosis not present

## 2017-01-21 DIAGNOSIS — D631 Anemia in chronic kidney disease: Secondary | ICD-10-CM | POA: Diagnosis not present

## 2017-01-21 DIAGNOSIS — N186 End stage renal disease: Secondary | ICD-10-CM | POA: Diagnosis not present

## 2017-01-22 DIAGNOSIS — Z992 Dependence on renal dialysis: Secondary | ICD-10-CM | POA: Diagnosis not present

## 2017-01-22 DIAGNOSIS — I129 Hypertensive chronic kidney disease with stage 1 through stage 4 chronic kidney disease, or unspecified chronic kidney disease: Secondary | ICD-10-CM | POA: Diagnosis not present

## 2017-01-22 DIAGNOSIS — N186 End stage renal disease: Secondary | ICD-10-CM | POA: Diagnosis not present

## 2017-01-23 DIAGNOSIS — N186 End stage renal disease: Secondary | ICD-10-CM | POA: Diagnosis not present

## 2017-01-23 DIAGNOSIS — D631 Anemia in chronic kidney disease: Secondary | ICD-10-CM | POA: Diagnosis not present

## 2017-01-23 DIAGNOSIS — D509 Iron deficiency anemia, unspecified: Secondary | ICD-10-CM | POA: Diagnosis not present

## 2017-01-23 DIAGNOSIS — N2581 Secondary hyperparathyroidism of renal origin: Secondary | ICD-10-CM | POA: Diagnosis not present

## 2017-01-26 DIAGNOSIS — N2581 Secondary hyperparathyroidism of renal origin: Secondary | ICD-10-CM | POA: Diagnosis not present

## 2017-01-26 DIAGNOSIS — D631 Anemia in chronic kidney disease: Secondary | ICD-10-CM | POA: Diagnosis not present

## 2017-01-26 DIAGNOSIS — D509 Iron deficiency anemia, unspecified: Secondary | ICD-10-CM | POA: Diagnosis not present

## 2017-01-26 DIAGNOSIS — N186 End stage renal disease: Secondary | ICD-10-CM | POA: Diagnosis not present

## 2017-01-30 DIAGNOSIS — D509 Iron deficiency anemia, unspecified: Secondary | ICD-10-CM | POA: Diagnosis not present

## 2017-01-30 DIAGNOSIS — D631 Anemia in chronic kidney disease: Secondary | ICD-10-CM | POA: Diagnosis not present

## 2017-01-30 DIAGNOSIS — N2581 Secondary hyperparathyroidism of renal origin: Secondary | ICD-10-CM | POA: Diagnosis not present

## 2017-01-30 DIAGNOSIS — N186 End stage renal disease: Secondary | ICD-10-CM | POA: Diagnosis not present

## 2017-02-02 DIAGNOSIS — N2581 Secondary hyperparathyroidism of renal origin: Secondary | ICD-10-CM | POA: Diagnosis not present

## 2017-02-02 DIAGNOSIS — D631 Anemia in chronic kidney disease: Secondary | ICD-10-CM | POA: Diagnosis not present

## 2017-02-02 DIAGNOSIS — D509 Iron deficiency anemia, unspecified: Secondary | ICD-10-CM | POA: Diagnosis not present

## 2017-02-02 DIAGNOSIS — N186 End stage renal disease: Secondary | ICD-10-CM | POA: Diagnosis not present

## 2017-02-04 DIAGNOSIS — N186 End stage renal disease: Secondary | ICD-10-CM | POA: Diagnosis not present

## 2017-02-04 DIAGNOSIS — N2581 Secondary hyperparathyroidism of renal origin: Secondary | ICD-10-CM | POA: Diagnosis not present

## 2017-02-04 DIAGNOSIS — D631 Anemia in chronic kidney disease: Secondary | ICD-10-CM | POA: Diagnosis not present

## 2017-02-04 DIAGNOSIS — D509 Iron deficiency anemia, unspecified: Secondary | ICD-10-CM | POA: Diagnosis not present

## 2017-02-06 DIAGNOSIS — D509 Iron deficiency anemia, unspecified: Secondary | ICD-10-CM | POA: Diagnosis not present

## 2017-02-06 DIAGNOSIS — N2581 Secondary hyperparathyroidism of renal origin: Secondary | ICD-10-CM | POA: Diagnosis not present

## 2017-02-06 DIAGNOSIS — D631 Anemia in chronic kidney disease: Secondary | ICD-10-CM | POA: Diagnosis not present

## 2017-02-06 DIAGNOSIS — N186 End stage renal disease: Secondary | ICD-10-CM | POA: Diagnosis not present

## 2017-02-09 DIAGNOSIS — D509 Iron deficiency anemia, unspecified: Secondary | ICD-10-CM | POA: Diagnosis not present

## 2017-02-09 DIAGNOSIS — D631 Anemia in chronic kidney disease: Secondary | ICD-10-CM | POA: Diagnosis not present

## 2017-02-09 DIAGNOSIS — N186 End stage renal disease: Secondary | ICD-10-CM | POA: Diagnosis not present

## 2017-02-09 DIAGNOSIS — N2581 Secondary hyperparathyroidism of renal origin: Secondary | ICD-10-CM | POA: Diagnosis not present

## 2017-02-10 ENCOUNTER — Ambulatory Visit: Payer: Medicare Other

## 2017-02-11 DIAGNOSIS — N2581 Secondary hyperparathyroidism of renal origin: Secondary | ICD-10-CM | POA: Diagnosis not present

## 2017-02-11 DIAGNOSIS — D631 Anemia in chronic kidney disease: Secondary | ICD-10-CM | POA: Diagnosis not present

## 2017-02-11 DIAGNOSIS — N186 End stage renal disease: Secondary | ICD-10-CM | POA: Diagnosis not present

## 2017-02-11 DIAGNOSIS — D509 Iron deficiency anemia, unspecified: Secondary | ICD-10-CM | POA: Diagnosis not present

## 2017-02-12 DIAGNOSIS — Z01818 Encounter for other preprocedural examination: Secondary | ICD-10-CM | POA: Insufficient documentation

## 2017-02-13 DIAGNOSIS — N2581 Secondary hyperparathyroidism of renal origin: Secondary | ICD-10-CM | POA: Diagnosis not present

## 2017-02-13 DIAGNOSIS — N186 End stage renal disease: Secondary | ICD-10-CM | POA: Diagnosis not present

## 2017-02-13 DIAGNOSIS — D631 Anemia in chronic kidney disease: Secondary | ICD-10-CM | POA: Diagnosis not present

## 2017-02-13 DIAGNOSIS — D509 Iron deficiency anemia, unspecified: Secondary | ICD-10-CM | POA: Diagnosis not present

## 2017-02-16 DIAGNOSIS — N186 End stage renal disease: Secondary | ICD-10-CM | POA: Diagnosis not present

## 2017-02-16 DIAGNOSIS — D631 Anemia in chronic kidney disease: Secondary | ICD-10-CM | POA: Diagnosis not present

## 2017-02-16 DIAGNOSIS — N2581 Secondary hyperparathyroidism of renal origin: Secondary | ICD-10-CM | POA: Diagnosis not present

## 2017-02-16 DIAGNOSIS — D509 Iron deficiency anemia, unspecified: Secondary | ICD-10-CM | POA: Diagnosis not present

## 2017-02-18 DIAGNOSIS — D509 Iron deficiency anemia, unspecified: Secondary | ICD-10-CM | POA: Diagnosis not present

## 2017-02-18 DIAGNOSIS — D631 Anemia in chronic kidney disease: Secondary | ICD-10-CM | POA: Diagnosis not present

## 2017-02-18 DIAGNOSIS — N2581 Secondary hyperparathyroidism of renal origin: Secondary | ICD-10-CM | POA: Diagnosis not present

## 2017-02-18 DIAGNOSIS — Z452 Encounter for adjustment and management of vascular access device: Secondary | ICD-10-CM | POA: Diagnosis not present

## 2017-02-18 DIAGNOSIS — N186 End stage renal disease: Secondary | ICD-10-CM | POA: Diagnosis not present

## 2017-02-20 DIAGNOSIS — D509 Iron deficiency anemia, unspecified: Secondary | ICD-10-CM | POA: Diagnosis not present

## 2017-02-20 DIAGNOSIS — N2581 Secondary hyperparathyroidism of renal origin: Secondary | ICD-10-CM | POA: Diagnosis not present

## 2017-02-20 DIAGNOSIS — D631 Anemia in chronic kidney disease: Secondary | ICD-10-CM | POA: Diagnosis not present

## 2017-02-20 DIAGNOSIS — N186 End stage renal disease: Secondary | ICD-10-CM | POA: Diagnosis not present

## 2017-02-21 DIAGNOSIS — I129 Hypertensive chronic kidney disease with stage 1 through stage 4 chronic kidney disease, or unspecified chronic kidney disease: Secondary | ICD-10-CM | POA: Diagnosis not present

## 2017-02-21 DIAGNOSIS — Z992 Dependence on renal dialysis: Secondary | ICD-10-CM | POA: Diagnosis not present

## 2017-02-21 DIAGNOSIS — N186 End stage renal disease: Secondary | ICD-10-CM | POA: Diagnosis not present

## 2017-02-23 DIAGNOSIS — D631 Anemia in chronic kidney disease: Secondary | ICD-10-CM | POA: Diagnosis not present

## 2017-02-23 DIAGNOSIS — N186 End stage renal disease: Secondary | ICD-10-CM | POA: Diagnosis not present

## 2017-02-23 DIAGNOSIS — N2581 Secondary hyperparathyroidism of renal origin: Secondary | ICD-10-CM | POA: Diagnosis not present

## 2017-02-25 DIAGNOSIS — N186 End stage renal disease: Secondary | ICD-10-CM | POA: Diagnosis not present

## 2017-02-25 DIAGNOSIS — N2581 Secondary hyperparathyroidism of renal origin: Secondary | ICD-10-CM | POA: Diagnosis not present

## 2017-02-25 DIAGNOSIS — D631 Anemia in chronic kidney disease: Secondary | ICD-10-CM | POA: Diagnosis not present

## 2017-02-27 DIAGNOSIS — N186 End stage renal disease: Secondary | ICD-10-CM | POA: Diagnosis not present

## 2017-02-27 DIAGNOSIS — N2581 Secondary hyperparathyroidism of renal origin: Secondary | ICD-10-CM | POA: Diagnosis not present

## 2017-02-27 DIAGNOSIS — D631 Anemia in chronic kidney disease: Secondary | ICD-10-CM | POA: Diagnosis not present

## 2017-03-02 DIAGNOSIS — D631 Anemia in chronic kidney disease: Secondary | ICD-10-CM | POA: Diagnosis not present

## 2017-03-02 DIAGNOSIS — N186 End stage renal disease: Secondary | ICD-10-CM | POA: Diagnosis not present

## 2017-03-02 DIAGNOSIS — N2581 Secondary hyperparathyroidism of renal origin: Secondary | ICD-10-CM | POA: Diagnosis not present

## 2017-03-04 DIAGNOSIS — D631 Anemia in chronic kidney disease: Secondary | ICD-10-CM | POA: Diagnosis not present

## 2017-03-04 DIAGNOSIS — N186 End stage renal disease: Secondary | ICD-10-CM | POA: Diagnosis not present

## 2017-03-04 DIAGNOSIS — N2581 Secondary hyperparathyroidism of renal origin: Secondary | ICD-10-CM | POA: Diagnosis not present

## 2017-03-06 DIAGNOSIS — N186 End stage renal disease: Secondary | ICD-10-CM | POA: Diagnosis not present

## 2017-03-06 DIAGNOSIS — N2581 Secondary hyperparathyroidism of renal origin: Secondary | ICD-10-CM | POA: Diagnosis not present

## 2017-03-06 DIAGNOSIS — D631 Anemia in chronic kidney disease: Secondary | ICD-10-CM | POA: Diagnosis not present

## 2017-03-09 ENCOUNTER — Ambulatory Visit: Payer: Medicare Other | Admitting: Gastroenterology

## 2017-03-09 ENCOUNTER — Other Ambulatory Visit: Payer: Self-pay

## 2017-03-09 DIAGNOSIS — N2581 Secondary hyperparathyroidism of renal origin: Secondary | ICD-10-CM | POA: Diagnosis not present

## 2017-03-09 DIAGNOSIS — D631 Anemia in chronic kidney disease: Secondary | ICD-10-CM | POA: Diagnosis not present

## 2017-03-09 DIAGNOSIS — N186 End stage renal disease: Secondary | ICD-10-CM | POA: Diagnosis not present

## 2017-03-11 DIAGNOSIS — N186 End stage renal disease: Secondary | ICD-10-CM | POA: Diagnosis not present

## 2017-03-11 DIAGNOSIS — D631 Anemia in chronic kidney disease: Secondary | ICD-10-CM | POA: Diagnosis not present

## 2017-03-11 DIAGNOSIS — N2581 Secondary hyperparathyroidism of renal origin: Secondary | ICD-10-CM | POA: Diagnosis not present

## 2017-03-13 DIAGNOSIS — N186 End stage renal disease: Secondary | ICD-10-CM | POA: Diagnosis not present

## 2017-03-13 DIAGNOSIS — D631 Anemia in chronic kidney disease: Secondary | ICD-10-CM | POA: Diagnosis not present

## 2017-03-13 DIAGNOSIS — N2581 Secondary hyperparathyroidism of renal origin: Secondary | ICD-10-CM | POA: Diagnosis not present

## 2017-03-16 DIAGNOSIS — N2581 Secondary hyperparathyroidism of renal origin: Secondary | ICD-10-CM | POA: Diagnosis not present

## 2017-03-16 DIAGNOSIS — N186 End stage renal disease: Secondary | ICD-10-CM | POA: Diagnosis not present

## 2017-03-16 DIAGNOSIS — D631 Anemia in chronic kidney disease: Secondary | ICD-10-CM | POA: Diagnosis not present

## 2017-03-18 DIAGNOSIS — D631 Anemia in chronic kidney disease: Secondary | ICD-10-CM | POA: Diagnosis not present

## 2017-03-18 DIAGNOSIS — N2581 Secondary hyperparathyroidism of renal origin: Secondary | ICD-10-CM | POA: Diagnosis not present

## 2017-03-18 DIAGNOSIS — N186 End stage renal disease: Secondary | ICD-10-CM | POA: Diagnosis not present

## 2017-03-19 DIAGNOSIS — N2581 Secondary hyperparathyroidism of renal origin: Secondary | ICD-10-CM | POA: Diagnosis not present

## 2017-03-19 DIAGNOSIS — D631 Anemia in chronic kidney disease: Secondary | ICD-10-CM | POA: Diagnosis not present

## 2017-03-19 DIAGNOSIS — N186 End stage renal disease: Secondary | ICD-10-CM | POA: Diagnosis not present

## 2017-03-23 DIAGNOSIS — D631 Anemia in chronic kidney disease: Secondary | ICD-10-CM | POA: Diagnosis not present

## 2017-03-23 DIAGNOSIS — N186 End stage renal disease: Secondary | ICD-10-CM | POA: Diagnosis not present

## 2017-03-23 DIAGNOSIS — N2581 Secondary hyperparathyroidism of renal origin: Secondary | ICD-10-CM | POA: Diagnosis not present

## 2017-03-24 DIAGNOSIS — Z992 Dependence on renal dialysis: Secondary | ICD-10-CM | POA: Diagnosis not present

## 2017-03-24 DIAGNOSIS — I129 Hypertensive chronic kidney disease with stage 1 through stage 4 chronic kidney disease, or unspecified chronic kidney disease: Secondary | ICD-10-CM | POA: Diagnosis not present

## 2017-03-24 DIAGNOSIS — N186 End stage renal disease: Secondary | ICD-10-CM | POA: Diagnosis not present

## 2017-03-25 DIAGNOSIS — D631 Anemia in chronic kidney disease: Secondary | ICD-10-CM | POA: Diagnosis not present

## 2017-03-25 DIAGNOSIS — N2581 Secondary hyperparathyroidism of renal origin: Secondary | ICD-10-CM | POA: Diagnosis not present

## 2017-03-25 DIAGNOSIS — N186 End stage renal disease: Secondary | ICD-10-CM | POA: Diagnosis not present

## 2017-03-27 DIAGNOSIS — N186 End stage renal disease: Secondary | ICD-10-CM | POA: Diagnosis not present

## 2017-03-27 DIAGNOSIS — N2581 Secondary hyperparathyroidism of renal origin: Secondary | ICD-10-CM | POA: Diagnosis not present

## 2017-03-27 DIAGNOSIS — D631 Anemia in chronic kidney disease: Secondary | ICD-10-CM | POA: Diagnosis not present

## 2017-03-30 DIAGNOSIS — N186 End stage renal disease: Secondary | ICD-10-CM | POA: Diagnosis not present

## 2017-03-30 DIAGNOSIS — N2581 Secondary hyperparathyroidism of renal origin: Secondary | ICD-10-CM | POA: Diagnosis not present

## 2017-03-30 DIAGNOSIS — D631 Anemia in chronic kidney disease: Secondary | ICD-10-CM | POA: Diagnosis not present

## 2017-04-01 DIAGNOSIS — D631 Anemia in chronic kidney disease: Secondary | ICD-10-CM | POA: Diagnosis not present

## 2017-04-01 DIAGNOSIS — N186 End stage renal disease: Secondary | ICD-10-CM | POA: Diagnosis not present

## 2017-04-01 DIAGNOSIS — N2581 Secondary hyperparathyroidism of renal origin: Secondary | ICD-10-CM | POA: Diagnosis not present

## 2017-04-03 DIAGNOSIS — N186 End stage renal disease: Secondary | ICD-10-CM | POA: Diagnosis not present

## 2017-04-03 DIAGNOSIS — D631 Anemia in chronic kidney disease: Secondary | ICD-10-CM | POA: Diagnosis not present

## 2017-04-03 DIAGNOSIS — N2581 Secondary hyperparathyroidism of renal origin: Secondary | ICD-10-CM | POA: Diagnosis not present

## 2017-04-06 DIAGNOSIS — I871 Compression of vein: Secondary | ICD-10-CM | POA: Diagnosis not present

## 2017-04-06 DIAGNOSIS — T82868A Thrombosis of vascular prosthetic devices, implants and grafts, initial encounter: Secondary | ICD-10-CM | POA: Diagnosis not present

## 2017-04-06 DIAGNOSIS — Z992 Dependence on renal dialysis: Secondary | ICD-10-CM | POA: Diagnosis not present

## 2017-04-06 DIAGNOSIS — N186 End stage renal disease: Secondary | ICD-10-CM | POA: Diagnosis not present

## 2017-04-08 DIAGNOSIS — N186 End stage renal disease: Secondary | ICD-10-CM | POA: Diagnosis not present

## 2017-04-08 DIAGNOSIS — N2581 Secondary hyperparathyroidism of renal origin: Secondary | ICD-10-CM | POA: Diagnosis not present

## 2017-04-08 DIAGNOSIS — D631 Anemia in chronic kidney disease: Secondary | ICD-10-CM | POA: Diagnosis not present

## 2017-04-10 DIAGNOSIS — D631 Anemia in chronic kidney disease: Secondary | ICD-10-CM | POA: Diagnosis not present

## 2017-04-10 DIAGNOSIS — N2581 Secondary hyperparathyroidism of renal origin: Secondary | ICD-10-CM | POA: Diagnosis not present

## 2017-04-10 DIAGNOSIS — N186 End stage renal disease: Secondary | ICD-10-CM | POA: Diagnosis not present

## 2017-04-13 DIAGNOSIS — D631 Anemia in chronic kidney disease: Secondary | ICD-10-CM | POA: Diagnosis not present

## 2017-04-13 DIAGNOSIS — N186 End stage renal disease: Secondary | ICD-10-CM | POA: Diagnosis not present

## 2017-04-13 DIAGNOSIS — N2581 Secondary hyperparathyroidism of renal origin: Secondary | ICD-10-CM | POA: Diagnosis not present

## 2017-04-15 DIAGNOSIS — N186 End stage renal disease: Secondary | ICD-10-CM | POA: Diagnosis not present

## 2017-04-15 DIAGNOSIS — N2581 Secondary hyperparathyroidism of renal origin: Secondary | ICD-10-CM | POA: Diagnosis not present

## 2017-04-15 DIAGNOSIS — D631 Anemia in chronic kidney disease: Secondary | ICD-10-CM | POA: Diagnosis not present

## 2017-04-17 DIAGNOSIS — N186 End stage renal disease: Secondary | ICD-10-CM | POA: Diagnosis not present

## 2017-04-17 DIAGNOSIS — D631 Anemia in chronic kidney disease: Secondary | ICD-10-CM | POA: Diagnosis not present

## 2017-04-17 DIAGNOSIS — N2581 Secondary hyperparathyroidism of renal origin: Secondary | ICD-10-CM | POA: Diagnosis not present

## 2017-04-20 DIAGNOSIS — N186 End stage renal disease: Secondary | ICD-10-CM | POA: Diagnosis not present

## 2017-04-20 DIAGNOSIS — N2581 Secondary hyperparathyroidism of renal origin: Secondary | ICD-10-CM | POA: Diagnosis not present

## 2017-04-20 DIAGNOSIS — D631 Anemia in chronic kidney disease: Secondary | ICD-10-CM | POA: Diagnosis not present

## 2017-04-22 DIAGNOSIS — N186 End stage renal disease: Secondary | ICD-10-CM | POA: Diagnosis not present

## 2017-04-22 DIAGNOSIS — D631 Anemia in chronic kidney disease: Secondary | ICD-10-CM | POA: Diagnosis not present

## 2017-04-22 DIAGNOSIS — N2581 Secondary hyperparathyroidism of renal origin: Secondary | ICD-10-CM | POA: Diagnosis not present

## 2017-04-23 ENCOUNTER — Encounter: Payer: Medicare Other | Admitting: Family Medicine

## 2017-04-24 DIAGNOSIS — Z992 Dependence on renal dialysis: Secondary | ICD-10-CM | POA: Diagnosis not present

## 2017-04-24 DIAGNOSIS — N2581 Secondary hyperparathyroidism of renal origin: Secondary | ICD-10-CM | POA: Diagnosis not present

## 2017-04-24 DIAGNOSIS — D631 Anemia in chronic kidney disease: Secondary | ICD-10-CM | POA: Diagnosis not present

## 2017-04-24 DIAGNOSIS — N186 End stage renal disease: Secondary | ICD-10-CM | POA: Diagnosis not present

## 2017-04-24 DIAGNOSIS — I129 Hypertensive chronic kidney disease with stage 1 through stage 4 chronic kidney disease, or unspecified chronic kidney disease: Secondary | ICD-10-CM | POA: Diagnosis not present

## 2017-04-27 DIAGNOSIS — N2581 Secondary hyperparathyroidism of renal origin: Secondary | ICD-10-CM | POA: Diagnosis not present

## 2017-04-27 DIAGNOSIS — N186 End stage renal disease: Secondary | ICD-10-CM | POA: Diagnosis not present

## 2017-04-27 DIAGNOSIS — D509 Iron deficiency anemia, unspecified: Secondary | ICD-10-CM | POA: Diagnosis not present

## 2017-04-27 DIAGNOSIS — D631 Anemia in chronic kidney disease: Secondary | ICD-10-CM | POA: Diagnosis not present

## 2017-04-29 ENCOUNTER — Encounter: Payer: Self-pay | Admitting: Gastroenterology

## 2017-04-29 DIAGNOSIS — D631 Anemia in chronic kidney disease: Secondary | ICD-10-CM | POA: Diagnosis not present

## 2017-04-29 DIAGNOSIS — D509 Iron deficiency anemia, unspecified: Secondary | ICD-10-CM | POA: Diagnosis not present

## 2017-04-29 DIAGNOSIS — N2581 Secondary hyperparathyroidism of renal origin: Secondary | ICD-10-CM | POA: Diagnosis not present

## 2017-04-29 DIAGNOSIS — N186 End stage renal disease: Secondary | ICD-10-CM | POA: Diagnosis not present

## 2017-05-01 DIAGNOSIS — N2581 Secondary hyperparathyroidism of renal origin: Secondary | ICD-10-CM | POA: Diagnosis not present

## 2017-05-01 DIAGNOSIS — D631 Anemia in chronic kidney disease: Secondary | ICD-10-CM | POA: Diagnosis not present

## 2017-05-01 DIAGNOSIS — D509 Iron deficiency anemia, unspecified: Secondary | ICD-10-CM | POA: Diagnosis not present

## 2017-05-01 DIAGNOSIS — N186 End stage renal disease: Secondary | ICD-10-CM | POA: Diagnosis not present

## 2017-05-04 DIAGNOSIS — D509 Iron deficiency anemia, unspecified: Secondary | ICD-10-CM | POA: Diagnosis not present

## 2017-05-04 DIAGNOSIS — N186 End stage renal disease: Secondary | ICD-10-CM | POA: Diagnosis not present

## 2017-05-04 DIAGNOSIS — N2581 Secondary hyperparathyroidism of renal origin: Secondary | ICD-10-CM | POA: Diagnosis not present

## 2017-05-04 DIAGNOSIS — D631 Anemia in chronic kidney disease: Secondary | ICD-10-CM | POA: Diagnosis not present

## 2017-05-05 ENCOUNTER — Ambulatory Visit: Payer: Medicare Other

## 2017-05-06 DIAGNOSIS — D509 Iron deficiency anemia, unspecified: Secondary | ICD-10-CM | POA: Diagnosis not present

## 2017-05-06 DIAGNOSIS — N186 End stage renal disease: Secondary | ICD-10-CM | POA: Diagnosis not present

## 2017-05-06 DIAGNOSIS — N2581 Secondary hyperparathyroidism of renal origin: Secondary | ICD-10-CM | POA: Diagnosis not present

## 2017-05-06 DIAGNOSIS — D631 Anemia in chronic kidney disease: Secondary | ICD-10-CM | POA: Diagnosis not present

## 2017-05-08 DIAGNOSIS — N186 End stage renal disease: Secondary | ICD-10-CM | POA: Diagnosis not present

## 2017-05-08 DIAGNOSIS — D631 Anemia in chronic kidney disease: Secondary | ICD-10-CM | POA: Diagnosis not present

## 2017-05-08 DIAGNOSIS — D509 Iron deficiency anemia, unspecified: Secondary | ICD-10-CM | POA: Diagnosis not present

## 2017-05-08 DIAGNOSIS — N2581 Secondary hyperparathyroidism of renal origin: Secondary | ICD-10-CM | POA: Diagnosis not present

## 2017-05-11 DIAGNOSIS — N186 End stage renal disease: Secondary | ICD-10-CM | POA: Diagnosis not present

## 2017-05-11 DIAGNOSIS — D509 Iron deficiency anemia, unspecified: Secondary | ICD-10-CM | POA: Diagnosis not present

## 2017-05-11 DIAGNOSIS — D631 Anemia in chronic kidney disease: Secondary | ICD-10-CM | POA: Diagnosis not present

## 2017-05-11 DIAGNOSIS — N2581 Secondary hyperparathyroidism of renal origin: Secondary | ICD-10-CM | POA: Diagnosis not present

## 2017-05-12 ENCOUNTER — Encounter: Payer: Medicare Other | Admitting: Family Medicine

## 2017-05-12 DIAGNOSIS — N186 End stage renal disease: Secondary | ICD-10-CM | POA: Diagnosis not present

## 2017-05-12 DIAGNOSIS — Z992 Dependence on renal dialysis: Secondary | ICD-10-CM | POA: Diagnosis not present

## 2017-05-12 DIAGNOSIS — I871 Compression of vein: Secondary | ICD-10-CM | POA: Diagnosis not present

## 2017-05-12 DIAGNOSIS — T82858A Stenosis of vascular prosthetic devices, implants and grafts, initial encounter: Secondary | ICD-10-CM | POA: Diagnosis not present

## 2017-05-15 DIAGNOSIS — D509 Iron deficiency anemia, unspecified: Secondary | ICD-10-CM | POA: Diagnosis not present

## 2017-05-15 DIAGNOSIS — D631 Anemia in chronic kidney disease: Secondary | ICD-10-CM | POA: Diagnosis not present

## 2017-05-15 DIAGNOSIS — N2581 Secondary hyperparathyroidism of renal origin: Secondary | ICD-10-CM | POA: Diagnosis not present

## 2017-05-15 DIAGNOSIS — N186 End stage renal disease: Secondary | ICD-10-CM | POA: Diagnosis not present

## 2017-05-18 DIAGNOSIS — I871 Compression of vein: Secondary | ICD-10-CM | POA: Diagnosis not present

## 2017-05-18 DIAGNOSIS — D631 Anemia in chronic kidney disease: Secondary | ICD-10-CM | POA: Diagnosis not present

## 2017-05-18 DIAGNOSIS — D509 Iron deficiency anemia, unspecified: Secondary | ICD-10-CM | POA: Diagnosis not present

## 2017-05-18 DIAGNOSIS — T82868A Thrombosis of vascular prosthetic devices, implants and grafts, initial encounter: Secondary | ICD-10-CM | POA: Diagnosis not present

## 2017-05-18 DIAGNOSIS — N186 End stage renal disease: Secondary | ICD-10-CM | POA: Diagnosis not present

## 2017-05-18 DIAGNOSIS — Z992 Dependence on renal dialysis: Secondary | ICD-10-CM | POA: Diagnosis not present

## 2017-05-18 DIAGNOSIS — N2581 Secondary hyperparathyroidism of renal origin: Secondary | ICD-10-CM | POA: Diagnosis not present

## 2017-05-19 ENCOUNTER — Other Ambulatory Visit: Payer: Self-pay | Admitting: Family Medicine

## 2017-05-19 ENCOUNTER — Ambulatory Visit: Payer: Medicare Other

## 2017-05-19 DIAGNOSIS — Z1231 Encounter for screening mammogram for malignant neoplasm of breast: Secondary | ICD-10-CM

## 2017-05-20 DIAGNOSIS — N2581 Secondary hyperparathyroidism of renal origin: Secondary | ICD-10-CM | POA: Diagnosis not present

## 2017-05-20 DIAGNOSIS — D631 Anemia in chronic kidney disease: Secondary | ICD-10-CM | POA: Diagnosis not present

## 2017-05-20 DIAGNOSIS — D509 Iron deficiency anemia, unspecified: Secondary | ICD-10-CM | POA: Diagnosis not present

## 2017-05-20 DIAGNOSIS — N186 End stage renal disease: Secondary | ICD-10-CM | POA: Diagnosis not present

## 2017-05-22 DIAGNOSIS — D631 Anemia in chronic kidney disease: Secondary | ICD-10-CM | POA: Diagnosis not present

## 2017-05-22 DIAGNOSIS — N2581 Secondary hyperparathyroidism of renal origin: Secondary | ICD-10-CM | POA: Diagnosis not present

## 2017-05-22 DIAGNOSIS — N186 End stage renal disease: Secondary | ICD-10-CM | POA: Diagnosis not present

## 2017-05-22 DIAGNOSIS — D509 Iron deficiency anemia, unspecified: Secondary | ICD-10-CM | POA: Diagnosis not present

## 2017-05-24 DIAGNOSIS — N186 End stage renal disease: Secondary | ICD-10-CM | POA: Diagnosis not present

## 2017-05-24 DIAGNOSIS — Z992 Dependence on renal dialysis: Secondary | ICD-10-CM | POA: Diagnosis not present

## 2017-05-24 DIAGNOSIS — I129 Hypertensive chronic kidney disease with stage 1 through stage 4 chronic kidney disease, or unspecified chronic kidney disease: Secondary | ICD-10-CM | POA: Diagnosis not present

## 2017-05-25 DIAGNOSIS — N186 End stage renal disease: Secondary | ICD-10-CM | POA: Diagnosis not present

## 2017-05-25 DIAGNOSIS — N2581 Secondary hyperparathyroidism of renal origin: Secondary | ICD-10-CM | POA: Diagnosis not present

## 2017-05-25 DIAGNOSIS — Z23 Encounter for immunization: Secondary | ICD-10-CM | POA: Diagnosis not present

## 2017-05-25 DIAGNOSIS — D509 Iron deficiency anemia, unspecified: Secondary | ICD-10-CM | POA: Diagnosis not present

## 2017-05-25 DIAGNOSIS — D631 Anemia in chronic kidney disease: Secondary | ICD-10-CM | POA: Diagnosis not present

## 2017-05-27 DIAGNOSIS — N2581 Secondary hyperparathyroidism of renal origin: Secondary | ICD-10-CM | POA: Diagnosis not present

## 2017-05-27 DIAGNOSIS — N186 End stage renal disease: Secondary | ICD-10-CM | POA: Diagnosis not present

## 2017-05-27 DIAGNOSIS — D509 Iron deficiency anemia, unspecified: Secondary | ICD-10-CM | POA: Diagnosis not present

## 2017-05-27 DIAGNOSIS — Z23 Encounter for immunization: Secondary | ICD-10-CM | POA: Diagnosis not present

## 2017-05-27 DIAGNOSIS — D631 Anemia in chronic kidney disease: Secondary | ICD-10-CM | POA: Diagnosis not present

## 2017-05-29 DIAGNOSIS — N186 End stage renal disease: Secondary | ICD-10-CM | POA: Diagnosis not present

## 2017-05-29 DIAGNOSIS — Z23 Encounter for immunization: Secondary | ICD-10-CM | POA: Diagnosis not present

## 2017-05-29 DIAGNOSIS — D631 Anemia in chronic kidney disease: Secondary | ICD-10-CM | POA: Diagnosis not present

## 2017-05-29 DIAGNOSIS — N2581 Secondary hyperparathyroidism of renal origin: Secondary | ICD-10-CM | POA: Diagnosis not present

## 2017-05-29 DIAGNOSIS — D509 Iron deficiency anemia, unspecified: Secondary | ICD-10-CM | POA: Diagnosis not present

## 2017-06-01 DIAGNOSIS — Z23 Encounter for immunization: Secondary | ICD-10-CM | POA: Diagnosis not present

## 2017-06-01 DIAGNOSIS — D631 Anemia in chronic kidney disease: Secondary | ICD-10-CM | POA: Diagnosis not present

## 2017-06-01 DIAGNOSIS — N2581 Secondary hyperparathyroidism of renal origin: Secondary | ICD-10-CM | POA: Diagnosis not present

## 2017-06-01 DIAGNOSIS — N186 End stage renal disease: Secondary | ICD-10-CM | POA: Diagnosis not present

## 2017-06-01 DIAGNOSIS — D509 Iron deficiency anemia, unspecified: Secondary | ICD-10-CM | POA: Diagnosis not present

## 2017-06-02 ENCOUNTER — Ambulatory Visit: Payer: Medicare Other

## 2017-06-03 DIAGNOSIS — D631 Anemia in chronic kidney disease: Secondary | ICD-10-CM | POA: Diagnosis not present

## 2017-06-03 DIAGNOSIS — Z23 Encounter for immunization: Secondary | ICD-10-CM | POA: Diagnosis not present

## 2017-06-03 DIAGNOSIS — N2581 Secondary hyperparathyroidism of renal origin: Secondary | ICD-10-CM | POA: Diagnosis not present

## 2017-06-03 DIAGNOSIS — D509 Iron deficiency anemia, unspecified: Secondary | ICD-10-CM | POA: Diagnosis not present

## 2017-06-03 DIAGNOSIS — N186 End stage renal disease: Secondary | ICD-10-CM | POA: Diagnosis not present

## 2017-06-05 DIAGNOSIS — Z23 Encounter for immunization: Secondary | ICD-10-CM | POA: Diagnosis not present

## 2017-06-05 DIAGNOSIS — D631 Anemia in chronic kidney disease: Secondary | ICD-10-CM | POA: Diagnosis not present

## 2017-06-05 DIAGNOSIS — N186 End stage renal disease: Secondary | ICD-10-CM | POA: Diagnosis not present

## 2017-06-05 DIAGNOSIS — D509 Iron deficiency anemia, unspecified: Secondary | ICD-10-CM | POA: Diagnosis not present

## 2017-06-05 DIAGNOSIS — N2581 Secondary hyperparathyroidism of renal origin: Secondary | ICD-10-CM | POA: Diagnosis not present

## 2017-06-08 DIAGNOSIS — Z23 Encounter for immunization: Secondary | ICD-10-CM | POA: Diagnosis not present

## 2017-06-08 DIAGNOSIS — N2581 Secondary hyperparathyroidism of renal origin: Secondary | ICD-10-CM | POA: Diagnosis not present

## 2017-06-08 DIAGNOSIS — N186 End stage renal disease: Secondary | ICD-10-CM | POA: Diagnosis not present

## 2017-06-08 DIAGNOSIS — D631 Anemia in chronic kidney disease: Secondary | ICD-10-CM | POA: Diagnosis not present

## 2017-06-08 DIAGNOSIS — D509 Iron deficiency anemia, unspecified: Secondary | ICD-10-CM | POA: Diagnosis not present

## 2017-06-09 ENCOUNTER — Encounter: Payer: Self-pay | Admitting: *Deleted

## 2017-06-09 ENCOUNTER — Ambulatory Visit (INDEPENDENT_AMBULATORY_CARE_PROVIDER_SITE_OTHER): Payer: Medicare Other | Admitting: Vascular Surgery

## 2017-06-09 ENCOUNTER — Other Ambulatory Visit: Payer: Self-pay | Admitting: *Deleted

## 2017-06-09 ENCOUNTER — Encounter: Payer: Self-pay | Admitting: Vascular Surgery

## 2017-06-09 VITALS — BP 109/79 | HR 77 | Temp 97.6°F | Resp 18 | Ht 65.0 in | Wt 149.5 lb

## 2017-06-09 DIAGNOSIS — Z992 Dependence on renal dialysis: Secondary | ICD-10-CM | POA: Diagnosis not present

## 2017-06-09 DIAGNOSIS — N186 End stage renal disease: Secondary | ICD-10-CM | POA: Diagnosis not present

## 2017-06-09 NOTE — Progress Notes (Signed)
[facility] [name]  Patient name: Melody [name] MRN: [id number] DOB: 17-Apr-1976 Sex: female  REASON FOR VISIT: Evaluation for new hemodialysis access  HPI: Melody [name] is a 41 y.o. female here today for evaluation of hemodialysis access.  She had a upper arm AV graft placed by Dr. Oneida Alar on 12/30/2016.  Has had multiple thromboses of this and has now had a replacement of a catheter with reconsideration of new access.  Dr. Augustin Coupe [facility] felt this may be related to arterial inflow.  She did have a prior right radiocephalic fistula which did not mature.  She reports that she is having good dialysis currently from her catheter.  Is being evaluated for potential transplant  Past Medical History:  Diagnosis Date  . Alcohol abuse   . Chronic kidney disease    staqge 5  . Drug abuse ([place])   . Ectopic pregnancy   . Headache    Migraines  . Hypertension Dx May 2016  . Tobacco abuse     Family History  Problem Relation Age of Onset  . CAD Mother   . Hypertension Mother   . Heart disease Mother   . Kidney failure Mother   . CAD Brother   . Hypertension Other   . Kidney failure Other   . Hypertension Maternal Grandmother   . Kidney failure Maternal Grandmother   . Congestive Heart Failure Maternal Grandmother     SOCIAL HISTORY: Social History  Substance Use Topics  . Smoking status: Former Smoker    Packs/day: 0.00    Years: 0.00    Types: Cigars, Cigarettes    Start date: 09/11/2016  . Smokeless tobacco: Former Systems developer  . Alcohol use No    Allergies  Allergen Reactions  . Acetaminophen     Tylenol #3 Causes nightmares  . Codeine Other (See Comments)  . Tramadol Hives    Current Outpatient Prescriptions  Medication Sig Dispense Refill  . amLODipine (NORVASC) 5 MG tablet Take 1 tablet by mouth at bedtime.   0  . calcium acetate (PHOSLO) 667 MG capsule Take 2 capsules (1,334 mg total) by mouth 3 (three)  times daily with meals. 180 capsule 0  . labetalol (NORMODYNE) 300 MG tablet Take 1 tablet by mouth 2 (two) times daily.   3  . lisinopril (PRINIVIL,ZESTRIL) 30 MG tablet Take 1 tablet by mouth at bedtime.   0  . methocarbamol (ROBAXIN) 500 MG tablet Take 1 tablet (500 mg total) by mouth every 8 (eight) hours as needed for muscle spasms. (Patient not taking: Reported on 06/09/2017) 12 tablet 0  . oxyCODONE (OXY IR/ROXICODONE) 5 MG immediate release tablet Take 1 tablet (5 mg total) by mouth every 4 (four) hours as needed for severe pain. (Patient not taking: Reported on 06/09/2017) 20 tablet 0   No current facility-administered medications for this visit.     REVIEW OF SYSTEMS:  [X]  denotes positive finding, [ ]  denotes negative finding Cardiac  Comments:  Chest pain or chest pressure:    Shortness of breath upon exertion:    Short of breath when lying flat:    Irregular heart rhythm:        Vascular    Pain in calf, thigh, or hip brought on by ambulation:    Pain in feet at night that wakes you up from your sleep:     Blood clot in your veins:    Leg swelling:  PHYSICAL EXAM: Vitals:   06/09/17 0851  BP: 109/79  Pulse: 77  Resp: 18  Temp: 97.6 F (36.4 C)  TempSrc: Oral  SpO2: 100%  Weight: 149 lb 8 oz (67.8 kg)  Height: 5\' 5"  (1.651 m)    GENERAL: The patient is a well-nourished female, in no acute distress. The vital signs are documented above. CARDIOVASCULAR: 2+ radial pulses bilaterally.  Well-healed old scar on the right wrist.  Has occluded left upper arm straight graft. PULMONARY: There is good air exchange  MUSCULOSKELETAL: There are no major deformities or cyanosis. NEUROLOGIC: No focal weakness or paresthesias are detected. SKIN: There are no ulcers or rashes noted. PSYCHIATRIC: The patient has a normal affect.  DATA:  None  MEDICAL ISSUES: Discussed options with patient.  Would recommend attempted new left upper arm graft placement.  Would  recommend exploration of her axillary vein in his adequate size I would replace with a new graft.  If her axillary vein is inadequate on the left would place a right arm graft.  We will plan for this on October 22.  She normally dialyzes on Monday Wednesday and Friday.  We will plan for surgery on Monday, October 22 and will arrange dialysis on Tuesday Wednesday and Friday of that week with [facility]    Rosetta Posner, MD [facility] Tel [phone] Pager [phone]

## 2017-06-10 DIAGNOSIS — N2581 Secondary hyperparathyroidism of renal origin: Secondary | ICD-10-CM | POA: Diagnosis not present

## 2017-06-10 DIAGNOSIS — Z23 Encounter for immunization: Secondary | ICD-10-CM | POA: Diagnosis not present

## 2017-06-10 DIAGNOSIS — D631 Anemia in chronic kidney disease: Secondary | ICD-10-CM | POA: Diagnosis not present

## 2017-06-10 DIAGNOSIS — N186 End stage renal disease: Secondary | ICD-10-CM | POA: Diagnosis not present

## 2017-06-10 DIAGNOSIS — D509 Iron deficiency anemia, unspecified: Secondary | ICD-10-CM | POA: Diagnosis not present

## 2017-06-12 ENCOUNTER — Encounter (HOSPITAL_COMMUNITY): Payer: Self-pay | Admitting: *Deleted

## 2017-06-12 DIAGNOSIS — N186 End stage renal disease: Secondary | ICD-10-CM | POA: Diagnosis not present

## 2017-06-12 DIAGNOSIS — N2581 Secondary hyperparathyroidism of renal origin: Secondary | ICD-10-CM | POA: Diagnosis not present

## 2017-06-12 DIAGNOSIS — D631 Anemia in chronic kidney disease: Secondary | ICD-10-CM | POA: Diagnosis not present

## 2017-06-12 DIAGNOSIS — D509 Iron deficiency anemia, unspecified: Secondary | ICD-10-CM | POA: Diagnosis not present

## 2017-06-12 DIAGNOSIS — Z23 Encounter for immunization: Secondary | ICD-10-CM | POA: Diagnosis not present

## 2017-06-12 NOTE — Progress Notes (Signed)
Pt denies SOB, chest pain, and being under the care of a cardiologist. Pt denies having a  cardiac cath. Pt made aware to stop taking vitamins, fish oil and herbal medications. Do not take any NSAIDs ie: Ibuprofen, Advil, Naproxen, BC and Goody Powder. Pt verbalized understanding of all pre-op instructions.

## 2017-06-15 ENCOUNTER — Ambulatory Visit (HOSPITAL_COMMUNITY)
Admission: RE | Admit: 2017-06-15 | Discharge: 2017-06-15 | Disposition: A | Discharge status: Home / Self Care | Payer: Medicare Other | Source: Ambulatory Visit | Attending: Vascular Surgery | Admitting: Vascular Surgery

## 2017-06-15 ENCOUNTER — Encounter (HOSPITAL_COMMUNITY)
Admission: RE | Disposition: A | Discharge status: Home / Self Care | Payer: Self-pay | Source: Ambulatory Visit | Attending: Vascular Surgery

## 2017-06-15 ENCOUNTER — Ambulatory Visit (HOSPITAL_COMMUNITY): Payer: Medicare Other | Admitting: Certified Registered Nurse Anesthetist

## 2017-06-15 ENCOUNTER — Encounter (HOSPITAL_COMMUNITY): Payer: Self-pay | Admitting: *Deleted

## 2017-06-15 DIAGNOSIS — Z87891 Personal history of nicotine dependence: Secondary | ICD-10-CM | POA: Diagnosis not present

## 2017-06-15 DIAGNOSIS — Z992 Dependence on renal dialysis: Secondary | ICD-10-CM | POA: Diagnosis not present

## 2017-06-15 DIAGNOSIS — Z79899 Other long term (current) drug therapy: Secondary | ICD-10-CM | POA: Diagnosis not present

## 2017-06-15 DIAGNOSIS — N186 End stage renal disease: Secondary | ICD-10-CM | POA: Diagnosis not present

## 2017-06-15 DIAGNOSIS — I120 Hypertensive chronic kidney disease with stage 5 chronic kidney disease or end stage renal disease: Secondary | ICD-10-CM | POA: Insufficient documentation

## 2017-06-15 DIAGNOSIS — D631 Anemia in chronic kidney disease: Secondary | ICD-10-CM | POA: Diagnosis not present

## 2017-06-15 DIAGNOSIS — N185 Chronic kidney disease, stage 5: Secondary | ICD-10-CM | POA: Diagnosis not present

## 2017-06-15 DIAGNOSIS — D696 Thrombocytopenia, unspecified: Secondary | ICD-10-CM | POA: Diagnosis not present

## 2017-06-15 HISTORY — PX: AV FISTULA PLACEMENT: SHX1204

## 2017-06-15 LAB — POCT I-STAT 4, (NA,K, GLUC, HGB,HCT)
Glucose, Bld: 82 mg/dL (ref 65–99)
HCT: 29 % — ABNORMAL LOW (ref 36.0–46.0)
Hemoglobin: 9.9 g/dL — ABNORMAL LOW (ref 12.0–15.0)
Potassium: 4.1 mmol/L (ref 3.5–5.1)
Sodium: 140 mmol/L (ref 135–145)

## 2017-06-15 LAB — HCG, SERUM, QUALITATIVE: PREG SERUM: NEGATIVE

## 2017-06-15 SURGERY — INSERTION OF ARTERIOVENOUS (AV) GORE-TEX GRAFT ARM
Anesthesia: Monitor Anesthesia Care | Site: Arm Upper | Laterality: Left

## 2017-06-15 MED ORDER — DEXTROSE 5 % IV SOLN
1.5000 g | INTRAVENOUS | Status: AC
  Administered 2017-06-15: 1.5 g via INTRAVENOUS
  Filled 2017-06-15: qty 1.5

## 2017-06-15 MED ORDER — PROPOFOL 10 MG/ML IV BOLUS
INTRAVENOUS | Status: AC
  Filled 2017-06-15: qty 20

## 2017-06-15 MED ORDER — ONDANSETRON HCL 4 MG/2ML IJ SOLN: 4.0000 mg | Freq: Once | INTRAMUSCULAR | Status: DC | PRN

## 2017-06-15 MED ORDER — LIDOCAINE 2% (20 MG/ML) 5 ML SYRINGE
INTRAMUSCULAR | Status: AC
  Filled 2017-06-15: qty 5

## 2017-06-15 MED ORDER — MIDAZOLAM HCL 5 MG/5ML IJ SOLN
INTRAMUSCULAR | Status: DC | PRN
  Administered 2017-06-15: 2 mg via INTRAVENOUS

## 2017-06-15 MED ORDER — LIDOCAINE-EPINEPHRINE 0.5 %-1:200000 IJ SOLN
INTRAMUSCULAR | Status: DC | PRN
  Administered 2017-06-15: 50 mL

## 2017-06-15 MED ORDER — MIDAZOLAM HCL 2 MG/2ML IJ SOLN
INTRAMUSCULAR | Status: AC
  Filled 2017-06-15: qty 2

## 2017-06-15 MED ORDER — ONDANSETRON HCL 4 MG/2ML IJ SOLN
INTRAMUSCULAR | Status: AC
  Filled 2017-06-15: qty 2

## 2017-06-15 MED ORDER — LIDOCAINE-EPINEPHRINE 0.5 %-1:200000 IJ SOLN
INTRAMUSCULAR | Status: AC
  Filled 2017-06-15: qty 1

## 2017-06-15 MED ORDER — PROPOFOL 10 MG/ML IV BOLUS
INTRAVENOUS | Status: DC | PRN
  Administered 2017-06-15: 20 mg via INTRAVENOUS
  Administered 2017-06-15 (×2): 30 mg via INTRAVENOUS
  Administered 2017-06-15: 20 mg via INTRAVENOUS
  Administered 2017-06-15 (×3): 30 mg via INTRAVENOUS
  Administered 2017-06-15: 10 mg via INTRAVENOUS
  Administered 2017-06-15 (×2): 20 mg via INTRAVENOUS

## 2017-06-15 MED ORDER — SODIUM CHLORIDE 0.9 % IV SOLN
INTRAVENOUS | Status: DC
  Administered 2017-06-15: 08:00:00 via INTRAVENOUS

## 2017-06-15 MED ORDER — FENTANYL CITRATE (PF) 100 MCG/2ML IJ SOLN
25.0000 ug | INTRAMUSCULAR | Status: DC | PRN
  Administered 2017-06-15 (×2): 50 ug via INTRAVENOUS

## 2017-06-15 MED ORDER — FENTANYL CITRATE (PF) 100 MCG/2ML IJ SOLN
INTRAMUSCULAR | Status: AC
  Administered 2017-06-15: 50 ug via INTRAVENOUS
  Filled 2017-06-15: qty 2

## 2017-06-15 MED ORDER — PROPOFOL 500 MG/50ML IV EMUL
INTRAVENOUS | Status: DC | PRN
  Administered 2017-06-15: 75 ug/kg/min via INTRAVENOUS

## 2017-06-15 MED ORDER — LACTATED RINGERS IV SOLN: INTRAVENOUS | Status: DC

## 2017-06-15 MED ORDER — DEXAMETHASONE SODIUM PHOSPHATE 10 MG/ML IJ SOLN
INTRAMUSCULAR | Status: AC
  Filled 2017-06-15: qty 1

## 2017-06-15 MED ORDER — SODIUM CHLORIDE 0.9 % IV SOLN
INTRAVENOUS | Status: DC | PRN
  Administered 2017-06-15: 11:00:00

## 2017-06-15 MED ORDER — OXYCODONE HCL 5 MG PO TABS: 5.0000 mg | ORAL_TABLET | Freq: Four times a day (QID) | ORAL | 0 refills | Status: DC | PRN

## 2017-06-15 MED ORDER — FENTANYL CITRATE (PF) 250 MCG/5ML IJ SOLN
INTRAMUSCULAR | Status: AC
  Filled 2017-06-15: qty 5

## 2017-06-15 MED ORDER — FENTANYL CITRATE (PF) 100 MCG/2ML IJ SOLN
INTRAMUSCULAR | Status: DC | PRN
  Administered 2017-06-15 (×3): 25 ug via INTRAVENOUS
  Administered 2017-06-15 (×2): 50 ug via INTRAVENOUS
  Administered 2017-06-15: 25 ug via INTRAVENOUS
  Administered 2017-06-15: 50 ug via INTRAVENOUS

## 2017-06-15 MED ORDER — 0.9 % SODIUM CHLORIDE (POUR BTL) OPTIME
TOPICAL | Status: DC | PRN
  Administered 2017-06-15: 1000 mL

## 2017-06-15 MED ORDER — LIDOCAINE 2% (20 MG/ML) 5 ML SYRINGE
INTRAMUSCULAR | Status: DC | PRN
  Administered 2017-06-15: 60 mg via INTRAVENOUS

## 2017-06-15 MED ORDER — ONDANSETRON HCL 4 MG/2ML IJ SOLN
INTRAMUSCULAR | Status: DC | PRN
  Administered 2017-06-15: 4 mg via INTRAVENOUS

## 2017-06-15 SURGICAL SUPPLY — 30 items
ADH SKN CLS APL DERMABOND .7 (GAUZE/BANDAGES/DRESSINGS) ×1
ARMBAND PINK RESTRICT EXTREMIT (MISCELLANEOUS) ×6 IMPLANT
CANISTER SUCT 3000ML PPV (MISCELLANEOUS) ×3 IMPLANT
CANNULA VESSEL 3MM 2 BLNT TIP (CANNULA) ×3 IMPLANT
CATH EMB 4FR 80CM (CATHETERS) ×2 IMPLANT
CLIP LIGATING EXTRA MED SLVR (CLIP) ×3 IMPLANT
CLIP LIGATING EXTRA SM BLUE (MISCELLANEOUS) ×3 IMPLANT
DECANTER SPIKE VIAL GLASS SM (MISCELLANEOUS) ×3 IMPLANT
DERMABOND ADVANCED (GAUZE/BANDAGES/DRESSINGS) ×2
DERMABOND ADVANCED .7 DNX12 (GAUZE/BANDAGES/DRESSINGS) ×1 IMPLANT
ELECT REM PT RETURN 9FT ADLT (ELECTROSURGICAL) ×3
ELECTRODE REM PT RTRN 9FT ADLT (ELECTROSURGICAL) ×1 IMPLANT
GAUZE SPONGE 4X4 12PLY STRL (GAUZE/BANDAGES/DRESSINGS) ×3 IMPLANT
GLOVE SS BIOGEL STRL SZ 7.5 (GLOVE) ×1 IMPLANT
GLOVE SUPERSENSE BIOGEL SZ 7.5 (GLOVE) ×2
GOWN STRL REUS W/ TWL LRG LVL3 (GOWN DISPOSABLE) ×3 IMPLANT
GOWN STRL REUS W/TWL LRG LVL3 (GOWN DISPOSABLE) ×9
GRAFT GORETEX 6X40 (Vascular Products) ×2 IMPLANT
KIT BASIN OR (CUSTOM PROCEDURE TRAY) ×3 IMPLANT
KIT ROOM TURNOVER OR (KITS) ×3 IMPLANT
NS IRRIG 1000ML POUR BTL (IV SOLUTION) ×3 IMPLANT
PACK CV ACCESS (CUSTOM PROCEDURE TRAY) ×3 IMPLANT
PAD ARMBOARD 7.5X6 YLW CONV (MISCELLANEOUS) ×6 IMPLANT
SUT PROLENE 6 0 CC (SUTURE) ×10 IMPLANT
SUT SILK 2 0 FS (SUTURE) IMPLANT
SUT VIC AB 3-0 SH 27 (SUTURE) ×6
SUT VIC AB 3-0 SH 27X BRD (SUTURE) ×2 IMPLANT
SYR TOOMEY 50ML (SYRINGE) IMPLANT
UNDERPAD 30X30 (UNDERPADS AND DIAPERS) ×3 IMPLANT
WATER STERILE IRR 1000ML POUR (IV SOLUTION) ×3 IMPLANT

## 2017-06-15 NOTE — Discharge Instructions (Signed)
° °  [facility] Ve[facility] Specialists of [facility]  Discharge Instructions  AV Fistula or Graft Surgery for Dialysis Access  Please refer to the following instructions for your post-procedure care. Your surgeon or physician assistant will discuss any changes with you.  Activity  You may drive the day following your surgery, if you are comfortable and no longer taking prescription pain medication. Resume full activity as the soreness in your incision resolves.  Bathing/Showering  You may shower after you go home. Keep your incision dry for 48 hours. Do not soak in a bathtub, hot tub, or swim until the incision heals completely. You may not shower if you have a hemodialysis catheter.  Incision Care  Clean your incision with mild soap and water after 48 hours. Pat the area dry with a clean towel. You do not need a bandage unless otherwise instructed. Do not apply any ointments or creams to your incision. You may have skin glue on your incision. Do not peel it off. It will come off on its own in about one week. Your arm may swell a bit after surgery. To reduce swelling use pillows to elevate your arm so it is above your heart. Your doctor will tell you if you need to lightly wrap your arm with an ACE bandage.  Diet  Resume your normal diet. There are not special food restrictions following this procedure. In order to heal from your surgery, it is CRITICAL to get adequate nutrition. Your body requires vitamins, minerals, and protein. Vegetables are the best source of vitamins and minerals. Vegetables also provide the perfect balance of protein. Processed food has little nutritional value, so try to avoid this.  Medications  Resume taking all of your medications. If your incision is causing pain, you may take over-the counter pain relievers such as acetaminophen (Tylenol). If you were prescribed a stronger pain medication, please be aware these medications can cause nausea and constipation. Prevent  nausea by taking the medication with a snack or meal. Avoid constipation by drinking plenty of fluids and eating foods with high amount of fiber, such as fruits, vegetables, and grains. Do not take Tylenol if you are taking prescription pain medications.  Follow up Your surgeon may want to see you in the office following your access surgery. If so, this will be arranged at the time of your surgery.  Please call us immediately for any of the following conditions:  Increased pain, redness, drainage (pus) from your incision site Fever of 101 degrees or higher Severe or worsening pain at your incision site Hand pain or numbness.  Reduce your risk of vascular disease:  Stop smoking. If you would like help, call [facility] at 1-800-QUIT-NOW (581)556-5455) or [place] at [place] your cholesterol Maintain a desired weight Control your diabetes Keep your blood pressure down  Dialysis  It will take several weeks to several months for your new dialysis access to be ready for use. Your surgeon will determine when it is OK to use it. Your nephrologist will continue to direct your dialysis. You can continue to use your Permcath until your new access is ready for use.   06/15/2017 Melody [name] [id number] [birth year]  Surgeon(s): Early, Arvilla Meres, MD  Procedure(s): INSERTION OF ARTERIOVENOUS (AV) GORE-TEX GRAFT ARM LEFT  x Do not stick graft for 4 weeks    If you have any questions, please call the office at [phone].

## 2017-06-15 NOTE — Op Note (Signed)
    OPERATIVE REPORT  DATE OF SURGERY: 06/15/2017  PATIENT: Melody [name], 41 y.o. female MRN: [id number]  DOB: 12-25-1975  PRE-OPERATIVE DIAGNOSIS: End-stage renal disease  POST-OPERATIVE DIAGNOSIS:  Same  PROCEDURE: New left upper arm AV Gore-Tex graft  SURGEON:  Curt Jews, M.D.  PHYSICIAN ASSISTANT: Samantha Rhyne  PA-C  ANESTHESIA: Local with sedation  EBL: Minimal ml  Total I/O In: 475 [I.V.:400; Other:75] Out: 25 [Blood:25]  BLOOD ADMINISTERED: None  DRAINS: None  SPECIMEN: None  COUNTS CORRECT:  YES  PLAN OF CARE: PACU  PATIENT DISPOSITION:  PACU - hemodynamically stable  PROCEDURE DETAILS: Patient was taken to the operative place who presents with area of the left arm was prepped in usual sterile fashion.  The prior axillary incision was anesthetized with 1/2% lidocaine with epinephrine.  Incision was made high in the axilla and carried down to isolate the axillary vein.  The patient had a covered stent in this portion of her axillary vein.  The vein was identified well up into the axilla above the stent and appear to be patent.  The the stent was transected and was occluded.  Heavy scissors were used to cut longitudinally through the stent onto the vein.  The vein was patent above this.  A Fogarty catheter passed all the way into the chest and there was no thrombus there.  The vein was approximately 4-1/2 mm in diameter.  Next a separate incision was made over the proximal and distal to the graft.  A new tunnel was created from the brachial artery at the antecubital space lateral to the prior existing graft and to the axillary vein.  The graft was spatulated and sewn into into the vein with a running 6-0 Prolene suture.  This was flushed with heparinized saline and reoccluded.  Next the brachial artery was occluded proximal and distal to the old arterial anastomosis and the old arterial anastomosis excised.  The new graft was cut to the appropriate dimension and  was sewn end-to-side to the artery with a running 6-0 Prolene suture.  Clamps were removed and thrill was noted.  Wounds irrigated with saline.  Hemostasis Englert cautery.  Several additional sutures required at the venous anastomosis for hemostasis.  The wounds were closed with 3-0 Vicryl in the subcutaneous and subcuticular tissue.  Sterile dressing was applied and the patient was transferred to the recovery room in stable condition   Rosetta Posner, M.D., [facility] 06/15/2017 2:32 PM

## 2017-06-15 NOTE — Transfer of Care (Signed)
Immediate Anesthesia Transfer of Care Note  Patient: Melody [name]  Procedure(s) Performed: INSERTION OF ARTERIOVENOUS (AV) GORE-TEX GRAFT 6MM X 40CM LEFT ARM (Left Arm Upper)  Patient Location: PACU  Anesthesia Type:MAC  Level of Consciousness: awake, alert  and oriented  Airway & Oxygen Therapy: Patient Spontanous Breathing and Patient connected to face mask oxygen  Post-op Assessment: Report given to RN and Post -op Vital signs reviewed and stable  Post vital signs: Reviewed and stable  Last Vitals:  Vitals:   06/15/17 0803 06/15/17 0935  BP:  (!) 134/91  Pulse: 76   Resp: 18   Temp: 37.1 C   SpO2: 100%     Last Pain:  Vitals:   06/15/17 0803  TempSrc: Oral      Patients Stated Pain Goal: 2 (44/96/75 9163)  Complications: No apparent anesthesia complications

## 2017-06-15 NOTE — Anesthesia Preprocedure Evaluation (Signed)
Anesthesia Evaluation  Patient identified by MRN, date of birth, ID band Patient awake    Reviewed: Allergy & Precautions, NPO status , Patient's Chart, lab work & pertinent test results  Airway Mallampati: II  TM Distance: >3 FB Neck ROM: Full    Dental  (+) Teeth Intact, Dental Advisory Given   Pulmonary former smoker,    breath sounds clear to auscultation       Cardiovascular hypertension,  Rhythm:Regular Rate:Normal     Neuro/Psych    GI/Hepatic   Endo/Other    Renal/GU      Musculoskeletal   Abdominal   Peds  Hematology   Anesthesia Other Findings   Reproductive/Obstetrics                             Anesthesia Physical Anesthesia Plan  ASA: III  Anesthesia Plan: MAC   Post-op Pain Management:    Induction: Intravenous  PONV Risk Score and Plan: 1 and Ondansetron and Dexamethasone  Airway Management Planned: Natural Airway and Simple Face Mask  Additional Equipment:   Intra-op Plan:   Post-operative Plan:   Informed Consent: I have reviewed the patients History and Physical, chart, labs and discussed the procedure including the risks, benefits and alternatives for the proposed anesthesia with the patient or authorized representative who has indicated his/her understanding and acceptance.   Dental advisory given  Plan Discussed with: CRNA and Anesthesiologist  Anesthesia Plan Comments:         Anesthesia Quick Evaluation

## 2017-06-15 NOTE — Anesthesia Postprocedure Evaluation (Signed)
Anesthesia Post Note  Patient: Melody [name]  Procedure(s) Performed: INSERTION OF ARTERIOVENOUS (AV) GORE-TEX GRAFT 6MM X 40CM LEFT ARM (Left Arm Upper)     Patient location during evaluation: PACU Anesthesia Type: MAC Level of consciousness: awake, awake and alert and oriented Pain management: pain level controlled Vital Signs Assessment: post-procedure vital signs reviewed and stable Respiratory status: spontaneous breathing, nonlabored ventilation and respiratory function stable Cardiovascular status: blood pressure returned to baseline Anesthetic complications: no    Last Vitals:  Vitals:   06/15/17 1325 06/15/17 1335  BP: 120/86 120/90  Pulse: 78 72  Resp: 12 12  Temp:  36.4 C  SpO2: 94% 95%    Last Pain:  Vitals:   06/15/17 1340  TempSrc:   PainSc: 0-No pain                 Lucienne Sawyers COKER

## 2017-06-15 NOTE — Interval H&P Note (Signed)
History and Physical Interval Note:  06/15/2017 10:14 AM  Melody [name]  has presented today for surgery, with the diagnosis of END STAGE RENAL DISEASE  N18.6  The various methods of treatment have been discussed with the patient and family. After consideration of risks, benefits and other options for treatment, the patient has consented to  Procedure(s): INSERTION OF ARTERIOVENOUS (AV) GORE-TEX GRAFT ARM LEFT (Left) as a surgical intervention .  The patient's history has been reviewed, patient examined, no change in status, stable for surgery.  I have reviewed the patient's chart and labs.  Questions were answered to the patient's satisfaction.     Curt Jews

## 2017-06-15 NOTE — H&P (View-Only) (Signed)
Vascular and Vein Specialist of [place]  Patient name: Melody [name] MRN: [id number] DOB: 1976-04-30 Sex: female  REASON FOR VISIT: Evaluation for new hemodialysis access  HPI: Melody [name] is a 41 y.o. female here today for evaluation of hemodialysis access.  She had a upper arm AV graft placed by Dr. Oneida Alar on 12/30/2016.  Has had multiple thromboses of this and has now had a replacement of a catheter with reconsideration of new access.  Dr. Augustin Coupe [facility] vascular felt this may be related to arterial inflow.  She did have a prior right radiocephalic fistula which did not mature.  She reports that she is having good dialysis currently from her catheter.  Is being evaluated for potential transplant  Past Medical History:  Diagnosis Date  . Alcohol abuse   . Chronic kidney disease    staqge 5  . Drug abuse ([place])   . Ectopic pregnancy   . Headache    Migraines  . Hypertension Dx May 2016  . Tobacco abuse     Family History  Problem Relation Age of Onset  . CAD Mother   . Hypertension Mother   . Heart disease Mother   . Kidney failure Mother   . CAD Brother   . Hypertension Other   . Kidney failure Other   . Hypertension Maternal Grandmother   . Kidney failure Maternal Grandmother   . Congestive Heart Failure Maternal Grandmother     SOCIAL HISTORY: Social History  Substance Use Topics  . Smoking status: Former Smoker    Packs/day: 0.00    Years: 0.00    Types: Cigars, Cigarettes    Start date: 09/11/2016  . Smokeless tobacco: Former Systems developer  . Alcohol use No    Allergies  Allergen Reactions  . Acetaminophen     Tylenol #3 Causes nightmares  . Codeine Other (See Comments)  . Tramadol Hives    Current Outpatient Prescriptions  Medication Sig Dispense Refill  . amLODipine (NORVASC) 5 MG tablet Take 1 tablet by mouth at bedtime.   0  . calcium acetate (PHOSLO) 667 MG capsule Take 2 capsules (1,334 mg total) by mouth 3 (three)  times daily with meals. 180 capsule 0  . labetalol (NORMODYNE) 300 MG tablet Take 1 tablet by mouth 2 (two) times daily.   3  . lisinopril (PRINIVIL,ZESTRIL) 30 MG tablet Take 1 tablet by mouth at bedtime.   0  . methocarbamol (ROBAXIN) 500 MG tablet Take 1 tablet (500 mg total) by mouth every 8 (eight) hours as needed for muscle spasms. (Patient not taking: Reported on 06/09/2017) 12 tablet 0  . oxyCODONE (OXY IR/ROXICODONE) 5 MG immediate release tablet Take 1 tablet (5 mg total) by mouth every 4 (four) hours as needed for severe pain. (Patient not taking: Reported on 06/09/2017) 20 tablet 0   No current facility-administered medications for this visit.     REVIEW OF SYSTEMS:  [X]  denotes positive finding, [ ]  denotes negative finding Cardiac  Comments:  Chest pain or chest pressure:    Shortness of breath upon exertion:    Short of breath when lying flat:    Irregular heart rhythm:        Vascular    Pain in calf, thigh, or hip brought on by ambulation:    Pain in feet at night that wakes you up from your sleep:     Blood clot in your veins:    Leg swelling:  PHYSICAL EXAM: Vitals:   06/09/17 0851  BP: 109/79  Pulse: 77  Resp: 18  Temp: 97.6 F (36.4 C)  TempSrc: Oral  SpO2: 100%  Weight: 149 lb 8 oz (67.8 kg)  Height: 5\' 5"  (1.651 m)    GENERAL: The patient is a well-nourished female, in no acute distress. The vital signs are documented above. CARDIOVASCULAR: 2+ radial pulses bilaterally.  Well-healed old scar on the right wrist.  Has occluded left upper arm straight graft. PULMONARY: There is good air exchange  MUSCULOSKELETAL: There are no major deformities or cyanosis. NEUROLOGIC: No focal weakness or paresthesias are detected. SKIN: There are no ulcers or rashes noted. PSYCHIATRIC: The patient has a normal affect.  DATA:  None  MEDICAL ISSUES: Discussed options with patient.  Would recommend attempted new left upper arm graft placement.  Would  recommend exploration of her axillary vein in his adequate size I would replace with a new graft.  If her axillary vein is inadequate on the left would place a right arm graft.  We will plan for this on October 22.  She normally dialyzes on Monday Wednesday and Friday.  We will plan for surgery on Monday, October 22 and will arrange dialysis on Tuesday Wednesday and Friday of that week with [facility]    Rosetta Posner, MD [facility] V[facility] and Vein Specialists of [facility] Tel [phone] Pager [phone]

## 2017-06-16 ENCOUNTER — Encounter (HOSPITAL_COMMUNITY): Payer: Self-pay | Admitting: Vascular Surgery

## 2017-06-16 DIAGNOSIS — Z23 Encounter for immunization: Secondary | ICD-10-CM | POA: Diagnosis not present

## 2017-06-16 DIAGNOSIS — D631 Anemia in chronic kidney disease: Secondary | ICD-10-CM | POA: Diagnosis not present

## 2017-06-16 DIAGNOSIS — N2581 Secondary hyperparathyroidism of renal origin: Secondary | ICD-10-CM | POA: Diagnosis not present

## 2017-06-16 DIAGNOSIS — N186 End stage renal disease: Secondary | ICD-10-CM | POA: Diagnosis not present

## 2017-06-16 DIAGNOSIS — D509 Iron deficiency anemia, unspecified: Secondary | ICD-10-CM | POA: Diagnosis not present

## 2017-06-17 DIAGNOSIS — Z23 Encounter for immunization: Secondary | ICD-10-CM | POA: Diagnosis not present

## 2017-06-17 DIAGNOSIS — N186 End stage renal disease: Secondary | ICD-10-CM | POA: Diagnosis not present

## 2017-06-17 DIAGNOSIS — N2581 Secondary hyperparathyroidism of renal origin: Secondary | ICD-10-CM | POA: Diagnosis not present

## 2017-06-17 DIAGNOSIS — D509 Iron deficiency anemia, unspecified: Secondary | ICD-10-CM | POA: Diagnosis not present

## 2017-06-17 DIAGNOSIS — D631 Anemia in chronic kidney disease: Secondary | ICD-10-CM | POA: Diagnosis not present

## 2017-06-19 DIAGNOSIS — D631 Anemia in chronic kidney disease: Secondary | ICD-10-CM | POA: Diagnosis not present

## 2017-06-19 DIAGNOSIS — N186 End stage renal disease: Secondary | ICD-10-CM | POA: Diagnosis not present

## 2017-06-19 DIAGNOSIS — N2581 Secondary hyperparathyroidism of renal origin: Secondary | ICD-10-CM | POA: Diagnosis not present

## 2017-06-19 DIAGNOSIS — Z23 Encounter for immunization: Secondary | ICD-10-CM | POA: Diagnosis not present

## 2017-06-19 DIAGNOSIS — D509 Iron deficiency anemia, unspecified: Secondary | ICD-10-CM | POA: Diagnosis not present

## 2017-06-22 DIAGNOSIS — D509 Iron deficiency anemia, unspecified: Secondary | ICD-10-CM | POA: Diagnosis not present

## 2017-06-22 DIAGNOSIS — N186 End stage renal disease: Secondary | ICD-10-CM | POA: Diagnosis not present

## 2017-06-22 DIAGNOSIS — D631 Anemia in chronic kidney disease: Secondary | ICD-10-CM | POA: Diagnosis not present

## 2017-06-22 DIAGNOSIS — N2581 Secondary hyperparathyroidism of renal origin: Secondary | ICD-10-CM | POA: Diagnosis not present

## 2017-06-22 DIAGNOSIS — Z23 Encounter for immunization: Secondary | ICD-10-CM | POA: Diagnosis not present

## 2017-06-23 ENCOUNTER — Ambulatory Visit: Payer: Medicare Other | Admitting: Gastroenterology

## 2017-06-24 DIAGNOSIS — D631 Anemia in chronic kidney disease: Secondary | ICD-10-CM | POA: Diagnosis not present

## 2017-06-24 DIAGNOSIS — Z992 Dependence on renal dialysis: Secondary | ICD-10-CM | POA: Diagnosis not present

## 2017-06-24 DIAGNOSIS — D509 Iron deficiency anemia, unspecified: Secondary | ICD-10-CM | POA: Diagnosis not present

## 2017-06-24 DIAGNOSIS — I129 Hypertensive chronic kidney disease with stage 1 through stage 4 chronic kidney disease, or unspecified chronic kidney disease: Secondary | ICD-10-CM | POA: Diagnosis not present

## 2017-06-24 DIAGNOSIS — Z23 Encounter for immunization: Secondary | ICD-10-CM | POA: Diagnosis not present

## 2017-06-24 DIAGNOSIS — N186 End stage renal disease: Secondary | ICD-10-CM | POA: Diagnosis not present

## 2017-06-24 DIAGNOSIS — N2581 Secondary hyperparathyroidism of renal origin: Secondary | ICD-10-CM | POA: Diagnosis not present

## 2017-06-26 DIAGNOSIS — N186 End stage renal disease: Secondary | ICD-10-CM | POA: Diagnosis not present

## 2017-06-26 DIAGNOSIS — D631 Anemia in chronic kidney disease: Secondary | ICD-10-CM | POA: Diagnosis not present

## 2017-06-26 DIAGNOSIS — D509 Iron deficiency anemia, unspecified: Secondary | ICD-10-CM | POA: Diagnosis not present

## 2017-06-26 DIAGNOSIS — N2581 Secondary hyperparathyroidism of renal origin: Secondary | ICD-10-CM | POA: Diagnosis not present

## 2017-06-29 DIAGNOSIS — N2581 Secondary hyperparathyroidism of renal origin: Secondary | ICD-10-CM | POA: Diagnosis not present

## 2017-06-29 DIAGNOSIS — D509 Iron deficiency anemia, unspecified: Secondary | ICD-10-CM | POA: Diagnosis not present

## 2017-06-29 DIAGNOSIS — D631 Anemia in chronic kidney disease: Secondary | ICD-10-CM | POA: Diagnosis not present

## 2017-06-29 DIAGNOSIS — N186 End stage renal disease: Secondary | ICD-10-CM | POA: Diagnosis not present

## 2017-07-01 DIAGNOSIS — N2581 Secondary hyperparathyroidism of renal origin: Secondary | ICD-10-CM | POA: Diagnosis not present

## 2017-07-01 DIAGNOSIS — N186 End stage renal disease: Secondary | ICD-10-CM | POA: Diagnosis not present

## 2017-07-01 DIAGNOSIS — D631 Anemia in chronic kidney disease: Secondary | ICD-10-CM | POA: Diagnosis not present

## 2017-07-01 DIAGNOSIS — D509 Iron deficiency anemia, unspecified: Secondary | ICD-10-CM | POA: Diagnosis not present

## 2017-07-03 DIAGNOSIS — N186 End stage renal disease: Secondary | ICD-10-CM | POA: Diagnosis not present

## 2017-07-03 DIAGNOSIS — D509 Iron deficiency anemia, unspecified: Secondary | ICD-10-CM | POA: Diagnosis not present

## 2017-07-03 DIAGNOSIS — D631 Anemia in chronic kidney disease: Secondary | ICD-10-CM | POA: Diagnosis not present

## 2017-07-03 DIAGNOSIS — N2581 Secondary hyperparathyroidism of renal origin: Secondary | ICD-10-CM | POA: Diagnosis not present

## 2017-07-06 DIAGNOSIS — D509 Iron deficiency anemia, unspecified: Secondary | ICD-10-CM | POA: Diagnosis not present

## 2017-07-06 DIAGNOSIS — N2581 Secondary hyperparathyroidism of renal origin: Secondary | ICD-10-CM | POA: Diagnosis not present

## 2017-07-06 DIAGNOSIS — D631 Anemia in chronic kidney disease: Secondary | ICD-10-CM | POA: Diagnosis not present

## 2017-07-06 DIAGNOSIS — N186 End stage renal disease: Secondary | ICD-10-CM | POA: Diagnosis not present

## 2017-07-08 DIAGNOSIS — D631 Anemia in chronic kidney disease: Secondary | ICD-10-CM | POA: Diagnosis not present

## 2017-07-08 DIAGNOSIS — N2581 Secondary hyperparathyroidism of renal origin: Secondary | ICD-10-CM | POA: Diagnosis not present

## 2017-07-08 DIAGNOSIS — D509 Iron deficiency anemia, unspecified: Secondary | ICD-10-CM | POA: Diagnosis not present

## 2017-07-08 DIAGNOSIS — N186 End stage renal disease: Secondary | ICD-10-CM | POA: Diagnosis not present

## 2017-07-10 DIAGNOSIS — D509 Iron deficiency anemia, unspecified: Secondary | ICD-10-CM | POA: Diagnosis not present

## 2017-07-10 DIAGNOSIS — N2581 Secondary hyperparathyroidism of renal origin: Secondary | ICD-10-CM | POA: Diagnosis not present

## 2017-07-10 DIAGNOSIS — D631 Anemia in chronic kidney disease: Secondary | ICD-10-CM | POA: Diagnosis not present

## 2017-07-10 DIAGNOSIS — N186 End stage renal disease: Secondary | ICD-10-CM | POA: Diagnosis not present

## 2017-07-12 DIAGNOSIS — N186 End stage renal disease: Secondary | ICD-10-CM | POA: Diagnosis not present

## 2017-07-12 DIAGNOSIS — N2581 Secondary hyperparathyroidism of renal origin: Secondary | ICD-10-CM | POA: Diagnosis not present

## 2017-07-12 DIAGNOSIS — D509 Iron deficiency anemia, unspecified: Secondary | ICD-10-CM | POA: Diagnosis not present

## 2017-07-12 DIAGNOSIS — D631 Anemia in chronic kidney disease: Secondary | ICD-10-CM | POA: Diagnosis not present

## 2017-07-14 DIAGNOSIS — N2581 Secondary hyperparathyroidism of renal origin: Secondary | ICD-10-CM | POA: Diagnosis not present

## 2017-07-14 DIAGNOSIS — D631 Anemia in chronic kidney disease: Secondary | ICD-10-CM | POA: Diagnosis not present

## 2017-07-14 DIAGNOSIS — N186 End stage renal disease: Secondary | ICD-10-CM | POA: Diagnosis not present

## 2017-07-14 DIAGNOSIS — D509 Iron deficiency anemia, unspecified: Secondary | ICD-10-CM | POA: Diagnosis not present

## 2017-07-17 DIAGNOSIS — D631 Anemia in chronic kidney disease: Secondary | ICD-10-CM | POA: Diagnosis not present

## 2017-07-17 DIAGNOSIS — N2581 Secondary hyperparathyroidism of renal origin: Secondary | ICD-10-CM | POA: Diagnosis not present

## 2017-07-17 DIAGNOSIS — D509 Iron deficiency anemia, unspecified: Secondary | ICD-10-CM | POA: Diagnosis not present

## 2017-07-17 DIAGNOSIS — N186 End stage renal disease: Secondary | ICD-10-CM | POA: Diagnosis not present

## 2017-07-20 DIAGNOSIS — N2581 Secondary hyperparathyroidism of renal origin: Secondary | ICD-10-CM | POA: Diagnosis not present

## 2017-07-20 DIAGNOSIS — D509 Iron deficiency anemia, unspecified: Secondary | ICD-10-CM | POA: Diagnosis not present

## 2017-07-20 DIAGNOSIS — N186 End stage renal disease: Secondary | ICD-10-CM | POA: Diagnosis not present

## 2017-07-20 DIAGNOSIS — D631 Anemia in chronic kidney disease: Secondary | ICD-10-CM | POA: Diagnosis not present

## 2017-07-22 DIAGNOSIS — D509 Iron deficiency anemia, unspecified: Secondary | ICD-10-CM | POA: Diagnosis not present

## 2017-07-22 DIAGNOSIS — D631 Anemia in chronic kidney disease: Secondary | ICD-10-CM | POA: Diagnosis not present

## 2017-07-22 DIAGNOSIS — N2581 Secondary hyperparathyroidism of renal origin: Secondary | ICD-10-CM | POA: Diagnosis not present

## 2017-07-22 DIAGNOSIS — N186 End stage renal disease: Secondary | ICD-10-CM | POA: Diagnosis not present

## 2017-07-24 DIAGNOSIS — N2581 Secondary hyperparathyroidism of renal origin: Secondary | ICD-10-CM | POA: Diagnosis not present

## 2017-07-24 DIAGNOSIS — I129 Hypertensive chronic kidney disease with stage 1 through stage 4 chronic kidney disease, or unspecified chronic kidney disease: Secondary | ICD-10-CM | POA: Diagnosis not present

## 2017-07-24 DIAGNOSIS — D509 Iron deficiency anemia, unspecified: Secondary | ICD-10-CM | POA: Diagnosis not present

## 2017-07-24 DIAGNOSIS — D631 Anemia in chronic kidney disease: Secondary | ICD-10-CM | POA: Diagnosis not present

## 2017-07-24 DIAGNOSIS — Z992 Dependence on renal dialysis: Secondary | ICD-10-CM | POA: Diagnosis not present

## 2017-07-24 DIAGNOSIS — N186 End stage renal disease: Secondary | ICD-10-CM | POA: Diagnosis not present

## 2017-07-28 DIAGNOSIS — N186 End stage renal disease: Secondary | ICD-10-CM | POA: Diagnosis not present

## 2017-07-28 DIAGNOSIS — N2581 Secondary hyperparathyroidism of renal origin: Secondary | ICD-10-CM | POA: Diagnosis not present

## 2017-07-28 DIAGNOSIS — D631 Anemia in chronic kidney disease: Secondary | ICD-10-CM | POA: Diagnosis not present

## 2017-07-28 DIAGNOSIS — D509 Iron deficiency anemia, unspecified: Secondary | ICD-10-CM | POA: Diagnosis not present

## 2017-07-29 DIAGNOSIS — N186 End stage renal disease: Secondary | ICD-10-CM | POA: Diagnosis not present

## 2017-07-29 DIAGNOSIS — D631 Anemia in chronic kidney disease: Secondary | ICD-10-CM | POA: Diagnosis not present

## 2017-07-29 DIAGNOSIS — D509 Iron deficiency anemia, unspecified: Secondary | ICD-10-CM | POA: Diagnosis not present

## 2017-07-29 DIAGNOSIS — N2581 Secondary hyperparathyroidism of renal origin: Secondary | ICD-10-CM | POA: Diagnosis not present

## 2017-07-31 DIAGNOSIS — D509 Iron deficiency anemia, unspecified: Secondary | ICD-10-CM | POA: Diagnosis not present

## 2017-07-31 DIAGNOSIS — N186 End stage renal disease: Secondary | ICD-10-CM | POA: Diagnosis not present

## 2017-07-31 DIAGNOSIS — N2581 Secondary hyperparathyroidism of renal origin: Secondary | ICD-10-CM | POA: Diagnosis not present

## 2017-07-31 DIAGNOSIS — D631 Anemia in chronic kidney disease: Secondary | ICD-10-CM | POA: Diagnosis not present

## 2017-08-05 DIAGNOSIS — D509 Iron deficiency anemia, unspecified: Secondary | ICD-10-CM | POA: Diagnosis not present

## 2017-08-05 DIAGNOSIS — N186 End stage renal disease: Secondary | ICD-10-CM | POA: Diagnosis not present

## 2017-08-05 DIAGNOSIS — D631 Anemia in chronic kidney disease: Secondary | ICD-10-CM | POA: Diagnosis not present

## 2017-08-05 DIAGNOSIS — N2581 Secondary hyperparathyroidism of renal origin: Secondary | ICD-10-CM | POA: Diagnosis not present

## 2017-08-07 DIAGNOSIS — N2581 Secondary hyperparathyroidism of renal origin: Secondary | ICD-10-CM | POA: Diagnosis not present

## 2017-08-07 DIAGNOSIS — N186 End stage renal disease: Secondary | ICD-10-CM | POA: Diagnosis not present

## 2017-08-07 DIAGNOSIS — D509 Iron deficiency anemia, unspecified: Secondary | ICD-10-CM | POA: Diagnosis not present

## 2017-08-07 DIAGNOSIS — D631 Anemia in chronic kidney disease: Secondary | ICD-10-CM | POA: Diagnosis not present

## 2017-08-10 DIAGNOSIS — D509 Iron deficiency anemia, unspecified: Secondary | ICD-10-CM | POA: Diagnosis not present

## 2017-08-10 DIAGNOSIS — N186 End stage renal disease: Secondary | ICD-10-CM | POA: Diagnosis not present

## 2017-08-10 DIAGNOSIS — D631 Anemia in chronic kidney disease: Secondary | ICD-10-CM | POA: Diagnosis not present

## 2017-08-10 DIAGNOSIS — N2581 Secondary hyperparathyroidism of renal origin: Secondary | ICD-10-CM | POA: Diagnosis not present

## 2017-08-11 DIAGNOSIS — Z452 Encounter for adjustment and management of vascular access device: Secondary | ICD-10-CM | POA: Diagnosis not present

## 2017-08-12 DIAGNOSIS — D631 Anemia in chronic kidney disease: Secondary | ICD-10-CM | POA: Diagnosis not present

## 2017-08-12 DIAGNOSIS — N186 End stage renal disease: Secondary | ICD-10-CM | POA: Diagnosis not present

## 2017-08-12 DIAGNOSIS — D509 Iron deficiency anemia, unspecified: Secondary | ICD-10-CM | POA: Diagnosis not present

## 2017-08-12 DIAGNOSIS — N2581 Secondary hyperparathyroidism of renal origin: Secondary | ICD-10-CM | POA: Diagnosis not present

## 2017-08-13 ENCOUNTER — Ambulatory Visit: Payer: Medicare Other | Admitting: Internal Medicine

## 2017-08-16 DIAGNOSIS — D509 Iron deficiency anemia, unspecified: Secondary | ICD-10-CM | POA: Diagnosis not present

## 2017-08-16 DIAGNOSIS — D631 Anemia in chronic kidney disease: Secondary | ICD-10-CM | POA: Diagnosis not present

## 2017-08-16 DIAGNOSIS — N186 End stage renal disease: Secondary | ICD-10-CM | POA: Diagnosis not present

## 2017-08-16 DIAGNOSIS — N2581 Secondary hyperparathyroidism of renal origin: Secondary | ICD-10-CM | POA: Diagnosis not present

## 2017-08-19 DIAGNOSIS — N186 End stage renal disease: Secondary | ICD-10-CM | POA: Diagnosis not present

## 2017-08-19 DIAGNOSIS — N2581 Secondary hyperparathyroidism of renal origin: Secondary | ICD-10-CM | POA: Diagnosis not present

## 2017-08-19 DIAGNOSIS — D631 Anemia in chronic kidney disease: Secondary | ICD-10-CM | POA: Diagnosis not present

## 2017-08-19 DIAGNOSIS — D509 Iron deficiency anemia, unspecified: Secondary | ICD-10-CM | POA: Diagnosis not present

## 2017-08-21 DIAGNOSIS — N186 End stage renal disease: Secondary | ICD-10-CM | POA: Diagnosis not present

## 2017-08-21 DIAGNOSIS — D509 Iron deficiency anemia, unspecified: Secondary | ICD-10-CM | POA: Diagnosis not present

## 2017-08-21 DIAGNOSIS — N2581 Secondary hyperparathyroidism of renal origin: Secondary | ICD-10-CM | POA: Diagnosis not present

## 2017-08-21 DIAGNOSIS — D631 Anemia in chronic kidney disease: Secondary | ICD-10-CM | POA: Diagnosis not present

## 2017-08-23 DIAGNOSIS — D509 Iron deficiency anemia, unspecified: Secondary | ICD-10-CM | POA: Diagnosis not present

## 2017-08-23 DIAGNOSIS — N186 End stage renal disease: Secondary | ICD-10-CM | POA: Diagnosis not present

## 2017-08-23 DIAGNOSIS — D631 Anemia in chronic kidney disease: Secondary | ICD-10-CM | POA: Diagnosis not present

## 2017-08-23 DIAGNOSIS — N2581 Secondary hyperparathyroidism of renal origin: Secondary | ICD-10-CM | POA: Diagnosis not present

## 2017-08-24 DIAGNOSIS — Z992 Dependence on renal dialysis: Secondary | ICD-10-CM | POA: Diagnosis not present

## 2017-08-24 DIAGNOSIS — N186 End stage renal disease: Secondary | ICD-10-CM | POA: Diagnosis not present

## 2017-08-24 DIAGNOSIS — I129 Hypertensive chronic kidney disease with stage 1 through stage 4 chronic kidney disease, or unspecified chronic kidney disease: Secondary | ICD-10-CM | POA: Diagnosis not present

## 2017-08-26 DIAGNOSIS — N186 End stage renal disease: Secondary | ICD-10-CM | POA: Diagnosis not present

## 2017-08-26 DIAGNOSIS — D631 Anemia in chronic kidney disease: Secondary | ICD-10-CM | POA: Diagnosis not present

## 2017-08-26 DIAGNOSIS — Z992 Dependence on renal dialysis: Secondary | ICD-10-CM | POA: Diagnosis not present

## 2017-08-26 DIAGNOSIS — D509 Iron deficiency anemia, unspecified: Secondary | ICD-10-CM | POA: Diagnosis not present

## 2017-08-26 DIAGNOSIS — N2581 Secondary hyperparathyroidism of renal origin: Secondary | ICD-10-CM | POA: Diagnosis not present

## 2017-08-28 DIAGNOSIS — N2581 Secondary hyperparathyroidism of renal origin: Secondary | ICD-10-CM | POA: Diagnosis not present

## 2017-08-28 DIAGNOSIS — D509 Iron deficiency anemia, unspecified: Secondary | ICD-10-CM | POA: Diagnosis not present

## 2017-08-28 DIAGNOSIS — D631 Anemia in chronic kidney disease: Secondary | ICD-10-CM | POA: Diagnosis not present

## 2017-08-28 DIAGNOSIS — N186 End stage renal disease: Secondary | ICD-10-CM | POA: Diagnosis not present

## 2017-08-28 DIAGNOSIS — Z992 Dependence on renal dialysis: Secondary | ICD-10-CM | POA: Diagnosis not present

## 2017-08-31 DIAGNOSIS — Z992 Dependence on renal dialysis: Secondary | ICD-10-CM | POA: Diagnosis not present

## 2017-08-31 DIAGNOSIS — D509 Iron deficiency anemia, unspecified: Secondary | ICD-10-CM | POA: Diagnosis not present

## 2017-08-31 DIAGNOSIS — N186 End stage renal disease: Secondary | ICD-10-CM | POA: Diagnosis not present

## 2017-08-31 DIAGNOSIS — N2581 Secondary hyperparathyroidism of renal origin: Secondary | ICD-10-CM | POA: Diagnosis not present

## 2017-08-31 DIAGNOSIS — D631 Anemia in chronic kidney disease: Secondary | ICD-10-CM | POA: Diagnosis not present

## 2017-09-02 DIAGNOSIS — Z992 Dependence on renal dialysis: Secondary | ICD-10-CM | POA: Diagnosis not present

## 2017-09-02 DIAGNOSIS — D509 Iron deficiency anemia, unspecified: Secondary | ICD-10-CM | POA: Diagnosis not present

## 2017-09-02 DIAGNOSIS — N2581 Secondary hyperparathyroidism of renal origin: Secondary | ICD-10-CM | POA: Diagnosis not present

## 2017-09-02 DIAGNOSIS — N186 End stage renal disease: Secondary | ICD-10-CM | POA: Diagnosis not present

## 2017-09-02 DIAGNOSIS — D631 Anemia in chronic kidney disease: Secondary | ICD-10-CM | POA: Diagnosis not present

## 2017-09-04 DIAGNOSIS — Z992 Dependence on renal dialysis: Secondary | ICD-10-CM | POA: Diagnosis not present

## 2017-09-04 DIAGNOSIS — N186 End stage renal disease: Secondary | ICD-10-CM | POA: Diagnosis not present

## 2017-09-04 DIAGNOSIS — D631 Anemia in chronic kidney disease: Secondary | ICD-10-CM | POA: Diagnosis not present

## 2017-09-04 DIAGNOSIS — N2581 Secondary hyperparathyroidism of renal origin: Secondary | ICD-10-CM | POA: Diagnosis not present

## 2017-09-04 DIAGNOSIS — D509 Iron deficiency anemia, unspecified: Secondary | ICD-10-CM | POA: Diagnosis not present

## 2017-09-07 DIAGNOSIS — N186 End stage renal disease: Secondary | ICD-10-CM | POA: Diagnosis not present

## 2017-09-07 DIAGNOSIS — D509 Iron deficiency anemia, unspecified: Secondary | ICD-10-CM | POA: Diagnosis not present

## 2017-09-07 DIAGNOSIS — Z992 Dependence on renal dialysis: Secondary | ICD-10-CM | POA: Diagnosis not present

## 2017-09-07 DIAGNOSIS — N2581 Secondary hyperparathyroidism of renal origin: Secondary | ICD-10-CM | POA: Diagnosis not present

## 2017-09-07 DIAGNOSIS — D631 Anemia in chronic kidney disease: Secondary | ICD-10-CM | POA: Diagnosis not present

## 2017-09-09 DIAGNOSIS — N2581 Secondary hyperparathyroidism of renal origin: Secondary | ICD-10-CM | POA: Diagnosis not present

## 2017-09-09 DIAGNOSIS — Z992 Dependence on renal dialysis: Secondary | ICD-10-CM | POA: Diagnosis not present

## 2017-09-09 DIAGNOSIS — D631 Anemia in chronic kidney disease: Secondary | ICD-10-CM | POA: Diagnosis not present

## 2017-09-09 DIAGNOSIS — D509 Iron deficiency anemia, unspecified: Secondary | ICD-10-CM | POA: Diagnosis not present

## 2017-09-09 DIAGNOSIS — N186 End stage renal disease: Secondary | ICD-10-CM | POA: Diagnosis not present

## 2017-09-11 DIAGNOSIS — N186 End stage renal disease: Secondary | ICD-10-CM | POA: Diagnosis not present

## 2017-09-11 DIAGNOSIS — Z992 Dependence on renal dialysis: Secondary | ICD-10-CM | POA: Diagnosis not present

## 2017-09-11 DIAGNOSIS — D509 Iron deficiency anemia, unspecified: Secondary | ICD-10-CM | POA: Diagnosis not present

## 2017-09-11 DIAGNOSIS — D631 Anemia in chronic kidney disease: Secondary | ICD-10-CM | POA: Diagnosis not present

## 2017-09-11 DIAGNOSIS — N2581 Secondary hyperparathyroidism of renal origin: Secondary | ICD-10-CM | POA: Diagnosis not present

## 2017-09-14 DIAGNOSIS — D631 Anemia in chronic kidney disease: Secondary | ICD-10-CM | POA: Diagnosis not present

## 2017-09-14 DIAGNOSIS — D509 Iron deficiency anemia, unspecified: Secondary | ICD-10-CM | POA: Diagnosis not present

## 2017-09-14 DIAGNOSIS — Z992 Dependence on renal dialysis: Secondary | ICD-10-CM | POA: Diagnosis not present

## 2017-09-14 DIAGNOSIS — N2581 Secondary hyperparathyroidism of renal origin: Secondary | ICD-10-CM | POA: Diagnosis not present

## 2017-09-14 DIAGNOSIS — N186 End stage renal disease: Secondary | ICD-10-CM | POA: Diagnosis not present

## 2017-09-16 DIAGNOSIS — D509 Iron deficiency anemia, unspecified: Secondary | ICD-10-CM | POA: Diagnosis not present

## 2017-09-16 DIAGNOSIS — D631 Anemia in chronic kidney disease: Secondary | ICD-10-CM | POA: Diagnosis not present

## 2017-09-16 DIAGNOSIS — N2581 Secondary hyperparathyroidism of renal origin: Secondary | ICD-10-CM | POA: Diagnosis not present

## 2017-09-16 DIAGNOSIS — N186 End stage renal disease: Secondary | ICD-10-CM | POA: Diagnosis not present

## 2017-09-16 DIAGNOSIS — Z992 Dependence on renal dialysis: Secondary | ICD-10-CM | POA: Diagnosis not present

## 2017-09-18 DIAGNOSIS — D631 Anemia in chronic kidney disease: Secondary | ICD-10-CM | POA: Diagnosis not present

## 2017-09-18 DIAGNOSIS — N186 End stage renal disease: Secondary | ICD-10-CM | POA: Diagnosis not present

## 2017-09-18 DIAGNOSIS — Z992 Dependence on renal dialysis: Secondary | ICD-10-CM | POA: Diagnosis not present

## 2017-09-18 DIAGNOSIS — D509 Iron deficiency anemia, unspecified: Secondary | ICD-10-CM | POA: Diagnosis not present

## 2017-09-18 DIAGNOSIS — N2581 Secondary hyperparathyroidism of renal origin: Secondary | ICD-10-CM | POA: Diagnosis not present

## 2017-09-21 DIAGNOSIS — N2581 Secondary hyperparathyroidism of renal origin: Secondary | ICD-10-CM | POA: Diagnosis not present

## 2017-09-21 DIAGNOSIS — Z992 Dependence on renal dialysis: Secondary | ICD-10-CM | POA: Diagnosis not present

## 2017-09-21 DIAGNOSIS — N186 End stage renal disease: Secondary | ICD-10-CM | POA: Diagnosis not present

## 2017-09-21 DIAGNOSIS — D631 Anemia in chronic kidney disease: Secondary | ICD-10-CM | POA: Diagnosis not present

## 2017-09-21 DIAGNOSIS — D509 Iron deficiency anemia, unspecified: Secondary | ICD-10-CM | POA: Diagnosis not present

## 2017-09-23 DIAGNOSIS — N186 End stage renal disease: Secondary | ICD-10-CM | POA: Diagnosis not present

## 2017-09-23 DIAGNOSIS — D509 Iron deficiency anemia, unspecified: Secondary | ICD-10-CM | POA: Diagnosis not present

## 2017-09-23 DIAGNOSIS — Z992 Dependence on renal dialysis: Secondary | ICD-10-CM | POA: Diagnosis not present

## 2017-09-23 DIAGNOSIS — N2581 Secondary hyperparathyroidism of renal origin: Secondary | ICD-10-CM | POA: Diagnosis not present

## 2017-09-23 DIAGNOSIS — D631 Anemia in chronic kidney disease: Secondary | ICD-10-CM | POA: Diagnosis not present

## 2017-09-24 DIAGNOSIS — D631 Anemia in chronic kidney disease: Secondary | ICD-10-CM | POA: Diagnosis not present

## 2017-09-24 DIAGNOSIS — I129 Hypertensive chronic kidney disease with stage 1 through stage 4 chronic kidney disease, or unspecified chronic kidney disease: Secondary | ICD-10-CM | POA: Diagnosis not present

## 2017-09-24 DIAGNOSIS — D509 Iron deficiency anemia, unspecified: Secondary | ICD-10-CM | POA: Diagnosis not present

## 2017-09-24 DIAGNOSIS — Z992 Dependence on renal dialysis: Secondary | ICD-10-CM | POA: Diagnosis not present

## 2017-09-24 DIAGNOSIS — N186 End stage renal disease: Secondary | ICD-10-CM | POA: Diagnosis not present

## 2017-09-24 DIAGNOSIS — N2581 Secondary hyperparathyroidism of renal origin: Secondary | ICD-10-CM | POA: Diagnosis not present

## 2017-09-25 DIAGNOSIS — I129 Hypertensive chronic kidney disease with stage 1 through stage 4 chronic kidney disease, or unspecified chronic kidney disease: Secondary | ICD-10-CM | POA: Diagnosis not present

## 2017-09-25 DIAGNOSIS — N186 End stage renal disease: Secondary | ICD-10-CM | POA: Diagnosis not present

## 2017-09-25 DIAGNOSIS — Z992 Dependence on renal dialysis: Secondary | ICD-10-CM | POA: Diagnosis not present

## 2017-09-28 DIAGNOSIS — D509 Iron deficiency anemia, unspecified: Secondary | ICD-10-CM | POA: Diagnosis not present

## 2017-09-28 DIAGNOSIS — Z23 Encounter for immunization: Secondary | ICD-10-CM | POA: Diagnosis not present

## 2017-09-28 DIAGNOSIS — N2581 Secondary hyperparathyroidism of renal origin: Secondary | ICD-10-CM | POA: Diagnosis not present

## 2017-09-28 DIAGNOSIS — D631 Anemia in chronic kidney disease: Secondary | ICD-10-CM | POA: Diagnosis not present

## 2017-09-28 DIAGNOSIS — N186 End stage renal disease: Secondary | ICD-10-CM | POA: Diagnosis not present

## 2017-09-29 ENCOUNTER — Ambulatory Visit: Payer: Medicare Other | Attending: Internal Medicine | Admitting: Internal Medicine

## 2017-09-29 ENCOUNTER — Encounter: Payer: Self-pay | Admitting: Internal Medicine

## 2017-09-29 ENCOUNTER — Other Ambulatory Visit: Payer: Self-pay | Admitting: Pharmacist

## 2017-09-29 VITALS — BP 117/78 | HR 68 | Temp 98.0°F | Resp 16 | Ht 67.0 in | Wt 159.4 lb

## 2017-09-29 DIAGNOSIS — Z1331 Encounter for screening for depression: Secondary | ICD-10-CM | POA: Diagnosis not present

## 2017-09-29 DIAGNOSIS — D631 Anemia in chronic kidney disease: Secondary | ICD-10-CM | POA: Diagnosis not present

## 2017-09-29 DIAGNOSIS — I10 Essential (primary) hypertension: Secondary | ICD-10-CM | POA: Diagnosis not present

## 2017-09-29 DIAGNOSIS — I132 Hypertensive heart and chronic kidney disease with heart failure and with stage 5 chronic kidney disease, or end stage renal disease: Secondary | ICD-10-CM | POA: Diagnosis not present

## 2017-09-29 DIAGNOSIS — Z79899 Other long term (current) drug therapy: Secondary | ICD-10-CM | POA: Diagnosis not present

## 2017-09-29 DIAGNOSIS — Z1239 Encounter for other screening for malignant neoplasm of breast: Secondary | ICD-10-CM

## 2017-09-29 DIAGNOSIS — Z886 Allergy status to analgesic agent status: Secondary | ICD-10-CM | POA: Diagnosis not present

## 2017-09-29 DIAGNOSIS — Z87891 Personal history of nicotine dependence: Secondary | ICD-10-CM | POA: Diagnosis not present

## 2017-09-29 DIAGNOSIS — K029 Dental caries, unspecified: Secondary | ICD-10-CM | POA: Insufficient documentation

## 2017-09-29 DIAGNOSIS — Z1211 Encounter for screening for malignant neoplasm of colon: Secondary | ICD-10-CM

## 2017-09-29 DIAGNOSIS — Z7682 Awaiting organ transplant status: Secondary | ICD-10-CM | POA: Insufficient documentation

## 2017-09-29 DIAGNOSIS — I5032 Chronic diastolic (congestive) heart failure: Secondary | ICD-10-CM | POA: Insufficient documentation

## 2017-09-29 DIAGNOSIS — H538 Other visual disturbances: Secondary | ICD-10-CM | POA: Diagnosis not present

## 2017-09-29 DIAGNOSIS — Z8249 Family history of ischemic heart disease and other diseases of the circulatory system: Secondary | ICD-10-CM | POA: Insufficient documentation

## 2017-09-29 DIAGNOSIS — Z992 Dependence on renal dialysis: Secondary | ICD-10-CM | POA: Diagnosis not present

## 2017-09-29 DIAGNOSIS — Z23 Encounter for immunization: Secondary | ICD-10-CM

## 2017-09-29 DIAGNOSIS — Z9889 Other specified postprocedural states: Secondary | ICD-10-CM | POA: Insufficient documentation

## 2017-09-29 DIAGNOSIS — Z1231 Encounter for screening mammogram for malignant neoplasm of breast: Secondary | ICD-10-CM | POA: Diagnosis not present

## 2017-09-29 DIAGNOSIS — N186 End stage renal disease: Secondary | ICD-10-CM | POA: Insufficient documentation

## 2017-09-29 DIAGNOSIS — Z0001 Encounter for general adult medical examination with abnormal findings: Secondary | ICD-10-CM | POA: Diagnosis not present

## 2017-09-29 MED ORDER — LISINOPRIL 30 MG PO TABS: 30.0000 mg | ORAL_TABLET | Freq: Every day | ORAL | 3 refills | Status: DC

## 2017-09-29 MED ORDER — LABETALOL HCL 300 MG PO TABS: 300.0000 mg | ORAL_TABLET | Freq: Two times a day (BID) | ORAL | 3 refills | Status: DC

## 2017-09-29 MED ORDER — TETANUS-DIPHTH-ACELL PERTUSSIS 5-2.5-18.5 LF-MCG/0.5 IM SUSP: 0.5000 mL | Freq: Once | INTRAMUSCULAR | 0 refills | Status: AC

## 2017-09-29 MED ORDER — TETANUS-DIPHTH-ACELL PERTUSSIS 5-2.5-18.5 LF-MCG/0.5 IM SUSP: 0.5000 mL | Freq: Once | INTRAMUSCULAR | Status: DC

## 2017-09-29 MED FILL — BOOSTRIX VACCINE SYRINGE: 5-2.5-18.5 | 1 days supply | Qty: 1 | Fill #0

## 2017-09-29 NOTE — Progress Notes (Signed)
Patient ID: Melody [name], female    DOB: 08-17-1976  MRN: [id number]  CC: re-establish; Hypertension; and Referral (GI/ dentist)   Subjective: Melody [name] is a 42 y.o. female who presents to est care with me.  Last saw Dr. Adrian Blackwater 09/2015 Her concerns today include:  Pt with hx of HTN, tob dep, ESKD, ACD, chronic diastolic CHF  1.  ESKD/HTN:  Started HD 08/2016.  On transplant list at [facility]  HD on Mon/Wed/Fri -reports compliance with Lisinopril, Norvasc and Labetalol -limits salt  2.  ACD:  Reports that she gets iron once a wk during HD  3. Tob dep: she quit cold [place] 1 yr ago  4.  +dep screen:  "I have my moments because I lost my mother last yr."   Denies any ongoing depression at this time  HM:  Had flu vaccine in HD.  Due for tdap.  She request referral for MMG, colonoscopy, dental for routine dental exam and cleaning.  On transplant list at [facility] and told she needs MMG and colonoscopy even though she is not 42 yrs old.  Request referral for eye exam.  + blurred vision since being on HD Patient Active Problem List   Diagnosis Date Noted  . Acute respiratory failure with hypoxia ([place])   . Anemia of chronic disease   . Thrombocytopenia ([place])   . Acute renal failure (ARF) ([place])   . Hypertensive urgency 09/13/2016  . Hypertensive emergency 09/12/2016  . Pelvic fullness in female 10/12/2015  . Accelerated hypertension 03/12/2015  . AKI (acute kidney injury) ([place]) 01/14/2015  . Tobacco abuse   . Drug abuse [facility])      Current Outpatient Medications on File Prior to Visit  Medication Sig Dispense Refill  . amLODipine (NORVASC) 5 MG tablet Take 1 tablet by mouth at bedtime.   0  . calcium acetate (PHOSLO) 667 MG capsule Take 2 capsules (1,334 mg total) by mouth 3 (three) times daily with meals. 180 capsule 0  . multivitamin (RENA-VIT) TABS tablet Take 1 tablet by mouth daily.  0  . acetaminophen (TYLENOL) 500 MG tablet Take 1,000 mg by mouth every 6  (six) hours as needed for mild pain or headache.    . lidocaine-prilocaine (EMLA) cream Apply 1 application topically daily as needed for irritation.  0   No current facility-administered medications on file prior to visit.     Allergies  Allergen Reactions  . Acetaminophen     Tylenol #3 Causes nightmares  . Codeine Other (See Comments)  . Tramadol Hives    Social History   Socioeconomic History  . Marital status: Single    Spouse name: Not on file  . Number of children: 2   . Years of education: some colle  . Highest education level: Not on file  Social Needs  . Financial resource strain: Not on file  . Food insecurity - worry: Not on file  . Food insecurity - inability: Not on file  . Transportation needs - medical: Not on file  . Transportation needs - non-medical: Not on file  Occupational History  . Not on file  Tobacco Use  . Smoking status: Former Smoker    Packs/day: 0.00    Years: 0.00    Pack years: 0.00    Types: Cigars, Cigarettes    Start date: 09/11/2016  . Smokeless tobacco: Never Used  Substance and Sexual Activity  . Alcohol use: Yes    Alcohol/week: 0.0 oz  Comment: social  . Drug use: Yes    Types: Marijuana  . Sexual activity: Yes    Birth control/protection: None  Other Topics Concern  . Not on file  Social History Narrative   Looking for work    7 and 13 yr son's.    3 grandkids     Family History  Problem Relation Age of Onset  . CAD Mother   . Hypertension Mother   . Heart disease Mother   . Kidney failure Mother   . CAD Brother   . Hypertension Other   . Kidney failure Other   . Hypertension Maternal Grandmother   . Kidney failure Maternal Grandmother   . Congestive Heart Failure Maternal Grandmother     Past Surgical History:  Procedure Laterality Date  . AV FISTULA PLACEMENT Right 09/15/2016   Procedure: ARTERIOVENOUS (AV) FISTULA CREATION UPPER RIGHT ARM;  Surgeon: Rosetta Posner, MD;  Location: [facility] OR;  Service:  Vascular;  Laterality: Right;  . AV FISTULA PLACEMENT Left 12/30/2016   Procedure: ARTERIOVENOUS (AV) FISTULA CREATION USING GORETEX STRETCH GRAFT;  Surgeon: Elam Dutch, MD;  Location[facility] OR;  Service: Vascular;  Laterality: Left;  . AV FISTULA PLACEMENT Left 06/15/2017   Procedure: INSERTION OF ARTERIOVENOUS (AV) GORE-TEX GRAFT 6MM X 40CM LEFT ARM;  Surgeon: Rosetta Posner, MD;  Location: [place];  Service: Vascular;  Laterality: Left;  . DILATION AND CURETTAGE OF UTERUS    . INSERTION OF DIALYSIS CATHETER Right 09/15/2016   Procedure: INSERTION OF DIALYSIS CATHETER ON RIGHT SIDE;  Surgeon: Rosetta Posner, MD;  Location: [facility] OR;  Service: Vascular;  Laterality: Right;    ROS: Review of Systems Neg except as above   PHYSICAL EXAM: BP 117/78   Pulse 68   Temp 98 F (36.7 C) (Oral)   Resp 16   Ht 5\' 7"  (1.702 m)   Wt 159 lb 6.4 oz (72.3 kg)   SpO2 97%   BMI 24.97 kg/m   Wt Readings from Last 3 Encounters:  09/29/17 159 lb 6.4 oz (72.3 kg)  06/15/17 149 lb (67.6 kg)  06/09/17 149 lb 8 oz (67.8 kg)    Physical Exam  General appearance - alert, well appearing, AAF and in no distress Mental status - alert, oriented to person, place, and time, normal mood, behavior, speech, dress, motor activity, and thought processes Eyes - slightly pale conjunctiva Nose - normal and patent, no erythema, discharge or polyps Mouth - mucous membranes moist, pharynx normal without lesions.  Decayed RT lower 3rd molar Neck - supple, no significant adenopathy Chest - clear to auscultation, no wheezes, rales or rhonchi, symmetric air entry Heart - normal rate, regular rhythm, normal S1, S2, no murmurs, rubs, clicks or gallops Extremities - good radial and brachial pulses    Chemistry      Component Value Date/Time   NA 140 06/15/2017 0813   K 4.1 06/15/2017 0813   CL 100 (L) 01/04/2017 2321   CO2 26 01/04/2017 2321   BUN 56 (H) 01/04/2017 2321   CREATININE 8.71 (H) 01/04/2017 2321   CREATININE  1.15 (H) 10/12/2015 1703      Component Value Date/Time   CALCIUM 9.5 01/04/2017 2321   CALCIUM 8.1 (L) 09/16/2016 1327   ALKPHOS 42 09/13/2016 0204   AST 17 09/13/2016 0204   ALT 17 09/13/2016 0204   BILITOT 0.6 09/13/2016 0204     Lab Results  Component Value Date   WBC 8.5 01/04/2017  HGB 9.9 (L) 06/15/2017   HCT 29.0 (L) 06/15/2017   MCV 91.0 01/04/2017   PLT 217 01/04/2017   Depression screen PHQ 2/9 09/29/2017 10/12/2015 01/19/2015  Decreased Interest 1 0 0  Down, Depressed, Hopeless 1 1 0  PHQ - 2 Score 2 1 0  Altered sleeping 0 1 -  Tired, decreased energy 1 0 -  Change in appetite 0 0 -  Feeling bad or failure about yourself  0 1 -  Trouble concentrating 0 0 -  Moving slowly or fidgety/restless 0 0 -  Suicidal thoughts 0 0 -  PHQ-9 Score 3 3 -     ASSESSMENT AND PLAN: 1. Essential hypertension At goal - labetalol (NORMODYNE) 300 MG tablet; Take 1 tablet (300 mg total) by mouth 2 (two) times daily.  Dispense: 180 tablet; Refill: 3 - lisinopril (PRINIVIL,ZESTRIL) 30 MG tablet; Take 1 tablet (30 mg total) by mouth at bedtime.  Dispense: 90 tablet; Refill: 3 - Lipid panel - Hepatic Function Panel  2. ESRD (end stage renal disease) on dialysis [facility]) On transplant list - Lipid panel - Hepatic Function Panel  3. Former tobacco use Commended her on quitting  4. Anemia due to chronic kidney disease, on chronic dialysis ([place]) -reportedly gets iron once a wk during HD  5. Need for Tdap vaccination Will get at pharmacy today  6. Breast cancer screening - MM Digital Screening; Future  7. Dental caries noted on examination - Ambulatory referral to Dentistry  8. Colon cancer screening - Ambulatory referral to Gastroenterology  9. Blurred vision, bilateral - Ambulatory referral to Ophthalmology  10. Positive depression screening -pt reports this is not a major issue for her  Patient was given the opportunity to ask questions.  Patient verbalized  understanding of the plan and was able to repeat key elements of the plan.   Orders Placed This Encounter  Procedures  . MM Digital Screening  . Lipid panel  . Hepatic Function Panel  . Ambulatory referral to Gastroenterology  . Ambulatory referral to Dentistry  . Ambulatory referral to Ophthalmology     Requested Prescriptions   Signed Prescriptions Disp Refills  . labetalol (NORMODYNE) 300 MG tablet 180 tablet 3    Sig: Take 1 tablet (300 mg total) by mouth 2 (two) times daily.  Marland Kitchen lisinopril (PRINIVIL,ZESTRIL) 30 MG tablet 90 tablet 3    Sig: Take 1 tablet (30 mg total) by mouth at bedtime.    Return in about 4 months (around 01/27/2018).  Karle Plumber, MD, FACP

## 2017-09-29 NOTE — Progress Notes (Signed)
Pt is wanting to know how her kidneys are functioning  Pt is also needing a mammorgram

## 2017-09-29 NOTE — Patient Instructions (Signed)
Td Vaccine (Tetanus and Diphtheria): What You Need to Know 1. Why get vaccinated? Tetanus  and diphtheria are very serious diseases. They are rare in the [place] today, but people who do become infected often have severe complications. Td vaccine is used to protect adolescents and adults from both of these diseases. Both tetanus and diphtheria are infections caused by bacteria. Diphtheria spreads from person to person through coughing or sneezing. Tetanus-causing bacteria enter the body through cuts, scratches, or wounds. TETANUS (lockjaw) causes painful muscle tightening and stiffness, usually all over the body.  It can lead to tightening of muscles in the head and neck so you can't open your mouth, swallow, or sometimes even breathe. Tetanus kills about 1 out of every 10 people who are infected even after receiving the best medical care.  DIPHTHERIA can cause a thick coating to form in the back of the throat.  It can lead to breathing problems, paralysis, heart failure, and death.  Before vaccines, as many as 200,000 cases of diphtheria and hundreds of cases of tetanus were reported in the [place] each year. Since vaccination began, reports of cases for both diseases have dropped by about 99%. 2. Td vaccine Td vaccine can protect adolescents and adults from tetanus and diphtheria. Td is usually given as a booster dose every 10 years but it can also be given earlier after a severe and dirty wound or burn. Another vaccine, called Tdap, which protects against pertussis in addition to tetanus and diphtheria, is sometimes recommended instead of Td vaccine. Your doctor or the person giving you the vaccine can give you more information. Td may safely be given at the same time as other vaccines. 3. Some people should not get this vaccine  A person who has ever had a life-threatening allergic reaction after a previous dose of any tetanus or diphtheria containing vaccine, OR has a severe  allergy to any part of this vaccine, should not get Td vaccine. Tell the person giving the vaccine about any severe allergies.  Talk to your doctor if you: ? had severe pain or swelling after any vaccine containing diphtheria or tetanus, ? ever had a condition called Guillain Barre Syndrome (GBS), ? aren't feeling well on the day the shot is scheduled. 4. What are the risks from Td vaccine? With any medicine, including vaccines, there is a chance of side effects. These are usually mild and go away on their own. Serious reactions are also possible but are rare. Most people who get Td vaccine do not have any problems with it. Mild problems following Td vaccine: (Did not interfere with activities)  Pain where the shot was given (about 8 people in 10)  Redness or swelling where the shot was given (about 1 person in 4)  Mild fever (rare)  Headache (about 1 person in 4)  Tiredness (about 1 person in 4)  Moderate problems following Td vaccine: (Interfered with activities, but did not require medical attention)  Fever over 102F (rare)  Severe problems following Td vaccine: (Unable to perform usual activities; required medical attention)  Swelling, severe pain, bleeding and/or redness in the arm where the shot was given (rare).  Problems that could happen after any vaccine:  People sometimes faint after a medical procedure, including vaccination. Sitting or lying down for about 15 minutes can help prevent fainting, and injuries caused by a fall. Tell your doctor if you feel dizzy, or have vision changes or ringing in the ears.  Some people get   severe pain in the shoulder and have difficulty moving the arm where a shot was given. This happens very rarely.  Any medication can cause a severe allergic reaction. Such reactions from a vaccine are very rare, estimated at fewer than 1 in a million doses, and would happen within a few minutes to a few hours after the vaccination. As with any  medicine, there is a very remote chance of a vaccine causing a serious injury or death. The safety of vaccines is always being monitored. For more information, visit: www.cdc.gov/vaccinesafety/ 5. What if there is a serious reaction? What should I look for? Look for anything that concerns you, such as signs of a severe allergic reaction, very high fever, or unusual behavior. Signs of a severe allergic reaction can include hives, swelling of the face and throat, difficulty breathing, a fast heartbeat, dizziness, and weakness. These would usually start a few minutes to a few hours after the vaccination. What should I do?  If you think it is a severe allergic reaction or other emergency that can't wait, call 9-1-1 or get the person to the nearest hospital. Otherwise, call your doctor.  Afterward, the reaction should be reported to the Vaccine Adverse Event Reporting System (VAERS). Your doctor might file this report, or you can do it yourself through the VAERS web site at www.vaers.hhs.gov, or by calling 1-800-822-7967. ? VAERS does not give medical advice. 6. The National Vaccine Injury Compensation Program The National Vaccine Injury Compensation Program (VICP) is a federal program that was created to compensate people who may have been injured by certain vaccines. Persons who believe they may have been injured by a vaccine can learn about the program and about filing a claim by calling 1-800-338-2382 or visiting the VICP website at www.hrsa.gov/vaccinecompensation. There is a time limit to file a claim for compensation. 7. How can I learn more?  Ask your doctor. He or she can give you the vaccine package insert or suggest other sources of information.  Call your local or state health department.  Contact the Centers for Disease Control and Prevention (CDC): ? Call 1-800-232-4636 (1-800-CDC-INFO) ? Visit CDC's website at www.cdc.gov/vaccines CDC Td Vaccine VIS (12/04/15) This information is  not intended to replace advice given to you by your health care provider. Make sure you discuss any questions you have with your health care provider. Document Released: 06/08/2006 Document Revised: 05/01/2016 Document Reviewed: 05/01/2016 Elsevier Interactive Patient Education  2017 Elsevier Inc.  

## 2017-09-30 ENCOUNTER — Telehealth: Payer: Self-pay

## 2017-09-30 DIAGNOSIS — D631 Anemia in chronic kidney disease: Secondary | ICD-10-CM | POA: Diagnosis not present

## 2017-09-30 DIAGNOSIS — D509 Iron deficiency anemia, unspecified: Secondary | ICD-10-CM | POA: Diagnosis not present

## 2017-09-30 DIAGNOSIS — Z23 Encounter for immunization: Secondary | ICD-10-CM | POA: Diagnosis not present

## 2017-09-30 DIAGNOSIS — N186 End stage renal disease: Secondary | ICD-10-CM | POA: Diagnosis not present

## 2017-09-30 DIAGNOSIS — N2581 Secondary hyperparathyroidism of renal origin: Secondary | ICD-10-CM | POA: Diagnosis not present

## 2017-09-30 LAB — HEPATIC FUNCTION PANEL
ALT: 14 IU/L (ref 0–32)
AST: 14 IU/L (ref 0–40)
Albumin: 4.5 g/dL (ref 3.5–5.5)
Alkaline Phosphatase: 48 IU/L (ref 39–117)
Bilirubin Total: <0.2 mg/dL (ref 0.0–1.2)
Bilirubin, Direct: 0.07 mg/dL (ref 0.00–0.40)
Total Protein: 6.9 g/dL (ref 6.0–8.5)

## 2017-09-30 LAB — LIPID PANEL
CHOLESTEROL TOTAL: 285 mg/dL — AB (ref 100–199)
Chol/HDL Ratio: 3.1 ratio (ref 0.0–4.4)
HDL: 91 mg/dL (ref >39)
LDL CALC: 176 mg/dL — AB (ref 0–99)
Triglycerides: 89 mg/dL (ref 0–149)
VLDL Cholesterol Cal: 18 mg/dL (ref 5–40)

## 2017-09-30 NOTE — Telephone Encounter (Signed)
Contacted pt to go over lab results pt is aware and doesn't have any questions or concerns 

## 2017-10-02 DIAGNOSIS — N2581 Secondary hyperparathyroidism of renal origin: Secondary | ICD-10-CM | POA: Diagnosis not present

## 2017-10-02 DIAGNOSIS — Z23 Encounter for immunization: Secondary | ICD-10-CM | POA: Diagnosis not present

## 2017-10-02 DIAGNOSIS — D631 Anemia in chronic kidney disease: Secondary | ICD-10-CM | POA: Diagnosis not present

## 2017-10-02 DIAGNOSIS — D509 Iron deficiency anemia, unspecified: Secondary | ICD-10-CM | POA: Diagnosis not present

## 2017-10-02 DIAGNOSIS — N186 End stage renal disease: Secondary | ICD-10-CM | POA: Diagnosis not present

## 2017-10-05 DIAGNOSIS — D631 Anemia in chronic kidney disease: Secondary | ICD-10-CM | POA: Diagnosis not present

## 2017-10-05 DIAGNOSIS — N186 End stage renal disease: Secondary | ICD-10-CM | POA: Diagnosis not present

## 2017-10-05 DIAGNOSIS — D509 Iron deficiency anemia, unspecified: Secondary | ICD-10-CM | POA: Diagnosis not present

## 2017-10-05 DIAGNOSIS — Z23 Encounter for immunization: Secondary | ICD-10-CM | POA: Diagnosis not present

## 2017-10-05 DIAGNOSIS — N2581 Secondary hyperparathyroidism of renal origin: Secondary | ICD-10-CM | POA: Diagnosis not present

## 2017-10-07 DIAGNOSIS — D631 Anemia in chronic kidney disease: Secondary | ICD-10-CM | POA: Diagnosis not present

## 2017-10-07 DIAGNOSIS — D509 Iron deficiency anemia, unspecified: Secondary | ICD-10-CM | POA: Diagnosis not present

## 2017-10-07 DIAGNOSIS — N2581 Secondary hyperparathyroidism of renal origin: Secondary | ICD-10-CM | POA: Diagnosis not present

## 2017-10-07 DIAGNOSIS — Z23 Encounter for immunization: Secondary | ICD-10-CM | POA: Diagnosis not present

## 2017-10-07 DIAGNOSIS — N186 End stage renal disease: Secondary | ICD-10-CM | POA: Diagnosis not present

## 2017-10-09 DIAGNOSIS — D509 Iron deficiency anemia, unspecified: Secondary | ICD-10-CM | POA: Diagnosis not present

## 2017-10-09 DIAGNOSIS — N2581 Secondary hyperparathyroidism of renal origin: Secondary | ICD-10-CM | POA: Diagnosis not present

## 2017-10-09 DIAGNOSIS — Z23 Encounter for immunization: Secondary | ICD-10-CM | POA: Diagnosis not present

## 2017-10-09 DIAGNOSIS — D631 Anemia in chronic kidney disease: Secondary | ICD-10-CM | POA: Diagnosis not present

## 2017-10-09 DIAGNOSIS — N186 End stage renal disease: Secondary | ICD-10-CM | POA: Diagnosis not present

## 2017-10-12 DIAGNOSIS — N2581 Secondary hyperparathyroidism of renal origin: Secondary | ICD-10-CM | POA: Diagnosis not present

## 2017-10-12 DIAGNOSIS — Z23 Encounter for immunization: Secondary | ICD-10-CM | POA: Diagnosis not present

## 2017-10-12 DIAGNOSIS — N186 End stage renal disease: Secondary | ICD-10-CM | POA: Diagnosis not present

## 2017-10-12 DIAGNOSIS — D631 Anemia in chronic kidney disease: Secondary | ICD-10-CM | POA: Diagnosis not present

## 2017-10-12 DIAGNOSIS — D509 Iron deficiency anemia, unspecified: Secondary | ICD-10-CM | POA: Diagnosis not present

## 2017-10-14 DIAGNOSIS — D509 Iron deficiency anemia, unspecified: Secondary | ICD-10-CM | POA: Diagnosis not present

## 2017-10-14 DIAGNOSIS — N2581 Secondary hyperparathyroidism of renal origin: Secondary | ICD-10-CM | POA: Diagnosis not present

## 2017-10-14 DIAGNOSIS — N186 End stage renal disease: Secondary | ICD-10-CM | POA: Diagnosis not present

## 2017-10-14 DIAGNOSIS — Z23 Encounter for immunization: Secondary | ICD-10-CM | POA: Diagnosis not present

## 2017-10-14 DIAGNOSIS — D631 Anemia in chronic kidney disease: Secondary | ICD-10-CM | POA: Diagnosis not present

## 2017-10-16 DIAGNOSIS — I871 Compression of vein: Secondary | ICD-10-CM | POA: Diagnosis not present

## 2017-10-16 DIAGNOSIS — Z992 Dependence on renal dialysis: Secondary | ICD-10-CM | POA: Diagnosis not present

## 2017-10-16 DIAGNOSIS — N186 End stage renal disease: Secondary | ICD-10-CM | POA: Diagnosis not present

## 2017-10-16 DIAGNOSIS — T82868A Thrombosis of vascular prosthetic devices, implants and grafts, initial encounter: Secondary | ICD-10-CM | POA: Diagnosis not present

## 2017-10-17 DIAGNOSIS — D631 Anemia in chronic kidney disease: Secondary | ICD-10-CM | POA: Diagnosis not present

## 2017-10-17 DIAGNOSIS — D509 Iron deficiency anemia, unspecified: Secondary | ICD-10-CM | POA: Diagnosis not present

## 2017-10-17 DIAGNOSIS — Z23 Encounter for immunization: Secondary | ICD-10-CM | POA: Diagnosis not present

## 2017-10-17 DIAGNOSIS — N2581 Secondary hyperparathyroidism of renal origin: Secondary | ICD-10-CM | POA: Diagnosis not present

## 2017-10-17 DIAGNOSIS — N186 End stage renal disease: Secondary | ICD-10-CM | POA: Diagnosis not present

## 2017-10-19 DIAGNOSIS — D631 Anemia in chronic kidney disease: Secondary | ICD-10-CM | POA: Diagnosis not present

## 2017-10-19 DIAGNOSIS — D509 Iron deficiency anemia, unspecified: Secondary | ICD-10-CM | POA: Diagnosis not present

## 2017-10-19 DIAGNOSIS — N2581 Secondary hyperparathyroidism of renal origin: Secondary | ICD-10-CM | POA: Diagnosis not present

## 2017-10-19 DIAGNOSIS — Z23 Encounter for immunization: Secondary | ICD-10-CM | POA: Diagnosis not present

## 2017-10-19 DIAGNOSIS — N186 End stage renal disease: Secondary | ICD-10-CM | POA: Diagnosis not present

## 2017-10-21 DIAGNOSIS — Z23 Encounter for immunization: Secondary | ICD-10-CM | POA: Diagnosis not present

## 2017-10-21 DIAGNOSIS — D509 Iron deficiency anemia, unspecified: Secondary | ICD-10-CM | POA: Diagnosis not present

## 2017-10-21 DIAGNOSIS — N2581 Secondary hyperparathyroidism of renal origin: Secondary | ICD-10-CM | POA: Diagnosis not present

## 2017-10-21 DIAGNOSIS — D631 Anemia in chronic kidney disease: Secondary | ICD-10-CM | POA: Diagnosis not present

## 2017-10-21 DIAGNOSIS — N186 End stage renal disease: Secondary | ICD-10-CM | POA: Diagnosis not present

## 2017-10-23 DIAGNOSIS — N2581 Secondary hyperparathyroidism of renal origin: Secondary | ICD-10-CM | POA: Diagnosis not present

## 2017-10-23 DIAGNOSIS — I129 Hypertensive chronic kidney disease with stage 1 through stage 4 chronic kidney disease, or unspecified chronic kidney disease: Secondary | ICD-10-CM | POA: Diagnosis not present

## 2017-10-23 DIAGNOSIS — D509 Iron deficiency anemia, unspecified: Secondary | ICD-10-CM | POA: Diagnosis not present

## 2017-10-23 DIAGNOSIS — D631 Anemia in chronic kidney disease: Secondary | ICD-10-CM | POA: Diagnosis not present

## 2017-10-23 DIAGNOSIS — Z992 Dependence on renal dialysis: Secondary | ICD-10-CM | POA: Diagnosis not present

## 2017-10-23 DIAGNOSIS — N186 End stage renal disease: Secondary | ICD-10-CM | POA: Diagnosis not present

## 2017-10-26 DIAGNOSIS — N2581 Secondary hyperparathyroidism of renal origin: Secondary | ICD-10-CM | POA: Diagnosis not present

## 2017-10-26 DIAGNOSIS — N186 End stage renal disease: Secondary | ICD-10-CM | POA: Diagnosis not present

## 2017-10-26 DIAGNOSIS — D631 Anemia in chronic kidney disease: Secondary | ICD-10-CM | POA: Diagnosis not present

## 2017-10-26 DIAGNOSIS — D509 Iron deficiency anemia, unspecified: Secondary | ICD-10-CM | POA: Diagnosis not present

## 2017-10-28 DIAGNOSIS — D631 Anemia in chronic kidney disease: Secondary | ICD-10-CM | POA: Diagnosis not present

## 2017-10-28 DIAGNOSIS — D509 Iron deficiency anemia, unspecified: Secondary | ICD-10-CM | POA: Diagnosis not present

## 2017-10-28 DIAGNOSIS — N2581 Secondary hyperparathyroidism of renal origin: Secondary | ICD-10-CM | POA: Diagnosis not present

## 2017-10-28 DIAGNOSIS — N186 End stage renal disease: Secondary | ICD-10-CM | POA: Diagnosis not present

## 2017-10-29 ENCOUNTER — Encounter: Payer: Self-pay | Admitting: Gastroenterology

## 2017-10-30 DIAGNOSIS — D509 Iron deficiency anemia, unspecified: Secondary | ICD-10-CM | POA: Diagnosis not present

## 2017-10-30 DIAGNOSIS — N186 End stage renal disease: Secondary | ICD-10-CM | POA: Diagnosis not present

## 2017-10-30 DIAGNOSIS — D631 Anemia in chronic kidney disease: Secondary | ICD-10-CM | POA: Diagnosis not present

## 2017-10-30 DIAGNOSIS — N2581 Secondary hyperparathyroidism of renal origin: Secondary | ICD-10-CM | POA: Diagnosis not present

## 2017-11-02 DIAGNOSIS — D509 Iron deficiency anemia, unspecified: Secondary | ICD-10-CM | POA: Diagnosis not present

## 2017-11-02 DIAGNOSIS — N2581 Secondary hyperparathyroidism of renal origin: Secondary | ICD-10-CM | POA: Diagnosis not present

## 2017-11-02 DIAGNOSIS — N186 End stage renal disease: Secondary | ICD-10-CM | POA: Diagnosis not present

## 2017-11-02 DIAGNOSIS — D631 Anemia in chronic kidney disease: Secondary | ICD-10-CM | POA: Diagnosis not present

## 2017-11-04 DIAGNOSIS — N2581 Secondary hyperparathyroidism of renal origin: Secondary | ICD-10-CM | POA: Diagnosis not present

## 2017-11-04 DIAGNOSIS — D631 Anemia in chronic kidney disease: Secondary | ICD-10-CM | POA: Diagnosis not present

## 2017-11-04 DIAGNOSIS — D509 Iron deficiency anemia, unspecified: Secondary | ICD-10-CM | POA: Diagnosis not present

## 2017-11-04 DIAGNOSIS — N186 End stage renal disease: Secondary | ICD-10-CM | POA: Diagnosis not present

## 2017-11-06 DIAGNOSIS — D509 Iron deficiency anemia, unspecified: Secondary | ICD-10-CM | POA: Diagnosis not present

## 2017-11-06 DIAGNOSIS — N2581 Secondary hyperparathyroidism of renal origin: Secondary | ICD-10-CM | POA: Diagnosis not present

## 2017-11-06 DIAGNOSIS — N186 End stage renal disease: Secondary | ICD-10-CM | POA: Diagnosis not present

## 2017-11-06 DIAGNOSIS — D631 Anemia in chronic kidney disease: Secondary | ICD-10-CM | POA: Diagnosis not present

## 2017-11-09 DIAGNOSIS — D509 Iron deficiency anemia, unspecified: Secondary | ICD-10-CM | POA: Diagnosis not present

## 2017-11-09 DIAGNOSIS — D631 Anemia in chronic kidney disease: Secondary | ICD-10-CM | POA: Diagnosis not present

## 2017-11-09 DIAGNOSIS — N186 End stage renal disease: Secondary | ICD-10-CM | POA: Diagnosis not present

## 2017-11-09 DIAGNOSIS — N2581 Secondary hyperparathyroidism of renal origin: Secondary | ICD-10-CM | POA: Diagnosis not present

## 2017-11-11 DIAGNOSIS — D509 Iron deficiency anemia, unspecified: Secondary | ICD-10-CM | POA: Diagnosis not present

## 2017-11-11 DIAGNOSIS — N186 End stage renal disease: Secondary | ICD-10-CM | POA: Diagnosis not present

## 2017-11-11 DIAGNOSIS — N2581 Secondary hyperparathyroidism of renal origin: Secondary | ICD-10-CM | POA: Diagnosis not present

## 2017-11-11 DIAGNOSIS — D631 Anemia in chronic kidney disease: Secondary | ICD-10-CM | POA: Diagnosis not present

## 2017-11-13 DIAGNOSIS — N186 End stage renal disease: Secondary | ICD-10-CM | POA: Diagnosis not present

## 2017-11-13 DIAGNOSIS — D631 Anemia in chronic kidney disease: Secondary | ICD-10-CM | POA: Diagnosis not present

## 2017-11-13 DIAGNOSIS — D509 Iron deficiency anemia, unspecified: Secondary | ICD-10-CM | POA: Diagnosis not present

## 2017-11-13 DIAGNOSIS — N2581 Secondary hyperparathyroidism of renal origin: Secondary | ICD-10-CM | POA: Diagnosis not present

## 2017-11-16 DIAGNOSIS — D509 Iron deficiency anemia, unspecified: Secondary | ICD-10-CM | POA: Diagnosis not present

## 2017-11-16 DIAGNOSIS — N186 End stage renal disease: Secondary | ICD-10-CM | POA: Diagnosis not present

## 2017-11-16 DIAGNOSIS — D631 Anemia in chronic kidney disease: Secondary | ICD-10-CM | POA: Diagnosis not present

## 2017-11-16 DIAGNOSIS — N2581 Secondary hyperparathyroidism of renal origin: Secondary | ICD-10-CM | POA: Diagnosis not present

## 2017-11-18 DIAGNOSIS — D631 Anemia in chronic kidney disease: Secondary | ICD-10-CM | POA: Diagnosis not present

## 2017-11-18 DIAGNOSIS — D509 Iron deficiency anemia, unspecified: Secondary | ICD-10-CM | POA: Diagnosis not present

## 2017-11-18 DIAGNOSIS — N2581 Secondary hyperparathyroidism of renal origin: Secondary | ICD-10-CM | POA: Diagnosis not present

## 2017-11-18 DIAGNOSIS — N186 End stage renal disease: Secondary | ICD-10-CM | POA: Diagnosis not present

## 2017-11-20 DIAGNOSIS — N2581 Secondary hyperparathyroidism of renal origin: Secondary | ICD-10-CM | POA: Diagnosis not present

## 2017-11-20 DIAGNOSIS — D509 Iron deficiency anemia, unspecified: Secondary | ICD-10-CM | POA: Diagnosis not present

## 2017-11-20 DIAGNOSIS — N186 End stage renal disease: Secondary | ICD-10-CM | POA: Diagnosis not present

## 2017-11-20 DIAGNOSIS — D631 Anemia in chronic kidney disease: Secondary | ICD-10-CM | POA: Diagnosis not present

## 2017-11-23 DIAGNOSIS — D509 Iron deficiency anemia, unspecified: Secondary | ICD-10-CM | POA: Diagnosis not present

## 2017-11-23 DIAGNOSIS — I129 Hypertensive chronic kidney disease with stage 1 through stage 4 chronic kidney disease, or unspecified chronic kidney disease: Secondary | ICD-10-CM | POA: Diagnosis not present

## 2017-11-23 DIAGNOSIS — N186 End stage renal disease: Secondary | ICD-10-CM | POA: Diagnosis not present

## 2017-11-23 DIAGNOSIS — D631 Anemia in chronic kidney disease: Secondary | ICD-10-CM | POA: Diagnosis not present

## 2017-11-23 DIAGNOSIS — N2581 Secondary hyperparathyroidism of renal origin: Secondary | ICD-10-CM | POA: Diagnosis not present

## 2017-11-23 DIAGNOSIS — Z992 Dependence on renal dialysis: Secondary | ICD-10-CM | POA: Diagnosis not present

## 2017-11-25 DIAGNOSIS — N2581 Secondary hyperparathyroidism of renal origin: Secondary | ICD-10-CM | POA: Diagnosis not present

## 2017-11-25 DIAGNOSIS — N186 End stage renal disease: Secondary | ICD-10-CM | POA: Diagnosis not present

## 2017-11-25 DIAGNOSIS — D631 Anemia in chronic kidney disease: Secondary | ICD-10-CM | POA: Diagnosis not present

## 2017-11-25 DIAGNOSIS — D509 Iron deficiency anemia, unspecified: Secondary | ICD-10-CM | POA: Diagnosis not present

## 2017-11-27 DIAGNOSIS — N2581 Secondary hyperparathyroidism of renal origin: Secondary | ICD-10-CM | POA: Diagnosis not present

## 2017-11-27 DIAGNOSIS — N186 End stage renal disease: Secondary | ICD-10-CM | POA: Diagnosis not present

## 2017-11-27 DIAGNOSIS — D509 Iron deficiency anemia, unspecified: Secondary | ICD-10-CM | POA: Diagnosis not present

## 2017-11-27 DIAGNOSIS — D631 Anemia in chronic kidney disease: Secondary | ICD-10-CM | POA: Diagnosis not present

## 2017-11-30 DIAGNOSIS — D509 Iron deficiency anemia, unspecified: Secondary | ICD-10-CM | POA: Diagnosis not present

## 2017-11-30 DIAGNOSIS — N2581 Secondary hyperparathyroidism of renal origin: Secondary | ICD-10-CM | POA: Diagnosis not present

## 2017-11-30 DIAGNOSIS — N186 End stage renal disease: Secondary | ICD-10-CM | POA: Diagnosis not present

## 2017-11-30 DIAGNOSIS — D631 Anemia in chronic kidney disease: Secondary | ICD-10-CM | POA: Diagnosis not present

## 2017-12-02 DIAGNOSIS — N186 End stage renal disease: Secondary | ICD-10-CM | POA: Diagnosis not present

## 2017-12-02 DIAGNOSIS — D631 Anemia in chronic kidney disease: Secondary | ICD-10-CM | POA: Diagnosis not present

## 2017-12-02 DIAGNOSIS — D509 Iron deficiency anemia, unspecified: Secondary | ICD-10-CM | POA: Diagnosis not present

## 2017-12-02 DIAGNOSIS — N2581 Secondary hyperparathyroidism of renal origin: Secondary | ICD-10-CM | POA: Diagnosis not present

## 2017-12-04 DIAGNOSIS — N2581 Secondary hyperparathyroidism of renal origin: Secondary | ICD-10-CM | POA: Diagnosis not present

## 2017-12-04 DIAGNOSIS — N186 End stage renal disease: Secondary | ICD-10-CM | POA: Diagnosis not present

## 2017-12-04 DIAGNOSIS — D509 Iron deficiency anemia, unspecified: Secondary | ICD-10-CM | POA: Diagnosis not present

## 2017-12-04 DIAGNOSIS — D631 Anemia in chronic kidney disease: Secondary | ICD-10-CM | POA: Diagnosis not present

## 2017-12-07 DIAGNOSIS — D631 Anemia in chronic kidney disease: Secondary | ICD-10-CM | POA: Diagnosis not present

## 2017-12-07 DIAGNOSIS — N186 End stage renal disease: Secondary | ICD-10-CM | POA: Diagnosis not present

## 2017-12-07 DIAGNOSIS — D509 Iron deficiency anemia, unspecified: Secondary | ICD-10-CM | POA: Diagnosis not present

## 2017-12-07 DIAGNOSIS — N2581 Secondary hyperparathyroidism of renal origin: Secondary | ICD-10-CM | POA: Diagnosis not present

## 2017-12-09 DIAGNOSIS — D509 Iron deficiency anemia, unspecified: Secondary | ICD-10-CM | POA: Diagnosis not present

## 2017-12-09 DIAGNOSIS — D631 Anemia in chronic kidney disease: Secondary | ICD-10-CM | POA: Diagnosis not present

## 2017-12-09 DIAGNOSIS — N2581 Secondary hyperparathyroidism of renal origin: Secondary | ICD-10-CM | POA: Diagnosis not present

## 2017-12-09 DIAGNOSIS — N186 End stage renal disease: Secondary | ICD-10-CM | POA: Diagnosis not present

## 2017-12-11 DIAGNOSIS — D509 Iron deficiency anemia, unspecified: Secondary | ICD-10-CM | POA: Diagnosis not present

## 2017-12-11 DIAGNOSIS — N2581 Secondary hyperparathyroidism of renal origin: Secondary | ICD-10-CM | POA: Diagnosis not present

## 2017-12-11 DIAGNOSIS — N186 End stage renal disease: Secondary | ICD-10-CM | POA: Diagnosis not present

## 2017-12-11 DIAGNOSIS — D631 Anemia in chronic kidney disease: Secondary | ICD-10-CM | POA: Diagnosis not present

## 2017-12-14 ENCOUNTER — Ambulatory Visit: Payer: Medicare Other | Admitting: Gastroenterology

## 2017-12-14 DIAGNOSIS — D509 Iron deficiency anemia, unspecified: Secondary | ICD-10-CM | POA: Diagnosis not present

## 2017-12-14 DIAGNOSIS — N2581 Secondary hyperparathyroidism of renal origin: Secondary | ICD-10-CM | POA: Diagnosis not present

## 2017-12-14 DIAGNOSIS — N186 End stage renal disease: Secondary | ICD-10-CM | POA: Diagnosis not present

## 2017-12-14 DIAGNOSIS — D631 Anemia in chronic kidney disease: Secondary | ICD-10-CM | POA: Diagnosis not present

## 2017-12-16 DIAGNOSIS — N186 End stage renal disease: Secondary | ICD-10-CM | POA: Diagnosis not present

## 2017-12-16 DIAGNOSIS — D631 Anemia in chronic kidney disease: Secondary | ICD-10-CM | POA: Diagnosis not present

## 2017-12-16 DIAGNOSIS — N2581 Secondary hyperparathyroidism of renal origin: Secondary | ICD-10-CM | POA: Diagnosis not present

## 2017-12-16 DIAGNOSIS — D509 Iron deficiency anemia, unspecified: Secondary | ICD-10-CM | POA: Diagnosis not present

## 2017-12-18 ENCOUNTER — Encounter: Payer: Self-pay | Admitting: Internal Medicine

## 2017-12-18 DIAGNOSIS — D509 Iron deficiency anemia, unspecified: Secondary | ICD-10-CM | POA: Diagnosis not present

## 2017-12-18 DIAGNOSIS — N186 End stage renal disease: Secondary | ICD-10-CM | POA: Diagnosis not present

## 2017-12-18 DIAGNOSIS — N2581 Secondary hyperparathyroidism of renal origin: Secondary | ICD-10-CM | POA: Diagnosis not present

## 2017-12-18 DIAGNOSIS — D631 Anemia in chronic kidney disease: Secondary | ICD-10-CM | POA: Diagnosis not present

## 2017-12-21 DIAGNOSIS — N2581 Secondary hyperparathyroidism of renal origin: Secondary | ICD-10-CM | POA: Diagnosis not present

## 2017-12-21 DIAGNOSIS — D509 Iron deficiency anemia, unspecified: Secondary | ICD-10-CM | POA: Diagnosis not present

## 2017-12-21 DIAGNOSIS — D631 Anemia in chronic kidney disease: Secondary | ICD-10-CM | POA: Diagnosis not present

## 2017-12-21 DIAGNOSIS — N186 End stage renal disease: Secondary | ICD-10-CM | POA: Diagnosis not present

## 2017-12-22 ENCOUNTER — Encounter: Payer: Self-pay | Admitting: Internal Medicine

## 2017-12-22 NOTE — Progress Notes (Signed)
Received a letter from [facility] gastroenterology that patient no showed her appointment.  Will address on follow-up visit.

## 2017-12-23 DIAGNOSIS — D509 Iron deficiency anemia, unspecified: Secondary | ICD-10-CM | POA: Diagnosis not present

## 2017-12-23 DIAGNOSIS — D631 Anemia in chronic kidney disease: Secondary | ICD-10-CM | POA: Diagnosis not present

## 2017-12-23 DIAGNOSIS — I129 Hypertensive chronic kidney disease with stage 1 through stage 4 chronic kidney disease, or unspecified chronic kidney disease: Secondary | ICD-10-CM | POA: Diagnosis not present

## 2017-12-23 DIAGNOSIS — N2581 Secondary hyperparathyroidism of renal origin: Secondary | ICD-10-CM | POA: Diagnosis not present

## 2017-12-23 DIAGNOSIS — Z992 Dependence on renal dialysis: Secondary | ICD-10-CM | POA: Diagnosis not present

## 2017-12-23 DIAGNOSIS — N186 End stage renal disease: Secondary | ICD-10-CM | POA: Diagnosis not present

## 2017-12-25 DIAGNOSIS — N2581 Secondary hyperparathyroidism of renal origin: Secondary | ICD-10-CM | POA: Diagnosis not present

## 2017-12-25 DIAGNOSIS — N186 End stage renal disease: Secondary | ICD-10-CM | POA: Diagnosis not present

## 2017-12-25 DIAGNOSIS — Z992 Dependence on renal dialysis: Secondary | ICD-10-CM | POA: Diagnosis not present

## 2017-12-25 DIAGNOSIS — D509 Iron deficiency anemia, unspecified: Secondary | ICD-10-CM | POA: Diagnosis not present

## 2017-12-25 DIAGNOSIS — D631 Anemia in chronic kidney disease: Secondary | ICD-10-CM | POA: Diagnosis not present

## 2017-12-28 DIAGNOSIS — N186 End stage renal disease: Secondary | ICD-10-CM | POA: Diagnosis not present

## 2017-12-28 DIAGNOSIS — N2581 Secondary hyperparathyroidism of renal origin: Secondary | ICD-10-CM | POA: Diagnosis not present

## 2017-12-28 DIAGNOSIS — D509 Iron deficiency anemia, unspecified: Secondary | ICD-10-CM | POA: Diagnosis not present

## 2017-12-28 DIAGNOSIS — Z992 Dependence on renal dialysis: Secondary | ICD-10-CM | POA: Diagnosis not present

## 2017-12-28 DIAGNOSIS — D631 Anemia in chronic kidney disease: Secondary | ICD-10-CM | POA: Diagnosis not present

## 2017-12-30 DIAGNOSIS — D631 Anemia in chronic kidney disease: Secondary | ICD-10-CM | POA: Diagnosis not present

## 2017-12-30 DIAGNOSIS — D509 Iron deficiency anemia, unspecified: Secondary | ICD-10-CM | POA: Diagnosis not present

## 2017-12-30 DIAGNOSIS — N2581 Secondary hyperparathyroidism of renal origin: Secondary | ICD-10-CM | POA: Diagnosis not present

## 2017-12-30 DIAGNOSIS — Z992 Dependence on renal dialysis: Secondary | ICD-10-CM | POA: Diagnosis not present

## 2017-12-30 DIAGNOSIS — N186 End stage renal disease: Secondary | ICD-10-CM | POA: Diagnosis not present

## 2018-01-01 DIAGNOSIS — D631 Anemia in chronic kidney disease: Secondary | ICD-10-CM | POA: Diagnosis not present

## 2018-01-01 DIAGNOSIS — N186 End stage renal disease: Secondary | ICD-10-CM | POA: Diagnosis not present

## 2018-01-01 DIAGNOSIS — N2581 Secondary hyperparathyroidism of renal origin: Secondary | ICD-10-CM | POA: Diagnosis not present

## 2018-01-01 DIAGNOSIS — Z992 Dependence on renal dialysis: Secondary | ICD-10-CM | POA: Diagnosis not present

## 2018-01-01 DIAGNOSIS — D509 Iron deficiency anemia, unspecified: Secondary | ICD-10-CM | POA: Diagnosis not present

## 2018-01-04 DIAGNOSIS — N186 End stage renal disease: Secondary | ICD-10-CM | POA: Diagnosis not present

## 2018-01-04 DIAGNOSIS — N2581 Secondary hyperparathyroidism of renal origin: Secondary | ICD-10-CM | POA: Diagnosis not present

## 2018-01-04 DIAGNOSIS — D631 Anemia in chronic kidney disease: Secondary | ICD-10-CM | POA: Diagnosis not present

## 2018-01-04 DIAGNOSIS — Z992 Dependence on renal dialysis: Secondary | ICD-10-CM | POA: Diagnosis not present

## 2018-01-04 DIAGNOSIS — D509 Iron deficiency anemia, unspecified: Secondary | ICD-10-CM | POA: Diagnosis not present

## 2018-01-06 DIAGNOSIS — D509 Iron deficiency anemia, unspecified: Secondary | ICD-10-CM | POA: Diagnosis not present

## 2018-01-06 DIAGNOSIS — N2581 Secondary hyperparathyroidism of renal origin: Secondary | ICD-10-CM | POA: Diagnosis not present

## 2018-01-06 DIAGNOSIS — D631 Anemia in chronic kidney disease: Secondary | ICD-10-CM | POA: Diagnosis not present

## 2018-01-06 DIAGNOSIS — N186 End stage renal disease: Secondary | ICD-10-CM | POA: Diagnosis not present

## 2018-01-06 DIAGNOSIS — Z992 Dependence on renal dialysis: Secondary | ICD-10-CM | POA: Diagnosis not present

## 2018-01-07 ENCOUNTER — Ambulatory Visit (INDEPENDENT_AMBULATORY_CARE_PROVIDER_SITE_OTHER): Payer: Medicare Other | Admitting: Family

## 2018-01-07 ENCOUNTER — Encounter: Payer: Self-pay | Admitting: Family

## 2018-01-07 VITALS — BP 119/82 | HR 79 | Temp 98.6°F | Resp 16 | Ht 67.0 in | Wt 163.1 lb

## 2018-01-07 DIAGNOSIS — N186 End stage renal disease: Secondary | ICD-10-CM | POA: Diagnosis not present

## 2018-01-07 DIAGNOSIS — E010 Iodine-deficiency related diffuse (endemic) goiter: Secondary | ICD-10-CM

## 2018-01-07 DIAGNOSIS — Z992 Dependence on renal dialysis: Secondary | ICD-10-CM

## 2018-01-07 NOTE — Progress Notes (Signed)
CC: pain at left upper arm access site x 2 weeks, only when accessed, AV graft, ESRD on hemodialysis   History of Present Illness  Melody [name] is a 42 y.o. (1976-02-06) female who is s/p new left upper arm AV Gore-Tex graft placement on 06-15-17 by Dr. Donnetta Hutching. She is also s/p left upper arm AV graft placed by Dr. Oneida Alar on 12/30/2016.  Has had multiple thromboses of this.  She did have a prior right radiocephalic fistula which did not mature.   She reports that she is having good dialysis currently from her left upper arm AV graft She is being evaluated for potential transplant.   She states she remains tobacco free since January 2018, states her kidney function failed due to hypertension.  The last time pt was seen in our office was by Dr. Donnetta Hutching on 06-09-17 just prior to placement of new left arm AV graft.  Pt returns today, referred by Dr. Marval Regal, with c/o pain in left upper arm AV graft in the last couple of weeks only when accessed; pt states she has to be stuck several times, about a couple of weeks ago her left upper arm had some swelling after the AV graft was accessed, and the pain with access occurred since then. She denies tingling, numbness, or pain in left upper extremity.  She denies fever or chills.   She has no history of thyromegaly, but her thyroid is mildly enlarged. She denies dysphagia, denies dyspnea.  She denies feeling heart pounding or palpitations, denies feeling fatigued, denies feeling too hot or too cold, denies loose stools, denies constipation.    Past Medical History:  Diagnosis Date  . Alcohol abuse   . Chronic kidney disease    staqge 3  . Drug abuse ([place])   . Ectopic pregnancy   . Headache    Migraines  . Hypertension Dx May 2016  . Tobacco abuse     Social History Social History   Tobacco Use  . Smoking status: Former Smoker    Packs/day: 0.00    Years: 0.00    Pack years: 0.00    Types: Cigars, Cigarettes    Start date:  09/11/2016  . Smokeless tobacco: Never Used  Substance Use Topics  . Alcohol use: Yes    Alcohol/week: 0.0 oz    Comment: social  . Drug use: Yes    Types: Marijuana    Family History Family History  Problem Relation Age of Onset  . CAD Mother   . Hypertension Mother   . Heart disease Mother   . Kidney failure Mother   . CAD Brother   . Hypertension Other   . Kidney failure Other   . Hypertension Maternal Grandmother   . Kidney failure Maternal Grandmother   . Congestive Heart Failure Maternal Grandmother     Surgical History Past Surgical History:  Procedure Laterality Date  . AV FISTULA PLACEMENT Right 09/15/2016   Procedure: ARTERIOVENOUS (AV) FISTULA CREATION UPPER RIGHT ARM;  Surgeon: Rosetta Posner, MD;  Location: [facility] OR;  Service: Vascular;  Laterality: Right;  . AV FISTULA PLACEMENT Left 12/30/2016   Procedure: ARTERIOVENOUS (AV) FISTULA CREATION USING GORETEX STRETCH GRAFT;  Surgeon: Elam Dutch, MD;  Location: [facility] OR;  Service: Vascular;  Laterality: Left;  . AV FISTULA PLACEMENT Left 06/15/2017   Procedure: INSERTION OF ARTERIOVENOUS (AV) GORE-TEX GRAFT 6MM X 40CM LEFT ARM;  Surgeon: Rosetta Posner, MD;  Location: [place];  Service: Vascular;  Laterality:  Left;  . DILATION AND CURETTAGE OF UTERUS    . INSERTION OF DIALYSIS CATHETER Right 09/15/2016   Procedure: INSERTION OF DIALYSIS CATHETER ON RIGHT SIDE;  Surgeon: Rosetta Posner, MD;  Location: [place];  Service: Vascular;  Laterality: Right;    Allergies  Allergen Reactions  . Acetaminophen     Tylenol #3 Causes nightmares  . Codeine Other (See Comments)  . Tramadol Hives    Current Outpatient Medications  Medication Sig Dispense Refill  . acetaminophen (TYLENOL) 500 MG tablet Take 1,000 mg by mouth every 6 (six) hours as needed for mild pain or headache.    Marland Kitchen amLODipine (NORVASC) 5 MG tablet Take 1 tablet by mouth at bedtime.   0  . B Complex-C-Folic Acid (RENA-VITE RX) 1 MG TABS Take by mouth.    . calcium  acetate (PHOSLO) 667 MG capsule Take 2 capsules (1,334 mg total) by mouth 3 (three) times daily with meals. 180 capsule 0  . labetalol (NORMODYNE) 300 MG tablet Take 1 tablet (300 mg total) by mouth 2 (two) times daily. 180 tablet 3  . lidocaine-prilocaine (EMLA) cream Apply 1 application topically daily as needed for irritation.  0  . lisinopril (PRINIVIL,ZESTRIL) 30 MG tablet Take 1 tablet (30 mg total) by mouth at bedtime. 90 tablet 3  . multivitamin (RENA-VIT) TABS tablet Take 1 tablet by mouth daily.  0   No current facility-administered medications for this visit.      REVIEW OF SYSTEMS: see HPI for pertinent positives and negatives    PHYSICAL EXAMINATION:  Vitals:   01/07/18 1343  BP: 119/82  Pulse: 79  Resp: 16  Temp: 98.6 F (37 C)  TempSrc: Oral  SpO2: 99%  Weight: 163 lb 1.6 oz (74 kg)  Height: 5\' 7"  (1.702 m)   Body mass index is 25.55 kg/m.  General: The patient appears her stated age.   HEENT:  Thyromegaly, right lobe about 1.5 x normal size, left lobe about 1.3 x normal size Pulmonary: Respirations are non-labored, CTAB Abdomen: Soft and non-tender with normal bowel sounds. Musculoskeletal: There are no major deformities.   Neurologic: No focal weakness or paresthesias are detected. Hand grasp strength is 5/5 bilaterally, normal sensation in both hands Skin: There are no ulcer or rashes noted.  Psychiatric: The patient has normal affect. Cardiovascular: There is a regular rate and rhythm without significant murmur appreciated.  Left upper arm AV graft with audible bruit, no palpable thrill. Bilateral radial pulses are 2+ palpable.  No swelling at left upper arm. Pedal pulses are palpable.    Medical Decision Making  Melody [name] is a 42 y.o. female who is s/p new left upper arm AV Gore-Tex graft placement on 06-15-17 by Dr. Donnetta Hutching. She is also s/p left upper arm AV graft placed by Dr. Oneida Alar on 12/30/2016.  Has had multiple thromboses of this.  She  did have a prior right radiocephalic fistula which did not mature.   Pt returns today, referred by Dr. Marval Regal, with c/o pain in left upper arm AV graft in the last couple of weeks only when accessed; pt states she has to be stuck several times, about a couple of weeks ago her left upper arm had some swelling after the AV graft was accessed, and the pain with access occurred since then. She denies tingling, numbness, or pain in left upper extremity.  She denies fever or chills.    I discussed with Dr. Donnetta Hutching pt HPI and physical exam results. She  has had pain in the upper accessed site since it seems like there was some extravasation with access a couple of weeks ago, no pain otherwise. No fever or chills.  I reassured pt that the AV graft is working well, no signs or symptoms of infection, and hopefully the pain will lessen, and hopefully this graft will last long enough until she receives a kidney transplant.   Pt has mild thyromegaly, right lobe larger than left, no hyper or hypothyroid sx's. I advised her to discuss this with her PCP.   Follow up with [place] as needed.    Clemon Chambers, RN, MSN, FNP-C [facility] Office: [phone]  01/07/2018, 7:16 PM  Clinic MD: Oneida Alar

## 2018-01-08 DIAGNOSIS — Z992 Dependence on renal dialysis: Secondary | ICD-10-CM | POA: Diagnosis not present

## 2018-01-08 DIAGNOSIS — N186 End stage renal disease: Secondary | ICD-10-CM | POA: Diagnosis not present

## 2018-01-08 DIAGNOSIS — D509 Iron deficiency anemia, unspecified: Secondary | ICD-10-CM | POA: Diagnosis not present

## 2018-01-08 DIAGNOSIS — D631 Anemia in chronic kidney disease: Secondary | ICD-10-CM | POA: Diagnosis not present

## 2018-01-08 DIAGNOSIS — N2581 Secondary hyperparathyroidism of renal origin: Secondary | ICD-10-CM | POA: Diagnosis not present

## 2018-01-11 DIAGNOSIS — N186 End stage renal disease: Secondary | ICD-10-CM | POA: Diagnosis not present

## 2018-01-11 DIAGNOSIS — D631 Anemia in chronic kidney disease: Secondary | ICD-10-CM | POA: Diagnosis not present

## 2018-01-11 DIAGNOSIS — N2581 Secondary hyperparathyroidism of renal origin: Secondary | ICD-10-CM | POA: Diagnosis not present

## 2018-01-11 DIAGNOSIS — Z992 Dependence on renal dialysis: Secondary | ICD-10-CM | POA: Diagnosis not present

## 2018-01-11 DIAGNOSIS — D509 Iron deficiency anemia, unspecified: Secondary | ICD-10-CM | POA: Diagnosis not present

## 2018-01-13 DIAGNOSIS — D509 Iron deficiency anemia, unspecified: Secondary | ICD-10-CM | POA: Diagnosis not present

## 2018-01-13 DIAGNOSIS — N186 End stage renal disease: Secondary | ICD-10-CM | POA: Diagnosis not present

## 2018-01-13 DIAGNOSIS — Z992 Dependence on renal dialysis: Secondary | ICD-10-CM | POA: Diagnosis not present

## 2018-01-13 DIAGNOSIS — D631 Anemia in chronic kidney disease: Secondary | ICD-10-CM | POA: Diagnosis not present

## 2018-01-13 DIAGNOSIS — N2581 Secondary hyperparathyroidism of renal origin: Secondary | ICD-10-CM | POA: Diagnosis not present

## 2018-01-15 DIAGNOSIS — N179 Acute kidney failure, unspecified: Secondary | ICD-10-CM | POA: Diagnosis not present

## 2018-01-18 DIAGNOSIS — N179 Acute kidney failure, unspecified: Secondary | ICD-10-CM | POA: Diagnosis not present

## 2018-01-20 DIAGNOSIS — D631 Anemia in chronic kidney disease: Secondary | ICD-10-CM | POA: Diagnosis not present

## 2018-01-20 DIAGNOSIS — Z992 Dependence on renal dialysis: Secondary | ICD-10-CM | POA: Diagnosis not present

## 2018-01-20 DIAGNOSIS — N186 End stage renal disease: Secondary | ICD-10-CM | POA: Diagnosis not present

## 2018-01-20 DIAGNOSIS — D509 Iron deficiency anemia, unspecified: Secondary | ICD-10-CM | POA: Diagnosis not present

## 2018-01-20 DIAGNOSIS — N2581 Secondary hyperparathyroidism of renal origin: Secondary | ICD-10-CM | POA: Diagnosis not present

## 2018-01-22 DIAGNOSIS — D631 Anemia in chronic kidney disease: Secondary | ICD-10-CM | POA: Diagnosis not present

## 2018-01-22 DIAGNOSIS — N2581 Secondary hyperparathyroidism of renal origin: Secondary | ICD-10-CM | POA: Diagnosis not present

## 2018-01-22 DIAGNOSIS — N186 End stage renal disease: Secondary | ICD-10-CM | POA: Diagnosis not present

## 2018-01-22 DIAGNOSIS — D509 Iron deficiency anemia, unspecified: Secondary | ICD-10-CM | POA: Diagnosis not present

## 2018-01-22 DIAGNOSIS — Z992 Dependence on renal dialysis: Secondary | ICD-10-CM | POA: Diagnosis not present

## 2018-01-23 DIAGNOSIS — Z992 Dependence on renal dialysis: Secondary | ICD-10-CM | POA: Diagnosis not present

## 2018-01-23 DIAGNOSIS — I129 Hypertensive chronic kidney disease with stage 1 through stage 4 chronic kidney disease, or unspecified chronic kidney disease: Secondary | ICD-10-CM | POA: Diagnosis not present

## 2018-01-23 DIAGNOSIS — N186 End stage renal disease: Secondary | ICD-10-CM | POA: Diagnosis not present

## 2018-01-25 DIAGNOSIS — D631 Anemia in chronic kidney disease: Secondary | ICD-10-CM | POA: Diagnosis not present

## 2018-01-25 DIAGNOSIS — N2581 Secondary hyperparathyroidism of renal origin: Secondary | ICD-10-CM | POA: Diagnosis not present

## 2018-01-25 DIAGNOSIS — D509 Iron deficiency anemia, unspecified: Secondary | ICD-10-CM | POA: Diagnosis not present

## 2018-01-25 DIAGNOSIS — N186 End stage renal disease: Secondary | ICD-10-CM | POA: Diagnosis not present

## 2018-01-27 DIAGNOSIS — N186 End stage renal disease: Secondary | ICD-10-CM | POA: Diagnosis not present

## 2018-01-27 DIAGNOSIS — D631 Anemia in chronic kidney disease: Secondary | ICD-10-CM | POA: Diagnosis not present

## 2018-01-27 DIAGNOSIS — D509 Iron deficiency anemia, unspecified: Secondary | ICD-10-CM | POA: Diagnosis not present

## 2018-01-27 DIAGNOSIS — N2581 Secondary hyperparathyroidism of renal origin: Secondary | ICD-10-CM | POA: Diagnosis not present

## 2018-01-29 DIAGNOSIS — D509 Iron deficiency anemia, unspecified: Secondary | ICD-10-CM | POA: Diagnosis not present

## 2018-01-29 DIAGNOSIS — N2581 Secondary hyperparathyroidism of renal origin: Secondary | ICD-10-CM | POA: Diagnosis not present

## 2018-01-29 DIAGNOSIS — N186 End stage renal disease: Secondary | ICD-10-CM | POA: Diagnosis not present

## 2018-01-29 DIAGNOSIS — D631 Anemia in chronic kidney disease: Secondary | ICD-10-CM | POA: Diagnosis not present

## 2018-02-01 DIAGNOSIS — N2581 Secondary hyperparathyroidism of renal origin: Secondary | ICD-10-CM | POA: Diagnosis not present

## 2018-02-01 DIAGNOSIS — D509 Iron deficiency anemia, unspecified: Secondary | ICD-10-CM | POA: Diagnosis not present

## 2018-02-01 DIAGNOSIS — N186 End stage renal disease: Secondary | ICD-10-CM | POA: Diagnosis not present

## 2018-02-01 DIAGNOSIS — D631 Anemia in chronic kidney disease: Secondary | ICD-10-CM | POA: Diagnosis not present

## 2018-02-03 DIAGNOSIS — N186 End stage renal disease: Secondary | ICD-10-CM | POA: Diagnosis not present

## 2018-02-03 DIAGNOSIS — D509 Iron deficiency anemia, unspecified: Secondary | ICD-10-CM | POA: Diagnosis not present

## 2018-02-03 DIAGNOSIS — D631 Anemia in chronic kidney disease: Secondary | ICD-10-CM | POA: Diagnosis not present

## 2018-02-03 DIAGNOSIS — N2581 Secondary hyperparathyroidism of renal origin: Secondary | ICD-10-CM | POA: Diagnosis not present

## 2018-02-05 DIAGNOSIS — D509 Iron deficiency anemia, unspecified: Secondary | ICD-10-CM | POA: Diagnosis not present

## 2018-02-05 DIAGNOSIS — N186 End stage renal disease: Secondary | ICD-10-CM | POA: Diagnosis not present

## 2018-02-05 DIAGNOSIS — N2581 Secondary hyperparathyroidism of renal origin: Secondary | ICD-10-CM | POA: Diagnosis not present

## 2018-02-05 DIAGNOSIS — D631 Anemia in chronic kidney disease: Secondary | ICD-10-CM | POA: Diagnosis not present

## 2018-02-08 DIAGNOSIS — D509 Iron deficiency anemia, unspecified: Secondary | ICD-10-CM | POA: Diagnosis not present

## 2018-02-08 DIAGNOSIS — N2581 Secondary hyperparathyroidism of renal origin: Secondary | ICD-10-CM | POA: Diagnosis not present

## 2018-02-08 DIAGNOSIS — N186 End stage renal disease: Secondary | ICD-10-CM | POA: Diagnosis not present

## 2018-02-08 DIAGNOSIS — D631 Anemia in chronic kidney disease: Secondary | ICD-10-CM | POA: Diagnosis not present

## 2018-02-09 ENCOUNTER — Encounter: Payer: Self-pay | Admitting: Gastroenterology

## 2018-02-10 DIAGNOSIS — D631 Anemia in chronic kidney disease: Secondary | ICD-10-CM | POA: Diagnosis not present

## 2018-02-10 DIAGNOSIS — N186 End stage renal disease: Secondary | ICD-10-CM | POA: Diagnosis not present

## 2018-02-10 DIAGNOSIS — D509 Iron deficiency anemia, unspecified: Secondary | ICD-10-CM | POA: Diagnosis not present

## 2018-02-10 DIAGNOSIS — N2581 Secondary hyperparathyroidism of renal origin: Secondary | ICD-10-CM | POA: Diagnosis not present

## 2018-02-12 DIAGNOSIS — N2581 Secondary hyperparathyroidism of renal origin: Secondary | ICD-10-CM | POA: Diagnosis not present

## 2018-02-12 DIAGNOSIS — D631 Anemia in chronic kidney disease: Secondary | ICD-10-CM | POA: Diagnosis not present

## 2018-02-12 DIAGNOSIS — N186 End stage renal disease: Secondary | ICD-10-CM | POA: Diagnosis not present

## 2018-02-12 DIAGNOSIS — D509 Iron deficiency anemia, unspecified: Secondary | ICD-10-CM | POA: Diagnosis not present

## 2018-02-15 DIAGNOSIS — N186 End stage renal disease: Secondary | ICD-10-CM | POA: Diagnosis not present

## 2018-02-15 DIAGNOSIS — N2581 Secondary hyperparathyroidism of renal origin: Secondary | ICD-10-CM | POA: Diagnosis not present

## 2018-02-15 DIAGNOSIS — D631 Anemia in chronic kidney disease: Secondary | ICD-10-CM | POA: Diagnosis not present

## 2018-02-15 DIAGNOSIS — D509 Iron deficiency anemia, unspecified: Secondary | ICD-10-CM | POA: Diagnosis not present

## 2018-02-16 DIAGNOSIS — N186 End stage renal disease: Secondary | ICD-10-CM | POA: Diagnosis not present

## 2018-02-16 DIAGNOSIS — T82858A Stenosis of vascular prosthetic devices, implants and grafts, initial encounter: Secondary | ICD-10-CM | POA: Diagnosis not present

## 2018-02-16 DIAGNOSIS — Z992 Dependence on renal dialysis: Secondary | ICD-10-CM | POA: Diagnosis not present

## 2018-02-16 DIAGNOSIS — I871 Compression of vein: Secondary | ICD-10-CM | POA: Diagnosis not present

## 2018-02-17 DIAGNOSIS — D631 Anemia in chronic kidney disease: Secondary | ICD-10-CM | POA: Diagnosis not present

## 2018-02-17 DIAGNOSIS — N2581 Secondary hyperparathyroidism of renal origin: Secondary | ICD-10-CM | POA: Diagnosis not present

## 2018-02-17 DIAGNOSIS — D509 Iron deficiency anemia, unspecified: Secondary | ICD-10-CM | POA: Diagnosis not present

## 2018-02-17 DIAGNOSIS — N186 End stage renal disease: Secondary | ICD-10-CM | POA: Diagnosis not present

## 2018-02-19 DIAGNOSIS — D631 Anemia in chronic kidney disease: Secondary | ICD-10-CM | POA: Diagnosis not present

## 2018-02-19 DIAGNOSIS — N2581 Secondary hyperparathyroidism of renal origin: Secondary | ICD-10-CM | POA: Diagnosis not present

## 2018-02-19 DIAGNOSIS — D509 Iron deficiency anemia, unspecified: Secondary | ICD-10-CM | POA: Diagnosis not present

## 2018-02-19 DIAGNOSIS — N186 End stage renal disease: Secondary | ICD-10-CM | POA: Diagnosis not present

## 2018-02-22 DIAGNOSIS — N186 End stage renal disease: Secondary | ICD-10-CM | POA: Diagnosis not present

## 2018-02-22 DIAGNOSIS — N2581 Secondary hyperparathyroidism of renal origin: Secondary | ICD-10-CM | POA: Diagnosis not present

## 2018-02-22 DIAGNOSIS — D509 Iron deficiency anemia, unspecified: Secondary | ICD-10-CM | POA: Diagnosis not present

## 2018-02-22 DIAGNOSIS — Z992 Dependence on renal dialysis: Secondary | ICD-10-CM | POA: Diagnosis not present

## 2018-02-22 DIAGNOSIS — I129 Hypertensive chronic kidney disease with stage 1 through stage 4 chronic kidney disease, or unspecified chronic kidney disease: Secondary | ICD-10-CM | POA: Diagnosis not present

## 2018-02-22 DIAGNOSIS — D631 Anemia in chronic kidney disease: Secondary | ICD-10-CM | POA: Diagnosis not present

## 2018-02-24 DIAGNOSIS — N186 End stage renal disease: Secondary | ICD-10-CM | POA: Diagnosis not present

## 2018-02-24 DIAGNOSIS — D509 Iron deficiency anemia, unspecified: Secondary | ICD-10-CM | POA: Diagnosis not present

## 2018-02-24 DIAGNOSIS — N2581 Secondary hyperparathyroidism of renal origin: Secondary | ICD-10-CM | POA: Diagnosis not present

## 2018-02-24 DIAGNOSIS — D631 Anemia in chronic kidney disease: Secondary | ICD-10-CM | POA: Diagnosis not present

## 2018-02-26 DIAGNOSIS — D631 Anemia in chronic kidney disease: Secondary | ICD-10-CM | POA: Diagnosis not present

## 2018-02-26 DIAGNOSIS — N186 End stage renal disease: Secondary | ICD-10-CM | POA: Diagnosis not present

## 2018-02-26 DIAGNOSIS — N2581 Secondary hyperparathyroidism of renal origin: Secondary | ICD-10-CM | POA: Diagnosis not present

## 2018-02-26 DIAGNOSIS — D509 Iron deficiency anemia, unspecified: Secondary | ICD-10-CM | POA: Diagnosis not present

## 2018-03-01 ENCOUNTER — Ambulatory Visit: Payer: Medicare Other | Admitting: Internal Medicine

## 2018-03-01 DIAGNOSIS — N186 End stage renal disease: Secondary | ICD-10-CM | POA: Diagnosis not present

## 2018-03-01 DIAGNOSIS — D509 Iron deficiency anemia, unspecified: Secondary | ICD-10-CM | POA: Diagnosis not present

## 2018-03-01 DIAGNOSIS — N2581 Secondary hyperparathyroidism of renal origin: Secondary | ICD-10-CM | POA: Diagnosis not present

## 2018-03-01 DIAGNOSIS — D631 Anemia in chronic kidney disease: Secondary | ICD-10-CM | POA: Diagnosis not present

## 2018-03-03 DIAGNOSIS — N2581 Secondary hyperparathyroidism of renal origin: Secondary | ICD-10-CM | POA: Diagnosis not present

## 2018-03-03 DIAGNOSIS — D509 Iron deficiency anemia, unspecified: Secondary | ICD-10-CM | POA: Diagnosis not present

## 2018-03-03 DIAGNOSIS — D631 Anemia in chronic kidney disease: Secondary | ICD-10-CM | POA: Diagnosis not present

## 2018-03-03 DIAGNOSIS — N186 End stage renal disease: Secondary | ICD-10-CM | POA: Diagnosis not present

## 2018-03-05 DIAGNOSIS — D509 Iron deficiency anemia, unspecified: Secondary | ICD-10-CM | POA: Diagnosis not present

## 2018-03-05 DIAGNOSIS — D631 Anemia in chronic kidney disease: Secondary | ICD-10-CM | POA: Diagnosis not present

## 2018-03-05 DIAGNOSIS — N186 End stage renal disease: Secondary | ICD-10-CM | POA: Diagnosis not present

## 2018-03-05 DIAGNOSIS — N2581 Secondary hyperparathyroidism of renal origin: Secondary | ICD-10-CM | POA: Diagnosis not present

## 2018-03-08 DIAGNOSIS — D631 Anemia in chronic kidney disease: Secondary | ICD-10-CM | POA: Diagnosis not present

## 2018-03-08 DIAGNOSIS — N2581 Secondary hyperparathyroidism of renal origin: Secondary | ICD-10-CM | POA: Diagnosis not present

## 2018-03-08 DIAGNOSIS — N186 End stage renal disease: Secondary | ICD-10-CM | POA: Diagnosis not present

## 2018-03-08 DIAGNOSIS — D509 Iron deficiency anemia, unspecified: Secondary | ICD-10-CM | POA: Diagnosis not present

## 2018-03-10 DIAGNOSIS — N2581 Secondary hyperparathyroidism of renal origin: Secondary | ICD-10-CM | POA: Diagnosis not present

## 2018-03-10 DIAGNOSIS — D631 Anemia in chronic kidney disease: Secondary | ICD-10-CM | POA: Diagnosis not present

## 2018-03-10 DIAGNOSIS — N186 End stage renal disease: Secondary | ICD-10-CM | POA: Diagnosis not present

## 2018-03-10 DIAGNOSIS — D509 Iron deficiency anemia, unspecified: Secondary | ICD-10-CM | POA: Diagnosis not present

## 2018-03-11 DIAGNOSIS — N2581 Secondary hyperparathyroidism of renal origin: Secondary | ICD-10-CM | POA: Diagnosis not present

## 2018-03-11 DIAGNOSIS — D509 Iron deficiency anemia, unspecified: Secondary | ICD-10-CM | POA: Diagnosis not present

## 2018-03-11 DIAGNOSIS — N186 End stage renal disease: Secondary | ICD-10-CM | POA: Diagnosis not present

## 2018-03-11 DIAGNOSIS — D631 Anemia in chronic kidney disease: Secondary | ICD-10-CM | POA: Diagnosis not present

## 2018-03-15 DIAGNOSIS — N186 End stage renal disease: Secondary | ICD-10-CM | POA: Diagnosis not present

## 2018-03-15 DIAGNOSIS — D631 Anemia in chronic kidney disease: Secondary | ICD-10-CM | POA: Diagnosis not present

## 2018-03-15 DIAGNOSIS — D509 Iron deficiency anemia, unspecified: Secondary | ICD-10-CM | POA: Diagnosis not present

## 2018-03-15 DIAGNOSIS — N2581 Secondary hyperparathyroidism of renal origin: Secondary | ICD-10-CM | POA: Diagnosis not present

## 2018-03-17 DIAGNOSIS — D631 Anemia in chronic kidney disease: Secondary | ICD-10-CM | POA: Diagnosis not present

## 2018-03-17 DIAGNOSIS — N2581 Secondary hyperparathyroidism of renal origin: Secondary | ICD-10-CM | POA: Diagnosis not present

## 2018-03-17 DIAGNOSIS — D509 Iron deficiency anemia, unspecified: Secondary | ICD-10-CM | POA: Diagnosis not present

## 2018-03-17 DIAGNOSIS — N186 End stage renal disease: Secondary | ICD-10-CM | POA: Diagnosis not present

## 2018-03-19 DIAGNOSIS — N186 End stage renal disease: Secondary | ICD-10-CM | POA: Diagnosis not present

## 2018-03-19 DIAGNOSIS — N2581 Secondary hyperparathyroidism of renal origin: Secondary | ICD-10-CM | POA: Diagnosis not present

## 2018-03-19 DIAGNOSIS — D631 Anemia in chronic kidney disease: Secondary | ICD-10-CM | POA: Diagnosis not present

## 2018-03-19 DIAGNOSIS — D509 Iron deficiency anemia, unspecified: Secondary | ICD-10-CM | POA: Diagnosis not present

## 2018-03-22 DIAGNOSIS — N2581 Secondary hyperparathyroidism of renal origin: Secondary | ICD-10-CM | POA: Diagnosis not present

## 2018-03-22 DIAGNOSIS — N186 End stage renal disease: Secondary | ICD-10-CM | POA: Diagnosis not present

## 2018-03-22 DIAGNOSIS — D509 Iron deficiency anemia, unspecified: Secondary | ICD-10-CM | POA: Diagnosis not present

## 2018-03-22 DIAGNOSIS — D631 Anemia in chronic kidney disease: Secondary | ICD-10-CM | POA: Diagnosis not present

## 2018-03-24 DIAGNOSIS — D631 Anemia in chronic kidney disease: Secondary | ICD-10-CM | POA: Diagnosis not present

## 2018-03-24 DIAGNOSIS — N2581 Secondary hyperparathyroidism of renal origin: Secondary | ICD-10-CM | POA: Diagnosis not present

## 2018-03-24 DIAGNOSIS — D509 Iron deficiency anemia, unspecified: Secondary | ICD-10-CM | POA: Diagnosis not present

## 2018-03-24 DIAGNOSIS — N186 End stage renal disease: Secondary | ICD-10-CM | POA: Diagnosis not present

## 2018-03-25 DIAGNOSIS — N186 End stage renal disease: Secondary | ICD-10-CM | POA: Diagnosis not present

## 2018-03-25 DIAGNOSIS — Z992 Dependence on renal dialysis: Secondary | ICD-10-CM | POA: Diagnosis not present

## 2018-03-25 DIAGNOSIS — I129 Hypertensive chronic kidney disease with stage 1 through stage 4 chronic kidney disease, or unspecified chronic kidney disease: Secondary | ICD-10-CM | POA: Diagnosis not present

## 2018-03-26 DIAGNOSIS — N2581 Secondary hyperparathyroidism of renal origin: Secondary | ICD-10-CM | POA: Diagnosis not present

## 2018-03-26 DIAGNOSIS — D509 Iron deficiency anemia, unspecified: Secondary | ICD-10-CM | POA: Diagnosis not present

## 2018-03-26 DIAGNOSIS — D631 Anemia in chronic kidney disease: Secondary | ICD-10-CM | POA: Diagnosis not present

## 2018-03-26 DIAGNOSIS — N186 End stage renal disease: Secondary | ICD-10-CM | POA: Diagnosis not present

## 2018-03-29 DIAGNOSIS — D509 Iron deficiency anemia, unspecified: Secondary | ICD-10-CM | POA: Diagnosis not present

## 2018-03-29 DIAGNOSIS — N2581 Secondary hyperparathyroidism of renal origin: Secondary | ICD-10-CM | POA: Diagnosis not present

## 2018-03-29 DIAGNOSIS — D631 Anemia in chronic kidney disease: Secondary | ICD-10-CM | POA: Diagnosis not present

## 2018-03-29 DIAGNOSIS — N186 End stage renal disease: Secondary | ICD-10-CM | POA: Diagnosis not present

## 2018-03-31 DIAGNOSIS — N2581 Secondary hyperparathyroidism of renal origin: Secondary | ICD-10-CM | POA: Diagnosis not present

## 2018-03-31 DIAGNOSIS — N186 End stage renal disease: Secondary | ICD-10-CM | POA: Diagnosis not present

## 2018-03-31 DIAGNOSIS — D631 Anemia in chronic kidney disease: Secondary | ICD-10-CM | POA: Diagnosis not present

## 2018-03-31 DIAGNOSIS — D509 Iron deficiency anemia, unspecified: Secondary | ICD-10-CM | POA: Diagnosis not present

## 2018-04-02 DIAGNOSIS — D509 Iron deficiency anemia, unspecified: Secondary | ICD-10-CM | POA: Diagnosis not present

## 2018-04-02 DIAGNOSIS — N2581 Secondary hyperparathyroidism of renal origin: Secondary | ICD-10-CM | POA: Diagnosis not present

## 2018-04-02 DIAGNOSIS — D631 Anemia in chronic kidney disease: Secondary | ICD-10-CM | POA: Diagnosis not present

## 2018-04-02 DIAGNOSIS — N186 End stage renal disease: Secondary | ICD-10-CM | POA: Diagnosis not present

## 2018-04-05 DIAGNOSIS — N2581 Secondary hyperparathyroidism of renal origin: Secondary | ICD-10-CM | POA: Diagnosis not present

## 2018-04-05 DIAGNOSIS — N186 End stage renal disease: Secondary | ICD-10-CM | POA: Diagnosis not present

## 2018-04-05 DIAGNOSIS — D631 Anemia in chronic kidney disease: Secondary | ICD-10-CM | POA: Diagnosis not present

## 2018-04-05 DIAGNOSIS — D509 Iron deficiency anemia, unspecified: Secondary | ICD-10-CM | POA: Diagnosis not present

## 2018-04-07 DIAGNOSIS — N186 End stage renal disease: Secondary | ICD-10-CM | POA: Diagnosis not present

## 2018-04-07 DIAGNOSIS — N2581 Secondary hyperparathyroidism of renal origin: Secondary | ICD-10-CM | POA: Diagnosis not present

## 2018-04-07 DIAGNOSIS — D509 Iron deficiency anemia, unspecified: Secondary | ICD-10-CM | POA: Diagnosis not present

## 2018-04-07 DIAGNOSIS — D631 Anemia in chronic kidney disease: Secondary | ICD-10-CM | POA: Diagnosis not present

## 2018-04-09 DIAGNOSIS — N186 End stage renal disease: Secondary | ICD-10-CM | POA: Diagnosis not present

## 2018-04-09 DIAGNOSIS — N2581 Secondary hyperparathyroidism of renal origin: Secondary | ICD-10-CM | POA: Diagnosis not present

## 2018-04-09 DIAGNOSIS — D509 Iron deficiency anemia, unspecified: Secondary | ICD-10-CM | POA: Diagnosis not present

## 2018-04-09 DIAGNOSIS — D631 Anemia in chronic kidney disease: Secondary | ICD-10-CM | POA: Diagnosis not present

## 2018-04-12 DIAGNOSIS — D509 Iron deficiency anemia, unspecified: Secondary | ICD-10-CM | POA: Diagnosis not present

## 2018-04-12 DIAGNOSIS — N2581 Secondary hyperparathyroidism of renal origin: Secondary | ICD-10-CM | POA: Diagnosis not present

## 2018-04-12 DIAGNOSIS — N186 End stage renal disease: Secondary | ICD-10-CM | POA: Diagnosis not present

## 2018-04-12 DIAGNOSIS — D631 Anemia in chronic kidney disease: Secondary | ICD-10-CM | POA: Diagnosis not present

## 2018-04-14 DIAGNOSIS — D631 Anemia in chronic kidney disease: Secondary | ICD-10-CM | POA: Diagnosis not present

## 2018-04-14 DIAGNOSIS — N2581 Secondary hyperparathyroidism of renal origin: Secondary | ICD-10-CM | POA: Diagnosis not present

## 2018-04-14 DIAGNOSIS — D509 Iron deficiency anemia, unspecified: Secondary | ICD-10-CM | POA: Diagnosis not present

## 2018-04-14 DIAGNOSIS — N186 End stage renal disease: Secondary | ICD-10-CM | POA: Diagnosis not present

## 2018-04-15 DIAGNOSIS — N186 End stage renal disease: Secondary | ICD-10-CM | POA: Diagnosis not present

## 2018-04-15 DIAGNOSIS — Z992 Dependence on renal dialysis: Secondary | ICD-10-CM | POA: Diagnosis not present

## 2018-04-15 DIAGNOSIS — I871 Compression of vein: Secondary | ICD-10-CM | POA: Diagnosis not present

## 2018-04-15 DIAGNOSIS — T82858A Stenosis of vascular prosthetic devices, implants and grafts, initial encounter: Secondary | ICD-10-CM | POA: Diagnosis not present

## 2018-04-16 DIAGNOSIS — D509 Iron deficiency anemia, unspecified: Secondary | ICD-10-CM | POA: Diagnosis not present

## 2018-04-16 DIAGNOSIS — D631 Anemia in chronic kidney disease: Secondary | ICD-10-CM | POA: Diagnosis not present

## 2018-04-16 DIAGNOSIS — N186 End stage renal disease: Secondary | ICD-10-CM | POA: Diagnosis not present

## 2018-04-16 DIAGNOSIS — N2581 Secondary hyperparathyroidism of renal origin: Secondary | ICD-10-CM | POA: Diagnosis not present

## 2018-04-18 ENCOUNTER — Encounter (HOSPITAL_COMMUNITY): Payer: Self-pay | Admitting: Emergency Medicine

## 2018-04-18 ENCOUNTER — Emergency Department (HOSPITAL_COMMUNITY)
Admission: EM | Admit: 2018-04-18 | Discharge: 2018-04-19 | Disposition: A | Discharge status: Home / Self Care | Payer: Medicare Other | Attending: Emergency Medicine | Admitting: Emergency Medicine

## 2018-04-18 ENCOUNTER — Emergency Department (HOSPITAL_COMMUNITY): Payer: Medicare Other

## 2018-04-18 ENCOUNTER — Other Ambulatory Visit: Payer: Self-pay

## 2018-04-18 DIAGNOSIS — N186 End stage renal disease: Secondary | ICD-10-CM | POA: Insufficient documentation

## 2018-04-18 DIAGNOSIS — Z79899 Other long term (current) drug therapy: Secondary | ICD-10-CM | POA: Diagnosis not present

## 2018-04-18 DIAGNOSIS — R1012 Left upper quadrant pain: Secondary | ICD-10-CM | POA: Diagnosis not present

## 2018-04-18 DIAGNOSIS — M5412 Radiculopathy, cervical region: Secondary | ICD-10-CM | POA: Insufficient documentation

## 2018-04-18 DIAGNOSIS — Z87891 Personal history of nicotine dependence: Secondary | ICD-10-CM | POA: Insufficient documentation

## 2018-04-18 DIAGNOSIS — I120 Hypertensive chronic kidney disease with stage 5 chronic kidney disease or end stage renal disease: Secondary | ICD-10-CM | POA: Insufficient documentation

## 2018-04-18 DIAGNOSIS — R1010 Upper abdominal pain, unspecified: Secondary | ICD-10-CM

## 2018-04-18 DIAGNOSIS — R1013 Epigastric pain: Secondary | ICD-10-CM | POA: Insufficient documentation

## 2018-04-18 DIAGNOSIS — M542 Cervicalgia: Secondary | ICD-10-CM | POA: Diagnosis present

## 2018-04-18 DIAGNOSIS — R109 Unspecified abdominal pain: Secondary | ICD-10-CM | POA: Diagnosis not present

## 2018-04-18 LAB — COMPREHENSIVE METABOLIC PANEL
ALT: 27 U/L (ref 0–44)
AST: 21 U/L (ref 15–41)
Albumin: 3.9 g/dL (ref 3.5–5.0)
Alkaline Phosphatase: 61 U/L (ref 38–126)
Anion gap: 11 (ref 5–15)
BUN: 57 mg/dL — ABNORMAL HIGH (ref 6–20)
CO2: 27 mmol/L (ref 22–32)
Calcium: 10.1 mg/dL (ref 8.9–10.3)
Chloride: 104 mmol/L (ref 98–111)
Creatinine, Ser: 7.69 mg/dL — ABNORMAL HIGH (ref 0.44–1.00)
GFR calc Af Amer: 7 mL/min — ABNORMAL LOW (ref >60)
GFR calc non Af Amer: 6 mL/min — ABNORMAL LOW (ref >60)
Glucose, Bld: 92 mg/dL (ref 70–99)
Potassium: 3.6 mmol/L (ref 3.5–5.1)
Sodium: 142 mmol/L (ref 135–145)
Total Bilirubin: 0.3 mg/dL (ref 0.3–1.2)
Total Protein: 7.3 g/dL (ref 6.5–8.1)

## 2018-04-18 LAB — I-STAT BETA HCG BLOOD, ED (MC, WL, AP ONLY): I-stat hCG, quantitative: <5 m[IU]/mL (ref <5)

## 2018-04-18 LAB — URINALYSIS, ROUTINE W REFLEX MICROSCOPIC
Bacteria, UA: NONE SEEN
Bilirubin Urine: NEGATIVE
Glucose, UA: NEGATIVE mg/dL
Hgb urine dipstick: NEGATIVE
Ketones, ur: NEGATIVE mg/dL
Leukocytes, UA: NEGATIVE
Nitrite: NEGATIVE
Protein, ur: 100 mg/dL — AB
Specific Gravity, Urine: 1.013 (ref 1.005–1.030)
pH: 8 (ref 5.0–8.0)

## 2018-04-18 LAB — CBC
HCT: 33.5 % — ABNORMAL LOW (ref 36.0–46.0)
Hemoglobin: 11.4 g/dL — ABNORMAL LOW (ref 12.0–15.0)
MCH: 32.2 pg (ref 26.0–34.0)
MCHC: 34 g/dL (ref 30.0–36.0)
MCV: 94.6 fL (ref 78.0–100.0)
Platelets: 208 10*3/uL (ref 150–400)
RBC: 3.54 MIL/uL — ABNORMAL LOW (ref 3.87–5.11)
RDW: 13.7 % (ref 11.5–15.5)
WBC: 9.3 10*3/uL (ref 4.0–10.5)

## 2018-04-18 LAB — LIPASE, BLOOD: Lipase: 46 U/L (ref 11–51)

## 2018-04-18 MED ORDER — HYDROMORPHONE HCL 1 MG/ML IJ SOLN
0.5000 mg | Freq: Once | INTRAMUSCULAR | Status: AC
  Administered 2018-04-18: 0.5 mg via INTRAVENOUS
  Filled 2018-04-18: qty 1

## 2018-04-18 MED ORDER — IOPAMIDOL (ISOVUE-300) INJECTION 61%
INTRAVENOUS | Status: DC
Start: 2018-04-18 — End: 2018-04-19
  Filled 2018-04-18: qty 100

## 2018-04-18 MED ORDER — DEXAMETHASONE SODIUM PHOSPHATE 10 MG/ML IJ SOLN
10.0000 mg | Freq: Once | INTRAMUSCULAR | Status: AC
  Administered 2018-04-18: 10 mg via INTRAVENOUS
  Filled 2018-04-18: qty 1

## 2018-04-18 MED ORDER — PANTOPRAZOLE SODIUM 20 MG PO TBEC: 20.0000 mg | DELAYED_RELEASE_TABLET | Freq: Two times a day (BID) | ORAL | 0 refills | Status: DC

## 2018-04-18 MED ORDER — IOPAMIDOL (ISOVUE-300) INJECTION 61%
100.0000 mL | Freq: Once | INTRAVENOUS | Status: AC | PRN
  Administered 2018-04-18: 100 mL via INTRAVENOUS

## 2018-04-18 MED ORDER — HYDROCODONE-ACETAMINOPHEN 5-325 MG PO TABS: 1.0000 | ORAL_TABLET | Freq: Four times a day (QID) | ORAL | 0 refills | Status: DC | PRN

## 2018-04-18 NOTE — ED Triage Notes (Signed)
Patient c/o left shoulder pain x2 weeks. Denies injury. Patient also c/o upper abdominal pain today. Denies N/V/D.

## 2018-04-19 DIAGNOSIS — N186 End stage renal disease: Secondary | ICD-10-CM | POA: Diagnosis not present

## 2018-04-19 DIAGNOSIS — D509 Iron deficiency anemia, unspecified: Secondary | ICD-10-CM | POA: Diagnosis not present

## 2018-04-19 DIAGNOSIS — D631 Anemia in chronic kidney disease: Secondary | ICD-10-CM | POA: Diagnosis not present

## 2018-04-19 DIAGNOSIS — N2581 Secondary hyperparathyroidism of renal origin: Secondary | ICD-10-CM | POA: Diagnosis not present

## 2018-04-20 ENCOUNTER — Ambulatory Visit: Payer: Medicare Other | Admitting: Gastroenterology

## 2018-04-21 DIAGNOSIS — D509 Iron deficiency anemia, unspecified: Secondary | ICD-10-CM | POA: Diagnosis not present

## 2018-04-21 DIAGNOSIS — N186 End stage renal disease: Secondary | ICD-10-CM | POA: Diagnosis not present

## 2018-04-21 DIAGNOSIS — N2581 Secondary hyperparathyroidism of renal origin: Secondary | ICD-10-CM | POA: Diagnosis not present

## 2018-04-21 DIAGNOSIS — D631 Anemia in chronic kidney disease: Secondary | ICD-10-CM | POA: Diagnosis not present

## 2018-04-22 ENCOUNTER — Ambulatory Visit: Payer: Medicare Other | Attending: Internal Medicine | Admitting: Internal Medicine

## 2018-04-22 ENCOUNTER — Encounter: Payer: Self-pay | Admitting: Internal Medicine

## 2018-04-22 VITALS — BP 150/102 | HR 85 | Temp 98.6°F | Resp 16 | Wt 160.2 lb

## 2018-04-22 DIAGNOSIS — Z8249 Family history of ischemic heart disease and other diseases of the circulatory system: Secondary | ICD-10-CM | POA: Diagnosis not present

## 2018-04-22 DIAGNOSIS — Z885 Allergy status to narcotic agent status: Secondary | ICD-10-CM | POA: Insufficient documentation

## 2018-04-22 DIAGNOSIS — Z886 Allergy status to analgesic agent status: Secondary | ICD-10-CM | POA: Diagnosis not present

## 2018-04-22 DIAGNOSIS — D638 Anemia in other chronic diseases classified elsewhere: Secondary | ICD-10-CM | POA: Insufficient documentation

## 2018-04-22 DIAGNOSIS — Z992 Dependence on renal dialysis: Secondary | ICD-10-CM | POA: Diagnosis not present

## 2018-04-22 DIAGNOSIS — Z888 Allergy status to other drugs, medicaments and biological substances status: Secondary | ICD-10-CM | POA: Diagnosis not present

## 2018-04-22 DIAGNOSIS — I132 Hypertensive heart and chronic kidney disease with heart failure and with stage 5 chronic kidney disease, or end stage renal disease: Secondary | ICD-10-CM | POA: Insufficient documentation

## 2018-04-22 DIAGNOSIS — I10 Essential (primary) hypertension: Secondary | ICD-10-CM

## 2018-04-22 DIAGNOSIS — Z7682 Awaiting organ transplant status: Secondary | ICD-10-CM | POA: Diagnosis not present

## 2018-04-22 DIAGNOSIS — M5412 Radiculopathy, cervical region: Secondary | ICD-10-CM | POA: Diagnosis not present

## 2018-04-22 DIAGNOSIS — N186 End stage renal disease: Secondary | ICD-10-CM | POA: Diagnosis not present

## 2018-04-22 DIAGNOSIS — Z79899 Other long term (current) drug therapy: Secondary | ICD-10-CM | POA: Diagnosis not present

## 2018-04-22 DIAGNOSIS — I5032 Chronic diastolic (congestive) heart failure: Secondary | ICD-10-CM | POA: Insufficient documentation

## 2018-04-22 DIAGNOSIS — E010 Iodine-deficiency related diffuse (endemic) goiter: Secondary | ICD-10-CM | POA: Insufficient documentation

## 2018-04-22 DIAGNOSIS — F17290 Nicotine dependence, other tobacco product, uncomplicated: Secondary | ICD-10-CM | POA: Insufficient documentation

## 2018-04-22 MED ORDER — CYCLOBENZAPRINE HCL 5 MG PO TABS: 5.0000 mg | ORAL_TABLET | Freq: Two times a day (BID) | ORAL | 0 refills | Status: DC | PRN

## 2018-04-22 MED ORDER — HYDROCODONE-ACETAMINOPHEN 10-325 MG PO TABS: 1.0000 | ORAL_TABLET | Freq: Three times a day (TID) | ORAL | 0 refills | Status: DC | PRN

## 2018-04-22 MED ORDER — PREDNISONE 20 MG PO TABS: ORAL_TABLET | ORAL | 0 refills | Status: DC

## 2018-04-22 MED ORDER — AMLODIPINE BESYLATE 10 MG PO TABS: 10.0000 mg | ORAL_TABLET | Freq: Every day | ORAL | 6 refills | Status: DC

## 2018-04-22 NOTE — Progress Notes (Signed)
Patient ID: Melody [name], female    DOB: 29-Mar-1976  MRN: [id number]  CC: Hospitalization Follow-up (ED f/u)   Subjective: Melody [name] is a 42 y.o. female who presents for chronic ds management Her concerns today include:  Pt with hx of HTN, tob dep, ESKD, ACD, chronic diastolic CHF  C/o pain in LT upper back over shoulder blade x 3 wks -no initiating factors Pain radiates down the arm at times and up the neck.  Also radiates at times down the left side thoracic paraspinal muscles.  Tingling sometimes at finger tips Worse when sitting up like driving and when in HD Better with laying flat on back and putting LT arm over head.   Seen in ED for same several days ago.  I do not see the ER physician's note in the system as yet.Marland Kitchen  No x-ray of the C-spine done.  Patient was given a few days of Vicodin which she states puts her to sleep but when she is up on awake pain is there  HTN: took meds already for today Reports BP in HD was 130/105 initially but came back down  End-stage kidney disease on hemodialysis: Since last visit she reports that she is now on the transplant list through [facility].  Patient Active Problem List   Diagnosis Date Noted  . ESRD (end stage renal disease) on dialysis ([place]) 09/29/2017  . Former tobacco use 09/29/2017  . Anemia of chronic disease   . Drug abuse ([place])   . Essential hypertension      Current Outpatient Medications on File Prior to Visit  Medication Sig Dispense Refill  . acetaminophen (TYLENOL) 500 MG tablet Take 1,000 mg by mouth every 6 (six) hours as needed for mild pain or headache.    . B Complex-C-Folic Acid (RENA-VITE RX) 1 MG TABS Take by mouth.    . calcium acetate (PHOSLO) 667 MG capsule Take 2 capsules (1,334 mg total) by mouth 3 (three) times daily with meals. 180 capsule 0  . HYDROcodone-acetaminophen (NORCO/VICODIN) 5-325 MG tablet Take 1 tablet by mouth every 6 (six) hours as needed. (Patient not taking: Reported  on 04/22/2018) 10 tablet 0  . labetalol (NORMODYNE) 300 MG tablet Take 1 tablet (300 mg total) by mouth 2 (two) times daily. 180 tablet 3  . lidocaine-prilocaine (EMLA) cream Apply 1 application topically daily as needed for irritation.  0  . lisinopril (PRINIVIL,ZESTRIL) 30 MG tablet Take 1 tablet (30 mg total) by mouth at bedtime. 90 tablet 3  . multivitamin (RENA-VIT) TABS tablet Take 1 tablet by mouth daily.  0  . pantoprazole (PROTONIX) 20 MG tablet Take 1 tablet (20 mg total) by mouth 2 (two) times daily before a meal. 60 tablet 0   No current facility-administered medications on file prior to visit.     Allergies  Allergen Reactions  . Acetaminophen     Tylenol #3 Causes nightmares  . Codeine Other (See Comments)  . Tramadol Hives    Social History   Socioeconomic History  . Marital status: Single    Spouse name: Not on file  . Number of children: 2   . Years of education: some colle  . Highest education level: Not on file  Occupational History  . Not on file  Social Needs  . Financial resource strain: Not on file  . Food insecurity:    Worry: Not on file    Inability: Not on file  . Transportation needs:    Medical:  Not on file    Non-medical: Not on file  Tobacco Use  . Smoking status: Former Smoker    Packs/day: 0.00    Years: 0.00    Pack years: 0.00    Types: Cigars, Cigarettes    Start date: 09/11/2016  . Smokeless tobacco: Never Used  Substance and Sexual Activity  . Alcohol use: Yes    Alcohol/week: 0.0 standard drinks    Comment: social  . Drug use: Yes    Types: Marijuana  . Sexual activity: Yes    Birth control/protection: None  Lifestyle  . Physical activity:    Days per week: Not on file    Minutes per session: Not on file  . Stress: Not on file  Relationships  . Social connections:    Talks on phone: Not on file    Gets together: Not on file    Attends religious service: Not on file    Active member of club or organization: Not on file      Attends meetings of clubs or organizations: Not on file    Relationship status: Not on file  . Intimate partner violence:    Fear of current or ex partner: Not on file    Emotionally abused: Not on file    Physically abused: Not on file    Forced sexual activity: Not on file  Other Topics Concern  . Not on file  Social History Narrative   Looking for work    60 and 13 yr son's.    3 grandkids     Family History  Problem Relation Age of Onset  . CAD Mother   . Hypertension Mother   . Heart disease Mother   . Kidney failure Mother   . CAD Brother   . Hypertension Other   . Kidney failure Other   . Hypertension Maternal Grandmother   . Kidney failure Maternal Grandmother   . Congestive Heart Failure Maternal Grandmother     Past Surgical History:  Procedure Laterality Date  . AV FISTULA PLACEMENT Right 09/15/2016   Procedure: ARTERIOVENOUS (AV) FISTULA CREATION UPPER RIGHT ARM;  Surgeon: Rosetta Posner, MD;  Location[facility] OR;  Service: Vascular;  Laterality: Right;  . AV FISTULA PLACEMENT Left 12/30/2016   Procedure: ARTERIOVENOUS (AV) FISTULA CREATION USING GORETEX STRETCH GRAFT;  Surgeon: Elam Dutch, MD;  Location: [facility] OR;  Service: Vascular;  Laterality: Left;  . AV FISTULA PLACEMENT Left 06/15/2017   Procedure: INSERTION OF ARTERIOVENOUS (AV) GORE-TEX GRAFT 6MM X 40CM LEFT ARM;  Surgeon: Rosetta Posner, MD;  Location: [place];  Service: Vascular;  Laterality: Left;  . DILATION AND CURETTAGE OF UTERUS    . INSERTION OF DIALYSIS CATHETER Right 09/15/2016   Procedure: INSERTION OF DIALYSIS CATHETER ON RIGHT SIDE;  Surgeon: Rosetta Posner, MD;  Location: [facility] OR;  Service: Vascular;  Laterality: Right;    ROS: Review of Systems Negative except as stated above. PHYSICAL EXAM: BP (!) 150/102 (BP Location: Right Arm, Patient Position: Sitting, Cuff Size: Normal)   Pulse 85   Temp 98.6 F (37 C) (Oral)   Resp 16   Wt 160 lb 3.2 oz (72.7 kg)   SpO2 98%   BMI 25.09 kg/m    Physical Exam  General appearance - alert, well appearing, and in no distress Mental status - normal mood, behavior, speech, dress, motor activity, and thought processes Neck: No carotid bruits.  Thyroid is enlarged especially on the right side.  No thyroid nodules.  No cervical lymphadenopathy Chest - clear to auscultation, no wheezes, rales or rhonchi, symmetric air entry Heart -regular rate and rhythm.  Systolic ejection murmur along the left sternal border Musculoskeletal -neck is supple.  No tenderness on palpation of cervical spine.  Mild tenderness over the upper medial border of the left scapula.  No point tenderness of the left glenohumeral joint.  Power in both upper extremities 5 out of 5 bilaterally.  Gross sensation intact in the upper extremities. Extremities -radial pulses equal in both upper extremities and persists with movement of the arms above the head.   ASSESSMENT AND PLAN: 1. Cervical radiculopathy We will start with a plain x-ray of the neck.  Will try her with prednisone to get this to calm down.  Limited supply of Norco and Flexeril given.  If symptoms persist and x-ray unrevealing, next step will be an MRI. - cyclobenzaprine (FLEXERIL) 5 MG tablet; Take 1 tablet (5 mg total) by mouth 2 (two) times daily as needed for muscle spasms.  Dispense: 20 tablet; Refill: 0 - predniSONE (DELTASONE) 20 MG tablet; 1.5 tabs daily x 3 days then 1 tab x 2 days  Dispense: 7 tablet; Refill: 0 - HYDROcodone-acetaminophen (NORCO) 10-325 MG tablet; Take 1 tablet by mouth every 8 (eight) hours as needed.  Dispense: 15 tablet; Refill: 0 - DG Cervical Spine Complete; Future  2. Essential hypertension Not at goal.  Increase amlodipine to 10 mg. - amLODipine (NORVASC) 10 MG tablet; Take 1 tablet (10 mg total) by mouth at bedtime.  Dispense: 30 tablet; Refill: 6  3. ESRD (end stage renal disease) on dialysis [facility]) On transplant list.  4. Thyromegaly - US Soft Tissue Head/Neck; Future -  TSH; Future  Patient was given the opportunity to ask questions.  Patient verbalized understanding of the plan and was able to repeat key elements of the plan.   Orders Placed This Encounter  Procedures  . DG Cervical Spine Complete  . US Soft Tissue Head/Neck  . TSH     Requested Prescriptions   Signed Prescriptions Disp Refills  . cyclobenzaprine (FLEXERIL) 5 MG tablet 20 tablet 0    Sig: Take 1 tablet (5 mg total) by mouth 2 (two) times daily as needed for muscle spasms.  . predniSONE (DELTASONE) 20 MG tablet 7 tablet 0    Sig: 1.5 tabs daily x 3 days then 1 tab x 2 days  . HYDROcodone-acetaminophen (NORCO) 10-325 MG tablet 15 tablet 0    Sig: Take 1 tablet by mouth every 8 (eight) hours as needed.  Marland Kitchen amLODipine (NORVASC) 10 MG tablet 30 tablet 6    Sig: Take 1 tablet (10 mg total) by mouth at bedtime.    No follow-ups on file.  Karle Plumber, MD, FACP

## 2018-04-23 ENCOUNTER — Telehealth: Payer: Self-pay

## 2018-04-23 DIAGNOSIS — N186 End stage renal disease: Secondary | ICD-10-CM | POA: Diagnosis not present

## 2018-04-23 DIAGNOSIS — D631 Anemia in chronic kidney disease: Secondary | ICD-10-CM | POA: Diagnosis not present

## 2018-04-23 DIAGNOSIS — N2581 Secondary hyperparathyroidism of renal origin: Secondary | ICD-10-CM | POA: Diagnosis not present

## 2018-04-23 DIAGNOSIS — D509 Iron deficiency anemia, unspecified: Secondary | ICD-10-CM | POA: Diagnosis not present

## 2018-04-23 NOTE — Telephone Encounter (Signed)
Ginger from imaging called to inform pcp that the orders placed for patient were incorrect. If pcp could sign off on corrected orders at her earliest convenience.

## 2018-04-25 DIAGNOSIS — N186 End stage renal disease: Secondary | ICD-10-CM | POA: Diagnosis not present

## 2018-04-25 DIAGNOSIS — Z992 Dependence on renal dialysis: Secondary | ICD-10-CM | POA: Diagnosis not present

## 2018-04-25 DIAGNOSIS — I129 Hypertensive chronic kidney disease with stage 1 through stage 4 chronic kidney disease, or unspecified chronic kidney disease: Secondary | ICD-10-CM | POA: Diagnosis not present

## 2018-04-25 NOTE — Telephone Encounter (Signed)
I did not receive a order on this patient.  Perhaps the order went to a different PCP

## 2018-04-26 DIAGNOSIS — N2581 Secondary hyperparathyroidism of renal origin: Secondary | ICD-10-CM | POA: Diagnosis not present

## 2018-04-26 DIAGNOSIS — Z23 Encounter for immunization: Secondary | ICD-10-CM | POA: Diagnosis not present

## 2018-04-26 DIAGNOSIS — D631 Anemia in chronic kidney disease: Secondary | ICD-10-CM | POA: Diagnosis not present

## 2018-04-26 DIAGNOSIS — N186 End stage renal disease: Secondary | ICD-10-CM | POA: Diagnosis not present

## 2018-04-26 DIAGNOSIS — Z992 Dependence on renal dialysis: Secondary | ICD-10-CM | POA: Diagnosis not present

## 2018-04-27 NOTE — ED Provider Notes (Signed)
[facility] DEPT Provider Note   CSN: [id number] Arrival date & time: 04/18/18  1945     History   Chief Complaint Chief Complaint  Patient presents with  . Shoulder Pain  . Abdominal Pain    HPI Melody [name] is a 42 y.o. female.  HPI   42 year old female with 2 complaints.  Is complaining primarily of left lateral neck/left shoulder pain.  Onset about 2 weeks ago.  Denies any trauma.  Ache at rest.  Sniffily worse with certain movements.  Radiates into her left upper extremity.  No numbness, tingling or loss of strength.  Also complaining of abdominal pain which began today.  Pain is in the upper abdomen.  Does not radiate.  Denies any associated nausea, vomiting or diarrhea.  End-stage renal disease and says really does not make much urine.  Past Medical History:  Diagnosis Date  . Alcohol abuse   . Chronic kidney disease    staqge 5  . Drug abuse ([place])   . Ectopic pregnancy   . Headache    Migraines  . Hypertension Dx May 2016  . Tobacco abuse     Patient Active Problem List   Diagnosis Date Noted  . ESRD (end stage renal disease) on dialysis ([place]) 09/29/2017  . Former tobacco use 09/29/2017  . Anemia of chronic disease   . Drug abuse ([place])   . Essential hypertension     Past Surgical History:  Procedure Laterality Date  . AV FISTULA PLACEMENT Right 09/15/2016   Procedure: ARTERIOVENOUS (AV) FISTULA CREATION UPPER RIGHT ARM;  Surgeon: Rosetta Posner, MD;  Location: [facility] OR;  Service: Vascular;  Laterality: Right;  . AV FISTULA PLACEMENT Left 12/30/2016   Procedure: ARTERIOVENOUS (AV) FISTULA CREATION USING GORETEX STRETCH GRAFT;  Surgeon: Elam Dutch, MD;  Location: [facility] OR;  Service: Vascular;  Laterality: Left;  . AV FISTULA PLACEMENT Left 06/15/2017   Procedure: INSERTION OF ARTERIOVENOUS (AV) GORE-TEX GRAFT 6MM X 40CM LEFT ARM;  Surgeon: Rosetta Posner, MD;  Location: [facility];  Service: Vascular;  Laterality: Left;  .  DILATION AND CURETTAGE OF UTERUS    . INSERTION OF DIALYSIS CATHETER Right 09/15/2016   Procedure: INSERTION OF DIALYSIS CATHETER ON RIGHT SIDE;  Surgeon: Rosetta Posner, MD;  Location: [facility] OR;  Service: Vascular;  Laterality: Right;     OB History   None      Home Medications    Prior to Admission medications   Medication Sig Start Date End Date Taking? Authorizing Provider  acetaminophen (TYLENOL) 500 MG tablet Take 1,000 mg by mouth every 6 (six) hours as needed for mild pain or headache.    [provider]  amLODipine (NORVASC) 10 MG tablet Take 1 tablet (10 mg total) by mouth at bedtime. 04/22/18   Ladell Pier, MD  B Complex-C-Folic Acid (RENA-VITE RX) 1 MG TABS Take by mouth.    [provider]  calcium acetate (PHOSLO) 667 MG capsule Take 2 capsules (1,334 mg total) by mouth 3 (three) times daily with meals. 09/19/16   Verlee Monte, MD  cyclobenzaprine (FLEXERIL) 5 MG tablet Take 1 tablet (5 mg total) by mouth 2 (two) times daily as needed for muscle spasms. 04/22/18   Ladell Pier, MD  HYDROcodone-acetaminophen (NORCO) 10-325 MG tablet Take 1 tablet by mouth every 8 (eight) hours as needed. 04/22/18   Ladell Pier, MD  HYDROcodone-acetaminophen (NORCO/VICODIN) 5-325 MG tablet Take 1 tablet by mouth every 6 (  six) hours as needed. Patient not taking: Reported on 04/22/2018 04/18/18   Virgel Manifold, MD  labetalol (NORMODYNE) 300 MG tablet Take 1 tablet (300 mg total) by mouth 2 (two) times daily. 09/29/17   Ladell Pier, MD  lidocaine-prilocaine (EMLA) cream Apply 1 application topically daily as needed for irritation. 04/23/17   [provider]  lisinopril (PRINIVIL,ZESTRIL) 30 MG tablet Take 1 tablet (30 mg total) by mouth at bedtime. 09/29/17   Ladell Pier, MD  multivitamin (RENA-VIT) TABS tablet Take 1 tablet by mouth daily. 04/24/17   [provider]  pantoprazole (PROTONIX) 20 MG tablet Take 1 tablet (20 mg total) by mouth 2  (two) times daily before a meal. 04/18/18   Virgel Manifold, MD  predniSONE (DELTASONE) 20 MG tablet 1.5 tabs daily x 3 days then 1 tab x 2 days 04/22/18   Ladell Pier, MD    Family History Family History  Problem Relation Age of Onset  . CAD Mother   . Hypertension Mother   . Heart disease Mother   . Kidney failure Mother   . CAD Brother   . Hypertension Other   . Kidney failure Other   . Hypertension Maternal Grandmother   . Kidney failure Maternal Grandmother   . Congestive Heart Failure Maternal Grandmother     Social History Social History   Tobacco Use  . Smoking status: Former Smoker    Packs/day: 0.00    Years: 0.00    Pack years: 0.00    Types: Cigars, Cigarettes    Start date: 09/11/2016  . Smokeless tobacco: Never Used  Substance Use Topics  . Alcohol use: Yes    Alcohol/week: 0.0 standard drinks    Comment: social  . Drug use: Yes    Types: Marijuana     Allergies   Acetaminophen; Codeine; and Tramadol   Review of Systems Review of Systems  All systems reviewed and negative, other than as noted in HPI.  Physical Exam Updated Vital Signs BP 122/81 (BP Location: Right Arm)   Pulse 70   Temp 98.9 F (37.2 C) (Oral)   Resp 14   Ht 5\' 7"  (1.702 m)   Wt 72.6 kg   SpO2 100%   BMI 25.06 kg/m   Physical Exam  Constitutional: She appears well-developed and well-nourished. No distress.  HENT:  Head: Normocephalic and atraumatic.  Eyes: Conjunctivae are normal. Right eye exhibits no discharge. Left eye exhibits no discharge.  Neck: Neck supple.  Cardiovascular: Normal rate, regular rhythm and normal heart sounds. Exam reveals no gallop and no friction rub.  No murmur heard. Pulmonary/Chest: Effort normal and breath sounds normal. No respiratory distress.  Abdominal: Soft. She exhibits no distension. There is tenderness.  Tenderness in epigastrium to left upper quadrant without rebound or guarding.  No distention.  Musculoskeletal: She  exhibits no edema or tenderness.  Increased neck and left shoulder pain with neck flexion.  Neurovascular intact.  No midline spinal tenderness.  No deformity or overlying skin lesions.  Neurological: She is alert.  Skin: Skin is warm and dry.  Psychiatric: She has a normal mood and affect. Her behavior is normal. Thought content normal.  Nursing note and vitals reviewed.    ED Treatments / Results  Labs (all labs ordered are listed, but only abnormal results are displayed) Labs Reviewed  COMPREHENSIVE METABOLIC PANEL - Abnormal; Notable for the following components:      Result Value   BUN 57 (*)    Creatinine,  Ser 7.69 (*)    GFR calc non Af Amer 6 (*)    GFR calc Af Amer 7 (*)    All other components within normal limits  CBC - Abnormal; Notable for the following components:   RBC 3.54 (*)    Hemoglobin 11.4 (*)    HCT 33.5 (*)    All other components within normal limits  URINALYSIS, ROUTINE W REFLEX MICROSCOPIC - Abnormal; Notable for the following components:   APPearance HAZY (*)    Protein, ur 100 (*)    All other components within normal limits  LIPASE, BLOOD  I-STAT BETA HCG BLOOD, ED ([facility], [facility], [facility] ONLY)    EKG None  Radiology No results found.   Ct Abdomen Pelvis W Contrast  Result Date: 04/18/2018 CLINICAL DATA:  Upper abdominal pain and LEFT shoulder pain. EXAM: CT ABDOMEN AND PELVIS WITH CONTRAST TECHNIQUE: Multidetector CT imaging of the abdomen and pelvis was performed using the standard protocol following bolus administration of intravenous contrast. CONTRAST:  154mL ISOVUE-300 IOPAMIDOL (ISOVUE-300) INJECTION 61% COMPARISON:  CT abdomen dated 07/30/2009. FINDINGS: Lower chest: No acute abnormality. Hepatobiliary: No focal liver abnormality is seen. No gallstones, gallbladder wall thickening, or biliary dilatation. Pancreas: Unremarkable. No pancreatic ductal dilatation or surrounding inflammatory changes. Spleen: Normal in size without focal abnormality.  Adrenals/Urinary Tract: Adrenal glands appear normal. Kidneys appear normal without mass, stone or hydronephrosis. No ureteral or bladder calculi identified. Bladder appears normal. Stomach/Bowel: No dilated large or small bowel loops. No bowel wall thickening or evidence of bowel wall inflammation. Appendix is normal. Stomach is unremarkable, partially decompressed. Vascular/Lymphatic: Aortic atherosclerosis. No enlarged abdominal or pelvic lymph nodes. Reproductive: Multiple uterine fibroids. Solid mass within the posterior pelvis is also most likely an exophytic fibroid, measuring 6.6 cm. Cystic mass in the LEFT adnexal region measures 3.6 cm, likely a small ovarian cyst. No free fluid or evidence of inflammatory change within either adnexal region Other: No free fluid or abscess collection. No free intraperitoneal air. Musculoskeletal: No acute or suspicious osseous finding. IMPRESSION: 1. No acute findings. No bowel obstruction or evidence of bowel wall inflammation. No evidence of acute solid organ abnormality. No renal or ureteral calculi. No free fluid. 2. Leiomyomatous uterus, including a presumed large fibroid exophytic into the posterior pelvis measuring 6.6 cm. Multiple corresponding fibroids were described on pelvic ultrasound of 10/23/2015. 3.  Aortic Atherosclerosis (ICD10-I70.0). Electronically Signed   By: Franki Cabot M.D.   On: 04/18/2018 23:38    Procedures Procedures (including critical care time)  Medications Ordered in ED Medications  HYDROmorphone (DILAUDID) injection 0.5 mg (0.5 mg Intravenous Given 04/18/18 2306)  dexamethasone (DECADRON) injection 10 mg (10 mg Intravenous Given 04/18/18 2306)  iopamidol (ISOVUE-300) 61 % injection 100 mL (100 mLs Intravenous Contrast Given 04/18/18 2316)     Initial Impression / Assessment and Plan / ED Course  I have reviewed the triage vital signs and the nursing notes.  Pertinent labs & imaging results that were available during my care  of the patient were reviewed by me and considered in my medical decision making (see chart for details).     I have reviewed the triage vital signs and the nursing notes. Prior records were reviewed for additional information.    Pertinent labs & imaging results that were available during my care of the patient were reviewed by me and considered in my medical decision making (see chart for details).  42 year old female with left shoulder pain and abdominal pain.  I suspect  these complaints are unrelated.  Her shoulder pain seems most consistent with a cervical radiculopathy.  Symptomatic treatment.  She is also having some upper abdominal pain.  Fibroids noted on imaging but clinically does not correlate.  I doubt emergent process of her abdominal pain.  She has no peritonitis.  Labs fairly reassuring aside from known chronic kidney disease.Marland Kitchen  Return precautions were discussed.  Outpatient follow-up otherwise.  Final Clinical Impressions(s) / ED Diagnoses   Final diagnoses:  Cervical radiculopathy  Pain of upper abdomen    ED Discharge Orders         Ordered    HYDROcodone-acetaminophen (NORCO/VICODIN) 5-325 MG tablet  Every 6 hours PRN     04/18/18 2347    pantoprazole (PROTONIX) 20 MG tablet  2 times daily before meals     04/18/18 2359           Virgel Manifold, MD 04/27/18 [phone]

## 2018-04-28 ENCOUNTER — Ambulatory Visit: Payer: Medicare Other

## 2018-04-28 ENCOUNTER — Ambulatory Visit (HOSPITAL_COMMUNITY)
Admission: RE | Admit: 2018-04-28 | Discharge: 2018-04-28 | Disposition: A | Discharge status: Home / Self Care | Payer: Medicare Other | Source: Ambulatory Visit | Attending: Internal Medicine | Admitting: Internal Medicine

## 2018-04-28 DIAGNOSIS — Z992 Dependence on renal dialysis: Secondary | ICD-10-CM | POA: Diagnosis not present

## 2018-04-28 DIAGNOSIS — E042 Nontoxic multinodular goiter: Secondary | ICD-10-CM | POA: Insufficient documentation

## 2018-04-28 DIAGNOSIS — N186 End stage renal disease: Secondary | ICD-10-CM | POA: Diagnosis not present

## 2018-04-28 DIAGNOSIS — D631 Anemia in chronic kidney disease: Secondary | ICD-10-CM | POA: Diagnosis not present

## 2018-04-28 DIAGNOSIS — Z23 Encounter for immunization: Secondary | ICD-10-CM | POA: Diagnosis not present

## 2018-04-28 DIAGNOSIS — E010 Iodine-deficiency related diffuse (endemic) goiter: Secondary | ICD-10-CM

## 2018-04-28 DIAGNOSIS — N2581 Secondary hyperparathyroidism of renal origin: Secondary | ICD-10-CM | POA: Diagnosis not present

## 2018-04-28 NOTE — Progress Notes (Signed)
Patient here for lab visit  

## 2018-04-29 ENCOUNTER — Other Ambulatory Visit: Payer: Self-pay | Admitting: Internal Medicine

## 2018-04-29 DIAGNOSIS — E042 Nontoxic multinodular goiter: Secondary | ICD-10-CM

## 2018-04-29 LAB — TSH: TSH: 1.34 u[IU]/mL (ref 0.450–4.500)

## 2018-04-30 ENCOUNTER — Telehealth: Payer: Self-pay

## 2018-04-30 DIAGNOSIS — D631 Anemia in chronic kidney disease: Secondary | ICD-10-CM | POA: Diagnosis not present

## 2018-04-30 DIAGNOSIS — Z23 Encounter for immunization: Secondary | ICD-10-CM | POA: Diagnosis not present

## 2018-04-30 DIAGNOSIS — N186 End stage renal disease: Secondary | ICD-10-CM | POA: Diagnosis not present

## 2018-04-30 DIAGNOSIS — N2581 Secondary hyperparathyroidism of renal origin: Secondary | ICD-10-CM | POA: Diagnosis not present

## 2018-04-30 DIAGNOSIS — Z992 Dependence on renal dialysis: Secondary | ICD-10-CM | POA: Diagnosis not present

## 2018-04-30 NOTE — Telephone Encounter (Signed)
Contacted pt to go over lab work and [place] results. Pt is aware and doesn't have any questions or concerns

## 2018-05-03 DIAGNOSIS — Z992 Dependence on renal dialysis: Secondary | ICD-10-CM | POA: Diagnosis not present

## 2018-05-03 DIAGNOSIS — N2581 Secondary hyperparathyroidism of renal origin: Secondary | ICD-10-CM | POA: Diagnosis not present

## 2018-05-03 DIAGNOSIS — Z23 Encounter for immunization: Secondary | ICD-10-CM | POA: Diagnosis not present

## 2018-05-03 DIAGNOSIS — D631 Anemia in chronic kidney disease: Secondary | ICD-10-CM | POA: Diagnosis not present

## 2018-05-03 DIAGNOSIS — N186 End stage renal disease: Secondary | ICD-10-CM | POA: Diagnosis not present

## 2018-05-04 ENCOUNTER — Ambulatory Visit (HOSPITAL_COMMUNITY)
Admission: RE | Admit: 2018-05-04 | Discharge: 2018-05-04 | Disposition: A | Discharge status: Home / Self Care | Payer: Medicare Other | Source: Ambulatory Visit | Attending: Internal Medicine | Admitting: Internal Medicine

## 2018-05-04 ENCOUNTER — Ambulatory Visit (HOSPITAL_BASED_OUTPATIENT_CLINIC_OR_DEPARTMENT_OTHER): Payer: Medicare Other | Admitting: Internal Medicine

## 2018-05-04 ENCOUNTER — Encounter: Payer: Self-pay | Admitting: Internal Medicine

## 2018-05-04 VITALS — BP 103/72 | HR 77 | Temp 98.6°F | Resp 16 | Wt 165.4 lb

## 2018-05-04 DIAGNOSIS — I132 Hypertensive heart and chronic kidney disease with heart failure and with stage 5 chronic kidney disease, or end stage renal disease: Secondary | ICD-10-CM | POA: Diagnosis not present

## 2018-05-04 DIAGNOSIS — Z8249 Family history of ischemic heart disease and other diseases of the circulatory system: Secondary | ICD-10-CM | POA: Diagnosis not present

## 2018-05-04 DIAGNOSIS — E042 Nontoxic multinodular goiter: Secondary | ICD-10-CM

## 2018-05-04 DIAGNOSIS — Z841 Family history of disorders of kidney and ureter: Secondary | ICD-10-CM | POA: Insufficient documentation

## 2018-05-04 DIAGNOSIS — Z888 Allergy status to other drugs, medicaments and biological substances status: Secondary | ICD-10-CM | POA: Insufficient documentation

## 2018-05-04 DIAGNOSIS — Z79899 Other long term (current) drug therapy: Secondary | ICD-10-CM | POA: Diagnosis not present

## 2018-05-04 DIAGNOSIS — E663 Overweight: Secondary | ICD-10-CM | POA: Insufficient documentation

## 2018-05-04 DIAGNOSIS — I10 Essential (primary) hypertension: Secondary | ICD-10-CM | POA: Diagnosis present

## 2018-05-04 DIAGNOSIS — I5032 Chronic diastolic (congestive) heart failure: Secondary | ICD-10-CM | POA: Diagnosis not present

## 2018-05-04 DIAGNOSIS — F1290 Cannabis use, unspecified, uncomplicated: Secondary | ICD-10-CM | POA: Insufficient documentation

## 2018-05-04 DIAGNOSIS — M5412 Radiculopathy, cervical region: Secondary | ICD-10-CM

## 2018-05-04 DIAGNOSIS — Z6825 Body mass index (BMI) 25.0-25.9, adult: Secondary | ICD-10-CM | POA: Insufficient documentation

## 2018-05-04 DIAGNOSIS — Z87891 Personal history of nicotine dependence: Secondary | ICD-10-CM | POA: Diagnosis not present

## 2018-05-04 DIAGNOSIS — Z992 Dependence on renal dialysis: Secondary | ICD-10-CM | POA: Insufficient documentation

## 2018-05-04 DIAGNOSIS — N186 End stage renal disease: Secondary | ICD-10-CM | POA: Diagnosis not present

## 2018-05-04 DIAGNOSIS — M4802 Spinal stenosis, cervical region: Secondary | ICD-10-CM | POA: Insufficient documentation

## 2018-05-04 DIAGNOSIS — Z885 Allergy status to narcotic agent status: Secondary | ICD-10-CM | POA: Diagnosis not present

## 2018-05-04 DIAGNOSIS — M542 Cervicalgia: Secondary | ICD-10-CM | POA: Diagnosis not present

## 2018-05-04 DIAGNOSIS — M50122 Cervical disc disorder at C5-C6 level with radiculopathy: Secondary | ICD-10-CM | POA: Insufficient documentation

## 2018-05-04 MED ORDER — CYCLOBENZAPRINE HCL 5 MG PO TABS: 5.0000 mg | ORAL_TABLET | Freq: Two times a day (BID) | ORAL | 1 refills | Status: DC | PRN

## 2018-05-04 MED ORDER — GABAPENTIN 100 MG PO CAPS: ORAL_CAPSULE | ORAL | 3 refills | Status: DC

## 2018-05-04 NOTE — Patient Instructions (Signed)
Please go to [facility] radiology to have the x-ray done of the cervical spine.  Further management will be based on results. Continue the Flexeril which is the muscle relaxant as needed.  We have added a low-dose of gabapentin to take in the evenings on dialysis days.

## 2018-05-04 NOTE — Progress Notes (Signed)
Patient ID: Melody [name], female    DOB: [birth year]  MRN: [id number]  CC: Hypertension   Subjective: Melody [name] is a 42 y.o. female who presents for follow-up visit for hypertension, thyroid nodule and upper back pain. Her concerns today include:  Pt with hx of HTN, former tob dep, ESKD, ACD, chronic diastolic CHF  HTN: Norvasc increased to 10 mg on last visit.  Reports compliance.  Limit salt in the foods.  No chest pains, shortness of breath or lower extremity edema.  Thyromegaly: TSH was normal.  Thyroid ultrasound revealed 2 nodules greater than 1 cm.  Patient referred to endocrinology.  Neck pain: On last visit she complained of pain in the upper back left side over the shoulder blade that was present for 3 weeks without initiating factors.  She was assessed to have cervical radiculopathy.  She was given a course of prednisone, Flexeril and Norco.  X-ray of the C-spine was ordered but patient forgot to have it done.  She reports that the medication would calm the pain down when she takes it but once it starts to wear off she can feel the pain returning.  Pain is worse when she is sitting up straight leg driving in her car.  Also feels it at nights especially when she turns in her sleep.  It still is a shooting pain down the left arm.  She describes it as a shooting pulling type pain.  Rates it as 8 out of 10 most of the times.  Social history: She quit smoking last year.  She drinks alcohol occasionally.  Uses marijuana several times a week which she states helps with her appetite.  Reports she had loss a lot of weight when she started dialysis. Patient Active Problem List   Diagnosis Date Noted  . ESRD (end stage renal disease) on dialysis ([place]) 09/29/2017  . Former tobacco use 09/29/2017  . Anemia of chronic disease   . Drug abuse ([place])   . Essential hypertension      Current Outpatient Medications on File Prior to Visit  Medication Sig Dispense Refill  .  acetaminophen (TYLENOL) 500 MG tablet Take 1,000 mg by mouth every 6 (six) hours as needed for mild pain or headache.    Marland Kitchen amLODipine (NORVASC) 10 MG tablet Take 1 tablet (10 mg total) by mouth at bedtime. 30 tablet 6  . B Complex-C-Folic Acid (RENA-VITE RX) 1 MG TABS Take by mouth.    . calcium acetate (PHOSLO) 667 MG capsule Take 2 capsules (1,334 mg total) by mouth 3 (three) times daily with meals. 180 capsule 0  . HYDROcodone-acetaminophen (NORCO) 10-325 MG tablet Take 1 tablet by mouth every 8 (eight) hours as needed. 15 tablet 0  . HYDROcodone-acetaminophen (NORCO/VICODIN) 5-325 MG tablet Take 1 tablet by mouth every 6 (six) hours as needed. (Patient not taking: Reported on 04/22/2018) 10 tablet 0  . labetalol (NORMODYNE) 300 MG tablet Take 1 tablet (300 mg total) by mouth 2 (two) times daily. 180 tablet 3  . lidocaine-prilocaine (EMLA) cream Apply 1 application topically daily as needed for irritation.  0  . lisinopril (PRINIVIL,ZESTRIL) 30 MG tablet Take 1 tablet (30 mg total) by mouth at bedtime. 90 tablet 3  . multivitamin (RENA-VIT) TABS tablet Take 1 tablet by mouth daily.  0  . pantoprazole (PROTONIX) 20 MG tablet Take 1 tablet (20 mg total) by mouth 2 (two) times daily before a meal. 60 tablet 0  . predniSONE (DELTASONE) 20 MG tablet  1.5 tabs daily x 3 days then 1 tab x 2 days 7 tablet 0   No current facility-administered medications on file prior to visit.     Allergies  Allergen Reactions  . Acetaminophen     Tylenol #3 Causes nightmares  . Codeine Other (See Comments)  . Tramadol Hives    Social History   Socioeconomic History  . Marital status: Single    Spouse name: Not on file  . Number of children: 2   . Years of education: some colle  . Highest education level: Not on file  Occupational History  . Not on file  Social Needs  . Financial resource strain: Not on file  . Food insecurity:    Worry: Not on file    Inability: Not on file  . Transportation needs:      Medical: Not on file    Non-medical: Not on file  Tobacco Use  . Smoking status: Former Smoker    Packs/day: 0.00    Years: 0.00    Pack years: 0.00    Types: Cigars, Cigarettes    Start date: 09/11/2016  . Smokeless tobacco: Never Used  Substance and Sexual Activity  . Alcohol use: Yes    Alcohol/week: 0.0 standard drinks    Comment: social  . Drug use: Yes    Types: Marijuana  . Sexual activity: Yes    Birth control/protection: None  Lifestyle  . Physical activity:    Days per week: Not on file    Minutes per session: Not on file  . Stress: Not on file  Relationships  . Social connections:    Talks on phone: Not on file    Gets together: Not on file    Attends religious service: Not on file    Active member of club or organization: Not on file    Attends meetings of clubs or organizations: Not on file    Relationship status: Not on file  . Intimate partner violence:    Fear of current or ex partner: Not on file    Emotionally abused: Not on file    Physically abused: Not on file    Forced sexual activity: Not on file  Other Topics Concern  . Not on file  Social History Narrative   Looking for work    36 and 13 yr son's.    3 grandkids     Family History  Problem Relation Age of Onset  . CAD Mother   . Hypertension Mother   . Heart disease Mother   . Kidney failure Mother   . CAD Brother   . Hypertension Other   . Kidney failure Other   . Hypertension Maternal Grandmother   . Kidney failure Maternal Grandmother   . Congestive Heart Failure Maternal Grandmother     Past Surgical History:  Procedure Laterality Date  . AV FISTULA PLACEMENT Right 09/15/2016   Procedure: ARTERIOVENOUS (AV) FISTULA CREATION UPPER RIGHT ARM;  Surgeon: Rosetta Posner, MD;  Location: [facility] OR;  Service: Vascular;  Laterality: Right;  . AV FISTULA PLACEMENT Left 12/30/2016   Procedure: ARTERIOVENOUS (AV) FISTULA CREATION USING GORETEX STRETCH GRAFT;  Surgeon: Elam Dutch, MD;   Location: [facility] OR;  Service: Vascular;  Laterality: Left;  . AV FISTULA PLACEMENT Left 06/15/2017   Procedure: INSERTION OF ARTERIOVENOUS (AV) GORE-TEX GRAFT 6MM X 40CM LEFT ARM;  Surgeon: Rosetta Posner, MD;  Location: [facility];  Service: Vascular;  Laterality: Left;  . DILATION AND CURETTAGE  OF UTERUS    . INSERTION OF DIALYSIS CATHETER Right 09/15/2016   Procedure: INSERTION OF DIALYSIS CATHETER ON RIGHT SIDE;  Surgeon: Rosetta Posner, MD;  Location: [facility] OR;  Service: Vascular;  Laterality: Right;    ROS: Review of Systems Neg except as above  PHYSICAL EXAM: BP 103/72   Pulse 77   Temp 98.6 F (37 C) (Oral)   Resp 16   Wt 165 lb 6.4 oz (75 kg)   SpO2 98%   BMI 25.91 kg/m   Physical Exam General appearance - alert, well appearing, and in no distress Mental status - normal mood, behavior, speech, dress, motor activity, and thought processes Neck - supple, no significant adenopathy Chest - clear to auscultation, no wheezes, rales or rhonchi, symmetric air entry Heart - normal rate, regular rhythm, normal S1, S2, no murmurs, rubs, clicks or gallops Musculoskeletal -no tenderness on palpation of the cervical spine.  Mild tenderness on palpation over the left trapezius and the supraspinatus muscles.  Power in both upper extremities are full.  ASSESSMENT AND PLAN: 1. Cervical radiculopathy Encourage patient to go and have the x-ray done.  If negative we will need to do a CT or MRI of the cervical spine. Refill given on Flexeril to use as needed.  Gabapentin added to use only on dialysis days. - cyclobenzaprine (FLEXERIL) 5 MG tablet; Take 1 tablet (5 mg total) by mouth 2 (two) times daily as needed for muscle spasms.  Dispense: 660 tablet; Refill: 1 - gabapentin (NEURONTIN) 100 MG capsule; Take 2 tabs PO in the evenings only on dialysis days.  Dispense: 60 capsule; Refill: 3  2. Essential hypertension At goal.  Continue amlodipine-diet.  3. Multiple thyroid nodules Referred to  endocrinology.  4. Marijuana use, continuous Discouraged use.  I also discouraged any further weight gain as her BMI is now in the range for being overweight.  Addendum: After patient had left realized that I had written the printed Flexeril prescription with amount of 660 tablets.  I called and left a message on her cell phone letting her know that I made an error on that prescription and not to fill it.  I have sent the new prescription to [facility] on [facility]. Patient was given the opportunity to ask questions.  Patient verbalized understanding of the plan and was able to repeat key elements of the plan.   No orders of the defined types were placed in this encounter.    Requested Prescriptions   Signed Prescriptions Disp Refills  . gabapentin (NEURONTIN) 100 MG capsule 60 capsule 3    Sig: Take 2 tabs PO in the evenings only on dialysis days.  . cyclobenzaprine (FLEXERIL) 5 MG tablet 60 tablet 1    Sig: Take 1 tablet (5 mg total) by mouth 2 (two) times daily as needed for muscle spasms.    Return in about 2 months (around 07/04/2018).  Karle Plumber, MD, FACP

## 2018-05-05 ENCOUNTER — Telehealth: Payer: Self-pay

## 2018-05-05 DIAGNOSIS — Z23 Encounter for immunization: Secondary | ICD-10-CM | POA: Diagnosis not present

## 2018-05-05 DIAGNOSIS — Z992 Dependence on renal dialysis: Secondary | ICD-10-CM | POA: Diagnosis not present

## 2018-05-05 DIAGNOSIS — D631 Anemia in chronic kidney disease: Secondary | ICD-10-CM | POA: Diagnosis not present

## 2018-05-05 DIAGNOSIS — N2581 Secondary hyperparathyroidism of renal origin: Secondary | ICD-10-CM | POA: Diagnosis not present

## 2018-05-05 DIAGNOSIS — N186 End stage renal disease: Secondary | ICD-10-CM | POA: Diagnosis not present

## 2018-05-05 NOTE — Telephone Encounter (Signed)
Contacted pt to go over xray results pt is aware   Contacted the scheduling department and scheduled MRI  MRI is scheduled for May 13, 2018 @2pm  @[facility]. Pt is to arrive @130pm   Contacted pt back and gave appointment information for MRI

## 2018-05-05 NOTE — Addendum Note (Signed)
Addended by: Karle Plumber B on: 05/05/2018 11:31 AM   Modules accepted: Orders

## 2018-05-07 DIAGNOSIS — Z992 Dependence on renal dialysis: Secondary | ICD-10-CM | POA: Diagnosis not present

## 2018-05-07 DIAGNOSIS — N2581 Secondary hyperparathyroidism of renal origin: Secondary | ICD-10-CM | POA: Diagnosis not present

## 2018-05-07 DIAGNOSIS — N186 End stage renal disease: Secondary | ICD-10-CM | POA: Diagnosis not present

## 2018-05-07 DIAGNOSIS — Z23 Encounter for immunization: Secondary | ICD-10-CM | POA: Diagnosis not present

## 2018-05-07 DIAGNOSIS — D631 Anemia in chronic kidney disease: Secondary | ICD-10-CM | POA: Diagnosis not present

## 2018-05-10 DIAGNOSIS — Z992 Dependence on renal dialysis: Secondary | ICD-10-CM | POA: Diagnosis not present

## 2018-05-10 DIAGNOSIS — N186 End stage renal disease: Secondary | ICD-10-CM | POA: Diagnosis not present

## 2018-05-12 DIAGNOSIS — N2581 Secondary hyperparathyroidism of renal origin: Secondary | ICD-10-CM | POA: Diagnosis not present

## 2018-05-12 DIAGNOSIS — D631 Anemia in chronic kidney disease: Secondary | ICD-10-CM | POA: Diagnosis not present

## 2018-05-12 DIAGNOSIS — Z23 Encounter for immunization: Secondary | ICD-10-CM | POA: Diagnosis not present

## 2018-05-12 DIAGNOSIS — Z992 Dependence on renal dialysis: Secondary | ICD-10-CM | POA: Diagnosis not present

## 2018-05-12 DIAGNOSIS — N186 End stage renal disease: Secondary | ICD-10-CM | POA: Diagnosis not present

## 2018-05-13 ENCOUNTER — Ambulatory Visit (HOSPITAL_COMMUNITY)
Admission: RE | Admit: 2018-05-13 | Discharge: 2018-05-13 | Disposition: A | Discharge status: Home / Self Care | Payer: Medicare Other | Source: Ambulatory Visit | Attending: Internal Medicine | Admitting: Internal Medicine

## 2018-05-13 DIAGNOSIS — M4802 Spinal stenosis, cervical region: Secondary | ICD-10-CM | POA: Diagnosis not present

## 2018-05-13 DIAGNOSIS — M5412 Radiculopathy, cervical region: Secondary | ICD-10-CM | POA: Insufficient documentation

## 2018-05-13 DIAGNOSIS — M8938 Hypertrophy of bone, other site: Secondary | ICD-10-CM | POA: Insufficient documentation

## 2018-05-13 DIAGNOSIS — M4322 Fusion of spine, cervical region: Secondary | ICD-10-CM | POA: Diagnosis not present

## 2018-05-14 ENCOUNTER — Other Ambulatory Visit: Payer: Self-pay | Admitting: Internal Medicine

## 2018-05-14 DIAGNOSIS — D631 Anemia in chronic kidney disease: Secondary | ICD-10-CM | POA: Diagnosis not present

## 2018-05-14 DIAGNOSIS — M5412 Radiculopathy, cervical region: Secondary | ICD-10-CM

## 2018-05-14 DIAGNOSIS — Z992 Dependence on renal dialysis: Secondary | ICD-10-CM | POA: Diagnosis not present

## 2018-05-14 DIAGNOSIS — N2581 Secondary hyperparathyroidism of renal origin: Secondary | ICD-10-CM | POA: Diagnosis not present

## 2018-05-14 DIAGNOSIS — Z23 Encounter for immunization: Secondary | ICD-10-CM | POA: Diagnosis not present

## 2018-05-14 DIAGNOSIS — N186 End stage renal disease: Secondary | ICD-10-CM | POA: Diagnosis not present

## 2018-05-17 ENCOUNTER — Telehealth: Payer: Self-pay | Admitting: *Deleted

## 2018-05-17 DIAGNOSIS — Z992 Dependence on renal dialysis: Secondary | ICD-10-CM | POA: Diagnosis not present

## 2018-05-17 DIAGNOSIS — D631 Anemia in chronic kidney disease: Secondary | ICD-10-CM | POA: Diagnosis not present

## 2018-05-17 DIAGNOSIS — N2581 Secondary hyperparathyroidism of renal origin: Secondary | ICD-10-CM | POA: Diagnosis not present

## 2018-05-17 DIAGNOSIS — N186 End stage renal disease: Secondary | ICD-10-CM | POA: Diagnosis not present

## 2018-05-17 DIAGNOSIS — Z23 Encounter for immunization: Secondary | ICD-10-CM | POA: Diagnosis not present

## 2018-05-17 NOTE — Progress Notes (Signed)
[facility] [address] [place], [place] [address] Phone: [phone] Subjective:   [name], am serving as a scribe for Dr. Hulan Saas.   CC: Left shoulder pain  TWS:[id number]  Melody [name] is a 42 y.o. female coming in with complaint of left shoulder pain for 2 months. Patient has pain in back of shoulder but that radiates down into her arm. She said that driving increases her pain. Pain is decreased with placing her arm on top of her head. She has tingling in 2nd-5th fingers. Has trouble sleeping in that side. Has used Tylenol and flexeril. 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  Rates his severity pain is 7 out of 10 and waking her up at night.      Past Medical History:  Diagnosis Date  . Alcohol abuse   . Chronic kidney disease    staqge 5  . Drug abuse ([place])   . Ectopic pregnancy   . Headache    Migraines  . Hypertension Dx May 2016  . Tobacco abuse    Past Surgical History:  Procedure Laterality Date  . AV FISTULA PLACEMENT Right 09/15/2016   Procedure: ARTERIOVENOUS (AV) FISTULA CREATION UPPER RIGHT ARM;  Surgeon: Rosetta Posner, MD;  Location: [facility] OR;  Service: Vascular;  Laterality: Right;  . AV FISTULA PLACEMENT Left 12/30/2016   Procedure: ARTERIOVENOUS (AV) FISTULA CREATION USING GORETEX STRETCH GRAFT;  Surgeon: Elam Dutch, MD;  Location: [facility] OR;  Service: Vascular;  Laterality: Left;  . AV FISTULA PLACEMENT Left 06/15/2017   Procedure: INSERTION OF ARTERIOVENOUS (AV) GORE-TEX GRAFT 6MM X 40CM LEFT ARM;  Surgeon: Rosetta Posner, MD;  Location: [place];  Service: Vascular;  Laterality: Left;  . DILATION AND CURETTAGE OF UTERUS    . INSERTION OF DIALYSIS CATHETER Right 09/15/2016   Procedure: INSERTION OF DIALYSIS CATHETER ON RIGHT SIDE;  Surgeon: Rosetta Posner, MD;  Location: [facility] OR;  Service: Vascular;  Laterality: Right;   Social History   Socioeconomic History  . Marital status: Single    Spouse  name: Not on file  . Number of children: 2   . Years of education: some colle  . Highest education level: Not on file  Occupational History  . Not on file  Social Needs  . Financial resource strain: Not on file  . Food insecurity:    Worry: Not on file    Inability: Not on file  . Transportation needs:    Medical: Not on file    Non-medical: Not on file  Tobacco Use  . Smoking status: Former Smoker    Packs/day: 0.00    Years: 0.00    Pack years: 0.00    Types: Cigars, Cigarettes    Start date: 09/11/2016  . Smokeless tobacco: Never Used  Substance and Sexual Activity  . Alcohol use: Yes    Alcohol/week: 0.0 standard drinks    Comment: social  . Drug use: Yes    Types: Marijuana  . Sexual activity: Yes    Birth control/protection: None  Lifestyle  . Physical activity:    Days per week: Not on file    Minutes per session: Not on file  . Stress: Not on file  Relationships  . Social connections:    Talks on phone: Not on file    Gets together: Not on file    Attends religious service: Not on file  Active member of club or organization: Not on file    Attends meetings of clubs or organizations: Not on file    Relationship status: Not on file  Other Topics Concern  . Not on file  Social History Narrative   Looking for work    24 and 13 yr son's.    3 grandkids    Allergies  Allergen Reactions  . Acetaminophen     Tylenol #3 Causes nightmares  . Codeine Other (See Comments)  . Tramadol Hives   Family History  Problem Relation Age of Onset  . CAD Mother   . Hypertension Mother   . Heart disease Mother   . Kidney failure Mother   . CAD Brother   . Hypertension Other   . Kidney failure Other   . Hypertension Maternal Grandmother   . Kidney failure Maternal Grandmother   . Congestive Heart Failure Maternal Grandmother      Current Outpatient Medications (Cardiovascular):  .  amLODipine (NORVASC) 10 MG tablet, Take 1 tablet (10 mg total) by mouth at  bedtime. Marland Kitchen  labetalol (NORMODYNE) 300 MG tablet, Take 1 tablet (300 mg total) by mouth 2 (two) times daily. Marland Kitchen  lisinopril (PRINIVIL,ZESTRIL) 30 MG tablet, Take 1 tablet (30 mg total) by mouth at bedtime.   Current Outpatient Medications (Analgesics):  .  acetaminophen (TYLENOL) 500 MG tablet, Take 1,000 mg by mouth every 6 (six) hours as needed for mild pain or headache.   Current Outpatient Medications (Other):  Marland Kitchen  B Complex-C-Folic Acid (RENA-VITE RX) 1 MG TABS, Take by mouth. .  calcium acetate (PHOSLO) 667 MG capsule, Take 2 capsules (1,334 mg total) by mouth 3 (three) times daily with meals. .  cyclobenzaprine (FLEXERIL) 5 MG tablet, Take 1 tablet (5 mg total) by mouth 2 (two) times daily as needed for muscle spasms. Marland Kitchen  lidocaine-prilocaine (EMLA) cream, Apply 1 application topically daily as needed for irritation. .  multivitamin (RENA-VIT) TABS tablet, Take 1 tablet by mouth daily. Marland Kitchen  gabapentin (NEURONTIN) 300 MG capsule, nightly    Past medical history, social, surgical and family history all reviewed in electronic medical record.  No pertanent information unless stated regarding to the chief complaint.   Review of Systems:  No headache, visual changes, nausea, vomiting, diarrhea, constipation, dizziness, abdominal pain, skin rash, fevers, chills, night sweats, weight loss, swollen lymph nodes, body aches, joint swelling, muscle aches, chest pain, shortness of breath, mood changes.   Objective  Blood pressure (!) 128/92, pulse 84, height 5\' 7"  (1.702 m), weight 162 lb (73.5 kg), SpO2 98 %.    General: No apparent distress alert and oriented x3 mood and affect normal, dressed appropriately.  HEENT: Pupils equal, extraocular movements intact  Respiratory: Patient's speak in full sentences and does not appear short of breath  Cardiovascular: No lower extremity edema, non tender, no erythema  Skin: Warm dry intact with no signs of infection or rash on extremities or on axial  skeleton.  Abdomen: Soft nontender  Neuro: Cranial nerves II through XII are intact, neurovascularly intact in all extremities with 2+ DTRs and 2+ pulses.  Lymph: No lymphadenopathy of posterior or anterior cervical chain or axillae bilaterally.  Gait normal with good balance and coordination.  MSK:  Non tender with full range of motion and good stability and symmetric strength and tone of  elbows, wrist, hip, knee and ankles bilaterally.  Neck: Inspection unremarkable. No palpable stepoffs. Positive Spurling's maneuver. Loss of left-sided sidebending and rotation by 10 degrees  Grip strength and sensation normal in bilateral hands Strength good C4 to T1 distribution No sensory change to C4 to T1 Negative Hoffman sign bilaterally Reflexes normal Tightness in the trapezius on the left  Shoulder: left Inspection reveals no abnormalities, atrophy or asymmetry. Palpation is normal with no tenderness over AC joint or bicipital groove. ROM is full in all planes passively. Rotator cuff strength normal throughout. signs of impingement with positive Neer and Hawkin's tests, but negative empty can sign. Speeds and Yergason's tests normal. No labral pathology noted with negative Obrien's, negative clunk and good stability. Normal scapular function observed. No painful arc and no drop arm sign. No apprehension sign Contralateral shoulder unremarkable  MSK US performed of: left This study was ordered, performed, and interpreted by Charlann Boxer D.O.  Shoulder:   Supraspinatus:  Appears normal on long and transverse views, Bursal bulge seen with shoulder abduction on impingement view. Infraspinatus:  Appears normal on long and transverse views. Significant increase in Doppler flow Subscapularis:  Appears normal on long and transverse views. Positive bursa Teres Minor:  Appears normal on long and transverse views. AC joint:  Capsule undistended, no geyser sign. Glenohumeral Joint:  Appears  normal without effusion. Glenoid Labrum:  Intact without visualized tears. Biceps Tendon:  Appears normal on long and transverse views, no fraying of tendon, tendon located in intertubercular groove, no subluxation with shoulder internal or external rotation.  Impression: Subacromial bursitis  Procedure: Real-time Ultrasound Guided Injection of left glenohumeral joint Device: GE Logiq E  Ultrasound guided injection is preferred based studies that show increased duration, increased effect, greater accuracy, decreased procedural pain, increased response rate with ultrasound guided versus blind injection.  Verbal informed consent obtained.  Time-out conducted.  Noted no overlying erythema, induration, or other signs of local infection.  Skin prepped in a sterile fashion.  Local anesthesia: Topical Ethyl chloride.  With sterile technique and under real time ultrasound guidance:  Joint visualized.  23g 1  inch needle inserted posterior approach. Pictures taken for needle placement. Patient did have injection of 2 cc of 1% lidocaine, 2 cc of 0.5% Marcaine, and 1.0 cc of Kenalog 40 mg/dL. Completed without difficulty  Pain immediately resolved suggesting accurate placement of the medication.  Advised to call if fevers/chills, erythema, induration, drainage, or persistent bleeding.  Images permanently stored and available for review in the ultrasound unit.  Impression: Technically successful ultrasound guided injection.      Impression and Recommendations:     This case required medical decision making of moderate complexity. The above documentation has been reviewed and is accurate and complete Lyndal Pulley, DO       Note: This dictation was prepared with Dragon dictation along with smaller phrase technology. Any transcriptional errors that result from this process are unintentional.

## 2018-05-17 NOTE — Telephone Encounter (Signed)
Left message on voicemail to return call:    Notes recorded by Ladell Pier, MD on 05/14/2018 at 8:26 AM EDT Let patient know that MRI of the neck revealed some narrowing of vertebral bone around the spinal cord. This likely is causing her symptoms. We will refer her to a neurosurgeon for further evaluation.

## 2018-05-18 ENCOUNTER — Ambulatory Visit (INDEPENDENT_AMBULATORY_CARE_PROVIDER_SITE_OTHER): Payer: Medicare Other | Admitting: Family Medicine

## 2018-05-18 ENCOUNTER — Ambulatory Visit: Payer: Self-pay

## 2018-05-18 ENCOUNTER — Encounter: Payer: Self-pay | Admitting: Family Medicine

## 2018-05-18 VITALS — BP 128/92 | HR 84 | Ht 67.0 in | Wt 162.0 lb

## 2018-05-18 DIAGNOSIS — M25512 Pain in left shoulder: Secondary | ICD-10-CM

## 2018-05-18 DIAGNOSIS — G8929 Other chronic pain: Secondary | ICD-10-CM

## 2018-05-18 DIAGNOSIS — M5412 Radiculopathy, cervical region: Secondary | ICD-10-CM | POA: Diagnosis not present

## 2018-05-18 DIAGNOSIS — M7552 Bursitis of left shoulder: Secondary | ICD-10-CM | POA: Diagnosis not present

## 2018-05-18 MED ORDER — GABAPENTIN 300 MG PO CAPS: ORAL_CAPSULE | ORAL | 3 refills | Status: DC

## 2018-05-18 NOTE — Assessment & Plan Note (Signed)
Patient was given injection.  Discussed icing regimen and home exercises.  Topical anti-inflammatories and home exercises given. I do believe that most of patient's pain is secondary to a cervical radiculopathy.  The MRI of the neck was independently visualized by me.  Patient if not making significant improvement will have epidural.  Patient is in agreement with the plan.

## 2018-05-18 NOTE — Patient Instructions (Addendum)
Good to see you.  Ice 20 minutes 2 times daily. Usually after activity and before bed. Exercises 3 times a week.  Injected the shoulder today to see how it helps pennsaid pinkie amount topically 2 times daily as needed.  Gabapentin 300mg  at night Keep hands within peripheral vison  If not better in 2 weeks call and we will order epidural of the neck  Otherwise follow up with me again in 5-6 weeks

## 2018-05-18 NOTE — Assessment & Plan Note (Signed)
Cervical radiculopathy.  I do believe is contributing.  Positive Spurling's.  Likely no weakness.  His MRI is consistent with patient's physical findings.  Patient may need an epidural.  Patient will try gabapentin and home exercises.  Follow-up again in 4 to 6 weeks

## 2018-05-19 DIAGNOSIS — N186 End stage renal disease: Secondary | ICD-10-CM | POA: Diagnosis not present

## 2018-05-19 DIAGNOSIS — Z992 Dependence on renal dialysis: Secondary | ICD-10-CM | POA: Diagnosis not present

## 2018-05-19 DIAGNOSIS — Z23 Encounter for immunization: Secondary | ICD-10-CM | POA: Diagnosis not present

## 2018-05-19 DIAGNOSIS — N2581 Secondary hyperparathyroidism of renal origin: Secondary | ICD-10-CM | POA: Diagnosis not present

## 2018-05-19 DIAGNOSIS — D631 Anemia in chronic kidney disease: Secondary | ICD-10-CM | POA: Diagnosis not present

## 2018-05-21 DIAGNOSIS — N186 End stage renal disease: Secondary | ICD-10-CM | POA: Diagnosis not present

## 2018-05-21 DIAGNOSIS — Z992 Dependence on renal dialysis: Secondary | ICD-10-CM | POA: Diagnosis not present

## 2018-05-21 DIAGNOSIS — T82868A Thrombosis of vascular prosthetic devices, implants and grafts, initial encounter: Secondary | ICD-10-CM | POA: Diagnosis not present

## 2018-05-21 DIAGNOSIS — Z23 Encounter for immunization: Secondary | ICD-10-CM | POA: Diagnosis not present

## 2018-05-21 DIAGNOSIS — I871 Compression of vein: Secondary | ICD-10-CM | POA: Diagnosis not present

## 2018-05-21 DIAGNOSIS — N2581 Secondary hyperparathyroidism of renal origin: Secondary | ICD-10-CM | POA: Diagnosis not present

## 2018-05-21 DIAGNOSIS — D631 Anemia in chronic kidney disease: Secondary | ICD-10-CM | POA: Diagnosis not present

## 2018-05-24 DIAGNOSIS — Z23 Encounter for immunization: Secondary | ICD-10-CM | POA: Diagnosis not present

## 2018-05-24 DIAGNOSIS — N2581 Secondary hyperparathyroidism of renal origin: Secondary | ICD-10-CM | POA: Diagnosis not present

## 2018-05-24 DIAGNOSIS — Z992 Dependence on renal dialysis: Secondary | ICD-10-CM | POA: Diagnosis not present

## 2018-05-24 DIAGNOSIS — N186 End stage renal disease: Secondary | ICD-10-CM | POA: Diagnosis not present

## 2018-05-24 DIAGNOSIS — D631 Anemia in chronic kidney disease: Secondary | ICD-10-CM | POA: Diagnosis not present

## 2018-05-25 ENCOUNTER — Ambulatory Visit (INDEPENDENT_AMBULATORY_CARE_PROVIDER_SITE_OTHER): Payer: Medicare Other | Admitting: Orthopaedic Surgery

## 2018-05-25 DIAGNOSIS — N186 End stage renal disease: Secondary | ICD-10-CM | POA: Diagnosis not present

## 2018-05-25 DIAGNOSIS — Z992 Dependence on renal dialysis: Secondary | ICD-10-CM | POA: Diagnosis not present

## 2018-05-25 DIAGNOSIS — I129 Hypertensive chronic kidney disease with stage 1 through stage 4 chronic kidney disease, or unspecified chronic kidney disease: Secondary | ICD-10-CM | POA: Diagnosis not present

## 2018-05-26 DIAGNOSIS — D631 Anemia in chronic kidney disease: Secondary | ICD-10-CM | POA: Diagnosis not present

## 2018-05-26 DIAGNOSIS — Z23 Encounter for immunization: Secondary | ICD-10-CM | POA: Diagnosis not present

## 2018-05-26 DIAGNOSIS — N186 End stage renal disease: Secondary | ICD-10-CM | POA: Diagnosis not present

## 2018-05-26 DIAGNOSIS — N2581 Secondary hyperparathyroidism of renal origin: Secondary | ICD-10-CM | POA: Diagnosis not present

## 2018-05-28 DIAGNOSIS — N186 End stage renal disease: Secondary | ICD-10-CM | POA: Diagnosis not present

## 2018-05-28 DIAGNOSIS — N2581 Secondary hyperparathyroidism of renal origin: Secondary | ICD-10-CM | POA: Diagnosis not present

## 2018-05-28 DIAGNOSIS — Z23 Encounter for immunization: Secondary | ICD-10-CM | POA: Diagnosis not present

## 2018-05-28 DIAGNOSIS — D631 Anemia in chronic kidney disease: Secondary | ICD-10-CM | POA: Diagnosis not present

## 2018-05-31 DIAGNOSIS — N186 End stage renal disease: Secondary | ICD-10-CM | POA: Diagnosis not present

## 2018-05-31 DIAGNOSIS — D631 Anemia in chronic kidney disease: Secondary | ICD-10-CM | POA: Diagnosis not present

## 2018-05-31 DIAGNOSIS — Z23 Encounter for immunization: Secondary | ICD-10-CM | POA: Diagnosis not present

## 2018-05-31 DIAGNOSIS — N2581 Secondary hyperparathyroidism of renal origin: Secondary | ICD-10-CM | POA: Diagnosis not present

## 2018-06-01 ENCOUNTER — Encounter: Payer: Self-pay | Admitting: Endocrinology

## 2018-06-01 ENCOUNTER — Ambulatory Visit (INDEPENDENT_AMBULATORY_CARE_PROVIDER_SITE_OTHER): Payer: Medicare Other | Admitting: Endocrinology

## 2018-06-01 ENCOUNTER — Other Ambulatory Visit: Payer: Self-pay | Admitting: Endocrinology

## 2018-06-01 VITALS — BP 124/86 | HR 79 | Ht 67.0 in | Wt 167.0 lb

## 2018-06-01 DIAGNOSIS — E042 Nontoxic multinodular goiter: Secondary | ICD-10-CM | POA: Diagnosis not present

## 2018-06-01 DIAGNOSIS — I871 Compression of vein: Secondary | ICD-10-CM | POA: Diagnosis not present

## 2018-06-01 DIAGNOSIS — T82868A Thrombosis of vascular prosthetic devices, implants and grafts, initial encounter: Secondary | ICD-10-CM | POA: Diagnosis not present

## 2018-06-01 DIAGNOSIS — N186 End stage renal disease: Secondary | ICD-10-CM | POA: Diagnosis not present

## 2018-06-01 DIAGNOSIS — Z992 Dependence on renal dialysis: Secondary | ICD-10-CM | POA: Diagnosis not present

## 2018-06-01 NOTE — Patient Instructions (Signed)
Let's check the biopsy, guided by the ultrasound.  you will receive a phone call, about a day and time for an appointment. If no cancer is found, Please come back for a follow-up appointment in 6 months

## 2018-06-01 NOTE — Progress Notes (Signed)
Subjective:    Patient ID: Melody [name], female    DOB: 06/04/1976, 42 y.o.   MRN: [id number]  HPI Pt is referred by Dr Wynetta Emery, for nodular thyroid.  Pt was noted to have a thyroid nodule in mid-2019.  she is unaware of ever having had thyroid problems in the past.  she has no h/o XRT or surgery to the neck.  She has slight choking sensation at the ant neck, but no assoc pain.   Past Medical History:  Diagnosis Date  . Alcohol abuse   . Chronic kidney disease    staqge 5  . Drug abuse ([place])   . Ectopic pregnancy   . Headache    Migraines  . Hypertension Dx May 2016  . Tobacco abuse     Past Surgical History:  Procedure Laterality Date  . AV FISTULA PLACEMENT Right 09/15/2016   Procedure: ARTERIOVENOUS (AV) FISTULA CREATION UPPER RIGHT ARM;  Surgeon: Rosetta Posner, MD;  Location[facility] OR;  Service: Vascular;  Laterality: Right;  . AV FISTULA PLACEMENT Left 12/30/2016   Procedure: ARTERIOVENOUS (AV) FISTULA CREATION USING GORETEX STRETCH GRAFT;  Surgeon: Elam Dutch, MD;  Location: [facility] OR;  Service: Vascular;  Laterality: Left;  . AV FISTULA PLACEMENT Left 06/15/2017   Procedure: INSERTION OF ARTERIOVENOUS (AV) GORE-TEX GRAFT 6MM X 40CM LEFT ARM;  Surgeon: Rosetta Posner, MD;  Location: [place];  Service: Vascular;  Laterality: Left;  . DILATION AND CURETTAGE OF UTERUS    . INSERTION OF DIALYSIS CATHETER Right 09/15/2016   Procedure: INSERTION OF DIALYSIS CATHETER ON RIGHT SIDE;  Surgeon: Rosetta Posner, MD;  Location: [facility] OR;  Service: Vascular;  Laterality: Right;    Social History   Socioeconomic History  . Marital status: Single    Spouse name: Not on file  . Number of children: 2   . Years of education: some colle  . Highest education level: Not on file  Occupational History  . Not on file  Social Needs  . Financial resource strain: Not on file  . Food insecurity:    Worry: Not on file    Inability: Not on file  . Transportation needs:    Medical: Not on file   Non-medical: Not on file  Tobacco Use  . Smoking status: Former Smoker    Packs/day: 0.00    Years: 0.00    Pack years: 0.00    Types: Cigars, Cigarettes    Start date: 09/11/2016  . Smokeless tobacco: Never Used  Substance and Sexual Activity  . Alcohol use: Yes    Alcohol/week: 0.0 standard drinks    Comment: social  . Drug use: Yes    Types: Marijuana  . Sexual activity: Yes    Birth control/protection: None  Lifestyle  . Physical activity:    Days per week: Not on file    Minutes per session: Not on file  . Stress: Not on file  Relationships  . Social connections:    Talks on phone: Not on file    Gets together: Not on file    Attends religious service: Not on file    Active member of club or organization: Not on file    Attends meetings of clubs or organizations: Not on file    Relationship status: Not on file  . Intimate partner violence:    Fear of current or ex partner: Not on file    Emotionally abused: Not on file    Physically abused: Not  on file    Forced sexual activity: Not on file  Other Topics Concern  . Not on file  Social History Narrative   Looking for work    24 and 13 yr son's.    3 grandkids     Current Outpatient Medications on File Prior to Visit  Medication Sig Dispense Refill  . acetaminophen (TYLENOL) 500 MG tablet Take 1,000 mg by mouth every 6 (six) hours as needed for mild pain or headache.    Marland Kitchen amLODipine (NORVASC) 10 MG tablet Take 1 tablet (10 mg total) by mouth at bedtime. 30 tablet 6  . calcium acetate (PHOSLO) 667 MG capsule Take 2 capsules (1,334 mg total) by mouth 3 (three) times daily with meals. 180 capsule 0  . cyclobenzaprine (FLEXERIL) 5 MG tablet Take 1 tablet (5 mg total) by mouth 2 (two) times daily as needed for muscle spasms. 60 tablet 1  . labetalol (NORMODYNE) 300 MG tablet Take 1 tablet (300 mg total) by mouth 2 (two) times daily. 180 tablet 3  . lidocaine-prilocaine (EMLA) cream Apply 1 application topically daily as  needed for irritation.  0  . lisinopril (PRINIVIL,ZESTRIL) 30 MG tablet Take 1 tablet (30 mg total) by mouth at bedtime. 90 tablet 3  . multivitamin (RENA-VIT) TABS tablet Take 1 tablet by mouth daily.  0  . gabapentin (NEURONTIN) 300 MG capsule nightly (Patient not taking: Reported on 06/01/2018) 30 capsule 3   No current facility-administered medications on file prior to visit.     Allergies  Allergen Reactions  . Acetaminophen     Tylenol #3 Causes nightmares  . Codeine Other (See Comments)  . Tramadol Hives    Family History  Problem Relation Age of Onset  . CAD Mother   . Hypertension Mother   . Heart disease Mother   . Kidney failure Mother   . CAD Brother   . Hypertension Other   . Kidney failure Other   . Hypertension Maternal Grandmother   . Kidney failure Maternal Grandmother   . Congestive Heart Failure Maternal Grandmother   . Thyroid disease Neg Hx     BP 124/86 (BP Location: Right Arm)   Pulse 79   Ht 5\' 7"  (1.702 m)   Wt 167 lb (75.8 kg)   SpO2 99%   BMI 26.16 kg/m     Review of Systems Denies weight change, hoarseness, visual loss, chest pain, sob, cough, dysphagia, diarrhea, itching, flushing, easy bruising, depression, cold intolerance, numbness, and rhinorrhea.  She has intermitt headache.       Objective:   Physical Exam VS: see vs page GEN: no distress HEAD: head: no deformity eyes: no periorbital swelling, no proptosis.   external nose and ears are normal mouth: no lesion seen NECK: I can palpate the right thyroid nodule easily.  I can feel what is probably the left nodule, but I am uncertain CHEST WALL: no deformity LUNGS: clear to auscultation CV: reg rate and rhythm, no murmur ABD: abdomen is soft, nontender.  no hepatosplenomegaly.  not distended.  no hernia MUSCULOSKELETAL: muscle bulk and strength are grossly normal.  no obvious joint swelling.  gait is normal and steady EXTEMITIES: no deformity.  no edema PULSES: no carotid  bruit NEURO:  cn 2-12 grossly intact.   readily moves all 4's.  sensation is intact to touch on all 4's SKIN:  Normal texture and temperature.  No rash or suspicious lesion is visible.   NODES:  None palpable at the neck PSYCH:  alert, well-oriented.  Does not appear anxious nor depressed.   Lab Results  Component Value Date   TSH 1.340 04/28/2018   I have reviewed outside records, and summarized: Pt was noted to have multinodular goiter, and referred here.  The goiter was incidentally noted on a routine phys exam  [place]: Nodules 2 and 4 meet criteria for fine needle aspiration biopsy.    Assessment & Plan:  Multinodular goiter, new to me  Patient Instructions  Let's check the biopsy, guided by the ultrasound.  you will receive a phone call, about a day and time for an appointment. If no cancer is found, Please come back for a follow-up appointment in 6 months

## 2018-06-02 DIAGNOSIS — D631 Anemia in chronic kidney disease: Secondary | ICD-10-CM | POA: Diagnosis not present

## 2018-06-02 DIAGNOSIS — Z23 Encounter for immunization: Secondary | ICD-10-CM | POA: Diagnosis not present

## 2018-06-02 DIAGNOSIS — N186 End stage renal disease: Secondary | ICD-10-CM | POA: Diagnosis not present

## 2018-06-02 DIAGNOSIS — N2581 Secondary hyperparathyroidism of renal origin: Secondary | ICD-10-CM | POA: Diagnosis not present

## 2018-06-04 DIAGNOSIS — D631 Anemia in chronic kidney disease: Secondary | ICD-10-CM | POA: Diagnosis not present

## 2018-06-04 DIAGNOSIS — Z23 Encounter for immunization: Secondary | ICD-10-CM | POA: Diagnosis not present

## 2018-06-04 DIAGNOSIS — N186 End stage renal disease: Secondary | ICD-10-CM | POA: Diagnosis not present

## 2018-06-04 DIAGNOSIS — N2581 Secondary hyperparathyroidism of renal origin: Secondary | ICD-10-CM | POA: Diagnosis not present

## 2018-06-07 DIAGNOSIS — N2581 Secondary hyperparathyroidism of renal origin: Secondary | ICD-10-CM | POA: Diagnosis not present

## 2018-06-07 DIAGNOSIS — N186 End stage renal disease: Secondary | ICD-10-CM | POA: Diagnosis not present

## 2018-06-07 DIAGNOSIS — D631 Anemia in chronic kidney disease: Secondary | ICD-10-CM | POA: Diagnosis not present

## 2018-06-07 DIAGNOSIS — Z23 Encounter for immunization: Secondary | ICD-10-CM | POA: Diagnosis not present

## 2018-06-09 DIAGNOSIS — D631 Anemia in chronic kidney disease: Secondary | ICD-10-CM | POA: Diagnosis not present

## 2018-06-09 DIAGNOSIS — N186 End stage renal disease: Secondary | ICD-10-CM | POA: Diagnosis not present

## 2018-06-09 DIAGNOSIS — Z23 Encounter for immunization: Secondary | ICD-10-CM | POA: Diagnosis not present

## 2018-06-09 DIAGNOSIS — N2581 Secondary hyperparathyroidism of renal origin: Secondary | ICD-10-CM | POA: Diagnosis not present

## 2018-06-11 DIAGNOSIS — Z23 Encounter for immunization: Secondary | ICD-10-CM | POA: Diagnosis not present

## 2018-06-11 DIAGNOSIS — N186 End stage renal disease: Secondary | ICD-10-CM | POA: Diagnosis not present

## 2018-06-11 DIAGNOSIS — N2581 Secondary hyperparathyroidism of renal origin: Secondary | ICD-10-CM | POA: Diagnosis not present

## 2018-06-11 DIAGNOSIS — D631 Anemia in chronic kidney disease: Secondary | ICD-10-CM | POA: Diagnosis not present

## 2018-06-14 DIAGNOSIS — Z23 Encounter for immunization: Secondary | ICD-10-CM | POA: Diagnosis not present

## 2018-06-14 DIAGNOSIS — N186 End stage renal disease: Secondary | ICD-10-CM | POA: Diagnosis not present

## 2018-06-14 DIAGNOSIS — D631 Anemia in chronic kidney disease: Secondary | ICD-10-CM | POA: Diagnosis not present

## 2018-06-14 DIAGNOSIS — N2581 Secondary hyperparathyroidism of renal origin: Secondary | ICD-10-CM | POA: Diagnosis not present

## 2018-06-15 ENCOUNTER — Encounter (INDEPENDENT_AMBULATORY_CARE_PROVIDER_SITE_OTHER): Payer: Self-pay | Admitting: Orthopaedic Surgery

## 2018-06-15 ENCOUNTER — Ambulatory Visit (INDEPENDENT_AMBULATORY_CARE_PROVIDER_SITE_OTHER): Payer: Medicare Other | Admitting: Orthopaedic Surgery

## 2018-06-15 VITALS — BP 154/111 | HR 77 | Ht 67.0 in | Wt 167.0 lb

## 2018-06-15 DIAGNOSIS — M47812 Spondylosis without myelopathy or radiculopathy, cervical region: Secondary | ICD-10-CM

## 2018-06-15 NOTE — Progress Notes (Signed)
Office Visit Note   Patient: Melody [name]           Date of Birth: October 01, 1975           MRN: [id number] Visit Date: 06/15/2018              Requested by: Donato Heinz, MD [id number] [place], [place] [address] PCP: Ladell Pier, MD   Assessment & Plan: Visit Diagnoses:  1. Spondylosis without myelopathy or radiculopathy, cervical region     Plan: MRI scan is reviewed with patient I gave her a copy of the report.  She has moderate to severe spondylosis on the left with foraminal narrowing.  Mild narrowing on the left at C6-7.  No central stenosis.  Patient got good relief with the injection 3 weeks ago is now asymptomatic essentially.  She can return on an as-needed basis.  We discussed symptoms and findings that would be of concern.  Follow-Up Instructions: Return if symptoms worsen or fail to improve.   Orders:  No orders of the defined types were placed in this encounter.  No orders of the defined types were placed in this encounter.     Procedures: No procedures performed   Clinical Data: No additional findings.   Subjective: Chief Complaint  Patient presents with  . Left Arm - Pain    HPI 42 year old female on dialysis x1 year 9 months secondary to hypertension had had several month history of neck and left arm pain.  She had a subacromial injection in her shoulder 3 weeks ago and states since that time she is had no pain and is had greater than 80% pain relief.  Pain was excruciating now she states is minimal.  She has no problems sleeping no pain with activities of daily living.  No numbness or tingling in her hand.  Review of Systems renal failure now times almost 2 years.  She is on hemodialysis and has a catheter right subclavian.  Previous fistula left upper arm which clotted off.  Past history of drug abuse, ectopic pregnancy, alcohol abuse, headache, hypertension, tobacco abuse.  14 point review of systems otherwise negative is a pertains  HPI.   Objective: Vital Signs: BP (!) 154/111   Pulse 77   Ht 5\' 7"  (1.702 m)   Wt 167 lb (75.8 kg)   BMI 26.16 kg/m   Physical Exam  Constitutional: She is oriented to person, place, and time. She appears well-developed.  HENT:  Head: Normocephalic.  Right Ear: External ear normal.  Left Ear: External ear normal.  Eyes: Pupils are equal, round, and reactive to light.  Neck: No tracheal deviation present. No thyromegaly present.  Cardiovascular: Normal rate.  Dialysis catheter right subclavian with dressing  Pulmonary/Chest: Effort normal.  Abdominal: Soft.  Neurological: She is alert and oriented to person, place, and time.  Skin: Skin is warm and dry.  Psychiatric: She has a normal mood and affect. Her behavior is normal.    Ortho Exam intact upper extremity reflexes full range of motion of the shoulder no numbness or tingling in her hand.  Good cervical range of motion.  Specialty Comments:  No specialty comments available.  Imaging:CLINICAL DATA:  Cervical radiculopathy.  EXAM: MRI CERVICAL SPINE WITHOUT CONTRAST  TECHNIQUE: Multiplanar, multisequence MR imaging of the cervical spine was performed. No intravenous contrast was administered.  COMPARISON:  Cervical spine radiograph 05/04/2018  FINDINGS: Alignment: There is reversal of normal cervical lordosis. No static subluxation.  Vertebrae: No fracture, evidence  of discitis, or bone lesion.  Cord: Normal signal and morphology.  Posterior Fossa, vertebral arteries, paraspinal tissues: Visualized posterior fossa is normal. Vertebral artery flow voids are preserved. No prevertebral soft tissue swelling.  Disc levels:  The C1-2 articulation is normal.  C2-C3: Normal.  C3-C4: Mild disc space narrowing without spinal canal or neural foraminal stenosis.  C4-C5: Normal.  C5-C6: Anterior osteophyte formation and left-greater-than-right uncovertebral hypertrophy. There is moderate-to-severe  left neural foraminal stenosis. No spinal canal or right foraminal stenosis.  C6-C7: Mild bilateral uncovertebral hypertrophy. Mild left foraminal stenosis. No spinal canal stenosis.  C7-T1: Normal.  T1-T2: Normal.  T2-T4 levels are imaged in sagittal plane only, but are normal.  IMPRESSION: 1. Moderate-to-severe left C5-6 and mild left C6-7 neural foraminal stenosis secondary to uncovertebral hypertrophy. 2. No cervical spinal canal stenosis.   Electronically Signed   By: Ulyses Jarred M.D.   On: 05/13/2018 19:17    PMFS History: Patient Active Problem List   Diagnosis Date Noted  . Acute bursitis of left shoulder 05/18/2018  . Marijuana use, continuous 05/04/2018  . Multiple thyroid nodules 05/04/2018  . Cervical radiculopathy 05/04/2018  . ESRD (end stage renal disease) on dialysis ([place]) 09/29/2017  . Former tobacco use 09/29/2017  . Anemia of chronic disease   . Drug abuse ([place])   . Essential hypertension    Past Medical History:  Diagnosis Date  . Alcohol abuse   . Chronic kidney disease    staqge 5  . Drug abuse ([place])   . Ectopic pregnancy   . Headache    Migraines  . Hypertension Dx May 2016  . Tobacco abuse     Family History  Problem Relation Age of Onset  . CAD Mother   . Hypertension Mother   . Heart disease Mother   . Kidney failure Mother   . CAD Brother   . Hypertension Other   . Kidney failure Other   . Hypertension Maternal Grandmother   . Kidney failure Maternal Grandmother   . Congestive Heart Failure Maternal Grandmother   . Thyroid disease Neg Hx     Past Surgical History:  Procedure Laterality Date  . AV FISTULA PLACEMENT Right 09/15/2016   Procedure: ARTERIOVENOUS (AV) FISTULA CREATION UPPER RIGHT ARM;  Surgeon: Rosetta Posner, MD;  Location: [facility] OR;  Service: Vascular;  Laterality: Right;  . AV FISTULA PLACEMENT Left 12/30/2016   Procedure: ARTERIOVENOUS (AV) FISTULA CREATION USING GORETEX STRETCH GRAFT;  Surgeon: Elam Dutch, MD;  Location[facility] OR;  Service: Vascular;  Laterality: Left;  . AV FISTULA PLACEMENT Left 06/15/2017   Procedure: INSERTION OF ARTERIOVENOUS (AV) GORE-TEX GRAFT 6MM X 40CM LEFT ARM;  Surgeon: Rosetta Posner, MD;  Location: [place];  Service: Vascular;  Laterality: Left;  . DILATION AND CURETTAGE OF UTERUS    . INSERTION OF DIALYSIS CATHETER Right 09/15/2016   Procedure: INSERTION OF DIALYSIS CATHETER ON RIGHT SIDE;  Surgeon: Rosetta Posner, MD;  Location: [facility] OR;  Service: Vascular;  Laterality: Right;   Social History   Occupational History  . Not on file  Tobacco Use  . Smoking status: Former Smoker    Packs/day: 0.00    Years: 0.00    Pack years: 0.00    Types: Cigars, Cigarettes    Start date: 09/11/2016  . Smokeless tobacco: Never Used  Substance and Sexual Activity  . Alcohol use: Yes    Alcohol/week: 0.0 standard drinks    Comment: social  . Drug use:  Yes    Types: Marijuana  . Sexual activity: Yes    Birth control/protection: None

## 2018-06-16 DIAGNOSIS — N186 End stage renal disease: Secondary | ICD-10-CM | POA: Diagnosis not present

## 2018-06-16 DIAGNOSIS — Z23 Encounter for immunization: Secondary | ICD-10-CM | POA: Diagnosis not present

## 2018-06-16 DIAGNOSIS — D631 Anemia in chronic kidney disease: Secondary | ICD-10-CM | POA: Diagnosis not present

## 2018-06-16 DIAGNOSIS — N2581 Secondary hyperparathyroidism of renal origin: Secondary | ICD-10-CM | POA: Diagnosis not present

## 2018-06-18 ENCOUNTER — Other Ambulatory Visit: Payer: Self-pay | Admitting: *Deleted

## 2018-06-18 DIAGNOSIS — N189 Chronic kidney disease, unspecified: Secondary | ICD-10-CM

## 2018-06-18 DIAGNOSIS — N186 End stage renal disease: Secondary | ICD-10-CM | POA: Diagnosis not present

## 2018-06-18 DIAGNOSIS — N2581 Secondary hyperparathyroidism of renal origin: Secondary | ICD-10-CM | POA: Diagnosis not present

## 2018-06-18 DIAGNOSIS — D631 Anemia in chronic kidney disease: Secondary | ICD-10-CM | POA: Diagnosis not present

## 2018-06-18 DIAGNOSIS — Z23 Encounter for immunization: Secondary | ICD-10-CM | POA: Diagnosis not present

## 2018-06-20 NOTE — Progress Notes (Deleted)
[facility] [address] [place], [place] [address] Phone: [phone] Subjective:    I'm seeing this patient by the request  of:    CC:   [id number]  Melody [name] is a 42 y.o. female coming in with complaint of ***  Onset-  Location Duration-  Character- Aggravating factors- Reliving factors-  Therapies tried-  Severity-     Past Medical History:  Diagnosis Date  . Alcohol abuse   . Chronic kidney disease    staqge 5  . Drug abuse ([place])   . Ectopic pregnancy   . Headache    Migraines  . Hypertension Dx May 2016  . Tobacco abuse    Past Surgical History:  Procedure Laterality Date  . AV FISTULA PLACEMENT Right 09/15/2016   Procedure: ARTERIOVENOUS (AV) FISTULA CREATION UPPER RIGHT ARM;  Surgeon: Rosetta Posner, MD;  Location: [facility] OR;  Service: Vascular;  Laterality: Right;  . AV FISTULA PLACEMENT Left 12/30/2016   Procedure: ARTERIOVENOUS (AV) FISTULA CREATION USING GORETEX STRETCH GRAFT;  Surgeon: Elam Dutch, MD;  Location: [facility] OR;  Service: Vascular;  Laterality: Left;  . AV FISTULA PLACEMENT Left 06/15/2017   Procedure: INSERTION OF ARTERIOVENOUS (AV) GORE-TEX GRAFT 6MM X 40CM LEFT ARM;  Surgeon: Rosetta Posner, MD;  Location: [place];  Service: Vascular;  Laterality: Left;  . DILATION AND CURETTAGE OF UTERUS    . INSERTION OF DIALYSIS CATHETER Right 09/15/2016   Procedure: INSERTION OF DIALYSIS CATHETER ON RIGHT SIDE;  Surgeon: Rosetta Posner, MD;  Location: [facility] OR;  Service: Vascular;  Laterality: Right;   Social History   Socioeconomic History  . Marital status: Single    Spouse name: Not on file  . Number of children: 2   . Years of education: some colle  . Highest education level: Not on file  Occupational History  . Not on file  Social Needs  . Financial resource strain: Not on file  . Food insecurity:    Worry: Not on file    Inability: Not on file  . Transportation needs:    Medical: Not on file   Non-medical: Not on file  Tobacco Use  . Smoking status: Former Smoker    Packs/day: 0.00    Years: 0.00    Pack years: 0.00    Types: Cigars, Cigarettes    Start date: 09/11/2016  . Smokeless tobacco: Never Used  Substance and Sexual Activity  . Alcohol use: Yes    Alcohol/week: 0.0 standard drinks    Comment: social  . Drug use: Yes    Types: Marijuana  . Sexual activity: Yes    Birth control/protection: None  Lifestyle  . Physical activity:    Days per week: Not on file    Minutes per session: Not on file  . Stress: Not on file  Relationships  . Social connections:    Talks on phone: Not on file    Gets together: Not on file    Attends religious service: Not on file    Active member of club or organization: Not on file    Attends meetings of clubs or organizations: Not on file    Relationship status: Not on file  Other Topics Concern  . Not on file  Social History Narrative   Looking for work    24 and 13 yr son's.    3 grandkids    Allergies  Allergen Reactions  . Acetaminophen     Tylenol #  3 Causes nightmares  . Codeine Other (See Comments)  . Tramadol Hives   Family History  Problem Relation Age of Onset  . CAD Mother   . Hypertension Mother   . Heart disease Mother   . Kidney failure Mother   . CAD Brother   . Hypertension Other   . Kidney failure Other   . Hypertension Maternal Grandmother   . Kidney failure Maternal Grandmother   . Congestive Heart Failure Maternal Grandmother   . Thyroid disease Neg Hx      Current Outpatient Medications (Cardiovascular):  .  amLODipine (NORVASC) 10 MG tablet, Take 1 tablet (10 mg total) by mouth at bedtime. [place] Kitchen  labetalol (NORMODYNE) 300 MG tablet, Take 1 tablet (300 mg total) by mouth 2 (two) times daily. Marland Kitchen  lisinopril (PRINIVIL,ZESTRIL) 30 MG tablet, Take 1 tablet (30 mg total) by mouth at bedtime.   Current Outpatient Medications (Analgesics):  .  acetaminophen (TYLENOL) 500 MG tablet, Take 1,000 mg by  mouth every 6 (six) hours as needed for mild pain or headache.   Current Outpatient Medications (Other):  .  calcium acetate (PHOSLO) 667 MG capsule, Take 2 capsules (1,334 mg total) by mouth 3 (three) times daily with meals. .  cyclobenzaprine (FLEXERIL) 5 MG tablet, Take 1 tablet (5 mg total) by mouth 2 (two) times daily as needed for muscle spasms. Marland Kitchen  gabapentin (NEURONTIN) 300 MG capsule, nightly (Patient not taking: Reported on 06/01/2018) .  lidocaine-prilocaine (EMLA) cream, Apply 1 application topically daily as needed for irritation. .  multivitamin (RENA-VIT) TABS tablet, Take 1 tablet by mouth daily.    Past medical history, social, surgical and family history all reviewed in electronic medical record.  No pertanent information unless stated regarding to the chief complaint.   Review of Systems:  No headache, visual changes, nausea, vomiting, diarrhea, constipation, dizziness, abdominal pain, skin rash, fevers, chills, night sweats, weight loss, swollen lymph nodes, body aches, joint swelling, muscle aches, chest pain, shortness of breath, mood changes.   Objective  There were no vitals taken for this visit. Systems examined below as of    General: No apparent distress alert and oriented x3 mood and affect normal, dressed appropriately.  HEENT: Pupils equal, extraocular movements intact  Respiratory: Patient's speak in full sentences and does not appear short of breath  Cardiovascular: No lower extremity edema, non tender, no erythema  Skin: Warm dry intact with no signs of infection or rash on extremities or on axial skeleton.  Abdomen: Soft nontender  Neuro: Cranial nerves II through XII are intact, neurovascularly intact in all extremities with 2+ DTRs and 2+ pulses.  Lymph: No lymphadenopathy of posterior or anterior cervical chain or axillae bilaterally.  Gait normal with good balance and coordination.  MSK:  Non tender with full range of motion and good stability and  symmetric strength and tone of shoulders, elbows, wrist, hip, knee and ankles bilaterally.     Impression and Recommendations:     This case required medical decision making of moderate complexity. The above documentation has been reviewed and is accurate and complete Lyndal Pulley, DO       Note: This dictation was prepared with Dragon dictation along with smaller phrase technology. Any transcriptional errors that result from this process are unintentional.

## 2018-06-21 DIAGNOSIS — N2581 Secondary hyperparathyroidism of renal origin: Secondary | ICD-10-CM | POA: Diagnosis not present

## 2018-06-21 DIAGNOSIS — Z23 Encounter for immunization: Secondary | ICD-10-CM | POA: Diagnosis not present

## 2018-06-21 DIAGNOSIS — N186 End stage renal disease: Secondary | ICD-10-CM | POA: Diagnosis not present

## 2018-06-21 DIAGNOSIS — D631 Anemia in chronic kidney disease: Secondary | ICD-10-CM | POA: Diagnosis not present

## 2018-06-22 ENCOUNTER — Other Ambulatory Visit: Payer: Medicare Other

## 2018-06-22 ENCOUNTER — Inpatient Hospital Stay: Admission: RE | Admit: 2018-06-22 | Payer: Medicare Other | Source: Ambulatory Visit

## 2018-06-22 ENCOUNTER — Ambulatory Visit: Payer: Medicare Other | Admitting: Family Medicine

## 2018-06-22 DIAGNOSIS — Z0289 Encounter for other administrative examinations: Secondary | ICD-10-CM

## 2018-06-23 DIAGNOSIS — N186 End stage renal disease: Secondary | ICD-10-CM | POA: Diagnosis not present

## 2018-06-23 DIAGNOSIS — Z23 Encounter for immunization: Secondary | ICD-10-CM | POA: Diagnosis not present

## 2018-06-23 DIAGNOSIS — N2581 Secondary hyperparathyroidism of renal origin: Secondary | ICD-10-CM | POA: Diagnosis not present

## 2018-06-23 DIAGNOSIS — D631 Anemia in chronic kidney disease: Secondary | ICD-10-CM | POA: Diagnosis not present

## 2018-06-24 ENCOUNTER — Ambulatory Visit: Payer: Medicare Other | Admitting: Family Medicine

## 2018-06-25 DIAGNOSIS — N186 End stage renal disease: Secondary | ICD-10-CM | POA: Diagnosis not present

## 2018-06-25 DIAGNOSIS — N2581 Secondary hyperparathyroidism of renal origin: Secondary | ICD-10-CM | POA: Diagnosis not present

## 2018-06-25 DIAGNOSIS — I129 Hypertensive chronic kidney disease with stage 1 through stage 4 chronic kidney disease, or unspecified chronic kidney disease: Secondary | ICD-10-CM | POA: Diagnosis not present

## 2018-06-25 DIAGNOSIS — Z992 Dependence on renal dialysis: Secondary | ICD-10-CM | POA: Diagnosis not present

## 2018-06-25 DIAGNOSIS — D509 Iron deficiency anemia, unspecified: Secondary | ICD-10-CM | POA: Diagnosis not present

## 2018-06-25 DIAGNOSIS — D631 Anemia in chronic kidney disease: Secondary | ICD-10-CM | POA: Diagnosis not present

## 2018-06-28 DIAGNOSIS — N186 End stage renal disease: Secondary | ICD-10-CM | POA: Diagnosis not present

## 2018-06-28 DIAGNOSIS — D509 Iron deficiency anemia, unspecified: Secondary | ICD-10-CM | POA: Diagnosis not present

## 2018-06-28 DIAGNOSIS — D631 Anemia in chronic kidney disease: Secondary | ICD-10-CM | POA: Diagnosis not present

## 2018-06-28 DIAGNOSIS — N2581 Secondary hyperparathyroidism of renal origin: Secondary | ICD-10-CM | POA: Diagnosis not present

## 2018-06-30 DIAGNOSIS — D631 Anemia in chronic kidney disease: Secondary | ICD-10-CM | POA: Diagnosis not present

## 2018-06-30 DIAGNOSIS — N186 End stage renal disease: Secondary | ICD-10-CM | POA: Diagnosis not present

## 2018-06-30 DIAGNOSIS — N2581 Secondary hyperparathyroidism of renal origin: Secondary | ICD-10-CM | POA: Diagnosis not present

## 2018-06-30 DIAGNOSIS — D509 Iron deficiency anemia, unspecified: Secondary | ICD-10-CM | POA: Diagnosis not present

## 2018-07-02 DIAGNOSIS — N2581 Secondary hyperparathyroidism of renal origin: Secondary | ICD-10-CM | POA: Diagnosis not present

## 2018-07-02 DIAGNOSIS — D631 Anemia in chronic kidney disease: Secondary | ICD-10-CM | POA: Diagnosis not present

## 2018-07-02 DIAGNOSIS — D509 Iron deficiency anemia, unspecified: Secondary | ICD-10-CM | POA: Diagnosis not present

## 2018-07-02 DIAGNOSIS — N186 End stage renal disease: Secondary | ICD-10-CM | POA: Diagnosis not present

## 2018-07-05 DIAGNOSIS — N186 End stage renal disease: Secondary | ICD-10-CM | POA: Diagnosis not present

## 2018-07-05 DIAGNOSIS — D631 Anemia in chronic kidney disease: Secondary | ICD-10-CM | POA: Diagnosis not present

## 2018-07-05 DIAGNOSIS — D509 Iron deficiency anemia, unspecified: Secondary | ICD-10-CM | POA: Diagnosis not present

## 2018-07-05 DIAGNOSIS — N2581 Secondary hyperparathyroidism of renal origin: Secondary | ICD-10-CM | POA: Diagnosis not present

## 2018-07-07 DIAGNOSIS — N2581 Secondary hyperparathyroidism of renal origin: Secondary | ICD-10-CM | POA: Diagnosis not present

## 2018-07-07 DIAGNOSIS — D509 Iron deficiency anemia, unspecified: Secondary | ICD-10-CM | POA: Diagnosis not present

## 2018-07-07 DIAGNOSIS — N186 End stage renal disease: Secondary | ICD-10-CM | POA: Diagnosis not present

## 2018-07-07 DIAGNOSIS — D631 Anemia in chronic kidney disease: Secondary | ICD-10-CM | POA: Diagnosis not present

## 2018-07-08 ENCOUNTER — Ambulatory Visit: Payer: Medicare Other | Admitting: Family Medicine

## 2018-07-08 DIAGNOSIS — Z0289 Encounter for other administrative examinations: Secondary | ICD-10-CM

## 2018-07-08 NOTE — Progress Notes (Deleted)
[facility] [address] [place], [place] [address] Phone: [phone] Subjective:    I'm seeing this patient by the request  of:    CC:   [id number]  Melody [name] is a 42 y.o. female coming in with complaint of ***  Onset-  Location Duration-  Character- Aggravating factors- Reliving factors-  Therapies tried-  Severity-     Past Medical History:  Diagnosis Date  . Alcohol abuse   . Chronic kidney disease    staqge 5  . Drug abuse ([place])   . Ectopic pregnancy   . Headache    Migraines  . Hypertension Dx May 2016  . Tobacco abuse    Past Surgical History:  Procedure Laterality Date  . AV FISTULA PLACEMENT Right 09/15/2016   Procedure: ARTERIOVENOUS (AV) FISTULA CREATION UPPER RIGHT ARM;  Surgeon: Rosetta Posner, MD;  Location: [facility] OR;  Service: Vascular;  Laterality: Right;  . AV FISTULA PLACEMENT Left 12/30/2016   Procedure: ARTERIOVENOUS (AV) FISTULA CREATION USING GORETEX STRETCH GRAFT;  Surgeon: Elam Dutch, MD;  Location: [facility] OR;  Service: Vascular;  Laterality: Left;  . AV FISTULA PLACEMENT Left 06/15/2017   Procedure: INSERTION OF ARTERIOVENOUS (AV) GORE-TEX GRAFT 6MM X 40CM LEFT ARM;  Surgeon: Rosetta Posner, MD;  Location: [place];  Service: Vascular;  Laterality: Left;  . DILATION AND CURETTAGE OF UTERUS    . INSERTION OF DIALYSIS CATHETER Right 09/15/2016   Procedure: INSERTION OF DIALYSIS CATHETER ON RIGHT SIDE;  Surgeon: Rosetta Posner, MD;  Location: [facility] OR;  Service: Vascular;  Laterality: Right;   Social History   Socioeconomic History  . Marital status: Single    Spouse name: Not on file  . Number of children: 2   . Years of education: some colle  . Highest education level: Not on file  Occupational History  . Not on file  Social Needs  . Financial resource strain: Not on file  . Food insecurity:    Worry: Not on file    Inability: Not on file  . Transportation needs:    Medical: Not on file   Non-medical: Not on file  Tobacco Use  . Smoking status: Former Smoker    Packs/day: 0.00    Years: 0.00    Pack years: 0.00    Types: Cigars, Cigarettes    Start date: 09/11/2016  . Smokeless tobacco: Never Used  Substance and Sexual Activity  . Alcohol use: Yes    Alcohol/week: 0.0 standard drinks    Comment: social  . Drug use: Yes    Types: Marijuana  . Sexual activity: Yes    Birth control/protection: None  Lifestyle  . Physical activity:    Days per week: Not on file    Minutes per session: Not on file  . Stress: Not on file  Relationships  . Social connections:    Talks on phone: Not on file    Gets together: Not on file    Attends religious service: Not on file    Active member of club or organization: Not on file    Attends meetings of clubs or organizations: Not on file    Relationship status: Not on file  Other Topics Concern  . Not on file  Social History Narrative   Looking for work    24 and 13 yr son's.    3 grandkids    Allergies  Allergen Reactions  . Acetaminophen     Tylenol #  3 Causes nightmares  . Codeine Other (See Comments)  . Tramadol Hives   Family History  Problem Relation Age of Onset  . CAD Mother   . Hypertension Mother   . Heart disease Mother   . Kidney failure Mother   . CAD Brother   . Hypertension Other   . Kidney failure Other   . Hypertension Maternal Grandmother   . Kidney failure Maternal Grandmother   . Congestive Heart Failure Maternal Grandmother   . Thyroid disease Neg Hx      Current Outpatient Medications (Cardiovascular):  .  amLODipine (NORVASC) 10 MG tablet, Take 1 tablet (10 mg total) by mouth at bedtime. Mar[place] Kitchen  labetalol (NORMODYNE) 300 MG tablet, Take 1 tablet (300 mg total) by mouth 2 (two) times daily. Marland Kitchen  lisinopril (PRINIVIL,ZESTRIL) 30 MG tablet, Take 1 tablet (30 mg total) by mouth at bedtime.   Current Outpatient Medications (Analgesics):  .  acetaminophen (TYLENOL) 500 MG tablet, Take 1,000 mg by  mouth every 6 (six) hours as needed for mild pain or headache.   Current Outpatient Medications (Other):  .  calcium acetate (PHOSLO) 667 MG capsule, Take 2 capsules (1,334 mg total) by mouth 3 (three) times daily with meals. .  cyclobenzaprine (FLEXERIL) 5 MG tablet, Take 1 tablet (5 mg total) by mouth 2 (two) times daily as needed for muscle spasms. Marland Kitchen  gabapentin (NEURONTIN) 300 MG capsule, nightly (Patient not taking: Reported on 06/01/2018) .  lidocaine-prilocaine (EMLA) cream, Apply 1 application topically daily as needed for irritation. .  multivitamin (RENA-VIT) TABS tablet, Take 1 tablet by mouth daily.    Past medical history, social, surgical and family history all reviewed in electronic medical record.  No pertanent information unless stated regarding to the chief complaint.   Review of Systems:  No headache, visual changes, nausea, vomiting, diarrhea, constipation, dizziness, abdominal pain, skin rash, fevers, chills, night sweats, weight loss, swollen lymph nodes, body aches, joint swelling, muscle aches, chest pain, shortness of breath, mood changes.   Objective  There were no vitals taken for this visit. Systems examined below as of    General: No apparent distress alert and oriented x3 mood and affect normal, dressed appropriately.  HEENT: Pupils equal, extraocular movements intact  Respiratory: Patient's speak in full sentences and does not appear short of breath  Cardiovascular: No lower extremity edema, non tender, no erythema  Skin: Warm dry intact with no signs of infection or rash on extremities or on axial skeleton.  Abdomen: Soft nontender  Neuro: Cranial nerves II through XII are intact, neurovascularly intact in all extremities with 2+ DTRs and 2+ pulses.  Lymph: No lymphadenopathy of posterior or anterior cervical chain or axillae bilaterally.  Gait normal with good balance and coordination.  MSK:  Non tender with full range of motion and good stability and  symmetric strength and tone of shoulders, elbows, wrist, hip, knee and ankles bilaterally.     Impression and Recommendations:     This case required medical decision making of moderate complexity. The above documentation has been reviewed and is accurate and complete Lyndal Pulley, DO       Note: This dictation was prepared with Dragon dictation along with smaller phrase technology. Any transcriptional errors that result from this process are unintentional.

## 2018-07-09 DIAGNOSIS — D631 Anemia in chronic kidney disease: Secondary | ICD-10-CM | POA: Diagnosis not present

## 2018-07-09 DIAGNOSIS — N2581 Secondary hyperparathyroidism of renal origin: Secondary | ICD-10-CM | POA: Diagnosis not present

## 2018-07-09 DIAGNOSIS — N186 End stage renal disease: Secondary | ICD-10-CM | POA: Diagnosis not present

## 2018-07-09 DIAGNOSIS — D509 Iron deficiency anemia, unspecified: Secondary | ICD-10-CM | POA: Diagnosis not present

## 2018-07-12 DIAGNOSIS — D509 Iron deficiency anemia, unspecified: Secondary | ICD-10-CM | POA: Diagnosis not present

## 2018-07-12 DIAGNOSIS — D631 Anemia in chronic kidney disease: Secondary | ICD-10-CM | POA: Diagnosis not present

## 2018-07-12 DIAGNOSIS — N2581 Secondary hyperparathyroidism of renal origin: Secondary | ICD-10-CM | POA: Diagnosis not present

## 2018-07-12 DIAGNOSIS — N186 End stage renal disease: Secondary | ICD-10-CM | POA: Diagnosis not present

## 2018-07-14 DIAGNOSIS — N2581 Secondary hyperparathyroidism of renal origin: Secondary | ICD-10-CM | POA: Diagnosis not present

## 2018-07-14 DIAGNOSIS — N186 End stage renal disease: Secondary | ICD-10-CM | POA: Diagnosis not present

## 2018-07-14 DIAGNOSIS — D631 Anemia in chronic kidney disease: Secondary | ICD-10-CM | POA: Diagnosis not present

## 2018-07-14 DIAGNOSIS — D509 Iron deficiency anemia, unspecified: Secondary | ICD-10-CM | POA: Diagnosis not present

## 2018-07-16 DIAGNOSIS — D509 Iron deficiency anemia, unspecified: Secondary | ICD-10-CM | POA: Diagnosis not present

## 2018-07-16 DIAGNOSIS — D631 Anemia in chronic kidney disease: Secondary | ICD-10-CM | POA: Diagnosis not present

## 2018-07-16 DIAGNOSIS — N2581 Secondary hyperparathyroidism of renal origin: Secondary | ICD-10-CM | POA: Diagnosis not present

## 2018-07-16 DIAGNOSIS — N186 End stage renal disease: Secondary | ICD-10-CM | POA: Diagnosis not present

## 2018-07-18 DIAGNOSIS — N2581 Secondary hyperparathyroidism of renal origin: Secondary | ICD-10-CM | POA: Diagnosis not present

## 2018-07-18 DIAGNOSIS — N186 End stage renal disease: Secondary | ICD-10-CM | POA: Diagnosis not present

## 2018-07-18 DIAGNOSIS — D509 Iron deficiency anemia, unspecified: Secondary | ICD-10-CM | POA: Diagnosis not present

## 2018-07-18 DIAGNOSIS — D631 Anemia in chronic kidney disease: Secondary | ICD-10-CM | POA: Diagnosis not present

## 2018-07-23 DIAGNOSIS — D631 Anemia in chronic kidney disease: Secondary | ICD-10-CM | POA: Diagnosis not present

## 2018-07-23 DIAGNOSIS — N186 End stage renal disease: Secondary | ICD-10-CM | POA: Diagnosis not present

## 2018-07-23 DIAGNOSIS — N2581 Secondary hyperparathyroidism of renal origin: Secondary | ICD-10-CM | POA: Diagnosis not present

## 2018-07-23 DIAGNOSIS — D509 Iron deficiency anemia, unspecified: Secondary | ICD-10-CM | POA: Diagnosis not present

## 2018-07-25 DIAGNOSIS — Z992 Dependence on renal dialysis: Secondary | ICD-10-CM | POA: Diagnosis not present

## 2018-07-25 DIAGNOSIS — N186 End stage renal disease: Secondary | ICD-10-CM | POA: Diagnosis not present

## 2018-07-25 DIAGNOSIS — I129 Hypertensive chronic kidney disease with stage 1 through stage 4 chronic kidney disease, or unspecified chronic kidney disease: Secondary | ICD-10-CM | POA: Diagnosis not present

## 2018-07-26 DIAGNOSIS — N2581 Secondary hyperparathyroidism of renal origin: Secondary | ICD-10-CM | POA: Diagnosis not present

## 2018-07-26 DIAGNOSIS — N186 End stage renal disease: Secondary | ICD-10-CM | POA: Diagnosis not present

## 2018-07-26 DIAGNOSIS — D631 Anemia in chronic kidney disease: Secondary | ICD-10-CM | POA: Diagnosis not present

## 2018-07-27 ENCOUNTER — Other Ambulatory Visit: Payer: Self-pay | Admitting: *Deleted

## 2018-07-27 ENCOUNTER — Other Ambulatory Visit (HOSPITAL_COMMUNITY)
Admission: RE | Admit: 2018-07-27 | Discharge: 2018-07-27 | Disposition: A | Discharge status: Home / Self Care | Payer: Medicare Other | Source: Ambulatory Visit | Attending: Radiology | Admitting: Radiology

## 2018-07-27 ENCOUNTER — Encounter: Payer: Self-pay | Admitting: Vascular Surgery

## 2018-07-27 ENCOUNTER — Ambulatory Visit (INDEPENDENT_AMBULATORY_CARE_PROVIDER_SITE_OTHER): Payer: Medicare Other | Admitting: Vascular Surgery

## 2018-07-27 ENCOUNTER — Other Ambulatory Visit: Payer: Self-pay

## 2018-07-27 ENCOUNTER — Ambulatory Visit (HOSPITAL_COMMUNITY)
Admission: RE | Admit: 2018-07-27 | Discharge: 2018-07-27 | Disposition: A | Discharge status: Home / Self Care | Payer: Medicare Other | Source: Ambulatory Visit | Attending: Vascular Surgery | Admitting: Vascular Surgery

## 2018-07-27 ENCOUNTER — Ambulatory Visit
Admission: RE | Admit: 2018-07-27 | Discharge: 2018-07-27 | Disposition: A | Discharge status: Home / Self Care | Payer: Medicare Other | Source: Ambulatory Visit | Attending: Endocrinology | Admitting: Endocrinology

## 2018-07-27 ENCOUNTER — Encounter: Payer: Self-pay | Admitting: *Deleted

## 2018-07-27 ENCOUNTER — Ambulatory Visit (INDEPENDENT_AMBULATORY_CARE_PROVIDER_SITE_OTHER)
Admission: RE | Admit: 2018-07-27 | Discharge: 2018-07-27 | Disposition: A | Discharge status: Home / Self Care | Payer: Medicare Other | Source: Ambulatory Visit | Attending: Vascular Surgery | Admitting: Vascular Surgery

## 2018-07-27 VITALS — BP 132/90 | HR 62 | Temp 98.6°F | Resp 20 | Ht 67.0 in | Wt 167.0 lb

## 2018-07-27 DIAGNOSIS — N189 Chronic kidney disease, unspecified: Secondary | ICD-10-CM

## 2018-07-27 DIAGNOSIS — E042 Nontoxic multinodular goiter: Secondary | ICD-10-CM | POA: Diagnosis not present

## 2018-07-27 DIAGNOSIS — Z992 Dependence on renal dialysis: Secondary | ICD-10-CM | POA: Diagnosis not present

## 2018-07-27 DIAGNOSIS — E041 Nontoxic single thyroid nodule: Secondary | ICD-10-CM | POA: Diagnosis not present

## 2018-07-27 DIAGNOSIS — N186 End stage renal disease: Secondary | ICD-10-CM

## 2018-07-27 NOTE — Progress Notes (Signed)
Vascular and Vein Specialist of [place]  Patient name: Melody [name] MRN: [id number] DOB: [birth year] Sex: female  REASON FOR VISIT: Discuss access for hemodialysis  HPI: Melody [name] is a 42 y.o. female here today for discussion of new access for hemodialysis.  She had undergone a attempted right radiocephalic fistula in the past which did not mature.  She underwent a left upper arm AV graft by Dr. Oneida Alar in May 2018 with multiple Sarahgrace Broman failures.  I placed a new left upper arm AV graft in October 2018 and she had function of this up until approximately 2 months ago when this occluded.  She currently is being dialyzed via a right IJ hemodialysis catheter.  She is being considered for transplant but no plan currently for this.  Past Medical History:  Diagnosis Date  . Alcohol abuse   . Chronic kidney disease    staqge 5  . Drug abuse ([place])   . Ectopic pregnancy   . Headache    Migraines  . Hypertension Dx May 2016  . Tobacco abuse     Family History  Problem Relation Age of Onset  . CAD Mother   . Hypertension Mother   . Heart disease Mother   . Kidney failure Mother   . CAD Brother   . Hypertension Other   . Kidney failure Other   . Hypertension Maternal Grandmother   . Kidney failure Maternal Grandmother   . Congestive Heart Failure Maternal Grandmother   . Thyroid disease Neg Hx     SOCIAL HISTORY: Social History   Tobacco Use  . Smoking status: Former Smoker    Packs/day: 0.00    Years: 0.00    Pack years: 0.00    Types: Cigars, Cigarettes    Start date: 09/11/2016  . Smokeless tobacco: Never Used  Substance Use Topics  . Alcohol use: Yes    Alcohol/week: 0.0 standard drinks    Comment: social    Allergies  Allergen Reactions  . Acetaminophen     Tylenol #3 Causes nightmares  . Codeine Other (See Comments)  . Tramadol Hives    Current Outpatient Medications  Medication Sig Dispense Refill  .  acetaminophen (TYLENOL) 500 MG tablet Take 1,000 mg by mouth every 6 (six) hours as needed for mild pain or headache.    Marland Kitchen amLODipine (NORVASC) 10 MG tablet Take 1 tablet (10 mg total) by mouth at bedtime. 30 tablet 6  . calcium acetate (PHOSLO) 667 MG capsule Take 2 capsules (1,334 mg total) by mouth 3 (three) times daily with meals. 180 capsule 0  . cyclobenzaprine (FLEXERIL) 5 MG tablet Take 1 tablet (5 mg total) by mouth 2 (two) times daily as needed for muscle spasms. 60 tablet 1  . gabapentin (NEURONTIN) 300 MG capsule nightly 30 capsule 3  . labetalol (NORMODYNE) 300 MG tablet Take 1 tablet (300 mg total) by mouth 2 (two) times daily. 180 tablet 3  . lidocaine-prilocaine (EMLA) cream Apply 1 application topically daily as needed for irritation.  0  . lisinopril (PRINIVIL,ZESTRIL) 30 MG tablet Take 1 tablet (30 mg total) by mouth at bedtime. 90 tablet 3  . multivitamin (RENA-VIT) TABS tablet Take 1 tablet by mouth daily.  0   No current facility-administered medications for this visit.     REVIEW OF SYSTEMS:  [X]  denotes positive finding, [ ]  denotes negative finding Cardiac  Comments:  Chest pain or chest pressure:    Shortness of breath upon exertion:  Short of breath when lying flat:    Irregular heart rhythm:        Vascular    Pain in calf, thigh, or hip brought on by ambulation:    Pain in feet at night that wakes you up from your sleep:     Blood clot in your veins:    Leg swelling:           PHYSICAL EXAM: Vitals:   07/27/18 0906  BP: 132/90  Pulse: 62  Resp: 20  Temp: 98.6 F (37 C)  SpO2: 97%  Weight: 167 lb (75.8 kg)  Height: 5\' 7"  (1.702 m)    GENERAL: The patient is a well-nourished female, in no acute distress. The vital signs are documented above. CARDIOVASCULAR: Plus radial pulses bilaterally.  She does have 2 nonfunctional left upper arm AV graft.  She does have a incision at her radiocephalic area on the right with no thrill present.  She has very  small surface veins bilaterally. PULMONARY: There is good air exchange  MUSCULOSKELETAL: There are no major deformities or cyanosis. NEUROLOGIC: No focal weakness or paresthesias are detected. SKIN: There are no ulcers or rashes noted. PSYCHIATRIC: The patient has a normal affect.  DATA:  Duplex today was reviewed with the patient.  Her right arm cephalic vein is 2 mm in the upper arm and her basilic vein in the right ranges from just over 2 mm to 2-1/2 mm in the right upper arm.  MEDICAL ISSUES: Gust options for hemodialysis.  Explained that feel that her next option will be right arm graft.  We will image her right cephalic and basilic vein at the time of procedure and if this appears to be acceptable will place a fistula but by her imaging today this looks as though it will be too small.  Would place a right upper arm graft in this instance.  We will plan for this on a nondialysis day on 08/05/2018    Rosetta Posner, MD [facility] Vascular and Vein Specialists of [facility] Tel [phone] Pager [phone]

## 2018-07-27 NOTE — H&P (View-Only) (Signed)
Vascular and Vein Specialist of [place]  Patient name: Melody [name] MRN: [id number] DOB: [birth year] Sex: female  REASON FOR VISIT: Discuss access for hemodialysis  HPI: Melody [name] is a 42 y.o. female here today for discussion of new access for hemodialysis.  She had undergone a attempted right radiocephalic fistula in the past which did not mature.  She underwent a left upper arm AV graft by Dr. Oneida Alar in May 2018 with multiple Daevion Navarette failures.  I placed a new left upper arm AV graft in October 2018 and she had function of this up until approximately 2 months ago when this occluded.  She currently is being dialyzed via a right IJ hemodialysis catheter.  She is being considered for transplant but no plan currently for this.  Past Medical History:  Diagnosis Date  . Alcohol abuse   . Chronic kidney disease    staqge 5  . Drug abuse ([place])   . Ectopic pregnancy   . Headache    Migraines  . Hypertension Dx May 2016  . Tobacco abuse     Family History  Problem Relation Age of Onset  . CAD Mother   . Hypertension Mother   . Heart disease Mother   . Kidney failure Mother   . CAD Brother   . Hypertension Other   . Kidney failure Other   . Hypertension Maternal Grandmother   . Kidney failure Maternal Grandmother   . Congestive Heart Failure Maternal Grandmother   . Thyroid disease Neg Hx     SOCIAL HISTORY: Social History   Tobacco Use  . Smoking status: Former Smoker    Packs/day: 0.00    Years: 0.00    Pack years: 0.00    Types: Cigars, Cigarettes    Start date: 09/11/2016  . Smokeless tobacco: Never Used  Substance Use Topics  . Alcohol use: Yes    Alcohol/week: 0.0 standard drinks    Comment: social    Allergies  Allergen Reactions  . Acetaminophen     Tylenol #3 Causes nightmares  . Codeine Other (See Comments)  . Tramadol Hives    Current Outpatient Medications  Medication Sig Dispense Refill  .  acetaminophen (TYLENOL) 500 MG tablet Take 1,000 mg by mouth every 6 (six) hours as needed for mild pain or headache.    Marland Kitchen amLODipine (NORVASC) 10 MG tablet Take 1 tablet (10 mg total) by mouth at bedtime. 30 tablet 6  . calcium acetate (PHOSLO) 667 MG capsule Take 2 capsules (1,334 mg total) by mouth 3 (three) times daily with meals. 180 capsule 0  . cyclobenzaprine (FLEXERIL) 5 MG tablet Take 1 tablet (5 mg total) by mouth 2 (two) times daily as needed for muscle spasms. 60 tablet 1  . gabapentin (NEURONTIN) 300 MG capsule nightly 30 capsule 3  . labetalol (NORMODYNE) 300 MG tablet Take 1 tablet (300 mg total) by mouth 2 (two) times daily. 180 tablet 3  . lidocaine-prilocaine (EMLA) cream Apply 1 application topically daily as needed for irritation.  0  . lisinopril (PRINIVIL,ZESTRIL) 30 MG tablet Take 1 tablet (30 mg total) by mouth at bedtime. 90 tablet 3  . multivitamin (RENA-VIT) TABS tablet Take 1 tablet by mouth daily.  0   No current facility-administered medications for this visit.     REVIEW OF SYSTEMS:  [X]  denotes positive finding, [ ]  denotes negative finding Cardiac  Comments:  Chest pain or chest pressure:    Shortness of breath upon exertion:  Short of breath when lying flat:    Irregular heart rhythm:        Vascular    Pain in calf, thigh, or hip brought on by ambulation:    Pain in feet at night that wakes you up from your sleep:     Blood clot in your veins:    Leg swelling:           PHYSICAL EXAM: Vitals:   07/27/18 0906  BP: 132/90  Pulse: 62  Resp: 20  Temp: 98.6 F (37 C)  SpO2: 97%  Weight: 167 lb (75.8 kg)  Height: 5\' 7"  (1.702 m)    GENERAL: The patient is a well-nourished female, in no acute distress. The vital signs are documented above. CARDIOVASCULAR: Plus radial pulses bilaterally.  She does have 2 nonfunctional left upper arm AV graft.  She does have a incision at her radiocephalic area on the right with no thrill present.  She has very  small surface veins bilaterally. PULMONARY: There is good air exchange  MUSCULOSKELETAL: There are no major deformities or cyanosis. NEUROLOGIC: No focal weakness or paresthesias are detected. SKIN: There are no ulcers or rashes noted. PSYCHIATRIC: The patient has a normal affect.  DATA:  Duplex today was reviewed with the patient.  Her right arm cephalic vein is 2 mm in the upper arm and her basilic vein in the right ranges from just over 2 mm to 2-1/2 mm in the right upper arm.  MEDICAL ISSUES: Gust options for hemodialysis.  Explained that feel that her next option will be right arm graft.  We will image her right cephalic and basilic vein at the time of procedure and if this appears to be acceptable will place a fistula but by her imaging today this looks as though it will be too small.  Would place a right upper arm graft in this instance.  We will plan for this on a nondialysis day on 08/05/2018    Rosetta Posner, MD [facility] Vascular and Vein Specialists of [facility] Tel [phone] Pager [phone]

## 2018-07-28 DIAGNOSIS — N186 End stage renal disease: Secondary | ICD-10-CM | POA: Diagnosis not present

## 2018-07-28 DIAGNOSIS — D631 Anemia in chronic kidney disease: Secondary | ICD-10-CM | POA: Diagnosis not present

## 2018-07-28 DIAGNOSIS — N2581 Secondary hyperparathyroidism of renal origin: Secondary | ICD-10-CM | POA: Diagnosis not present

## 2018-07-30 DIAGNOSIS — N186 End stage renal disease: Secondary | ICD-10-CM | POA: Diagnosis not present

## 2018-07-30 DIAGNOSIS — D631 Anemia in chronic kidney disease: Secondary | ICD-10-CM | POA: Diagnosis not present

## 2018-07-30 DIAGNOSIS — N2581 Secondary hyperparathyroidism of renal origin: Secondary | ICD-10-CM | POA: Diagnosis not present

## 2018-08-02 DIAGNOSIS — N186 End stage renal disease: Secondary | ICD-10-CM | POA: Diagnosis not present

## 2018-08-02 DIAGNOSIS — N2581 Secondary hyperparathyroidism of renal origin: Secondary | ICD-10-CM | POA: Diagnosis not present

## 2018-08-02 DIAGNOSIS — D631 Anemia in chronic kidney disease: Secondary | ICD-10-CM | POA: Diagnosis not present

## 2018-08-03 ENCOUNTER — Encounter (HOSPITAL_COMMUNITY): Payer: Self-pay | Admitting: *Deleted

## 2018-08-03 ENCOUNTER — Other Ambulatory Visit: Payer: Self-pay

## 2018-08-03 NOTE — Progress Notes (Signed)
Spoke with pt for pre-op call. Pt denies cardiac history or diabetes.  

## 2018-08-04 DIAGNOSIS — N186 End stage renal disease: Secondary | ICD-10-CM | POA: Diagnosis not present

## 2018-08-04 DIAGNOSIS — N2581 Secondary hyperparathyroidism of renal origin: Secondary | ICD-10-CM | POA: Diagnosis not present

## 2018-08-04 DIAGNOSIS — D631 Anemia in chronic kidney disease: Secondary | ICD-10-CM | POA: Diagnosis not present

## 2018-08-05 ENCOUNTER — Encounter (HOSPITAL_COMMUNITY): Payer: Self-pay

## 2018-08-05 ENCOUNTER — Encounter (HOSPITAL_COMMUNITY)
Admission: RE | Disposition: A | Discharge status: Home / Self Care | Payer: Self-pay | Source: Home / Self Care | Attending: Vascular Surgery

## 2018-08-05 ENCOUNTER — Other Ambulatory Visit: Payer: Self-pay

## 2018-08-05 ENCOUNTER — Ambulatory Visit (HOSPITAL_COMMUNITY): Payer: Medicare Other

## 2018-08-05 ENCOUNTER — Ambulatory Visit (HOSPITAL_COMMUNITY)
Admission: RE | Admit: 2018-08-05 | Discharge: 2018-08-05 | Disposition: A | Discharge status: Home / Self Care | Payer: Medicare Other | Attending: Vascular Surgery | Admitting: Vascular Surgery

## 2018-08-05 DIAGNOSIS — Z8249 Family history of ischemic heart disease and other diseases of the circulatory system: Secondary | ICD-10-CM | POA: Insufficient documentation

## 2018-08-05 DIAGNOSIS — I120 Hypertensive chronic kidney disease with stage 5 chronic kidney disease or end stage renal disease: Secondary | ICD-10-CM | POA: Insufficient documentation

## 2018-08-05 DIAGNOSIS — N186 End stage renal disease: Secondary | ICD-10-CM | POA: Diagnosis not present

## 2018-08-05 DIAGNOSIS — Z79899 Other long term (current) drug therapy: Secondary | ICD-10-CM | POA: Diagnosis not present

## 2018-08-05 DIAGNOSIS — Z886 Allergy status to analgesic agent status: Secondary | ICD-10-CM | POA: Insufficient documentation

## 2018-08-05 DIAGNOSIS — Z841 Family history of disorders of kidney and ureter: Secondary | ICD-10-CM | POA: Diagnosis not present

## 2018-08-05 DIAGNOSIS — N185 Chronic kidney disease, stage 5: Secondary | ICD-10-CM | POA: Diagnosis not present

## 2018-08-05 DIAGNOSIS — Z87891 Personal history of nicotine dependence: Secondary | ICD-10-CM | POA: Diagnosis not present

## 2018-08-05 HISTORY — DX: Anemia, unspecified: D64.9

## 2018-08-05 HISTORY — PX: AV FISTULA PLACEMENT: SHX1204

## 2018-08-05 LAB — POCT I-STAT 4, (NA,K, GLUC, HGB,HCT)
GLUCOSE: 93 mg/dL (ref 70–99)
HEMATOCRIT: 35 % — AB (ref 36.0–46.0)
Hemoglobin: 11.9 g/dL — ABNORMAL LOW (ref 12.0–15.0)
Potassium: 3.8 mmol/L (ref 3.5–5.1)
Sodium: 138 mmol/L (ref 135–145)

## 2018-08-05 LAB — HCG, SERUM, QUALITATIVE: Preg, Serum: NEGATIVE

## 2018-08-05 SURGERY — ARTERIOVENOUS (AV) FISTULA CREATION
Anesthesia: Monitor Anesthesia Care | Site: Arm Lower | Laterality: Right

## 2018-08-05 MED ORDER — LIDOCAINE-EPINEPHRINE 0.5 %-1:200000 IJ SOLN
INTRAMUSCULAR | Status: DC | PRN
  Administered 2018-08-05: 50 mL

## 2018-08-05 MED ORDER — FENTANYL CITRATE (PF) 250 MCG/5ML IJ SOLN
INTRAMUSCULAR | Status: AC
  Filled 2018-08-05: qty 5

## 2018-08-05 MED ORDER — OXYCODONE-ACETAMINOPHEN 5-325 MG PO TABS
ORAL_TABLET | ORAL | Status: AC
  Filled 2018-08-05: qty 1

## 2018-08-05 MED ORDER — EPHEDRINE 5 MG/ML INJ
INTRAVENOUS | Status: AC
  Filled 2018-08-05: qty 10

## 2018-08-05 MED ORDER — FENTANYL CITRATE (PF) 100 MCG/2ML IJ SOLN
INTRAMUSCULAR | Status: AC
  Filled 2018-08-05: qty 2

## 2018-08-05 MED ORDER — MIDAZOLAM HCL 5 MG/5ML IJ SOLN
INTRAMUSCULAR | Status: DC | PRN
  Administered 2018-08-05: 2 mg via INTRAVENOUS

## 2018-08-05 MED ORDER — ONDANSETRON HCL 4 MG/2ML IJ SOLN
INTRAMUSCULAR | Status: AC
  Filled 2018-08-05: qty 2

## 2018-08-05 MED ORDER — FENTANYL CITRATE (PF) 100 MCG/2ML IJ SOLN
INTRAMUSCULAR | Status: DC | PRN
  Administered 2018-08-05 (×2): 25 ug via INTRAVENOUS
  Administered 2018-08-05: 50 ug via INTRAVENOUS

## 2018-08-05 MED ORDER — OXYCODONE-ACETAMINOPHEN 5-325 MG PO TABS
1.0000 | ORAL_TABLET | Freq: Once | ORAL | Status: AC
  Administered 2018-08-05: 1 via ORAL

## 2018-08-05 MED ORDER — DEXAMETHASONE SODIUM PHOSPHATE 10 MG/ML IJ SOLN
INTRAMUSCULAR | Status: AC
  Filled 2018-08-05: qty 1

## 2018-08-05 MED ORDER — FENTANYL CITRATE (PF) 100 MCG/2ML IJ SOLN
25.0000 ug | INTRAMUSCULAR | Status: DC | PRN
  Administered 2018-08-05: 25 ug via INTRAVENOUS
  Administered 2018-08-05: 50 ug via INTRAVENOUS
  Administered 2018-08-05: 25 ug via INTRAVENOUS

## 2018-08-05 MED ORDER — PROPOFOL 1000 MG/100ML IV EMUL
INTRAVENOUS | Status: AC
  Filled 2018-08-05: qty 200

## 2018-08-05 MED ORDER — SODIUM CHLORIDE 0.9 % IV SOLN
INTRAVENOUS | Status: AC
  Filled 2018-08-05: qty 1.2

## 2018-08-05 MED ORDER — ONDANSETRON HCL 4 MG/2ML IJ SOLN
4.0000 mg | Freq: Once | INTRAMUSCULAR | Status: AC
  Administered 2018-08-05: 4 mg via INTRAVENOUS

## 2018-08-05 MED ORDER — CEFAZOLIN SODIUM-DEXTROSE 2-4 GM/100ML-% IV SOLN
2.0000 g | INTRAVENOUS | Status: AC
  Administered 2018-08-05: 2 g via INTRAVENOUS

## 2018-08-05 MED ORDER — SODIUM CHLORIDE 0.9 % IV SOLN
INTRAVENOUS | Status: DC
  Administered 2018-08-05 (×2): via INTRAVENOUS

## 2018-08-05 MED ORDER — MIDAZOLAM HCL 2 MG/2ML IJ SOLN
INTRAMUSCULAR | Status: AC
  Filled 2018-08-05: qty 2

## 2018-08-05 MED ORDER — PROPOFOL 500 MG/50ML IV EMUL
INTRAVENOUS | Status: DC | PRN
  Administered 2018-08-05: 100 ug/kg/min via INTRAVENOUS

## 2018-08-05 MED ORDER — CHLORHEXIDINE GLUCONATE 4 % EX LIQD: 1.0000 "application " | Freq: Once | CUTANEOUS | Status: DC

## 2018-08-05 MED ORDER — PROPOFOL 10 MG/ML IV BOLUS
INTRAVENOUS | Status: AC
  Filled 2018-08-05: qty 20

## 2018-08-05 MED ORDER — OXYCODONE-ACETAMINOPHEN 5-325 MG PO TABS: 1.0000 | ORAL_TABLET | Freq: Four times a day (QID) | ORAL | 0 refills | Status: DC | PRN

## 2018-08-05 MED ORDER — DEXAMETHASONE SODIUM PHOSPHATE 10 MG/ML IJ SOLN
INTRAMUSCULAR | Status: DC | PRN
  Administered 2018-08-05: 4 mg via INTRAVENOUS

## 2018-08-05 MED ORDER — ONDANSETRON HCL 4 MG/2ML IJ SOLN
INTRAMUSCULAR | Status: DC | PRN
  Administered 2018-08-05: 4 mg via INTRAVENOUS

## 2018-08-05 MED ORDER — SODIUM CHLORIDE 0.9 % IV SOLN
INTRAVENOUS | Status: DC | PRN
  Administered 2018-08-05: 500 mL

## 2018-08-05 MED ORDER — LIDOCAINE-EPINEPHRINE 0.5 %-1:200000 IJ SOLN
INTRAMUSCULAR | Status: AC
  Filled 2018-08-05: qty 1

## 2018-08-05 MED ORDER — 0.9 % SODIUM CHLORIDE (POUR BTL) OPTIME
TOPICAL | Status: DC | PRN
  Administered 2018-08-05: 1000 mL

## 2018-08-05 MED ORDER — CEFAZOLIN SODIUM-DEXTROSE 2-4 GM/100ML-% IV SOLN
INTRAVENOUS | Status: AC
  Filled 2018-08-05: qty 100

## 2018-08-05 SURGICAL SUPPLY — 40 items
ADH SKN CLS APL DERMABOND .7 (GAUZE/BANDAGES/DRESSINGS) ×1
ARMBAND PINK RESTRICT EXTREMIT (MISCELLANEOUS) ×6 IMPLANT
CANISTER SUCT 3000ML PPV (MISCELLANEOUS) ×3 IMPLANT
CANNULA VESSEL 3MM 2 BLNT TIP (CANNULA) ×3 IMPLANT
CLIP LIGATING EXTRA MED SLVR (CLIP) ×3 IMPLANT
CLIP LIGATING EXTRA SM BLUE (MISCELLANEOUS) ×3 IMPLANT
COVER PROBE W GEL 5X96 (DRAPES) ×3 IMPLANT
COVER WAND RF STERILE (DRAPES) ×3 IMPLANT
DECANTER SPIKE VIAL GLASS SM (MISCELLANEOUS) ×3 IMPLANT
DERMABOND ADVANCED (GAUZE/BANDAGES/DRESSINGS) ×2
DERMABOND ADVANCED .7 DNX12 (GAUZE/BANDAGES/DRESSINGS) ×1 IMPLANT
ELECT REM PT RETURN 9FT ADLT (ELECTROSURGICAL) ×3
ELECTRODE REM PT RTRN 9FT ADLT (ELECTROSURGICAL) ×1 IMPLANT
GLOVE BIO SURGEON STRL SZ 6.5 (GLOVE) ×2 IMPLANT
GLOVE BIO SURGEONS STRL SZ 6.5 (GLOVE) ×2
GLOVE BIOGEL PI IND STRL 6.5 (GLOVE) IMPLANT
GLOVE BIOGEL PI IND STRL 7.5 (GLOVE) IMPLANT
GLOVE BIOGEL PI INDICATOR 6.5 (GLOVE) ×4
GLOVE BIOGEL PI INDICATOR 7.5 (GLOVE) ×2
GLOVE ECLIPSE 6.5 STRL STRAW (GLOVE) ×2 IMPLANT
GLOVE ECLIPSE 7.5 STRL STRAW (GLOVE) ×3 IMPLANT
GLOVE SS BIOGEL STRL SZ 7.5 (GLOVE) ×1 IMPLANT
GLOVE SUPERSENSE BIOGEL SZ 7.5 (GLOVE) ×2
GOWN STRL REUS W/ TWL LRG LVL3 (GOWN DISPOSABLE) ×3 IMPLANT
GOWN STRL REUS W/TWL LRG LVL3 (GOWN DISPOSABLE) ×9
GRAFT GORETEX STRT 4-7X45 (Vascular Products) ×2 IMPLANT
KIT BASIN OR (CUSTOM PROCEDURE TRAY) ×3 IMPLANT
KIT TURNOVER KIT B (KITS) ×3 IMPLANT
MARKER SKIN DUAL TIP RULER LAB (MISCELLANEOUS) ×2 IMPLANT
NS IRRIG 1000ML POUR BTL (IV SOLUTION) ×3 IMPLANT
PACK CV ACCESS (CUSTOM PROCEDURE TRAY) ×3 IMPLANT
PAD ARMBOARD 7.5X6 YLW CONV (MISCELLANEOUS) ×6 IMPLANT
STOCKINETTE 6  STRL (DRAPES) ×2
STOCKINETTE 6 STRL (DRAPES) IMPLANT
SUT PROLENE 6 0 CC (SUTURE) ×5 IMPLANT
SUT VIC AB 3-0 SH 27 (SUTURE) ×6
SUT VIC AB 3-0 SH 27X BRD (SUTURE) ×2 IMPLANT
TOWEL GREEN STERILE (TOWEL DISPOSABLE) ×3 IMPLANT
UNDERPAD 30X30 (UNDERPADS AND DIAPERS) ×3 IMPLANT
WATER STERILE IRR 1000ML POUR (IV SOLUTION) ×3 IMPLANT

## 2018-08-05 NOTE — Op Note (Signed)
    OPERATIVE REPORT  DATE OF SURGERY: 08/05/2018  PATIENT: Melody [name], 42 y.o. female MRN: [id number]  DOB: 1975/09/04  PRE-OPERATIVE DIAGNOSIS: End-stage renal disease  POST-OPERATIVE DIAGNOSIS:  Same  PROCEDURE: Right forearm loop AV Gore-Tex graft  SURGEON:  Curt Jews, M.D.  PHYSICIAN ASSISTANT: Laurence Slate, PA-C  ANESTHESIA: Local with sedation  EBL: per anesthesia record  Total I/O In: 500 [I.V.:500] Out: -   BLOOD ADMINISTERED: none  DRAINS: none  SPECIMEN: none  COUNTS CORRECT:  YES  PATIENT DISPOSITION:  PACU - hemodynamically stable  PROCEDURE DETAILS: Patient was taken to the operating placed supine position where the area the right arm was prepped draped in usual sterile fashion.  SonoSite ultrasound was used to visualize the basilic and cephalic veins which were both too small for access.  The incision was made over the level of the brachial artery at the antecubital space.  The patient had a good caliber brachial vein and moderate-sized brachial artery.  Both of these were encircled.  Separate incision was made over the distal forearm in a loop configuration tunnel was created.  A 4 x 7 tapered Gore-Tex graft was brought through the tunnel.  The brachial artery was occluded proximally distally and was opened with 11 blade filtrating pot scissors.  A small arteriotomy was created.  4 mm portion of the graft was spatulated and sewn end-to-side to the artery with a running 6-0 Prolene suture.  Clamps were removed after placing these on the graft and the graft was flushed with heparinized saline and reoccluded.  Next the brachial vein was occluded proximally distally and was opened 11 blade simultaneous Potts scissors.  The graft was spatulated and sewn end-to-side to the vein with a running 6-0 Prolene suture.  Clamps removed and excellent thrill was noted.  The wounds irrigated with saline.  Hemostasis talus cautery.  Wounds were closed with 3-0 Vicryl in the  subcutaneous and subcuticular tissue.  Sterile dressing was applied and the patient was transferred to the recovery room in stable condition   Rosetta Posner, M.D., [facility] 08/05/2018 11:50 AM

## 2018-08-05 NOTE — Anesthesia Procedure Notes (Signed)
Procedure Name: MAC Date/Time: 08/05/2018 10:56 AM Performed by: Carney Living, CRNA Pre-anesthesia Checklist: Patient identified, Emergency Drugs available, Suction available, Patient being monitored and Timeout performed Patient Re-evaluated:Patient Re-evaluated prior to induction Oxygen Delivery Method: Simple face mask

## 2018-08-05 NOTE — Anesthesia Postprocedure Evaluation (Signed)
Anesthesia Post Note  Patient: Melody [name]  Procedure(s) Performed: Insertion of Right arm gortex graft (Right Arm Lower)     Patient location during evaluation: PACU Anesthesia Type: MAC Level of consciousness: awake Pain management: pain level controlled Vital Signs Assessment: post-procedure vital signs reviewed and stable Respiratory status: spontaneous breathing Cardiovascular status: stable Postop Assessment: no headache Anesthetic complications: no    Last Vitals:  Vitals:   08/05/18 1245 08/05/18 1258  BP: (!) 145/91   Pulse: 84 79  Resp: (!) 21 18  Temp:    SpO2: 100% 100%    Last Pain:  Vitals:   08/05/18 1258  TempSrc:   PainSc: 2                  Arryana Tolleson

## 2018-08-05 NOTE — Interval H&P Note (Signed)
History and Physical Interval Note:  08/05/2018 10:03 AM  Melody [name]  has presented today for surgery, with the diagnosis of END STAGE RENAL DISEASE FOR HEMODIALYSIS ACCESS  The various methods of treatment have been discussed with the patient and family. After consideration of risks, benefits and other options for treatment, the patient has consented to  Procedure(s): ARTERIOVENOUS (AV) FISTULA CREATION VERSUS ARTERIOVENOUS GRAFT RIGHT ARM (Right) as a surgical intervention .  The patient's history has been reviewed, patient examined, no change in status, stable for surgery.  I have reviewed the patient's chart and labs.  Questions were answered to the patient's satisfaction.     Curt Jews

## 2018-08-05 NOTE — Transfer of Care (Signed)
Immediate Anesthesia Transfer of Care Note  Patient: Melody [name]  Procedure(s) Performed: Insertion of Right arm gortex graft (Right Arm Lower)  Patient Location: PACU  Anesthesia Type:MAC  Level of Consciousness: awake, alert , oriented and patient cooperative  Airway & Oxygen Therapy: Patient Spontanous Breathing and Patient connected to nasal cannula oxygen  Post-op Assessment: Report given to RN, Post -op Vital signs reviewed and stable and Patient moving all extremities X 4  Post vital signs: Reviewed and stable  Last Vitals:  Vitals Value Taken Time  BP 162/98 08/05/2018 12:12 PM  Temp    Pulse 61 08/05/2018 12:15 PM  Resp 15 08/05/2018 12:15 PM  SpO2 100 % 08/05/2018 12:15 PM  Vitals shown include unvalidated device data.  Last Pain:  Vitals:   08/05/18 0938  TempSrc:   PainSc: 0-No pain      Patients Stated Pain Goal: 3 (11/94/17 4081)  Complications: No apparent anesthesia complications

## 2018-08-05 NOTE — Discharge Instructions (Signed)
Vascular and Vein Specialists of [facility]  Discharge Instructions  AV Fistula or Graft Surgery for Dialysis Access  Please refer to the following instructions for your post-procedure care. Your surgeon or physician assistant will discuss any changes with you.  Activity  You may drive the day following your surgery, if you are comfortable and no longer taking prescription pain medication. Resume full activity as the soreness in your incision resolves.  Bathing/Showering  You may shower after you go home. Keep your incision dry for 48 hours. Do not soak in a bathtub, hot tub, or swim until the incision heals completely. You may not shower if you have a hemodialysis catheter.  Incision Care  Clean your incision with mild soap and water after 48 hours. Pat the area dry with a clean towel. You do not need a bandage unless otherwise instructed. Do not apply any ointments or creams to your incision. You may have skin glue on your incision. Do not peel it off. It will come off on its own in about one week. Your arm may swell a bit after surgery. To reduce swelling use pillows to elevate your arm so it is above your heart. Your doctor will tell you if you need to lightly wrap your arm with an ACE bandage.  Diet  Resume your normal diet. There are not special food restrictions following this procedure. In order to heal from your surgery, it is CRITICAL to get adequate nutrition. Your body requires vitamins, minerals, and protein. Vegetables are the best source of vitamins and minerals. Vegetables also provide the perfect balance of protein. Processed food has little nutritional value, so try to avoid this.  Medications  Resume taking all of your medications. If your incision is causing pain, you may take over-the counter pain relievers such as acetaminophen (Tylenol). If you were prescribed a stronger pain medication, please be aware these medications can cause nausea and constipation. Prevent  nausea by taking the medication with a snack or meal. Avoid constipation by drinking plenty of fluids and eating foods with high amount of fiber, such as fruits, vegetables, and grains. Do not take Tylenol if you are taking prescription pain medications.     Follow up Your surgeon may want to see you in the office following your access surgery. If so, this will be arranged at the time of your surgery.  Please call us immediately for any of the following conditions:  Increased pain, redness, drainage (pus) from your incision site Fever of 101 degrees or higher Severe or worsening pain at your incision site Hand pain or numbness.  Reduce your risk of vascular disease:  Stop smoking. If you would like help, call [facility] at 1-800-QUIT-NOW 501-662-4023) or [place] at [place] your cholesterol Maintain a desired weight Control your diabetes Keep your blood pressure down  Dialysis  It will take several weeks to several months for your new dialysis access to be ready for use. Your surgeon will determine when it is OK to use it. Your nephrologist will continue to direct your dialysis. You can continue to use your Permcath until your new access is ready for use.  If you have any questions, please call the office at [phone].   Post Anesthesia Home Care Instructions  Activity: Get plenty of rest for the remainder of the day. A responsible individual must stay with you for 24 hours following the procedure.  For the next 24 hours, DO NOT: -Drive a car -Paediatric nurse -Drink alcoholic  beverages -Take any medication unless instructed by your physician -Make any legal decisions or sign important papers.  Meals: Start with liquid foods such as gelatin or soup. Progress to regular foods as tolerated. Avoid greasy, spicy, heavy foods. If nausea and/or vomiting occur, drink only clear liquids until the nausea and/or vomiting subsides. Call your physician if  vomiting continues.  Special Instructions/Symptoms: Your throat may feel dry or sore from the anesthesia or the breathing tube placed in your throat during surgery. If this causes discomfort, gargle with warm salt water. The discomfort should disappear within 24 hours.  If you had a scopolamine patch placed behind your ear for the management of post- operative nausea and/or vomiting:  1. The medication in the patch is effective for 72 hours, after which it should be removed.  Wrap patch in a tissue and discard in the trash. Wash hands thoroughly with soap and water. 2. You may remove the patch earlier than 72 hours if you experience unpleasant side effects which may include dry mouth, dizziness or visual disturbances. 3. Avoid touching the patch. Wash your hands with soap and water after contact with the patch.

## 2018-08-05 NOTE — Anesthesia Preprocedure Evaluation (Addendum)
Anesthesia Evaluation  Patient identified by MRN, date of birth, ID band Patient awake    Reviewed: Allergy & Precautions, NPO status , Patient's Chart, lab work & pertinent test results, reviewed documented beta blocker date and time   Airway Mallampati: II  TM Distance: >3 FB Neck ROM: Full    Dental  (+) Teeth Intact, Dental Advisory Given, Chipped   Pulmonary former smoker,    breath sounds clear to auscultation       Cardiovascular hypertension, Pt. on medications and Pt. on home beta blockers  Rhythm:Regular Rate:Normal     Neuro/Psych  Headaches,  Neuromuscular disease    GI/Hepatic   Endo/Other    Renal/GU Dialysis and ESRFRenal disease     Musculoskeletal   Abdominal   Peds  Hematology  (+) anemia ,   Anesthesia Other Findings   Reproductive/Obstetrics                           Anesthesia Physical Anesthesia Plan  ASA: III  Anesthesia Plan: MAC   Post-op Pain Management:    Induction: Intravenous  PONV Risk Score and Plan: 3 and Ondansetron, Dexamethasone and Midazolam  Airway Management Planned: Nasal Cannula and Simple Face Mask  Additional Equipment:   Intra-op Plan:   Post-operative Plan:   Informed Consent: I have reviewed the patients History and Physical, chart, labs and discussed the procedure including the risks, benefits and alternatives for the proposed anesthesia with the patient or authorized representative who has indicated his/her understanding and acceptance.   Dental advisory given  Plan Discussed with: CRNA, Anesthesiologist and Surgeon  Anesthesia Plan Comments:       Anesthesia Quick Evaluation

## 2018-08-06 ENCOUNTER — Encounter (HOSPITAL_COMMUNITY): Payer: Self-pay | Admitting: Vascular Surgery

## 2018-08-06 DIAGNOSIS — D631 Anemia in chronic kidney disease: Secondary | ICD-10-CM | POA: Diagnosis not present

## 2018-08-06 DIAGNOSIS — N186 End stage renal disease: Secondary | ICD-10-CM | POA: Diagnosis not present

## 2018-08-06 DIAGNOSIS — N2581 Secondary hyperparathyroidism of renal origin: Secondary | ICD-10-CM | POA: Diagnosis not present

## 2018-08-09 DIAGNOSIS — N186 End stage renal disease: Secondary | ICD-10-CM | POA: Diagnosis not present

## 2018-08-09 DIAGNOSIS — N2581 Secondary hyperparathyroidism of renal origin: Secondary | ICD-10-CM | POA: Diagnosis not present

## 2018-08-09 DIAGNOSIS — D631 Anemia in chronic kidney disease: Secondary | ICD-10-CM | POA: Diagnosis not present

## 2018-08-11 DIAGNOSIS — D631 Anemia in chronic kidney disease: Secondary | ICD-10-CM | POA: Diagnosis not present

## 2018-08-11 DIAGNOSIS — N2581 Secondary hyperparathyroidism of renal origin: Secondary | ICD-10-CM | POA: Diagnosis not present

## 2018-08-11 DIAGNOSIS — N186 End stage renal disease: Secondary | ICD-10-CM | POA: Diagnosis not present

## 2018-08-13 DIAGNOSIS — D631 Anemia in chronic kidney disease: Secondary | ICD-10-CM | POA: Diagnosis not present

## 2018-08-13 DIAGNOSIS — N2581 Secondary hyperparathyroidism of renal origin: Secondary | ICD-10-CM | POA: Diagnosis not present

## 2018-08-13 DIAGNOSIS — N186 End stage renal disease: Secondary | ICD-10-CM | POA: Diagnosis not present

## 2018-08-15 DIAGNOSIS — D631 Anemia in chronic kidney disease: Secondary | ICD-10-CM | POA: Diagnosis not present

## 2018-08-15 DIAGNOSIS — N2581 Secondary hyperparathyroidism of renal origin: Secondary | ICD-10-CM | POA: Diagnosis not present

## 2018-08-15 DIAGNOSIS — N186 End stage renal disease: Secondary | ICD-10-CM | POA: Diagnosis not present

## 2018-08-17 DIAGNOSIS — N186 End stage renal disease: Secondary | ICD-10-CM | POA: Diagnosis not present

## 2018-08-17 DIAGNOSIS — D631 Anemia in chronic kidney disease: Secondary | ICD-10-CM | POA: Diagnosis not present

## 2018-08-17 DIAGNOSIS — N2581 Secondary hyperparathyroidism of renal origin: Secondary | ICD-10-CM | POA: Diagnosis not present

## 2018-08-20 DIAGNOSIS — N186 End stage renal disease: Secondary | ICD-10-CM | POA: Diagnosis not present

## 2018-08-20 DIAGNOSIS — D631 Anemia in chronic kidney disease: Secondary | ICD-10-CM | POA: Diagnosis not present

## 2018-08-20 DIAGNOSIS — N2581 Secondary hyperparathyroidism of renal origin: Secondary | ICD-10-CM | POA: Diagnosis not present

## 2018-08-22 DIAGNOSIS — N2581 Secondary hyperparathyroidism of renal origin: Secondary | ICD-10-CM | POA: Diagnosis not present

## 2018-08-22 DIAGNOSIS — N186 End stage renal disease: Secondary | ICD-10-CM | POA: Diagnosis not present

## 2018-08-22 DIAGNOSIS — D631 Anemia in chronic kidney disease: Secondary | ICD-10-CM | POA: Diagnosis not present

## 2018-08-24 DIAGNOSIS — N186 End stage renal disease: Secondary | ICD-10-CM | POA: Diagnosis not present

## 2018-08-24 DIAGNOSIS — N2581 Secondary hyperparathyroidism of renal origin: Secondary | ICD-10-CM | POA: Diagnosis not present

## 2018-08-24 DIAGNOSIS — D631 Anemia in chronic kidney disease: Secondary | ICD-10-CM | POA: Diagnosis not present

## 2018-08-25 DIAGNOSIS — I129 Hypertensive chronic kidney disease with stage 1 through stage 4 chronic kidney disease, or unspecified chronic kidney disease: Secondary | ICD-10-CM | POA: Diagnosis not present

## 2018-08-25 DIAGNOSIS — Z992 Dependence on renal dialysis: Secondary | ICD-10-CM | POA: Diagnosis not present

## 2018-08-25 DIAGNOSIS — N186 End stage renal disease: Secondary | ICD-10-CM | POA: Diagnosis not present

## 2018-08-27 DIAGNOSIS — D509 Iron deficiency anemia, unspecified: Secondary | ICD-10-CM | POA: Diagnosis not present

## 2018-08-27 DIAGNOSIS — N186 End stage renal disease: Secondary | ICD-10-CM | POA: Diagnosis not present

## 2018-08-27 DIAGNOSIS — N2581 Secondary hyperparathyroidism of renal origin: Secondary | ICD-10-CM | POA: Diagnosis not present

## 2018-08-27 DIAGNOSIS — Z992 Dependence on renal dialysis: Secondary | ICD-10-CM | POA: Diagnosis not present

## 2018-08-27 DIAGNOSIS — D631 Anemia in chronic kidney disease: Secondary | ICD-10-CM | POA: Diagnosis not present

## 2018-08-30 DIAGNOSIS — D631 Anemia in chronic kidney disease: Secondary | ICD-10-CM | POA: Diagnosis not present

## 2018-08-30 DIAGNOSIS — Z992 Dependence on renal dialysis: Secondary | ICD-10-CM | POA: Diagnosis not present

## 2018-08-30 DIAGNOSIS — N186 End stage renal disease: Secondary | ICD-10-CM | POA: Diagnosis not present

## 2018-08-30 DIAGNOSIS — N2581 Secondary hyperparathyroidism of renal origin: Secondary | ICD-10-CM | POA: Diagnosis not present

## 2018-08-30 DIAGNOSIS — D509 Iron deficiency anemia, unspecified: Secondary | ICD-10-CM | POA: Diagnosis not present

## 2018-09-01 DIAGNOSIS — N186 End stage renal disease: Secondary | ICD-10-CM | POA: Diagnosis not present

## 2018-09-01 DIAGNOSIS — D631 Anemia in chronic kidney disease: Secondary | ICD-10-CM | POA: Diagnosis not present

## 2018-09-01 DIAGNOSIS — D509 Iron deficiency anemia, unspecified: Secondary | ICD-10-CM | POA: Diagnosis not present

## 2018-09-01 DIAGNOSIS — N2581 Secondary hyperparathyroidism of renal origin: Secondary | ICD-10-CM | POA: Diagnosis not present

## 2018-09-01 DIAGNOSIS — Z992 Dependence on renal dialysis: Secondary | ICD-10-CM | POA: Diagnosis not present

## 2018-09-03 DIAGNOSIS — N2581 Secondary hyperparathyroidism of renal origin: Secondary | ICD-10-CM | POA: Diagnosis not present

## 2018-09-03 DIAGNOSIS — D631 Anemia in chronic kidney disease: Secondary | ICD-10-CM | POA: Diagnosis not present

## 2018-09-03 DIAGNOSIS — D509 Iron deficiency anemia, unspecified: Secondary | ICD-10-CM | POA: Diagnosis not present

## 2018-09-03 DIAGNOSIS — N186 End stage renal disease: Secondary | ICD-10-CM | POA: Diagnosis not present

## 2018-09-03 DIAGNOSIS — Z992 Dependence on renal dialysis: Secondary | ICD-10-CM | POA: Diagnosis not present

## 2018-09-06 DIAGNOSIS — D631 Anemia in chronic kidney disease: Secondary | ICD-10-CM | POA: Diagnosis not present

## 2018-09-06 DIAGNOSIS — N2581 Secondary hyperparathyroidism of renal origin: Secondary | ICD-10-CM | POA: Diagnosis not present

## 2018-09-06 DIAGNOSIS — D509 Iron deficiency anemia, unspecified: Secondary | ICD-10-CM | POA: Diagnosis not present

## 2018-09-06 DIAGNOSIS — N186 End stage renal disease: Secondary | ICD-10-CM | POA: Diagnosis not present

## 2018-09-06 DIAGNOSIS — Z992 Dependence on renal dialysis: Secondary | ICD-10-CM | POA: Diagnosis not present

## 2018-09-07 ENCOUNTER — Telehealth: Payer: Self-pay | Admitting: Endocrinology

## 2018-09-07 ENCOUNTER — Ambulatory Visit: Payer: Medicare Other | Admitting: Endocrinology

## 2018-09-07 NOTE — Telephone Encounter (Signed)
Please come back for a follow-up appointment in 6 months.   

## 2018-09-07 NOTE — Telephone Encounter (Signed)
Please refer to Dr. Ellison's response 

## 2018-09-07 NOTE — Telephone Encounter (Signed)
Patient no showed today's appt. Please advise on how to follow up. °A. No follow up necessary. °B. Follow up urgent. Contact patient immediately. °C. Follow up necessary. Contact patient and schedule visit in ___ days. °D. Follow up advised. Contact patient and schedule visit in ____weeks. ° °Would you like the NS fee to be applied to this visit? ° °

## 2018-09-07 NOTE — Telephone Encounter (Signed)
Scheduled appointment for 09/09/18 @ 9 a.m.

## 2018-09-08 DIAGNOSIS — D509 Iron deficiency anemia, unspecified: Secondary | ICD-10-CM | POA: Diagnosis not present

## 2018-09-08 DIAGNOSIS — D631 Anemia in chronic kidney disease: Secondary | ICD-10-CM | POA: Diagnosis not present

## 2018-09-08 DIAGNOSIS — Z992 Dependence on renal dialysis: Secondary | ICD-10-CM | POA: Diagnosis not present

## 2018-09-08 DIAGNOSIS — N2581 Secondary hyperparathyroidism of renal origin: Secondary | ICD-10-CM | POA: Diagnosis not present

## 2018-09-08 DIAGNOSIS — N186 End stage renal disease: Secondary | ICD-10-CM | POA: Diagnosis not present

## 2018-09-09 ENCOUNTER — Ambulatory Visit (INDEPENDENT_AMBULATORY_CARE_PROVIDER_SITE_OTHER): Payer: Medicare Other | Admitting: Endocrinology

## 2018-09-09 ENCOUNTER — Encounter: Payer: Self-pay | Admitting: Endocrinology

## 2018-09-09 VITALS — BP 118/84 | HR 81 | Ht 67.0 in | Wt 170.0 lb

## 2018-09-09 DIAGNOSIS — E042 Nontoxic multinodular goiter: Secondary | ICD-10-CM | POA: Diagnosis not present

## 2018-09-09 NOTE — Progress Notes (Signed)
Subjective:    Patient ID: Melody [name], female    DOB: Jan 17, 1976, 43 y.o.   MRN: [id number]  HPI  Pt returns for f/u of multinodular thyroid (dx'ed mid-2019; bxs of RMP and LMP were both [facility] Cat 2; she is euthyroid off rx).  She does not notice the nodules Past Medical History:  Diagnosis Date  . Alcohol abuse   . Anemia   . Chronic kidney disease    staqge 5 M/W/F Dialysis  . Drug abuse ([place])   . Ectopic pregnancy   . Headache    Migraines  . Hypertension Dx May 2016  . Tobacco abuse     Past Surgical History:  Procedure Laterality Date  . AV FISTULA PLACEMENT Right 09/15/2016   Procedure: ARTERIOVENOUS (AV) FISTULA CREATION UPPER RIGHT ARM;  Surgeon: Rosetta Posner, MD;  Location: [facility];  Service: Vascular;  Laterality: Right;  . AV FISTULA PLACEMENT Left 12/30/2016   Procedure: ARTERIOVENOUS (AV) FISTULA CREATION USING GORETEX STRETCH GRAFT;  Surgeon: Elam Dutch, MD;  Location: [facility] OR;  Service: Vascular;  Laterality: Left;  . AV FISTULA PLACEMENT Left 06/15/2017   Procedure: INSERTION OF ARTERIOVENOUS (AV) GORE-TEX GRAFT 6MM X 40CM LEFT ARM;  Surgeon: Rosetta Posner, MD;  Location: [place];  Service: Vascular;  Laterality: Left;  . AV FISTULA PLACEMENT Right 08/05/2018   Procedure: Insertion of Right arm gortex graft;  Surgeon: Rosetta Posner, MD;  Location: [place];  Service: Vascular;  Laterality: Right;  . DILATION AND CURETTAGE OF UTERUS    . INSERTION OF DIALYSIS CATHETER Right 09/15/2016   Procedure: INSERTION OF DIALYSIS CATHETER ON RIGHT SIDE;  Surgeon: Rosetta Posner, MD;  Location: [facility] OR;  Service: Vascular;  Laterality: Right;    Social History   Socioeconomic History  . Marital status: Single    Spouse name: Not on file  . Number of children: 2   . Years of education: some colle  . Highest education level: Not on file  Occupational History  . Not on file  Social Needs  . Financial resource strain: Not on file  . Food insecurity:    Worry: Not on file      Inability: Not on file  . Transportation needs:    Medical: Not on file    Non-medical: Not on file  Tobacco Use  . Smoking status: Former Smoker    Packs/day: 0.00    Years: 0.00    Pack years: 0.00    Types: Cigars, Cigarettes    Start date: 09/11/2016  . Smokeless tobacco: Never Used  Substance and Sexual Activity  . Alcohol use: Yes    Alcohol/week: 0.0 standard drinks    Comment: occasionally  . Drug use: Yes    Types: Marijuana  . Sexual activity: Yes    Birth control/protection: None  Lifestyle  . Physical activity:    Days per week: Not on file    Minutes per session: Not on file  . Stress: Not on file  Relationships  . Social connections:    Talks on phone: Not on file    Gets together: Not on file    Attends religious service: Not on file    Active member of club or organization: Not on file    Attends meetings of clubs or organizations: Not on file    Relationship status: Not on file  . Intimate partner violence:    Fear of current or ex partner: Not on file  Emotionally abused: Not on file    Physically abused: Not on file    Forced sexual activity: Not on file  Other Topics Concern  . Not on file  Social History Narrative   Looking for work    24 and 13 yr son's.    3 grandkids     Current Outpatient Medications on File Prior to Visit  Medication Sig Dispense Refill  . acetaminophen (TYLENOL) 500 MG tablet Take 1,000 mg by mouth every 6 (six) hours as needed for mild pain or headache.    Marland Kitchen amLODipine (NORVASC) 10 MG tablet Take 1 tablet (10 mg total) by mouth at bedtime. 30 tablet 6  . calcium acetate (PHOSLO) 667 MG capsule Take 2 capsules (1,334 mg total) by mouth 3 (three) times daily with meals. 180 capsule 0  . labetalol (NORMODYNE) 300 MG tablet Take 1 tablet (300 mg total) by mouth 2 (two) times daily. 180 tablet 3  . lidocaine-prilocaine (EMLA) cream Apply 1 application topically daily as needed for irritation.  0  . lisinopril  (PRINIVIL,ZESTRIL) 30 MG tablet Take 1 tablet (30 mg total) by mouth at bedtime. 90 tablet 3  . multivitamin (RENA-VIT) TABS tablet Take 1 tablet by mouth daily.  0   No current facility-administered medications on file prior to visit.     Allergies  Allergen Reactions  . Codeine Other (See Comments)    Tylenol #3 causes nightmares  . Tramadol Hives    Family History  Problem Relation Age of Onset  . CAD Mother   . Hypertension Mother   . Heart disease Mother   . Kidney failure Mother   . CAD Brother   . Hypertension Other   . Kidney failure Other   . Hypertension Maternal Grandmother   . Kidney failure Maternal Grandmother   . Congestive Heart Failure Maternal Grandmother   . Thyroid disease Neg Hx     BP 118/84 (BP Location: Left Arm, Patient Position: Sitting, Cuff Size: Normal)   Pulse 81   Ht 5\' 7"  (1.702 m)   Wt 170 lb (77.1 kg)   SpO2 97%   BMI 26.63 kg/m   Review of Systems Denies neck mpain    Objective:   Physical Exam VITAL SIGNS:  See vs page GENERAL: no distress NECK: 1-2 cm right and left thyroid nodules are again palpable.        Assessment & Plan:  MNG, due for recheck renal failure: this would affect surgical risk should she come to surgery  Patient Instructions  Let's recheck the ultrasound.  you will receive a phone call, about a day and time for an appointment. If as expected, there is little or no change, please come back for a follow-up appointment in 1 year.

## 2018-09-09 NOTE — Patient Instructions (Signed)
Let's recheck the ultrasound.  you will receive a phone call, about a day and time for an appointment. If as expected, there is little or no change, please come back for a follow-up appointment in 1 year.

## 2018-09-10 DIAGNOSIS — D509 Iron deficiency anemia, unspecified: Secondary | ICD-10-CM | POA: Diagnosis not present

## 2018-09-10 DIAGNOSIS — Z992 Dependence on renal dialysis: Secondary | ICD-10-CM | POA: Diagnosis not present

## 2018-09-10 DIAGNOSIS — D631 Anemia in chronic kidney disease: Secondary | ICD-10-CM | POA: Diagnosis not present

## 2018-09-10 DIAGNOSIS — N186 End stage renal disease: Secondary | ICD-10-CM | POA: Diagnosis not present

## 2018-09-10 DIAGNOSIS — N2581 Secondary hyperparathyroidism of renal origin: Secondary | ICD-10-CM | POA: Diagnosis not present

## 2018-09-13 DIAGNOSIS — Z992 Dependence on renal dialysis: Secondary | ICD-10-CM | POA: Diagnosis not present

## 2018-09-13 DIAGNOSIS — N2581 Secondary hyperparathyroidism of renal origin: Secondary | ICD-10-CM | POA: Diagnosis not present

## 2018-09-13 DIAGNOSIS — D509 Iron deficiency anemia, unspecified: Secondary | ICD-10-CM | POA: Diagnosis not present

## 2018-09-13 DIAGNOSIS — N186 End stage renal disease: Secondary | ICD-10-CM | POA: Diagnosis not present

## 2018-09-13 DIAGNOSIS — D631 Anemia in chronic kidney disease: Secondary | ICD-10-CM | POA: Diagnosis not present

## 2018-09-14 DIAGNOSIS — Z452 Encounter for adjustment and management of vascular access device: Secondary | ICD-10-CM | POA: Diagnosis not present

## 2018-09-15 DIAGNOSIS — N186 End stage renal disease: Secondary | ICD-10-CM | POA: Diagnosis not present

## 2018-09-15 DIAGNOSIS — D509 Iron deficiency anemia, unspecified: Secondary | ICD-10-CM | POA: Diagnosis not present

## 2018-09-15 DIAGNOSIS — N2581 Secondary hyperparathyroidism of renal origin: Secondary | ICD-10-CM | POA: Diagnosis not present

## 2018-09-15 DIAGNOSIS — D631 Anemia in chronic kidney disease: Secondary | ICD-10-CM | POA: Diagnosis not present

## 2018-09-15 DIAGNOSIS — Z992 Dependence on renal dialysis: Secondary | ICD-10-CM | POA: Diagnosis not present

## 2018-09-16 ENCOUNTER — Other Ambulatory Visit: Payer: Medicare Other

## 2018-09-17 ENCOUNTER — Ambulatory Visit: Payer: Medicare Other

## 2018-09-17 DIAGNOSIS — N186 End stage renal disease: Secondary | ICD-10-CM | POA: Diagnosis not present

## 2018-09-17 DIAGNOSIS — N2581 Secondary hyperparathyroidism of renal origin: Secondary | ICD-10-CM | POA: Diagnosis not present

## 2018-09-17 DIAGNOSIS — D509 Iron deficiency anemia, unspecified: Secondary | ICD-10-CM | POA: Diagnosis not present

## 2018-09-17 DIAGNOSIS — Z992 Dependence on renal dialysis: Secondary | ICD-10-CM | POA: Diagnosis not present

## 2018-09-17 DIAGNOSIS — D631 Anemia in chronic kidney disease: Secondary | ICD-10-CM | POA: Diagnosis not present

## 2018-09-20 DIAGNOSIS — Z992 Dependence on renal dialysis: Secondary | ICD-10-CM | POA: Diagnosis not present

## 2018-09-20 DIAGNOSIS — N2581 Secondary hyperparathyroidism of renal origin: Secondary | ICD-10-CM | POA: Diagnosis not present

## 2018-09-20 DIAGNOSIS — D509 Iron deficiency anemia, unspecified: Secondary | ICD-10-CM | POA: Diagnosis not present

## 2018-09-20 DIAGNOSIS — N186 End stage renal disease: Secondary | ICD-10-CM | POA: Diagnosis not present

## 2018-09-20 DIAGNOSIS — D631 Anemia in chronic kidney disease: Secondary | ICD-10-CM | POA: Diagnosis not present

## 2018-09-22 DIAGNOSIS — D509 Iron deficiency anemia, unspecified: Secondary | ICD-10-CM | POA: Diagnosis not present

## 2018-09-22 DIAGNOSIS — Z992 Dependence on renal dialysis: Secondary | ICD-10-CM | POA: Diagnosis not present

## 2018-09-22 DIAGNOSIS — N186 End stage renal disease: Secondary | ICD-10-CM | POA: Diagnosis not present

## 2018-09-22 DIAGNOSIS — D631 Anemia in chronic kidney disease: Secondary | ICD-10-CM | POA: Diagnosis not present

## 2018-09-22 DIAGNOSIS — N2581 Secondary hyperparathyroidism of renal origin: Secondary | ICD-10-CM | POA: Diagnosis not present

## 2018-09-25 DIAGNOSIS — N2581 Secondary hyperparathyroidism of renal origin: Secondary | ICD-10-CM | POA: Diagnosis not present

## 2018-09-25 DIAGNOSIS — Z992 Dependence on renal dialysis: Secondary | ICD-10-CM | POA: Diagnosis not present

## 2018-09-25 DIAGNOSIS — D509 Iron deficiency anemia, unspecified: Secondary | ICD-10-CM | POA: Diagnosis not present

## 2018-09-25 DIAGNOSIS — N186 End stage renal disease: Secondary | ICD-10-CM | POA: Diagnosis not present

## 2018-09-25 DIAGNOSIS — D631 Anemia in chronic kidney disease: Secondary | ICD-10-CM | POA: Diagnosis not present

## 2018-09-25 DIAGNOSIS — I129 Hypertensive chronic kidney disease with stage 1 through stage 4 chronic kidney disease, or unspecified chronic kidney disease: Secondary | ICD-10-CM | POA: Diagnosis not present

## 2018-09-27 DIAGNOSIS — Z992 Dependence on renal dialysis: Secondary | ICD-10-CM | POA: Diagnosis not present

## 2018-09-27 DIAGNOSIS — N186 End stage renal disease: Secondary | ICD-10-CM | POA: Diagnosis not present

## 2018-09-27 DIAGNOSIS — D631 Anemia in chronic kidney disease: Secondary | ICD-10-CM | POA: Diagnosis not present

## 2018-09-27 DIAGNOSIS — D509 Iron deficiency anemia, unspecified: Secondary | ICD-10-CM | POA: Diagnosis not present

## 2018-09-27 DIAGNOSIS — N2581 Secondary hyperparathyroidism of renal origin: Secondary | ICD-10-CM | POA: Diagnosis not present

## 2018-09-28 DIAGNOSIS — D509 Iron deficiency anemia, unspecified: Secondary | ICD-10-CM | POA: Diagnosis not present

## 2018-09-28 DIAGNOSIS — N2581 Secondary hyperparathyroidism of renal origin: Secondary | ICD-10-CM | POA: Diagnosis not present

## 2018-09-28 DIAGNOSIS — Z992 Dependence on renal dialysis: Secondary | ICD-10-CM | POA: Diagnosis not present

## 2018-09-28 DIAGNOSIS — D631 Anemia in chronic kidney disease: Secondary | ICD-10-CM | POA: Diagnosis not present

## 2018-09-28 DIAGNOSIS — N186 End stage renal disease: Secondary | ICD-10-CM | POA: Diagnosis not present

## 2018-10-02 DIAGNOSIS — D509 Iron deficiency anemia, unspecified: Secondary | ICD-10-CM | POA: Diagnosis not present

## 2018-10-02 DIAGNOSIS — N2581 Secondary hyperparathyroidism of renal origin: Secondary | ICD-10-CM | POA: Diagnosis not present

## 2018-10-02 DIAGNOSIS — D631 Anemia in chronic kidney disease: Secondary | ICD-10-CM | POA: Diagnosis not present

## 2018-10-02 DIAGNOSIS — Z992 Dependence on renal dialysis: Secondary | ICD-10-CM | POA: Diagnosis not present

## 2018-10-02 DIAGNOSIS — N186 End stage renal disease: Secondary | ICD-10-CM | POA: Diagnosis not present

## 2018-10-05 DIAGNOSIS — N2581 Secondary hyperparathyroidism of renal origin: Secondary | ICD-10-CM | POA: Diagnosis not present

## 2018-10-05 DIAGNOSIS — N186 End stage renal disease: Secondary | ICD-10-CM | POA: Diagnosis not present

## 2018-10-05 DIAGNOSIS — Z992 Dependence on renal dialysis: Secondary | ICD-10-CM | POA: Diagnosis not present

## 2018-10-05 DIAGNOSIS — D631 Anemia in chronic kidney disease: Secondary | ICD-10-CM | POA: Diagnosis not present

## 2018-10-05 DIAGNOSIS — D509 Iron deficiency anemia, unspecified: Secondary | ICD-10-CM | POA: Diagnosis not present

## 2018-10-06 DIAGNOSIS — N186 End stage renal disease: Secondary | ICD-10-CM | POA: Diagnosis not present

## 2018-10-06 DIAGNOSIS — D631 Anemia in chronic kidney disease: Secondary | ICD-10-CM | POA: Diagnosis not present

## 2018-10-06 DIAGNOSIS — Z992 Dependence on renal dialysis: Secondary | ICD-10-CM | POA: Diagnosis not present

## 2018-10-06 DIAGNOSIS — N2581 Secondary hyperparathyroidism of renal origin: Secondary | ICD-10-CM | POA: Diagnosis not present

## 2018-10-06 DIAGNOSIS — D509 Iron deficiency anemia, unspecified: Secondary | ICD-10-CM | POA: Diagnosis not present

## 2018-10-07 ENCOUNTER — Ambulatory Visit
Admission: RE | Admit: 2018-10-07 | Discharge: 2018-10-07 | Disposition: A | Discharge status: Home / Self Care | Payer: Medicare Other | Source: Ambulatory Visit | Attending: Internal Medicine | Admitting: Internal Medicine

## 2018-10-07 DIAGNOSIS — Z1231 Encounter for screening mammogram for malignant neoplasm of breast: Secondary | ICD-10-CM | POA: Diagnosis not present

## 2018-10-07 DIAGNOSIS — Z1239 Encounter for other screening for malignant neoplasm of breast: Secondary | ICD-10-CM

## 2018-10-08 DIAGNOSIS — Z992 Dependence on renal dialysis: Secondary | ICD-10-CM | POA: Diagnosis not present

## 2018-10-08 DIAGNOSIS — D631 Anemia in chronic kidney disease: Secondary | ICD-10-CM | POA: Diagnosis not present

## 2018-10-08 DIAGNOSIS — N2581 Secondary hyperparathyroidism of renal origin: Secondary | ICD-10-CM | POA: Diagnosis not present

## 2018-10-08 DIAGNOSIS — N186 End stage renal disease: Secondary | ICD-10-CM | POA: Diagnosis not present

## 2018-10-08 DIAGNOSIS — D509 Iron deficiency anemia, unspecified: Secondary | ICD-10-CM | POA: Diagnosis not present

## 2018-10-08 IMAGING — MR MR CERVICAL SPINE W/O CM
4 of 5 series · 19 of 48 positions shown · non-contrast
Comparison: Cervical spine radiograph 05/04/2018

CLINICAL DATA: Cervical radiculopathy.

EXAM:
MRI CERVICAL SPINE WITHOUT CONTRAST
TECHNIQUE: Multiplanar, multisequence MR imaging of the cervical spine was
performed. No intravenous contrast was administered.

[Series 3: FLAIR · sagittal · 3.0mm · 0.43mm/px · 3 of 17 slices shown]
[im 3/17]
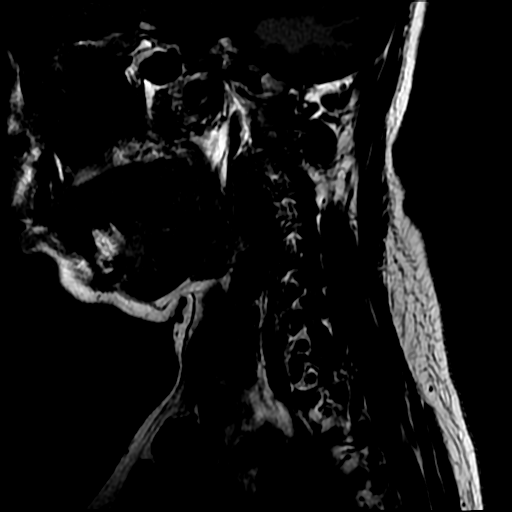
[im 9/17]
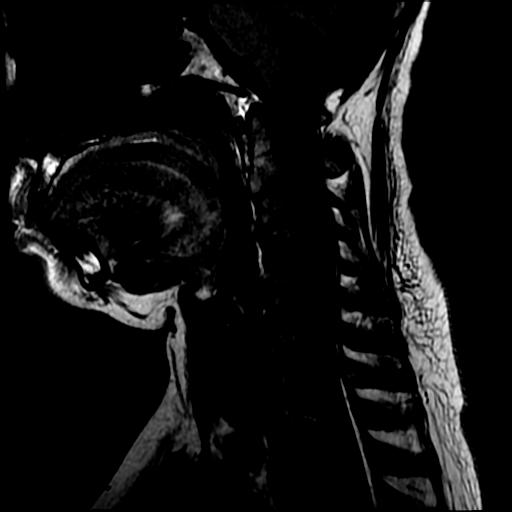
[im 14/17]
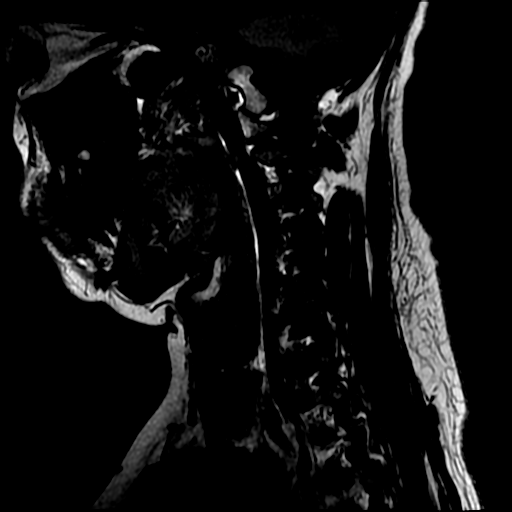

[Series 4: STIR · sagittal · 3.0mm · 0.43mm/px · 3 of 17 slices shown]
[im 3/17]
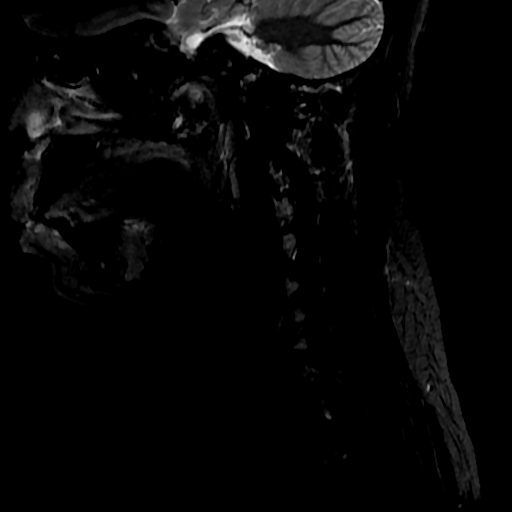
[im 9/17]
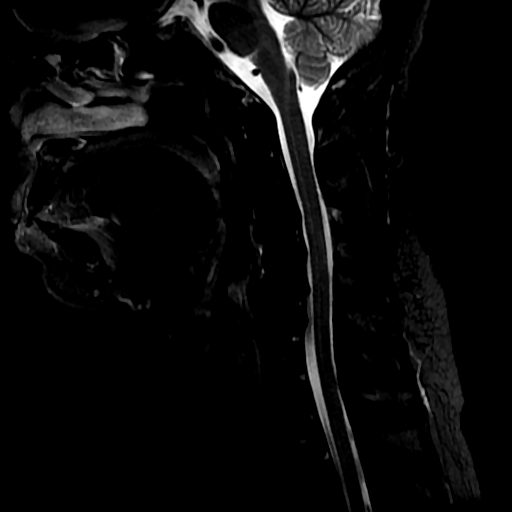
[im 14/17]
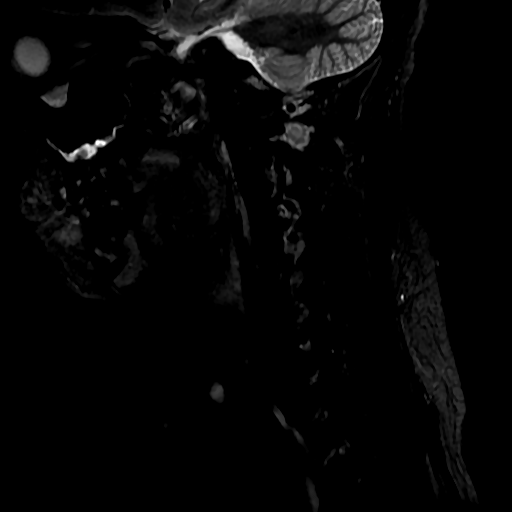

[Series 6: T2 · axial · 3.0mm · 0.35mm/px · z∈[-69,+27]mm · 9 of 31 slices shown (1 of 2)]
[im 1/31]
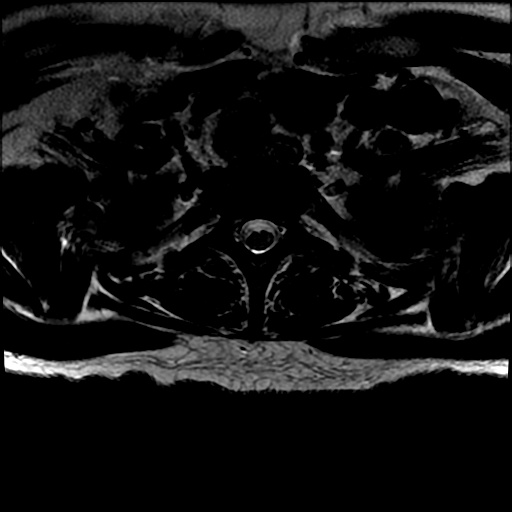
[im 6/31]
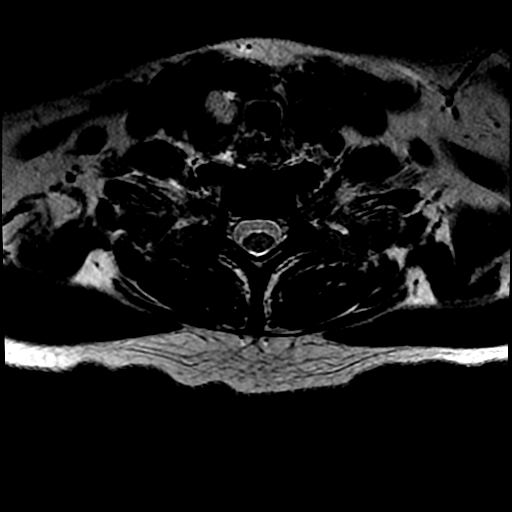
[im 9/31]
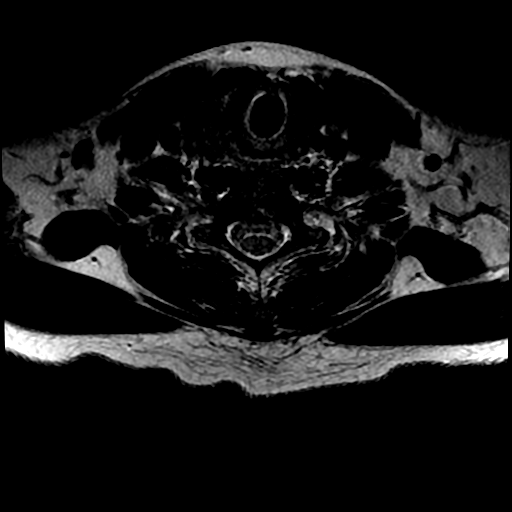
[im 14/31]
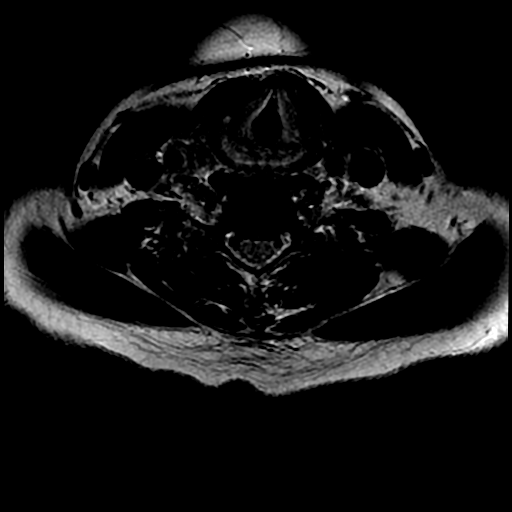
[im 17/31]
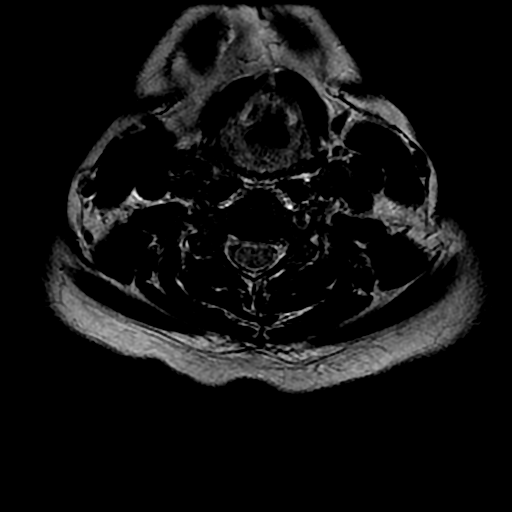
[im 22/31]
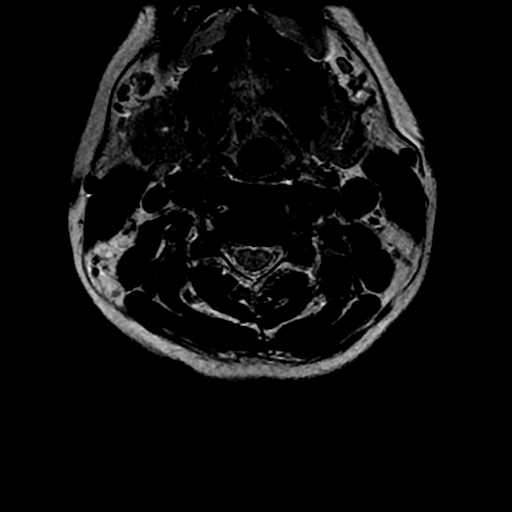
[im 25/31]
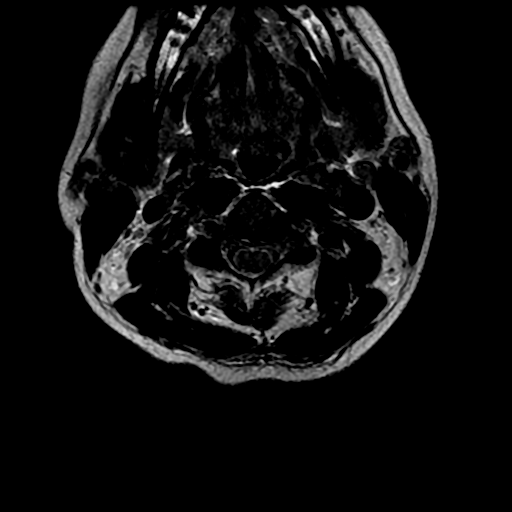
[im 28/31]
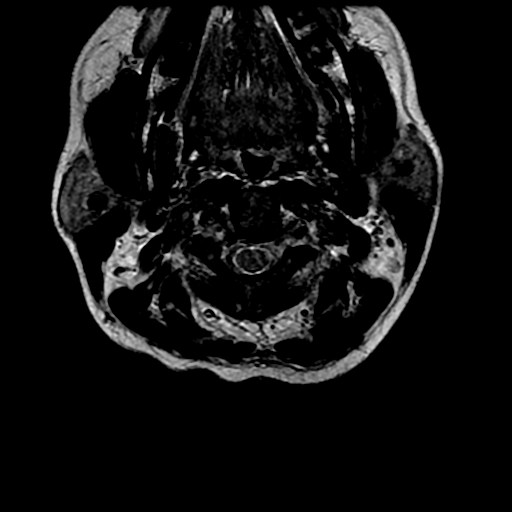
[im 31/31]
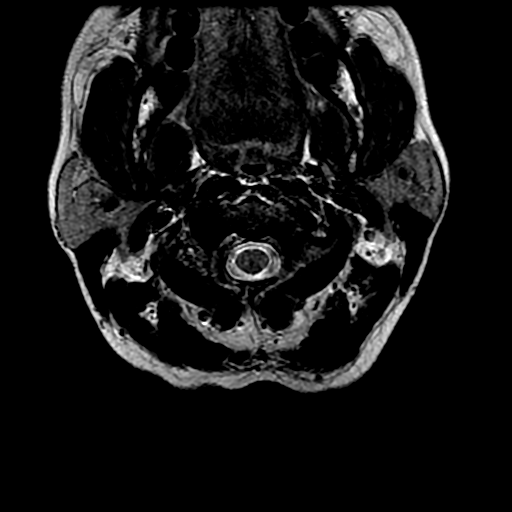

[Series 7: T2 · sagittal · 3.0mm · 0.43mm/px · 4 of 17 slices shown (2 of 2)]
[im 1/17]
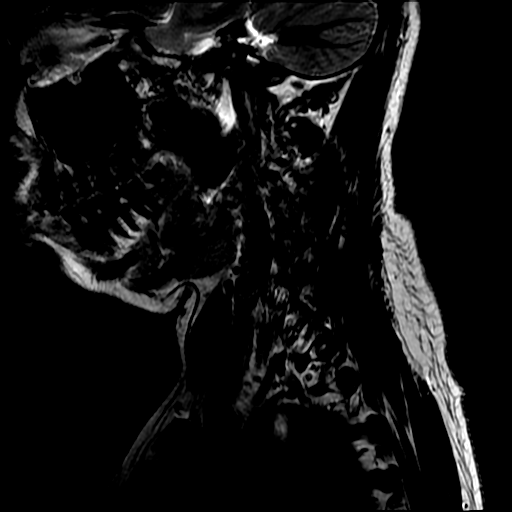
[im 3/17]
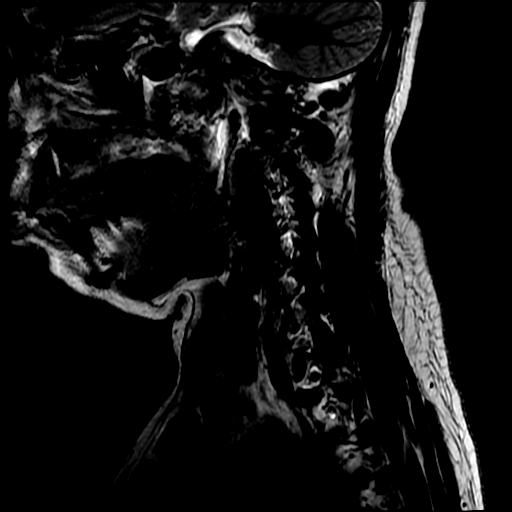
[im 9/17]
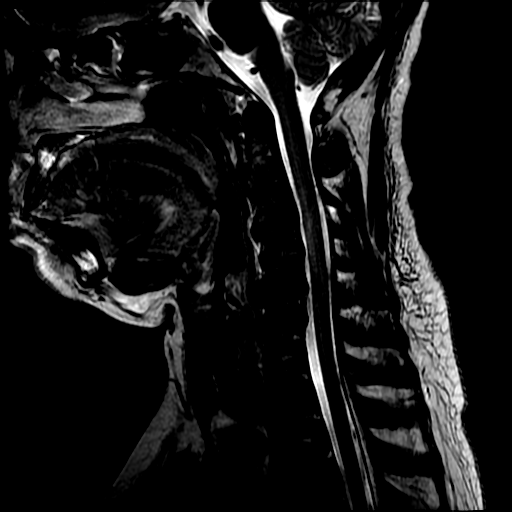
[im 14/17]
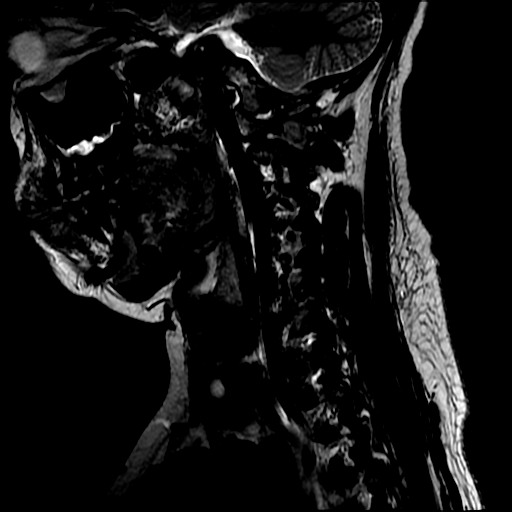

[19 of 48 positions shown; findings below may reference images not displayed]

FINDINGS: Alignment: There is reversal of normal cervical lordosis. No static
subluxation.

Vertebrae: No fracture, evidence of discitis, or bone lesion.

Cord: Normal signal and morphology.

Posterior Fossa, vertebral arteries, paraspinal tissues: Visualized
posterior fossa is normal. Vertebral artery flow voids are
preserved. No prevertebral soft tissue swelling.

Disc levels:

The C1-2 articulation is normal.

C2-C3: Normal.

C3-C4: Mild disc space narrowing without spinal canal or neural
foraminal stenosis.

C4-C5: Normal.

C5-C6: Anterior osteophyte formation and left-greater-than-right
uncovertebral hypertrophy. There is moderate-to-severe left neural
foraminal stenosis. No spinal canal or right foraminal stenosis.

C6-C7: Mild bilateral uncovertebral hypertrophy. Mild left foraminal
stenosis. No spinal canal stenosis.

C7-T1: Normal.

T1-T2: Normal.

T2-T4 levels are imaged in sagittal plane only, but are normal.
IMPRESSION: 1. Moderate-to-severe left C5-6 and mild left C6-7 neural foraminal
stenosis secondary to uncovertebral hypertrophy.
2. No cervical spinal canal stenosis.

## 2018-10-11 DIAGNOSIS — D631 Anemia in chronic kidney disease: Secondary | ICD-10-CM | POA: Diagnosis not present

## 2018-10-11 DIAGNOSIS — N2581 Secondary hyperparathyroidism of renal origin: Secondary | ICD-10-CM | POA: Diagnosis not present

## 2018-10-11 DIAGNOSIS — Z992 Dependence on renal dialysis: Secondary | ICD-10-CM | POA: Diagnosis not present

## 2018-10-11 DIAGNOSIS — N186 End stage renal disease: Secondary | ICD-10-CM | POA: Diagnosis not present

## 2018-10-11 DIAGNOSIS — D509 Iron deficiency anemia, unspecified: Secondary | ICD-10-CM | POA: Diagnosis not present

## 2018-10-13 ENCOUNTER — Telehealth: Payer: Self-pay

## 2018-10-13 DIAGNOSIS — D509 Iron deficiency anemia, unspecified: Secondary | ICD-10-CM | POA: Diagnosis not present

## 2018-10-13 DIAGNOSIS — N186 End stage renal disease: Secondary | ICD-10-CM | POA: Diagnosis not present

## 2018-10-13 DIAGNOSIS — Z992 Dependence on renal dialysis: Secondary | ICD-10-CM | POA: Diagnosis not present

## 2018-10-13 DIAGNOSIS — D631 Anemia in chronic kidney disease: Secondary | ICD-10-CM | POA: Diagnosis not present

## 2018-10-13 DIAGNOSIS — N2581 Secondary hyperparathyroidism of renal origin: Secondary | ICD-10-CM | POA: Diagnosis not present

## 2018-10-13 NOTE — Telephone Encounter (Signed)
Contacted pt to go over MM results pt is aware and doesn't have any questions or concerns

## 2018-10-15 DIAGNOSIS — N186 End stage renal disease: Secondary | ICD-10-CM | POA: Diagnosis not present

## 2018-10-15 DIAGNOSIS — N2581 Secondary hyperparathyroidism of renal origin: Secondary | ICD-10-CM | POA: Diagnosis not present

## 2018-10-15 DIAGNOSIS — D631 Anemia in chronic kidney disease: Secondary | ICD-10-CM | POA: Diagnosis not present

## 2018-10-15 DIAGNOSIS — D509 Iron deficiency anemia, unspecified: Secondary | ICD-10-CM | POA: Diagnosis not present

## 2018-10-15 DIAGNOSIS — Z992 Dependence on renal dialysis: Secondary | ICD-10-CM | POA: Diagnosis not present

## 2018-10-18 DIAGNOSIS — D509 Iron deficiency anemia, unspecified: Secondary | ICD-10-CM | POA: Diagnosis not present

## 2018-10-18 DIAGNOSIS — N2581 Secondary hyperparathyroidism of renal origin: Secondary | ICD-10-CM | POA: Diagnosis not present

## 2018-10-18 DIAGNOSIS — Z992 Dependence on renal dialysis: Secondary | ICD-10-CM | POA: Diagnosis not present

## 2018-10-18 DIAGNOSIS — D631 Anemia in chronic kidney disease: Secondary | ICD-10-CM | POA: Diagnosis not present

## 2018-10-18 DIAGNOSIS — N186 End stage renal disease: Secondary | ICD-10-CM | POA: Diagnosis not present

## 2018-10-20 DIAGNOSIS — N2581 Secondary hyperparathyroidism of renal origin: Secondary | ICD-10-CM | POA: Diagnosis not present

## 2018-10-20 DIAGNOSIS — N186 End stage renal disease: Secondary | ICD-10-CM | POA: Diagnosis not present

## 2018-10-20 DIAGNOSIS — D509 Iron deficiency anemia, unspecified: Secondary | ICD-10-CM | POA: Diagnosis not present

## 2018-10-20 DIAGNOSIS — D631 Anemia in chronic kidney disease: Secondary | ICD-10-CM | POA: Diagnosis not present

## 2018-10-20 DIAGNOSIS — Z992 Dependence on renal dialysis: Secondary | ICD-10-CM | POA: Diagnosis not present

## 2018-10-22 DIAGNOSIS — D631 Anemia in chronic kidney disease: Secondary | ICD-10-CM | POA: Diagnosis not present

## 2018-10-22 DIAGNOSIS — D509 Iron deficiency anemia, unspecified: Secondary | ICD-10-CM | POA: Diagnosis not present

## 2018-10-22 DIAGNOSIS — N186 End stage renal disease: Secondary | ICD-10-CM | POA: Diagnosis not present

## 2018-10-22 DIAGNOSIS — Z992 Dependence on renal dialysis: Secondary | ICD-10-CM | POA: Diagnosis not present

## 2018-10-22 DIAGNOSIS — N2581 Secondary hyperparathyroidism of renal origin: Secondary | ICD-10-CM | POA: Diagnosis not present

## 2018-10-24 DIAGNOSIS — Z992 Dependence on renal dialysis: Secondary | ICD-10-CM | POA: Diagnosis not present

## 2018-10-24 DIAGNOSIS — N186 End stage renal disease: Secondary | ICD-10-CM | POA: Diagnosis not present

## 2018-10-24 DIAGNOSIS — I129 Hypertensive chronic kidney disease with stage 1 through stage 4 chronic kidney disease, or unspecified chronic kidney disease: Secondary | ICD-10-CM | POA: Diagnosis not present

## 2018-10-25 DIAGNOSIS — D509 Iron deficiency anemia, unspecified: Secondary | ICD-10-CM | POA: Diagnosis not present

## 2018-10-25 DIAGNOSIS — N186 End stage renal disease: Secondary | ICD-10-CM | POA: Diagnosis not present

## 2018-10-25 DIAGNOSIS — N2581 Secondary hyperparathyroidism of renal origin: Secondary | ICD-10-CM | POA: Diagnosis not present

## 2018-10-25 DIAGNOSIS — D631 Anemia in chronic kidney disease: Secondary | ICD-10-CM | POA: Diagnosis not present

## 2018-10-27 DIAGNOSIS — D631 Anemia in chronic kidney disease: Secondary | ICD-10-CM | POA: Diagnosis not present

## 2018-10-27 DIAGNOSIS — N2581 Secondary hyperparathyroidism of renal origin: Secondary | ICD-10-CM | POA: Diagnosis not present

## 2018-10-27 DIAGNOSIS — N186 End stage renal disease: Secondary | ICD-10-CM | POA: Diagnosis not present

## 2018-10-27 DIAGNOSIS — D509 Iron deficiency anemia, unspecified: Secondary | ICD-10-CM | POA: Diagnosis not present

## 2018-10-28 DIAGNOSIS — D631 Anemia in chronic kidney disease: Secondary | ICD-10-CM | POA: Diagnosis not present

## 2018-10-28 DIAGNOSIS — N186 End stage renal disease: Secondary | ICD-10-CM | POA: Diagnosis not present

## 2018-10-28 DIAGNOSIS — N2581 Secondary hyperparathyroidism of renal origin: Secondary | ICD-10-CM | POA: Diagnosis not present

## 2018-10-28 DIAGNOSIS — D509 Iron deficiency anemia, unspecified: Secondary | ICD-10-CM | POA: Diagnosis not present

## 2018-11-01 DIAGNOSIS — D509 Iron deficiency anemia, unspecified: Secondary | ICD-10-CM | POA: Diagnosis not present

## 2018-11-01 DIAGNOSIS — N2581 Secondary hyperparathyroidism of renal origin: Secondary | ICD-10-CM | POA: Diagnosis not present

## 2018-11-01 DIAGNOSIS — D631 Anemia in chronic kidney disease: Secondary | ICD-10-CM | POA: Diagnosis not present

## 2018-11-01 DIAGNOSIS — N186 End stage renal disease: Secondary | ICD-10-CM | POA: Diagnosis not present

## 2018-11-03 DIAGNOSIS — D509 Iron deficiency anemia, unspecified: Secondary | ICD-10-CM | POA: Diagnosis not present

## 2018-11-03 DIAGNOSIS — N186 End stage renal disease: Secondary | ICD-10-CM | POA: Diagnosis not present

## 2018-11-03 DIAGNOSIS — N2581 Secondary hyperparathyroidism of renal origin: Secondary | ICD-10-CM | POA: Diagnosis not present

## 2018-11-03 DIAGNOSIS — D631 Anemia in chronic kidney disease: Secondary | ICD-10-CM | POA: Diagnosis not present

## 2018-11-06 DIAGNOSIS — D631 Anemia in chronic kidney disease: Secondary | ICD-10-CM | POA: Diagnosis not present

## 2018-11-06 DIAGNOSIS — N2581 Secondary hyperparathyroidism of renal origin: Secondary | ICD-10-CM | POA: Diagnosis not present

## 2018-11-06 DIAGNOSIS — N186 End stage renal disease: Secondary | ICD-10-CM | POA: Diagnosis not present

## 2018-11-06 DIAGNOSIS — D509 Iron deficiency anemia, unspecified: Secondary | ICD-10-CM | POA: Diagnosis not present

## 2018-11-08 DIAGNOSIS — D631 Anemia in chronic kidney disease: Secondary | ICD-10-CM | POA: Diagnosis not present

## 2018-11-08 DIAGNOSIS — N2581 Secondary hyperparathyroidism of renal origin: Secondary | ICD-10-CM | POA: Diagnosis not present

## 2018-11-08 DIAGNOSIS — D509 Iron deficiency anemia, unspecified: Secondary | ICD-10-CM | POA: Diagnosis not present

## 2018-11-08 DIAGNOSIS — N186 End stage renal disease: Secondary | ICD-10-CM | POA: Diagnosis not present

## 2018-11-10 DIAGNOSIS — N2581 Secondary hyperparathyroidism of renal origin: Secondary | ICD-10-CM | POA: Diagnosis not present

## 2018-11-10 DIAGNOSIS — D631 Anemia in chronic kidney disease: Secondary | ICD-10-CM | POA: Diagnosis not present

## 2018-11-10 DIAGNOSIS — D509 Iron deficiency anemia, unspecified: Secondary | ICD-10-CM | POA: Diagnosis not present

## 2018-11-10 DIAGNOSIS — N186 End stage renal disease: Secondary | ICD-10-CM | POA: Diagnosis not present

## 2018-11-13 DIAGNOSIS — D509 Iron deficiency anemia, unspecified: Secondary | ICD-10-CM | POA: Diagnosis not present

## 2018-11-13 DIAGNOSIS — N186 End stage renal disease: Secondary | ICD-10-CM | POA: Diagnosis not present

## 2018-11-13 DIAGNOSIS — D631 Anemia in chronic kidney disease: Secondary | ICD-10-CM | POA: Diagnosis not present

## 2018-11-13 DIAGNOSIS — N2581 Secondary hyperparathyroidism of renal origin: Secondary | ICD-10-CM | POA: Diagnosis not present

## 2018-11-15 DIAGNOSIS — D509 Iron deficiency anemia, unspecified: Secondary | ICD-10-CM | POA: Diagnosis not present

## 2018-11-15 DIAGNOSIS — N2581 Secondary hyperparathyroidism of renal origin: Secondary | ICD-10-CM | POA: Diagnosis not present

## 2018-11-15 DIAGNOSIS — D631 Anemia in chronic kidney disease: Secondary | ICD-10-CM | POA: Diagnosis not present

## 2018-11-15 DIAGNOSIS — N186 End stage renal disease: Secondary | ICD-10-CM | POA: Diagnosis not present

## 2018-11-17 DIAGNOSIS — N2581 Secondary hyperparathyroidism of renal origin: Secondary | ICD-10-CM | POA: Diagnosis not present

## 2018-11-17 DIAGNOSIS — D631 Anemia in chronic kidney disease: Secondary | ICD-10-CM | POA: Diagnosis not present

## 2018-11-17 DIAGNOSIS — D509 Iron deficiency anemia, unspecified: Secondary | ICD-10-CM | POA: Diagnosis not present

## 2018-11-17 DIAGNOSIS — N186 End stage renal disease: Secondary | ICD-10-CM | POA: Diagnosis not present

## 2018-11-19 DIAGNOSIS — D631 Anemia in chronic kidney disease: Secondary | ICD-10-CM | POA: Diagnosis not present

## 2018-11-19 DIAGNOSIS — N186 End stage renal disease: Secondary | ICD-10-CM | POA: Diagnosis not present

## 2018-11-19 DIAGNOSIS — N2581 Secondary hyperparathyroidism of renal origin: Secondary | ICD-10-CM | POA: Diagnosis not present

## 2018-11-19 DIAGNOSIS — D509 Iron deficiency anemia, unspecified: Secondary | ICD-10-CM | POA: Diagnosis not present

## 2018-11-22 DIAGNOSIS — N2581 Secondary hyperparathyroidism of renal origin: Secondary | ICD-10-CM | POA: Diagnosis not present

## 2018-11-22 DIAGNOSIS — D509 Iron deficiency anemia, unspecified: Secondary | ICD-10-CM | POA: Diagnosis not present

## 2018-11-22 DIAGNOSIS — D631 Anemia in chronic kidney disease: Secondary | ICD-10-CM | POA: Diagnosis not present

## 2018-11-22 DIAGNOSIS — N186 End stage renal disease: Secondary | ICD-10-CM | POA: Diagnosis not present

## 2018-11-24 DIAGNOSIS — N186 End stage renal disease: Secondary | ICD-10-CM | POA: Diagnosis not present

## 2018-11-24 DIAGNOSIS — N2581 Secondary hyperparathyroidism of renal origin: Secondary | ICD-10-CM | POA: Diagnosis not present

## 2018-11-24 DIAGNOSIS — I129 Hypertensive chronic kidney disease with stage 1 through stage 4 chronic kidney disease, or unspecified chronic kidney disease: Secondary | ICD-10-CM | POA: Diagnosis not present

## 2018-11-24 DIAGNOSIS — D509 Iron deficiency anemia, unspecified: Secondary | ICD-10-CM | POA: Diagnosis not present

## 2018-11-24 DIAGNOSIS — Z992 Dependence on renal dialysis: Secondary | ICD-10-CM | POA: Diagnosis not present

## 2018-11-27 DIAGNOSIS — N2581 Secondary hyperparathyroidism of renal origin: Secondary | ICD-10-CM | POA: Diagnosis not present

## 2018-11-27 DIAGNOSIS — D509 Iron deficiency anemia, unspecified: Secondary | ICD-10-CM | POA: Diagnosis not present

## 2018-11-27 DIAGNOSIS — N186 End stage renal disease: Secondary | ICD-10-CM | POA: Diagnosis not present

## 2018-11-29 DIAGNOSIS — N186 End stage renal disease: Secondary | ICD-10-CM | POA: Diagnosis not present

## 2018-11-29 DIAGNOSIS — D509 Iron deficiency anemia, unspecified: Secondary | ICD-10-CM | POA: Diagnosis not present

## 2018-11-29 DIAGNOSIS — N2581 Secondary hyperparathyroidism of renal origin: Secondary | ICD-10-CM | POA: Diagnosis not present

## 2018-12-01 DIAGNOSIS — D509 Iron deficiency anemia, unspecified: Secondary | ICD-10-CM | POA: Diagnosis not present

## 2018-12-01 DIAGNOSIS — N2581 Secondary hyperparathyroidism of renal origin: Secondary | ICD-10-CM | POA: Diagnosis not present

## 2018-12-01 DIAGNOSIS — N186 End stage renal disease: Secondary | ICD-10-CM | POA: Diagnosis not present

## 2018-12-03 DIAGNOSIS — N2581 Secondary hyperparathyroidism of renal origin: Secondary | ICD-10-CM | POA: Diagnosis not present

## 2018-12-03 DIAGNOSIS — D509 Iron deficiency anemia, unspecified: Secondary | ICD-10-CM | POA: Diagnosis not present

## 2018-12-03 DIAGNOSIS — N186 End stage renal disease: Secondary | ICD-10-CM | POA: Diagnosis not present

## 2018-12-07 DIAGNOSIS — N2581 Secondary hyperparathyroidism of renal origin: Secondary | ICD-10-CM | POA: Diagnosis not present

## 2018-12-07 DIAGNOSIS — D509 Iron deficiency anemia, unspecified: Secondary | ICD-10-CM | POA: Diagnosis not present

## 2018-12-07 DIAGNOSIS — N186 End stage renal disease: Secondary | ICD-10-CM | POA: Diagnosis not present

## 2018-12-08 DIAGNOSIS — D509 Iron deficiency anemia, unspecified: Secondary | ICD-10-CM | POA: Diagnosis not present

## 2018-12-08 DIAGNOSIS — N186 End stage renal disease: Secondary | ICD-10-CM | POA: Diagnosis not present

## 2018-12-08 DIAGNOSIS — N2581 Secondary hyperparathyroidism of renal origin: Secondary | ICD-10-CM | POA: Diagnosis not present

## 2018-12-10 DIAGNOSIS — D509 Iron deficiency anemia, unspecified: Secondary | ICD-10-CM | POA: Diagnosis not present

## 2018-12-10 DIAGNOSIS — N186 End stage renal disease: Secondary | ICD-10-CM | POA: Diagnosis not present

## 2018-12-10 DIAGNOSIS — N2581 Secondary hyperparathyroidism of renal origin: Secondary | ICD-10-CM | POA: Diagnosis not present

## 2018-12-13 DIAGNOSIS — D509 Iron deficiency anemia, unspecified: Secondary | ICD-10-CM | POA: Diagnosis not present

## 2018-12-13 DIAGNOSIS — N186 End stage renal disease: Secondary | ICD-10-CM | POA: Diagnosis not present

## 2018-12-13 DIAGNOSIS — N2581 Secondary hyperparathyroidism of renal origin: Secondary | ICD-10-CM | POA: Diagnosis not present

## 2018-12-15 DIAGNOSIS — N2581 Secondary hyperparathyroidism of renal origin: Secondary | ICD-10-CM | POA: Diagnosis not present

## 2018-12-15 DIAGNOSIS — N186 End stage renal disease: Secondary | ICD-10-CM | POA: Diagnosis not present

## 2018-12-15 DIAGNOSIS — D509 Iron deficiency anemia, unspecified: Secondary | ICD-10-CM | POA: Diagnosis not present

## 2018-12-18 DIAGNOSIS — D509 Iron deficiency anemia, unspecified: Secondary | ICD-10-CM | POA: Diagnosis not present

## 2018-12-18 DIAGNOSIS — N2581 Secondary hyperparathyroidism of renal origin: Secondary | ICD-10-CM | POA: Diagnosis not present

## 2018-12-18 DIAGNOSIS — N186 End stage renal disease: Secondary | ICD-10-CM | POA: Diagnosis not present

## 2018-12-20 DIAGNOSIS — N186 End stage renal disease: Secondary | ICD-10-CM | POA: Diagnosis not present

## 2018-12-20 DIAGNOSIS — N2581 Secondary hyperparathyroidism of renal origin: Secondary | ICD-10-CM | POA: Diagnosis not present

## 2018-12-20 DIAGNOSIS — D509 Iron deficiency anemia, unspecified: Secondary | ICD-10-CM | POA: Diagnosis not present

## 2018-12-22 DIAGNOSIS — S92355A Nondisplaced fracture of fifth metatarsal bone, left foot, initial encounter for closed fracture: Secondary | ICD-10-CM | POA: Diagnosis not present

## 2018-12-22 DIAGNOSIS — M79672 Pain in left foot: Secondary | ICD-10-CM | POA: Diagnosis not present

## 2018-12-22 IMAGING — US US FNA BIOPSY THYROID 1ST LESION
1 series · 13 of 19 positions shown · non-contrast
Comparison: Thyroid ultrasound performed 04/28/2018

MEDICATIONS:
None

COMPLICATIONS:
None immediate.

INDICATION: Indeterminate right mid and left mid thyroid nodules

EXAM:
ULTRASOUND GUIDED FINE NEEDLE ASPIRATION OF INDETERMINATE RIGHT MID
AND LEFT MID THYROID NODULES
TECHNIQUE: Informed written consent was obtained from the patient after a
discussion of the risks, benefits and alternatives to treatment.
Questions regarding the procedure were encouraged and answered. A
timeout was performed prior to the initiation of the procedure.

[Series 1: us fna biopsy thyroid 1st lesion · 0.06mm/px · 19 acquisitions, 13 frames shown]
[im 1/19]
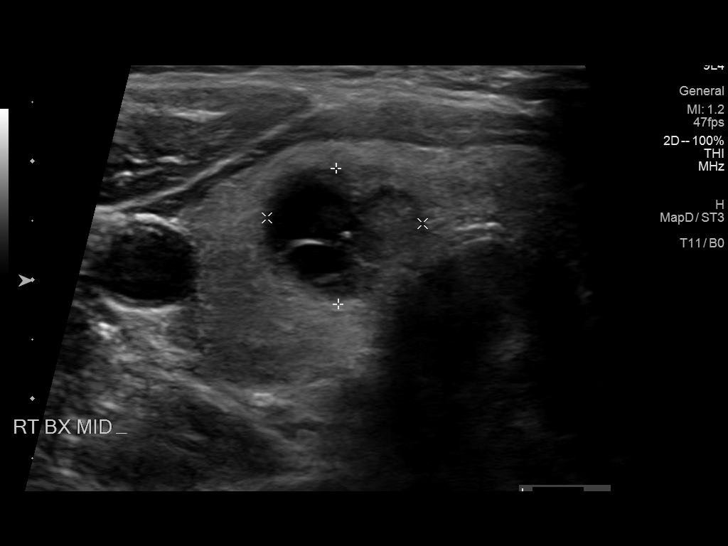
[im 3/19]
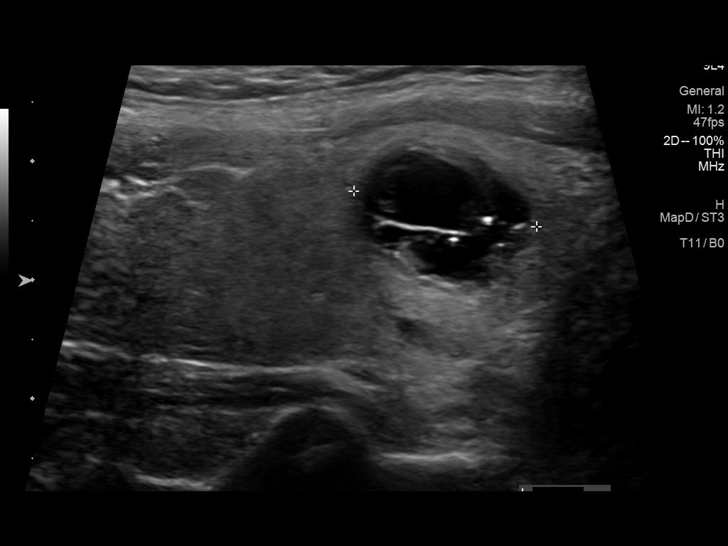
[im 4/19]
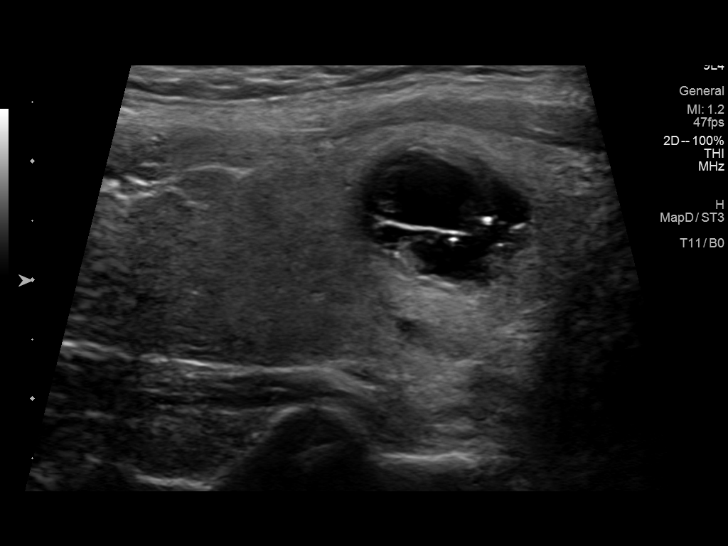
[im 6/19]
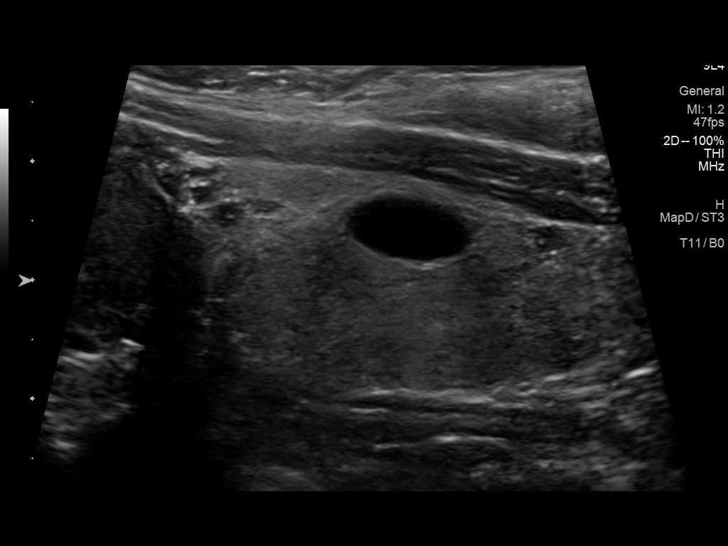
[im 7/19]
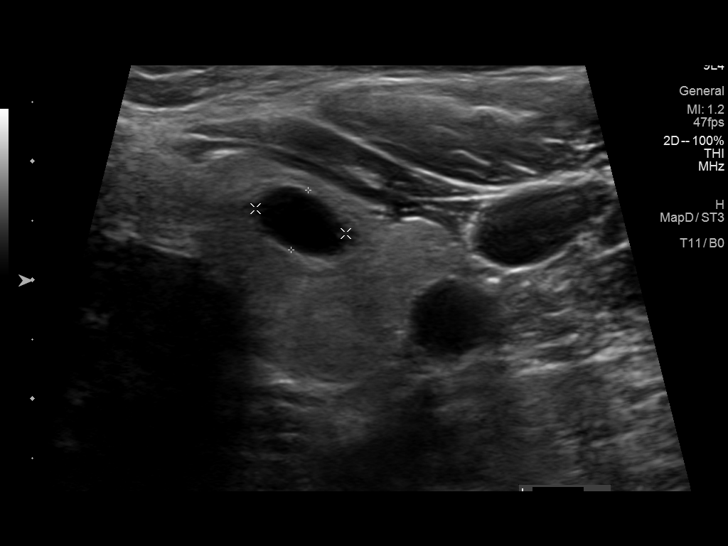
[im 9/19]
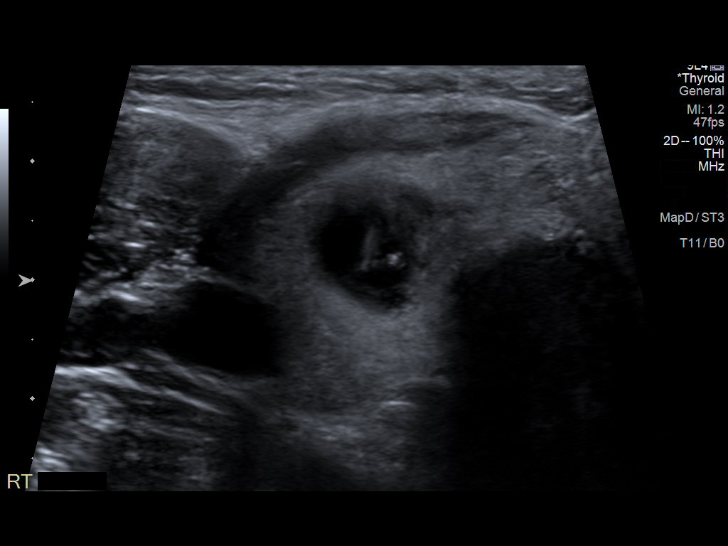
[im 10/19]
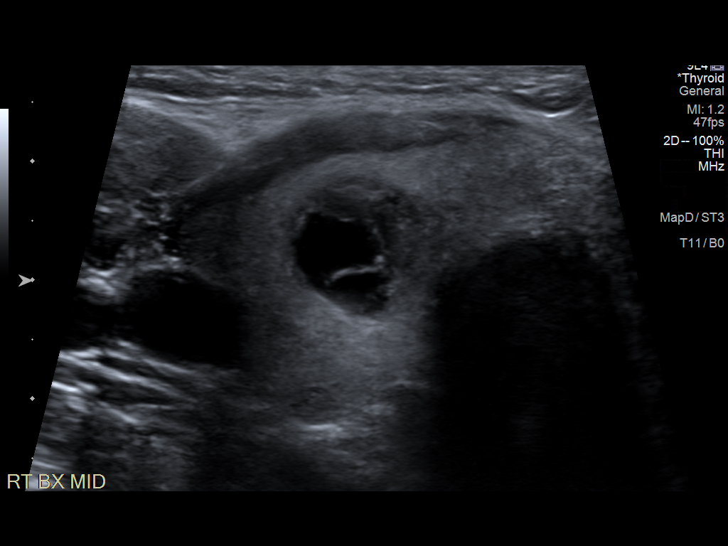
[im 11/19]
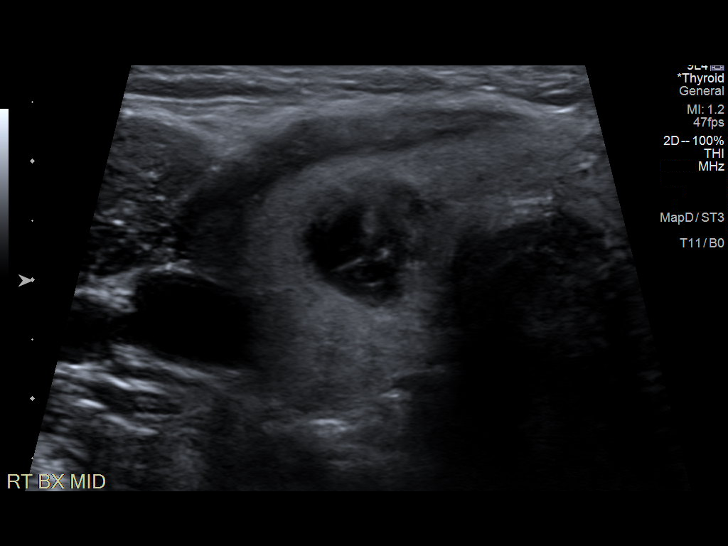
[im 13/19]
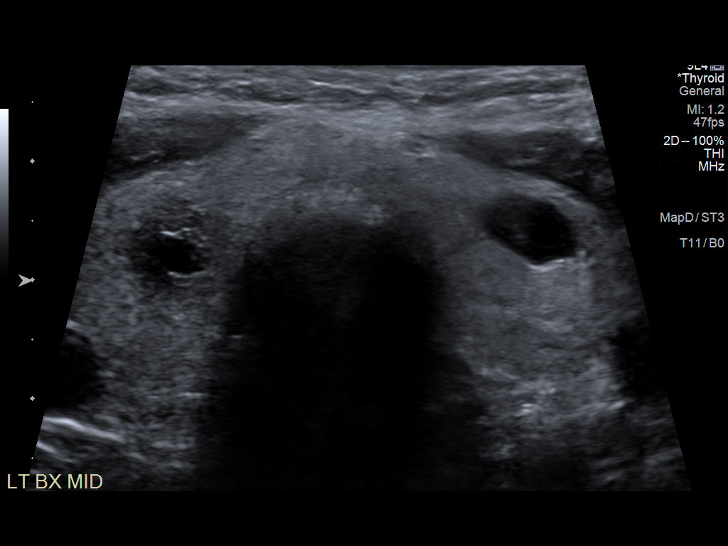
[im 14/19]
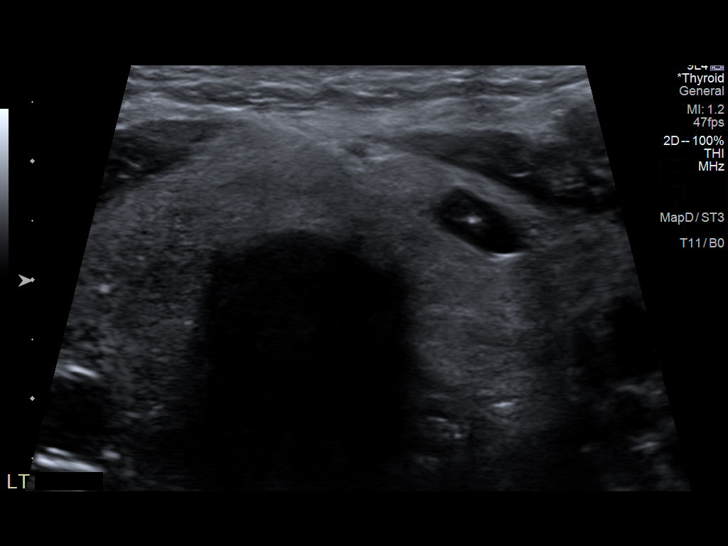
[im 16/19]
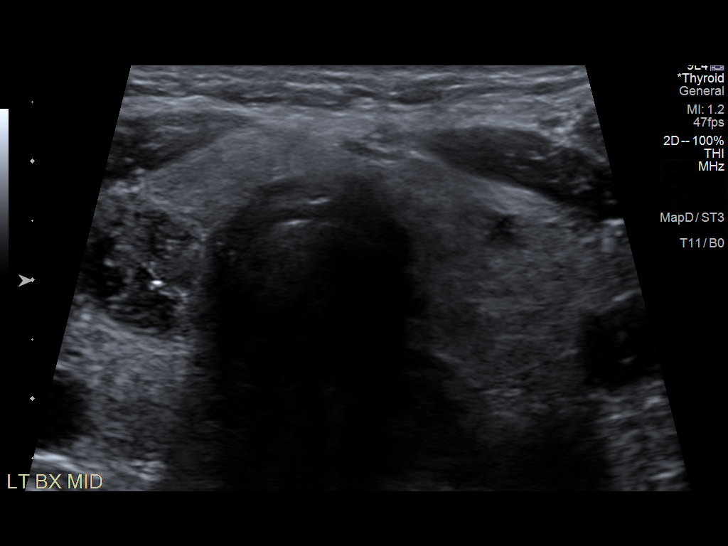
[im 17/19]
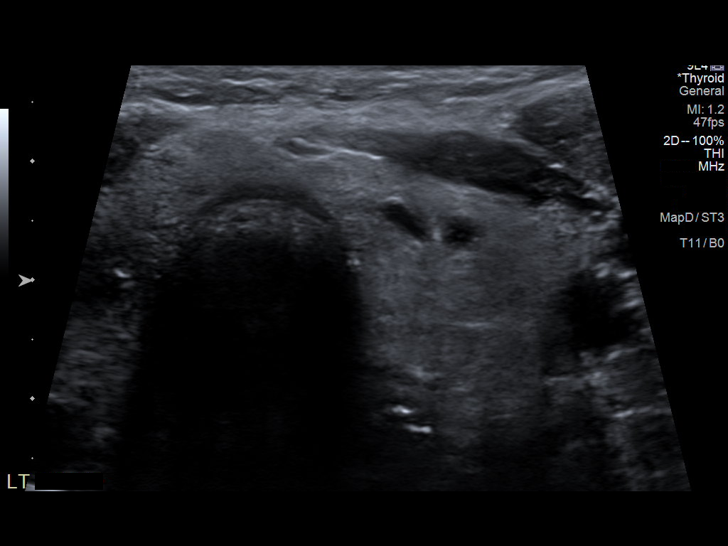
[im 19/19]
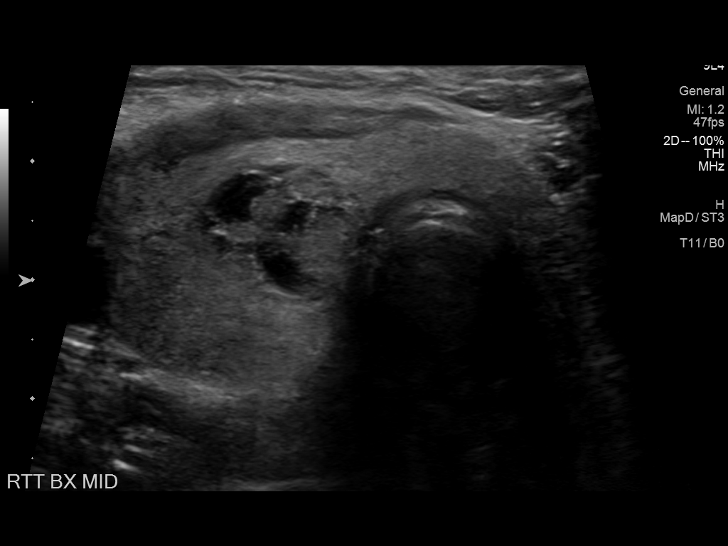

[13 of 19 positions shown; findings below may reference images not displayed]

Pre-procedural ultrasound scanning demonstrated unchanged size and
appearance of the indeterminate nodules within the right and left
mid thyroid lobes.

The procedure was planned. The neck was prepped in the usual sterile
fashion, and a sterile drape was applied covering the operative
field. A timeout was performed prior to the initiation of the
procedure. Local anesthesia was provided with 1% lidocaine.

Under direct ultrasound guidance, 5 FNA biopsies were performed of
the right mid thyroid nodule with a 25 gauge needle. Multiple
ultrasound images were saved for procedural documentation purposes.
The samples were prepared and submitted to pathology.

Under direct ultrasound guidance, 5 FNA biopsies were performed of
the left mid thyroid nodule with a 25 gauge needle. Multiple
ultrasound images were saved for procedural documentation purposes.
The samples were prepared and submitted to pathology.

Limited post procedural scanning was negative for hematoma or
additional complication. Dressings were placed. The patient
tolerated the above procedures procedure well without immediate
postprocedural complication.
FINDINGS: FINDINGS
Nodule reference number based on prior diagnostic ultrasound: 2

Maximum size: 1.7 cm

Location: Right  ;  Mid

ACR TI-RADS total points: 4

ACR TI-RADS risk category:   TR4 (4-6 points)

Prior biopsy:  No

Reason for biopsy: meets ACR TI-RADS criteria

_________________________________________________________

Nodule reference number based on prior diagnostic ultrasound: 4

Maximum size: 1.0 cm

Location: Left  ;  Mid

ACR TI-RADS total points: 7

ACR TI-RADS risk category:   TR5 (>/= 7 points)

Prior biopsy:  No

Reason for biopsy: meets ACR TI-RADS criteria

Ultrasound imaging confirms appropriate placement of the needles
within the thyroid nodule.
IMPRESSION: 1. Technically successful ultrasound guided fine needle aspiration
of right mid thyroid nodule as described above.
2. Technically successful ultrasound guided fine needle aspiration
of left mid thyroid nodule as described above.

## 2018-12-23 DIAGNOSIS — N2581 Secondary hyperparathyroidism of renal origin: Secondary | ICD-10-CM | POA: Diagnosis not present

## 2018-12-23 DIAGNOSIS — D509 Iron deficiency anemia, unspecified: Secondary | ICD-10-CM | POA: Diagnosis not present

## 2018-12-23 DIAGNOSIS — N186 End stage renal disease: Secondary | ICD-10-CM | POA: Diagnosis not present

## 2018-12-24 DIAGNOSIS — Z992 Dependence on renal dialysis: Secondary | ICD-10-CM | POA: Diagnosis not present

## 2018-12-24 DIAGNOSIS — I129 Hypertensive chronic kidney disease with stage 1 through stage 4 chronic kidney disease, or unspecified chronic kidney disease: Secondary | ICD-10-CM | POA: Diagnosis not present

## 2018-12-24 DIAGNOSIS — N186 End stage renal disease: Secondary | ICD-10-CM | POA: Diagnosis not present

## 2018-12-24 DIAGNOSIS — N2581 Secondary hyperparathyroidism of renal origin: Secondary | ICD-10-CM | POA: Diagnosis not present

## 2018-12-24 DIAGNOSIS — D631 Anemia in chronic kidney disease: Secondary | ICD-10-CM | POA: Diagnosis not present

## 2018-12-27 DIAGNOSIS — N186 End stage renal disease: Secondary | ICD-10-CM | POA: Diagnosis not present

## 2018-12-27 DIAGNOSIS — D631 Anemia in chronic kidney disease: Secondary | ICD-10-CM | POA: Diagnosis not present

## 2018-12-27 DIAGNOSIS — N2581 Secondary hyperparathyroidism of renal origin: Secondary | ICD-10-CM | POA: Diagnosis not present

## 2018-12-29 DIAGNOSIS — N2581 Secondary hyperparathyroidism of renal origin: Secondary | ICD-10-CM | POA: Diagnosis not present

## 2018-12-29 DIAGNOSIS — N186 End stage renal disease: Secondary | ICD-10-CM | POA: Diagnosis not present

## 2018-12-29 DIAGNOSIS — D631 Anemia in chronic kidney disease: Secondary | ICD-10-CM | POA: Diagnosis not present

## 2018-12-31 DIAGNOSIS — D631 Anemia in chronic kidney disease: Secondary | ICD-10-CM | POA: Diagnosis not present

## 2018-12-31 DIAGNOSIS — N186 End stage renal disease: Secondary | ICD-10-CM | POA: Diagnosis not present

## 2018-12-31 DIAGNOSIS — N2581 Secondary hyperparathyroidism of renal origin: Secondary | ICD-10-CM | POA: Diagnosis not present

## 2019-01-04 DIAGNOSIS — N186 End stage renal disease: Secondary | ICD-10-CM | POA: Diagnosis not present

## 2019-01-04 DIAGNOSIS — N2581 Secondary hyperparathyroidism of renal origin: Secondary | ICD-10-CM | POA: Diagnosis not present

## 2019-01-04 DIAGNOSIS — D631 Anemia in chronic kidney disease: Secondary | ICD-10-CM | POA: Diagnosis not present

## 2019-01-05 DIAGNOSIS — D631 Anemia in chronic kidney disease: Secondary | ICD-10-CM | POA: Diagnosis not present

## 2019-01-05 DIAGNOSIS — N2581 Secondary hyperparathyroidism of renal origin: Secondary | ICD-10-CM | POA: Diagnosis not present

## 2019-01-05 DIAGNOSIS — N186 End stage renal disease: Secondary | ICD-10-CM | POA: Diagnosis not present

## 2019-01-08 DIAGNOSIS — N186 End stage renal disease: Secondary | ICD-10-CM | POA: Diagnosis not present

## 2019-01-08 DIAGNOSIS — D631 Anemia in chronic kidney disease: Secondary | ICD-10-CM | POA: Diagnosis not present

## 2019-01-08 DIAGNOSIS — N2581 Secondary hyperparathyroidism of renal origin: Secondary | ICD-10-CM | POA: Diagnosis not present

## 2019-01-10 DIAGNOSIS — I871 Compression of vein: Secondary | ICD-10-CM | POA: Diagnosis not present

## 2019-01-10 DIAGNOSIS — T82868A Thrombosis of vascular prosthetic devices, implants and grafts, initial encounter: Secondary | ICD-10-CM | POA: Diagnosis not present

## 2019-01-10 DIAGNOSIS — N186 End stage renal disease: Secondary | ICD-10-CM | POA: Diagnosis not present

## 2019-01-10 DIAGNOSIS — Z992 Dependence on renal dialysis: Secondary | ICD-10-CM | POA: Diagnosis not present

## 2019-01-11 DIAGNOSIS — D631 Anemia in chronic kidney disease: Secondary | ICD-10-CM | POA: Diagnosis not present

## 2019-01-11 DIAGNOSIS — N186 End stage renal disease: Secondary | ICD-10-CM | POA: Diagnosis not present

## 2019-01-11 DIAGNOSIS — N2581 Secondary hyperparathyroidism of renal origin: Secondary | ICD-10-CM | POA: Diagnosis not present

## 2019-01-12 DIAGNOSIS — N2581 Secondary hyperparathyroidism of renal origin: Secondary | ICD-10-CM | POA: Diagnosis not present

## 2019-01-12 DIAGNOSIS — N186 End stage renal disease: Secondary | ICD-10-CM | POA: Diagnosis not present

## 2019-01-12 DIAGNOSIS — D631 Anemia in chronic kidney disease: Secondary | ICD-10-CM | POA: Diagnosis not present

## 2019-01-14 DIAGNOSIS — D631 Anemia in chronic kidney disease: Secondary | ICD-10-CM | POA: Diagnosis not present

## 2019-01-14 DIAGNOSIS — N2581 Secondary hyperparathyroidism of renal origin: Secondary | ICD-10-CM | POA: Diagnosis not present

## 2019-01-14 DIAGNOSIS — N186 End stage renal disease: Secondary | ICD-10-CM | POA: Diagnosis not present

## 2019-01-17 DIAGNOSIS — D631 Anemia in chronic kidney disease: Secondary | ICD-10-CM | POA: Diagnosis not present

## 2019-01-17 DIAGNOSIS — N2581 Secondary hyperparathyroidism of renal origin: Secondary | ICD-10-CM | POA: Diagnosis not present

## 2019-01-17 DIAGNOSIS — N186 End stage renal disease: Secondary | ICD-10-CM | POA: Diagnosis not present

## 2019-01-19 DIAGNOSIS — N2581 Secondary hyperparathyroidism of renal origin: Secondary | ICD-10-CM | POA: Diagnosis not present

## 2019-01-19 DIAGNOSIS — D631 Anemia in chronic kidney disease: Secondary | ICD-10-CM | POA: Diagnosis not present

## 2019-01-19 DIAGNOSIS — N186 End stage renal disease: Secondary | ICD-10-CM | POA: Diagnosis not present

## 2019-01-24 DIAGNOSIS — Z992 Dependence on renal dialysis: Secondary | ICD-10-CM | POA: Diagnosis not present

## 2019-01-24 DIAGNOSIS — D631 Anemia in chronic kidney disease: Secondary | ICD-10-CM | POA: Diagnosis not present

## 2019-01-24 DIAGNOSIS — N2581 Secondary hyperparathyroidism of renal origin: Secondary | ICD-10-CM | POA: Diagnosis not present

## 2019-01-24 DIAGNOSIS — N186 End stage renal disease: Secondary | ICD-10-CM | POA: Diagnosis not present

## 2019-01-24 DIAGNOSIS — I129 Hypertensive chronic kidney disease with stage 1 through stage 4 chronic kidney disease, or unspecified chronic kidney disease: Secondary | ICD-10-CM | POA: Diagnosis not present

## 2019-01-26 DIAGNOSIS — D631 Anemia in chronic kidney disease: Secondary | ICD-10-CM | POA: Diagnosis not present

## 2019-01-26 DIAGNOSIS — N186 End stage renal disease: Secondary | ICD-10-CM | POA: Diagnosis not present

## 2019-01-26 DIAGNOSIS — N2581 Secondary hyperparathyroidism of renal origin: Secondary | ICD-10-CM | POA: Diagnosis not present

## 2019-01-28 DIAGNOSIS — N2581 Secondary hyperparathyroidism of renal origin: Secondary | ICD-10-CM | POA: Diagnosis not present

## 2019-01-28 DIAGNOSIS — D631 Anemia in chronic kidney disease: Secondary | ICD-10-CM | POA: Diagnosis not present

## 2019-01-28 DIAGNOSIS — N186 End stage renal disease: Secondary | ICD-10-CM | POA: Diagnosis not present

## 2019-01-31 DIAGNOSIS — N186 End stage renal disease: Secondary | ICD-10-CM | POA: Diagnosis not present

## 2019-01-31 DIAGNOSIS — D631 Anemia in chronic kidney disease: Secondary | ICD-10-CM | POA: Diagnosis not present

## 2019-01-31 DIAGNOSIS — N2581 Secondary hyperparathyroidism of renal origin: Secondary | ICD-10-CM | POA: Diagnosis not present

## 2019-02-02 DIAGNOSIS — N2581 Secondary hyperparathyroidism of renal origin: Secondary | ICD-10-CM | POA: Diagnosis not present

## 2019-02-02 DIAGNOSIS — D631 Anemia in chronic kidney disease: Secondary | ICD-10-CM | POA: Diagnosis not present

## 2019-02-02 DIAGNOSIS — N186 End stage renal disease: Secondary | ICD-10-CM | POA: Diagnosis not present

## 2019-02-03 ENCOUNTER — Encounter: Payer: Self-pay | Admitting: Nephrology

## 2019-02-04 DIAGNOSIS — N186 End stage renal disease: Secondary | ICD-10-CM | POA: Diagnosis not present

## 2019-02-04 DIAGNOSIS — D631 Anemia in chronic kidney disease: Secondary | ICD-10-CM | POA: Diagnosis not present

## 2019-02-04 DIAGNOSIS — N2581 Secondary hyperparathyroidism of renal origin: Secondary | ICD-10-CM | POA: Diagnosis not present

## 2019-02-07 DIAGNOSIS — D631 Anemia in chronic kidney disease: Secondary | ICD-10-CM | POA: Diagnosis not present

## 2019-02-07 DIAGNOSIS — N186 End stage renal disease: Secondary | ICD-10-CM | POA: Diagnosis not present

## 2019-02-07 DIAGNOSIS — N2581 Secondary hyperparathyroidism of renal origin: Secondary | ICD-10-CM | POA: Diagnosis not present

## 2019-02-10 DIAGNOSIS — N186 End stage renal disease: Secondary | ICD-10-CM | POA: Diagnosis not present

## 2019-02-10 DIAGNOSIS — D631 Anemia in chronic kidney disease: Secondary | ICD-10-CM | POA: Diagnosis not present

## 2019-02-10 DIAGNOSIS — N2581 Secondary hyperparathyroidism of renal origin: Secondary | ICD-10-CM | POA: Diagnosis not present

## 2019-02-14 DIAGNOSIS — N186 End stage renal disease: Secondary | ICD-10-CM | POA: Diagnosis not present

## 2019-02-14 DIAGNOSIS — I871 Compression of vein: Secondary | ICD-10-CM | POA: Diagnosis not present

## 2019-02-14 DIAGNOSIS — T82868A Thrombosis of vascular prosthetic devices, implants and grafts, initial encounter: Secondary | ICD-10-CM | POA: Diagnosis not present

## 2019-02-14 DIAGNOSIS — Z992 Dependence on renal dialysis: Secondary | ICD-10-CM | POA: Diagnosis not present

## 2019-02-15 DIAGNOSIS — N2581 Secondary hyperparathyroidism of renal origin: Secondary | ICD-10-CM | POA: Diagnosis not present

## 2019-02-15 DIAGNOSIS — D631 Anemia in chronic kidney disease: Secondary | ICD-10-CM | POA: Diagnosis not present

## 2019-02-15 DIAGNOSIS — N186 End stage renal disease: Secondary | ICD-10-CM | POA: Diagnosis not present

## 2019-02-16 DIAGNOSIS — N186 End stage renal disease: Secondary | ICD-10-CM | POA: Diagnosis not present

## 2019-02-16 DIAGNOSIS — D631 Anemia in chronic kidney disease: Secondary | ICD-10-CM | POA: Diagnosis not present

## 2019-02-16 DIAGNOSIS — N2581 Secondary hyperparathyroidism of renal origin: Secondary | ICD-10-CM | POA: Diagnosis not present

## 2019-02-18 DIAGNOSIS — I10 Essential (primary) hypertension: Secondary | ICD-10-CM | POA: Diagnosis not present

## 2019-02-18 DIAGNOSIS — D631 Anemia in chronic kidney disease: Secondary | ICD-10-CM | POA: Diagnosis present

## 2019-02-18 DIAGNOSIS — Z01810 Encounter for preprocedural cardiovascular examination: Secondary | ICD-10-CM | POA: Diagnosis not present

## 2019-02-18 DIAGNOSIS — M109 Gout, unspecified: Secondary | ICD-10-CM | POA: Diagnosis present

## 2019-02-18 DIAGNOSIS — R739 Hyperglycemia, unspecified: Secondary | ICD-10-CM | POA: Diagnosis not present

## 2019-02-18 DIAGNOSIS — R110 Nausea: Secondary | ICD-10-CM | POA: Diagnosis not present

## 2019-02-18 DIAGNOSIS — Z87891 Personal history of nicotine dependence: Secondary | ICD-10-CM | POA: Diagnosis not present

## 2019-02-18 DIAGNOSIS — Z992 Dependence on renal dialysis: Secondary | ICD-10-CM | POA: Diagnosis not present

## 2019-02-18 DIAGNOSIS — Z1159 Encounter for screening for other viral diseases: Secondary | ICD-10-CM | POA: Diagnosis not present

## 2019-02-18 DIAGNOSIS — G43909 Migraine, unspecified, not intractable, without status migrainosus: Secondary | ICD-10-CM | POA: Diagnosis present

## 2019-02-18 DIAGNOSIS — E876 Hypokalemia: Secondary | ICD-10-CM | POA: Diagnosis not present

## 2019-02-18 DIAGNOSIS — Z886 Allergy status to analgesic agent status: Secondary | ICD-10-CM | POA: Diagnosis not present

## 2019-02-18 DIAGNOSIS — Z4822 Encounter for aftercare following kidney transplant: Secondary | ICD-10-CM | POA: Diagnosis not present

## 2019-02-18 DIAGNOSIS — Z01818 Encounter for other preprocedural examination: Secondary | ICD-10-CM | POA: Diagnosis not present

## 2019-02-18 DIAGNOSIS — Z940 Kidney transplant status: Secondary | ICD-10-CM | POA: Diagnosis not present

## 2019-02-18 DIAGNOSIS — E871 Hypo-osmolality and hyponatremia: Secondary | ICD-10-CM | POA: Diagnosis not present

## 2019-02-18 DIAGNOSIS — I951 Orthostatic hypotension: Secondary | ICD-10-CM | POA: Diagnosis not present

## 2019-02-18 DIAGNOSIS — I1311 Hypertensive heart and chronic kidney disease without heart failure, with stage 5 chronic kidney disease, or end stage renal disease: Secondary | ICD-10-CM | POA: Diagnosis not present

## 2019-02-18 DIAGNOSIS — N186 End stage renal disease: Secondary | ICD-10-CM | POA: Diagnosis not present

## 2019-02-18 DIAGNOSIS — I120 Hypertensive chronic kidney disease with stage 5 chronic kidney disease or end stage renal disease: Secondary | ICD-10-CM | POA: Diagnosis present

## 2019-02-18 DIAGNOSIS — D72829 Elevated white blood cell count, unspecified: Secondary | ICD-10-CM | POA: Diagnosis not present

## 2019-02-18 DIAGNOSIS — K5900 Constipation, unspecified: Secondary | ICD-10-CM | POA: Diagnosis present

## 2019-02-18 DIAGNOSIS — J90 Pleural effusion, not elsewhere classified: Secondary | ICD-10-CM | POA: Diagnosis not present

## 2019-02-18 DIAGNOSIS — N179 Acute kidney failure, unspecified: Secondary | ICD-10-CM | POA: Diagnosis not present

## 2019-02-18 DIAGNOSIS — Z7982 Long term (current) use of aspirin: Secondary | ICD-10-CM | POA: Diagnosis not present

## 2019-02-18 DIAGNOSIS — N2581 Secondary hyperparathyroidism of renal origin: Secondary | ICD-10-CM | POA: Diagnosis not present

## 2019-02-18 DIAGNOSIS — R918 Other nonspecific abnormal finding of lung field: Secondary | ICD-10-CM | POA: Diagnosis not present

## 2019-02-18 DIAGNOSIS — Z7682 Awaiting organ transplant status: Secondary | ICD-10-CM | POA: Diagnosis not present

## 2019-02-18 DIAGNOSIS — Z5181 Encounter for therapeutic drug level monitoring: Secondary | ICD-10-CM | POA: Diagnosis not present

## 2019-02-18 DIAGNOSIS — E873 Alkalosis: Secondary | ICD-10-CM | POA: Diagnosis not present

## 2019-02-18 DIAGNOSIS — Z79899 Other long term (current) drug therapy: Secondary | ICD-10-CM | POA: Diagnosis not present

## 2019-02-18 DIAGNOSIS — R40241 Glasgow coma scale score 13-15, unspecified time: Secondary | ICD-10-CM | POA: Diagnosis not present

## 2019-02-18 HISTORY — PX: KIDNEY TRANSPLANT: SHX239

## 2019-02-23 DIAGNOSIS — E785 Hyperlipidemia, unspecified: Secondary | ICD-10-CM | POA: Diagnosis not present

## 2019-02-23 DIAGNOSIS — E872 Acidosis: Secondary | ICD-10-CM | POA: Diagnosis not present

## 2019-02-23 DIAGNOSIS — D649 Anemia, unspecified: Secondary | ICD-10-CM | POA: Diagnosis not present

## 2019-02-23 DIAGNOSIS — T8619 Other complication of kidney transplant: Secondary | ICD-10-CM | POA: Diagnosis not present

## 2019-02-23 DIAGNOSIS — Z940 Kidney transplant status: Secondary | ICD-10-CM | POA: Diagnosis not present

## 2019-02-23 DIAGNOSIS — D849 Immunodeficiency, unspecified: Secondary | ICD-10-CM | POA: Insufficient documentation

## 2019-02-23 DIAGNOSIS — Z79899 Other long term (current) drug therapy: Secondary | ICD-10-CM | POA: Diagnosis not present

## 2019-02-23 DIAGNOSIS — I10 Essential (primary) hypertension: Secondary | ICD-10-CM | POA: Diagnosis not present

## 2019-02-25 DIAGNOSIS — N186 End stage renal disease: Secondary | ICD-10-CM | POA: Diagnosis not present

## 2019-02-25 DIAGNOSIS — I120 Hypertensive chronic kidney disease with stage 5 chronic kidney disease or end stage renal disease: Secondary | ICD-10-CM | POA: Diagnosis not present

## 2019-02-25 DIAGNOSIS — Z79899 Other long term (current) drug therapy: Secondary | ICD-10-CM | POA: Diagnosis not present

## 2019-02-26 DIAGNOSIS — I10 Essential (primary) hypertension: Secondary | ICD-10-CM | POA: Diagnosis not present

## 2019-02-26 DIAGNOSIS — E872 Acidosis: Secondary | ICD-10-CM | POA: Diagnosis not present

## 2019-02-26 DIAGNOSIS — Z4822 Encounter for aftercare following kidney transplant: Secondary | ICD-10-CM | POA: Diagnosis not present

## 2019-02-26 DIAGNOSIS — Z5181 Encounter for therapeutic drug level monitoring: Secondary | ICD-10-CM | POA: Diagnosis not present

## 2019-02-26 DIAGNOSIS — T8619 Other complication of kidney transplant: Secondary | ICD-10-CM | POA: Diagnosis not present

## 2019-02-26 DIAGNOSIS — Z940 Kidney transplant status: Secondary | ICD-10-CM | POA: Diagnosis not present

## 2019-02-26 DIAGNOSIS — Z792 Long term (current) use of antibiotics: Secondary | ICD-10-CM | POA: Diagnosis not present

## 2019-02-26 DIAGNOSIS — E871 Hypo-osmolality and hyponatremia: Secondary | ICD-10-CM | POA: Diagnosis not present

## 2019-02-26 DIAGNOSIS — Z79899 Other long term (current) drug therapy: Secondary | ICD-10-CM | POA: Diagnosis not present

## 2019-02-27 DIAGNOSIS — I10 Essential (primary) hypertension: Secondary | ICD-10-CM | POA: Diagnosis not present

## 2019-02-27 DIAGNOSIS — I120 Hypertensive chronic kidney disease with stage 5 chronic kidney disease or end stage renal disease: Secondary | ICD-10-CM | POA: Diagnosis not present

## 2019-02-27 DIAGNOSIS — Z940 Kidney transplant status: Secondary | ICD-10-CM | POA: Diagnosis not present

## 2019-02-27 DIAGNOSIS — E871 Hypo-osmolality and hyponatremia: Secondary | ICD-10-CM | POA: Diagnosis not present

## 2019-02-27 DIAGNOSIS — R140 Abdominal distension (gaseous): Secondary | ICD-10-CM | POA: Diagnosis not present

## 2019-02-27 DIAGNOSIS — E872 Acidosis: Secondary | ICD-10-CM | POA: Diagnosis not present

## 2019-02-27 DIAGNOSIS — N186 End stage renal disease: Secondary | ICD-10-CM | POA: Diagnosis not present

## 2019-02-27 DIAGNOSIS — Z79899 Other long term (current) drug therapy: Secondary | ICD-10-CM | POA: Diagnosis not present

## 2019-02-28 DIAGNOSIS — R918 Other nonspecific abnormal finding of lung field: Secondary | ICD-10-CM | POA: Diagnosis not present

## 2019-02-28 DIAGNOSIS — E871 Hypo-osmolality and hyponatremia: Secondary | ICD-10-CM | POA: Diagnosis not present

## 2019-02-28 DIAGNOSIS — R9431 Abnormal electrocardiogram [ECG] [EKG]: Secondary | ICD-10-CM | POA: Diagnosis not present

## 2019-02-28 DIAGNOSIS — Z7952 Long term (current) use of systemic steroids: Secondary | ICD-10-CM | POA: Diagnosis not present

## 2019-02-28 DIAGNOSIS — R140 Abdominal distension (gaseous): Secondary | ICD-10-CM | POA: Diagnosis not present

## 2019-02-28 DIAGNOSIS — Z992 Dependence on renal dialysis: Secondary | ICD-10-CM | POA: Diagnosis not present

## 2019-02-28 DIAGNOSIS — I120 Hypertensive chronic kidney disease with stage 5 chronic kidney disease or end stage renal disease: Secondary | ICD-10-CM | POA: Diagnosis not present

## 2019-02-28 DIAGNOSIS — Z4822 Encounter for aftercare following kidney transplant: Secondary | ICD-10-CM | POA: Diagnosis not present

## 2019-02-28 DIAGNOSIS — I10 Essential (primary) hypertension: Secondary | ICD-10-CM | POA: Diagnosis not present

## 2019-02-28 DIAGNOSIS — Z5181 Encounter for therapeutic drug level monitoring: Secondary | ICD-10-CM | POA: Diagnosis not present

## 2019-02-28 DIAGNOSIS — Z79899 Other long term (current) drug therapy: Secondary | ICD-10-CM | POA: Diagnosis not present

## 2019-02-28 DIAGNOSIS — Z940 Kidney transplant status: Secondary | ICD-10-CM | POA: Diagnosis not present

## 2019-02-28 DIAGNOSIS — N186 End stage renal disease: Secondary | ICD-10-CM | POA: Diagnosis not present

## 2019-03-01 DIAGNOSIS — I10 Essential (primary) hypertension: Secondary | ICD-10-CM | POA: Diagnosis not present

## 2019-03-01 DIAGNOSIS — D649 Anemia, unspecified: Secondary | ICD-10-CM | POA: Diagnosis not present

## 2019-03-01 DIAGNOSIS — Z79899 Other long term (current) drug therapy: Secondary | ICD-10-CM | POA: Diagnosis not present

## 2019-03-01 DIAGNOSIS — Z940 Kidney transplant status: Secondary | ICD-10-CM | POA: Diagnosis not present

## 2019-03-02 DIAGNOSIS — Z4822 Encounter for aftercare following kidney transplant: Secondary | ICD-10-CM | POA: Diagnosis not present

## 2019-03-02 DIAGNOSIS — A048 Other specified bacterial intestinal infections: Secondary | ICD-10-CM | POA: Insufficient documentation

## 2019-03-02 DIAGNOSIS — Z940 Kidney transplant status: Secondary | ICD-10-CM | POA: Diagnosis not present

## 2019-03-02 DIAGNOSIS — Z79899 Other long term (current) drug therapy: Secondary | ICD-10-CM | POA: Diagnosis not present

## 2019-03-02 DIAGNOSIS — Z5181 Encounter for therapeutic drug level monitoring: Secondary | ICD-10-CM | POA: Diagnosis not present

## 2019-03-04 DIAGNOSIS — T8619 Other complication of kidney transplant: Secondary | ICD-10-CM | POA: Diagnosis not present

## 2019-03-04 DIAGNOSIS — Z4822 Encounter for aftercare following kidney transplant: Secondary | ICD-10-CM | POA: Diagnosis not present

## 2019-03-04 DIAGNOSIS — I120 Hypertensive chronic kidney disease with stage 5 chronic kidney disease or end stage renal disease: Secondary | ICD-10-CM | POA: Diagnosis not present

## 2019-03-04 DIAGNOSIS — I10 Essential (primary) hypertension: Secondary | ICD-10-CM | POA: Diagnosis not present

## 2019-03-04 DIAGNOSIS — D649 Anemia, unspecified: Secondary | ICD-10-CM | POA: Diagnosis not present

## 2019-03-04 DIAGNOSIS — N186 End stage renal disease: Secondary | ICD-10-CM | POA: Diagnosis not present

## 2019-03-04 DIAGNOSIS — Z5181 Encounter for therapeutic drug level monitoring: Secondary | ICD-10-CM | POA: Diagnosis not present

## 2019-03-04 DIAGNOSIS — E785 Hyperlipidemia, unspecified: Secondary | ICD-10-CM | POA: Diagnosis not present

## 2019-03-04 DIAGNOSIS — Z79899 Other long term (current) drug therapy: Secondary | ICD-10-CM | POA: Diagnosis not present

## 2019-03-04 DIAGNOSIS — Z7952 Long term (current) use of systemic steroids: Secondary | ICD-10-CM | POA: Diagnosis not present

## 2019-03-04 DIAGNOSIS — D631 Anemia in chronic kidney disease: Secondary | ICD-10-CM | POA: Diagnosis not present

## 2019-03-04 DIAGNOSIS — E876 Hypokalemia: Secondary | ICD-10-CM | POA: Diagnosis not present

## 2019-03-04 DIAGNOSIS — R609 Edema, unspecified: Secondary | ICD-10-CM | POA: Diagnosis not present

## 2019-03-07 DIAGNOSIS — D649 Anemia, unspecified: Secondary | ICD-10-CM | POA: Diagnosis not present

## 2019-03-07 DIAGNOSIS — I10 Essential (primary) hypertension: Secondary | ICD-10-CM | POA: Diagnosis not present

## 2019-03-07 DIAGNOSIS — Z792 Long term (current) use of antibiotics: Secondary | ICD-10-CM | POA: Diagnosis not present

## 2019-03-07 DIAGNOSIS — E876 Hypokalemia: Secondary | ICD-10-CM | POA: Diagnosis not present

## 2019-03-07 DIAGNOSIS — Z79899 Other long term (current) drug therapy: Secondary | ICD-10-CM | POA: Diagnosis not present

## 2019-03-07 DIAGNOSIS — Z4822 Encounter for aftercare following kidney transplant: Secondary | ICD-10-CM | POA: Diagnosis not present

## 2019-03-07 DIAGNOSIS — Z5181 Encounter for therapeutic drug level monitoring: Secondary | ICD-10-CM | POA: Diagnosis not present

## 2019-03-07 DIAGNOSIS — D899 Disorder involving the immune mechanism, unspecified: Secondary | ICD-10-CM | POA: Diagnosis not present

## 2019-03-07 DIAGNOSIS — T8619 Other complication of kidney transplant: Secondary | ICD-10-CM | POA: Diagnosis not present

## 2019-03-07 DIAGNOSIS — Z7952 Long term (current) use of systemic steroids: Secondary | ICD-10-CM | POA: Diagnosis not present

## 2019-03-07 DIAGNOSIS — Z940 Kidney transplant status: Secondary | ICD-10-CM | POA: Diagnosis not present

## 2019-03-09 DIAGNOSIS — Z79899 Other long term (current) drug therapy: Secondary | ICD-10-CM | POA: Diagnosis not present

## 2019-03-09 DIAGNOSIS — M7989 Other specified soft tissue disorders: Secondary | ICD-10-CM | POA: Diagnosis not present

## 2019-03-09 DIAGNOSIS — Z940 Kidney transplant status: Secondary | ICD-10-CM | POA: Diagnosis not present

## 2019-03-09 DIAGNOSIS — N186 End stage renal disease: Secondary | ICD-10-CM | POA: Diagnosis not present

## 2019-03-09 DIAGNOSIS — Z4822 Encounter for aftercare following kidney transplant: Secondary | ICD-10-CM | POA: Diagnosis not present

## 2019-03-09 DIAGNOSIS — I120 Hypertensive chronic kidney disease with stage 5 chronic kidney disease or end stage renal disease: Secondary | ICD-10-CM | POA: Diagnosis not present

## 2019-03-09 DIAGNOSIS — D899 Disorder involving the immune mechanism, unspecified: Secondary | ICD-10-CM | POA: Diagnosis not present

## 2019-03-09 DIAGNOSIS — Z7952 Long term (current) use of systemic steroids: Secondary | ICD-10-CM | POA: Diagnosis not present

## 2019-03-09 DIAGNOSIS — Z792 Long term (current) use of antibiotics: Secondary | ICD-10-CM | POA: Diagnosis not present

## 2019-03-09 DIAGNOSIS — Z5181 Encounter for therapeutic drug level monitoring: Secondary | ICD-10-CM | POA: Diagnosis not present

## 2019-03-09 DIAGNOSIS — D631 Anemia in chronic kidney disease: Secondary | ICD-10-CM | POA: Diagnosis not present

## 2019-03-09 DIAGNOSIS — I10 Essential (primary) hypertension: Secondary | ICD-10-CM | POA: Diagnosis not present

## 2019-03-09 DIAGNOSIS — E872 Acidosis: Secondary | ICD-10-CM | POA: Diagnosis not present

## 2019-03-09 DIAGNOSIS — T8619 Other complication of kidney transplant: Secondary | ICD-10-CM | POA: Diagnosis not present

## 2019-03-09 DIAGNOSIS — Z992 Dependence on renal dialysis: Secondary | ICD-10-CM | POA: Diagnosis not present

## 2019-03-09 DIAGNOSIS — E785 Hyperlipidemia, unspecified: Secondary | ICD-10-CM | POA: Diagnosis not present

## 2019-03-10 DIAGNOSIS — Z4822 Encounter for aftercare following kidney transplant: Secondary | ICD-10-CM | POA: Diagnosis not present

## 2019-03-10 DIAGNOSIS — Z940 Kidney transplant status: Secondary | ICD-10-CM | POA: Diagnosis not present

## 2019-03-10 DIAGNOSIS — E872 Acidosis: Secondary | ICD-10-CM | POA: Diagnosis not present

## 2019-03-10 DIAGNOSIS — T8619 Other complication of kidney transplant: Secondary | ICD-10-CM | POA: Diagnosis not present

## 2019-03-10 DIAGNOSIS — I10 Essential (primary) hypertension: Secondary | ICD-10-CM | POA: Diagnosis not present

## 2019-03-10 DIAGNOSIS — B9681 Helicobacter pylori [H. pylori] as the cause of diseases classified elsewhere: Secondary | ICD-10-CM | POA: Diagnosis not present

## 2019-03-14 DIAGNOSIS — Z79899 Other long term (current) drug therapy: Secondary | ICD-10-CM | POA: Diagnosis not present

## 2019-03-14 DIAGNOSIS — Z5181 Encounter for therapeutic drug level monitoring: Secondary | ICD-10-CM | POA: Diagnosis not present

## 2019-03-14 DIAGNOSIS — E876 Hypokalemia: Secondary | ICD-10-CM | POA: Diagnosis not present

## 2019-03-14 DIAGNOSIS — D899 Disorder involving the immune mechanism, unspecified: Secondary | ICD-10-CM | POA: Diagnosis not present

## 2019-03-14 DIAGNOSIS — R82998 Other abnormal findings in urine: Secondary | ICD-10-CM | POA: Diagnosis not present

## 2019-03-14 DIAGNOSIS — D649 Anemia, unspecified: Secondary | ICD-10-CM | POA: Diagnosis not present

## 2019-03-14 DIAGNOSIS — I10 Essential (primary) hypertension: Secondary | ICD-10-CM | POA: Diagnosis not present

## 2019-03-14 DIAGNOSIS — Z4822 Encounter for aftercare following kidney transplant: Secondary | ICD-10-CM | POA: Diagnosis not present

## 2019-03-14 DIAGNOSIS — Z466 Encounter for fitting and adjustment of urinary device: Secondary | ICD-10-CM | POA: Diagnosis not present

## 2019-03-14 DIAGNOSIS — E785 Hyperlipidemia, unspecified: Secondary | ICD-10-CM | POA: Diagnosis not present

## 2019-03-14 DIAGNOSIS — E872 Acidosis: Secondary | ICD-10-CM | POA: Diagnosis not present

## 2019-03-14 DIAGNOSIS — Z940 Kidney transplant status: Secondary | ICD-10-CM | POA: Diagnosis not present

## 2019-03-17 DIAGNOSIS — I151 Hypertension secondary to other renal disorders: Secondary | ICD-10-CM | POA: Diagnosis not present

## 2019-03-17 DIAGNOSIS — E785 Hyperlipidemia, unspecified: Secondary | ICD-10-CM | POA: Diagnosis not present

## 2019-03-17 DIAGNOSIS — Z792 Long term (current) use of antibiotics: Secondary | ICD-10-CM | POA: Diagnosis not present

## 2019-03-17 DIAGNOSIS — I10 Essential (primary) hypertension: Secondary | ICD-10-CM | POA: Diagnosis not present

## 2019-03-17 DIAGNOSIS — N390 Urinary tract infection, site not specified: Secondary | ICD-10-CM | POA: Diagnosis not present

## 2019-03-17 DIAGNOSIS — Z7952 Long term (current) use of systemic steroids: Secondary | ICD-10-CM | POA: Diagnosis not present

## 2019-03-17 DIAGNOSIS — Z940 Kidney transplant status: Secondary | ICD-10-CM | POA: Diagnosis not present

## 2019-03-17 DIAGNOSIS — D899 Disorder involving the immune mechanism, unspecified: Secondary | ICD-10-CM | POA: Diagnosis not present

## 2019-03-17 DIAGNOSIS — Z79899 Other long term (current) drug therapy: Secondary | ICD-10-CM | POA: Diagnosis not present

## 2019-03-17 DIAGNOSIS — D649 Anemia, unspecified: Secondary | ICD-10-CM | POA: Diagnosis not present

## 2019-03-17 DIAGNOSIS — N2889 Other specified disorders of kidney and ureter: Secondary | ICD-10-CM | POA: Diagnosis not present

## 2019-03-17 DIAGNOSIS — Z4822 Encounter for aftercare following kidney transplant: Secondary | ICD-10-CM | POA: Diagnosis not present

## 2019-03-17 DIAGNOSIS — E876 Hypokalemia: Secondary | ICD-10-CM | POA: Diagnosis not present

## 2019-03-17 DIAGNOSIS — E872 Acidosis: Secondary | ICD-10-CM | POA: Diagnosis not present

## 2019-03-17 DIAGNOSIS — T8619 Other complication of kidney transplant: Secondary | ICD-10-CM | POA: Diagnosis not present

## 2019-03-21 DIAGNOSIS — I151 Hypertension secondary to other renal disorders: Secondary | ICD-10-CM | POA: Diagnosis not present

## 2019-03-21 DIAGNOSIS — Z87891 Personal history of nicotine dependence: Secondary | ICD-10-CM | POA: Diagnosis not present

## 2019-03-21 DIAGNOSIS — N390 Urinary tract infection, site not specified: Secondary | ICD-10-CM | POA: Diagnosis not present

## 2019-03-21 DIAGNOSIS — D899 Disorder involving the immune mechanism, unspecified: Secondary | ICD-10-CM | POA: Diagnosis not present

## 2019-03-21 DIAGNOSIS — Z4822 Encounter for aftercare following kidney transplant: Secondary | ICD-10-CM | POA: Diagnosis not present

## 2019-03-21 DIAGNOSIS — R600 Localized edema: Secondary | ICD-10-CM | POA: Diagnosis not present

## 2019-03-21 DIAGNOSIS — E872 Acidosis: Secondary | ICD-10-CM | POA: Diagnosis not present

## 2019-03-21 DIAGNOSIS — Z940 Kidney transplant status: Secondary | ICD-10-CM | POA: Diagnosis not present

## 2019-03-21 DIAGNOSIS — N12 Tubulo-interstitial nephritis, not specified as acute or chronic: Secondary | ICD-10-CM | POA: Diagnosis not present

## 2019-03-21 DIAGNOSIS — E785 Hyperlipidemia, unspecified: Secondary | ICD-10-CM | POA: Diagnosis not present

## 2019-03-21 DIAGNOSIS — D649 Anemia, unspecified: Secondary | ICD-10-CM | POA: Diagnosis not present

## 2019-03-21 DIAGNOSIS — Z79899 Other long term (current) drug therapy: Secondary | ICD-10-CM | POA: Diagnosis not present

## 2019-03-21 DIAGNOSIS — Z792 Long term (current) use of antibiotics: Secondary | ICD-10-CM | POA: Diagnosis not present

## 2019-03-21 DIAGNOSIS — Z7952 Long term (current) use of systemic steroids: Secondary | ICD-10-CM | POA: Diagnosis not present

## 2019-03-21 DIAGNOSIS — E876 Hypokalemia: Secondary | ICD-10-CM | POA: Diagnosis not present

## 2019-03-21 DIAGNOSIS — N2889 Other specified disorders of kidney and ureter: Secondary | ICD-10-CM | POA: Diagnosis not present

## 2019-03-29 DIAGNOSIS — D649 Anemia, unspecified: Secondary | ICD-10-CM | POA: Diagnosis not present

## 2019-03-29 DIAGNOSIS — E876 Hypokalemia: Secondary | ICD-10-CM | POA: Diagnosis not present

## 2019-03-29 DIAGNOSIS — N3000 Acute cystitis without hematuria: Secondary | ICD-10-CM | POA: Insufficient documentation

## 2019-03-29 DIAGNOSIS — Z940 Kidney transplant status: Secondary | ICD-10-CM | POA: Diagnosis not present

## 2019-03-29 DIAGNOSIS — N10 Acute tubulo-interstitial nephritis: Secondary | ICD-10-CM | POA: Diagnosis not present

## 2019-03-29 DIAGNOSIS — E785 Hyperlipidemia, unspecified: Secondary | ICD-10-CM | POA: Diagnosis not present

## 2019-03-29 DIAGNOSIS — E872 Acidosis: Secondary | ICD-10-CM | POA: Diagnosis not present

## 2019-03-29 DIAGNOSIS — N390 Urinary tract infection, site not specified: Secondary | ICD-10-CM | POA: Diagnosis not present

## 2019-03-29 DIAGNOSIS — B9689 Other specified bacterial agents as the cause of diseases classified elsewhere: Secondary | ICD-10-CM | POA: Diagnosis not present

## 2019-03-29 DIAGNOSIS — Z7952 Long term (current) use of systemic steroids: Secondary | ICD-10-CM | POA: Diagnosis not present

## 2019-03-29 DIAGNOSIS — Z79899 Other long term (current) drug therapy: Secondary | ICD-10-CM | POA: Insufficient documentation

## 2019-03-29 DIAGNOSIS — A048 Other specified bacterial intestinal infections: Secondary | ICD-10-CM | POA: Diagnosis not present

## 2019-03-29 DIAGNOSIS — I10 Essential (primary) hypertension: Secondary | ICD-10-CM | POA: Diagnosis not present

## 2019-03-29 DIAGNOSIS — Z792 Long term (current) use of antibiotics: Secondary | ICD-10-CM | POA: Diagnosis not present

## 2019-03-29 DIAGNOSIS — Z5181 Encounter for therapeutic drug level monitoring: Secondary | ICD-10-CM | POA: Diagnosis not present

## 2019-03-29 DIAGNOSIS — Z4822 Encounter for aftercare following kidney transplant: Secondary | ICD-10-CM | POA: Diagnosis not present

## 2019-04-05 DIAGNOSIS — Z7952 Long term (current) use of systemic steroids: Secondary | ICD-10-CM | POA: Diagnosis not present

## 2019-04-05 DIAGNOSIS — Z5181 Encounter for therapeutic drug level monitoring: Secondary | ICD-10-CM | POA: Diagnosis not present

## 2019-04-05 DIAGNOSIS — E872 Acidosis: Secondary | ICD-10-CM | POA: Diagnosis not present

## 2019-04-05 DIAGNOSIS — D649 Anemia, unspecified: Secondary | ICD-10-CM | POA: Diagnosis not present

## 2019-04-05 DIAGNOSIS — Z4822 Encounter for aftercare following kidney transplant: Secondary | ICD-10-CM | POA: Diagnosis not present

## 2019-04-05 DIAGNOSIS — Z79899 Other long term (current) drug therapy: Secondary | ICD-10-CM | POA: Diagnosis not present

## 2019-04-05 DIAGNOSIS — L7634 Postprocedural seroma of skin and subcutaneous tissue following other procedure: Secondary | ICD-10-CM | POA: Diagnosis not present

## 2019-04-05 DIAGNOSIS — Z792 Long term (current) use of antibiotics: Secondary | ICD-10-CM | POA: Diagnosis not present

## 2019-04-05 DIAGNOSIS — E876 Hypokalemia: Secondary | ICD-10-CM | POA: Diagnosis not present

## 2019-04-05 DIAGNOSIS — D899 Disorder involving the immune mechanism, unspecified: Secondary | ICD-10-CM | POA: Diagnosis not present

## 2019-04-05 DIAGNOSIS — I10 Essential (primary) hypertension: Secondary | ICD-10-CM | POA: Diagnosis not present

## 2019-04-05 DIAGNOSIS — Z940 Kidney transplant status: Secondary | ICD-10-CM | POA: Diagnosis not present

## 2019-04-05 DIAGNOSIS — E785 Hyperlipidemia, unspecified: Secondary | ICD-10-CM | POA: Diagnosis not present

## 2019-04-12 DIAGNOSIS — L7634 Postprocedural seroma of skin and subcutaneous tissue following other procedure: Secondary | ICD-10-CM | POA: Diagnosis not present

## 2019-04-12 DIAGNOSIS — D649 Anemia, unspecified: Secondary | ICD-10-CM | POA: Diagnosis not present

## 2019-04-12 DIAGNOSIS — E785 Hyperlipidemia, unspecified: Secondary | ICD-10-CM | POA: Diagnosis not present

## 2019-04-12 DIAGNOSIS — Z4822 Encounter for aftercare following kidney transplant: Secondary | ICD-10-CM | POA: Diagnosis not present

## 2019-04-12 DIAGNOSIS — Z940 Kidney transplant status: Secondary | ICD-10-CM | POA: Diagnosis not present

## 2019-04-12 DIAGNOSIS — Z7952 Long term (current) use of systemic steroids: Secondary | ICD-10-CM | POA: Diagnosis not present

## 2019-04-12 DIAGNOSIS — E872 Acidosis: Secondary | ICD-10-CM | POA: Diagnosis not present

## 2019-04-12 DIAGNOSIS — E876 Hypokalemia: Secondary | ICD-10-CM | POA: Diagnosis not present

## 2019-04-12 DIAGNOSIS — Z79899 Other long term (current) drug therapy: Secondary | ICD-10-CM | POA: Diagnosis not present

## 2019-04-12 DIAGNOSIS — I10 Essential (primary) hypertension: Secondary | ICD-10-CM | POA: Diagnosis not present

## 2019-04-12 DIAGNOSIS — Z792 Long term (current) use of antibiotics: Secondary | ICD-10-CM | POA: Diagnosis not present

## 2019-04-19 DIAGNOSIS — E869 Volume depletion, unspecified: Secondary | ICD-10-CM | POA: Diagnosis not present

## 2019-04-19 DIAGNOSIS — E876 Hypokalemia: Secondary | ICD-10-CM | POA: Diagnosis not present

## 2019-04-19 DIAGNOSIS — I10 Essential (primary) hypertension: Secondary | ICD-10-CM | POA: Diagnosis not present

## 2019-04-19 DIAGNOSIS — E872 Acidosis: Secondary | ICD-10-CM | POA: Diagnosis not present

## 2019-04-19 DIAGNOSIS — R112 Nausea with vomiting, unspecified: Secondary | ICD-10-CM | POA: Diagnosis not present

## 2019-04-19 DIAGNOSIS — Z7952 Long term (current) use of systemic steroids: Secondary | ICD-10-CM | POA: Diagnosis not present

## 2019-04-19 DIAGNOSIS — E785 Hyperlipidemia, unspecified: Secondary | ICD-10-CM | POA: Diagnosis not present

## 2019-04-19 DIAGNOSIS — Z79899 Other long term (current) drug therapy: Secondary | ICD-10-CM | POA: Diagnosis not present

## 2019-04-19 DIAGNOSIS — Z792 Long term (current) use of antibiotics: Secondary | ICD-10-CM | POA: Diagnosis not present

## 2019-04-19 DIAGNOSIS — D259 Leiomyoma of uterus, unspecified: Secondary | ICD-10-CM | POA: Diagnosis not present

## 2019-04-19 DIAGNOSIS — D649 Anemia, unspecified: Secondary | ICD-10-CM | POA: Diagnosis not present

## 2019-04-19 DIAGNOSIS — K5730 Diverticulosis of large intestine without perforation or abscess without bleeding: Secondary | ICD-10-CM | POA: Diagnosis not present

## 2019-04-19 DIAGNOSIS — Z4822 Encounter for aftercare following kidney transplant: Secondary | ICD-10-CM | POA: Diagnosis not present

## 2019-04-19 DIAGNOSIS — Z940 Kidney transplant status: Secondary | ICD-10-CM | POA: Diagnosis not present

## 2019-04-20 DIAGNOSIS — Z4822 Encounter for aftercare following kidney transplant: Secondary | ICD-10-CM | POA: Diagnosis not present

## 2019-04-20 DIAGNOSIS — E872 Acidosis: Secondary | ICD-10-CM | POA: Diagnosis not present

## 2019-04-20 DIAGNOSIS — R112 Nausea with vomiting, unspecified: Secondary | ICD-10-CM | POA: Diagnosis not present

## 2019-04-20 DIAGNOSIS — Z7952 Long term (current) use of systemic steroids: Secondary | ICD-10-CM | POA: Diagnosis not present

## 2019-04-20 DIAGNOSIS — Z79899 Other long term (current) drug therapy: Secondary | ICD-10-CM | POA: Diagnosis not present

## 2019-04-20 DIAGNOSIS — Z940 Kidney transplant status: Secondary | ICD-10-CM | POA: Diagnosis not present

## 2019-04-20 DIAGNOSIS — D259 Leiomyoma of uterus, unspecified: Secondary | ICD-10-CM | POA: Diagnosis not present

## 2019-04-20 DIAGNOSIS — K5730 Diverticulosis of large intestine without perforation or abscess without bleeding: Secondary | ICD-10-CM | POA: Diagnosis not present

## 2019-04-29 DIAGNOSIS — Z5181 Encounter for therapeutic drug level monitoring: Secondary | ICD-10-CM | POA: Diagnosis not present

## 2019-04-29 DIAGNOSIS — D649 Anemia, unspecified: Secondary | ICD-10-CM | POA: Diagnosis not present

## 2019-04-29 DIAGNOSIS — E785 Hyperlipidemia, unspecified: Secondary | ICD-10-CM | POA: Diagnosis not present

## 2019-04-29 DIAGNOSIS — I10 Essential (primary) hypertension: Secondary | ICD-10-CM | POA: Diagnosis not present

## 2019-04-29 DIAGNOSIS — E872 Acidosis: Secondary | ICD-10-CM | POA: Diagnosis not present

## 2019-04-29 DIAGNOSIS — Z79899 Other long term (current) drug therapy: Secondary | ICD-10-CM | POA: Diagnosis not present

## 2019-04-29 DIAGNOSIS — D8989 Other specified disorders involving the immune mechanism, not elsewhere classified: Secondary | ICD-10-CM | POA: Diagnosis not present

## 2019-04-29 DIAGNOSIS — Z792 Long term (current) use of antibiotics: Secondary | ICD-10-CM | POA: Diagnosis not present

## 2019-04-29 DIAGNOSIS — E876 Hypokalemia: Secondary | ICD-10-CM | POA: Diagnosis not present

## 2019-04-29 DIAGNOSIS — Z7952 Long term (current) use of systemic steroids: Secondary | ICD-10-CM | POA: Diagnosis not present

## 2019-04-29 DIAGNOSIS — Z940 Kidney transplant status: Secondary | ICD-10-CM | POA: Diagnosis not present

## 2019-04-29 DIAGNOSIS — N390 Urinary tract infection, site not specified: Secondary | ICD-10-CM | POA: Diagnosis not present

## 2019-04-29 DIAGNOSIS — R112 Nausea with vomiting, unspecified: Secondary | ICD-10-CM | POA: Diagnosis not present

## 2019-04-29 DIAGNOSIS — Z4822 Encounter for aftercare following kidney transplant: Secondary | ICD-10-CM | POA: Diagnosis not present

## 2019-05-03 DIAGNOSIS — E785 Hyperlipidemia, unspecified: Secondary | ICD-10-CM | POA: Diagnosis not present

## 2019-05-03 DIAGNOSIS — E212 Other hyperparathyroidism: Secondary | ICD-10-CM | POA: Diagnosis not present

## 2019-05-03 DIAGNOSIS — Z7952 Long term (current) use of systemic steroids: Secondary | ICD-10-CM | POA: Diagnosis not present

## 2019-05-03 DIAGNOSIS — Z5181 Encounter for therapeutic drug level monitoring: Secondary | ICD-10-CM | POA: Diagnosis not present

## 2019-05-03 DIAGNOSIS — I10 Essential (primary) hypertension: Secondary | ICD-10-CM | POA: Diagnosis not present

## 2019-05-03 DIAGNOSIS — D899 Disorder involving the immune mechanism, unspecified: Secondary | ICD-10-CM | POA: Diagnosis not present

## 2019-05-03 DIAGNOSIS — Z79899 Other long term (current) drug therapy: Secondary | ICD-10-CM | POA: Diagnosis not present

## 2019-05-03 DIAGNOSIS — D72819 Decreased white blood cell count, unspecified: Secondary | ICD-10-CM | POA: Diagnosis not present

## 2019-05-03 DIAGNOSIS — D702 Other drug-induced agranulocytosis: Secondary | ICD-10-CM | POA: Diagnosis not present

## 2019-05-03 DIAGNOSIS — D649 Anemia, unspecified: Secondary | ICD-10-CM | POA: Diagnosis not present

## 2019-05-03 DIAGNOSIS — R112 Nausea with vomiting, unspecified: Secondary | ICD-10-CM | POA: Diagnosis not present

## 2019-05-03 DIAGNOSIS — Z87891 Personal history of nicotine dependence: Secondary | ICD-10-CM | POA: Diagnosis not present

## 2019-05-03 DIAGNOSIS — Z87440 Personal history of urinary (tract) infections: Secondary | ICD-10-CM | POA: Diagnosis not present

## 2019-05-03 DIAGNOSIS — Z4822 Encounter for aftercare following kidney transplant: Secondary | ICD-10-CM | POA: Diagnosis not present

## 2019-05-03 DIAGNOSIS — E876 Hypokalemia: Secondary | ICD-10-CM | POA: Diagnosis not present

## 2019-05-03 DIAGNOSIS — Z940 Kidney transplant status: Secondary | ICD-10-CM | POA: Diagnosis not present

## 2019-05-03 DIAGNOSIS — E872 Acidosis: Secondary | ICD-10-CM | POA: Diagnosis not present

## 2019-05-10 DIAGNOSIS — T888XXA Other specified complications of surgical and medical care, not elsewhere classified, initial encounter: Secondary | ICD-10-CM | POA: Insufficient documentation

## 2019-05-10 DIAGNOSIS — T888XXD Other specified complications of surgical and medical care, not elsewhere classified, subsequent encounter: Secondary | ICD-10-CM | POA: Diagnosis not present

## 2019-05-10 DIAGNOSIS — N12 Tubulo-interstitial nephritis, not specified as acute or chronic: Secondary | ICD-10-CM | POA: Diagnosis not present

## 2019-05-10 DIAGNOSIS — Z940 Kidney transplant status: Secondary | ICD-10-CM | POA: Diagnosis not present

## 2019-05-10 DIAGNOSIS — B9689 Other specified bacterial agents as the cause of diseases classified elsewhere: Secondary | ICD-10-CM | POA: Diagnosis not present

## 2019-05-10 DIAGNOSIS — Z4822 Encounter for aftercare following kidney transplant: Secondary | ICD-10-CM | POA: Diagnosis not present

## 2019-05-10 DIAGNOSIS — B259 Cytomegaloviral disease, unspecified: Secondary | ICD-10-CM | POA: Diagnosis not present

## 2019-05-10 DIAGNOSIS — T8619 Other complication of kidney transplant: Secondary | ICD-10-CM | POA: Diagnosis not present

## 2019-05-10 DIAGNOSIS — I10 Essential (primary) hypertension: Secondary | ICD-10-CM | POA: Diagnosis not present

## 2019-05-10 DIAGNOSIS — D899 Disorder involving the immune mechanism, unspecified: Secondary | ICD-10-CM | POA: Diagnosis not present

## 2019-05-11 DIAGNOSIS — T888XXA Other specified complications of surgical and medical care, not elsewhere classified, initial encounter: Secondary | ICD-10-CM | POA: Diagnosis not present

## 2019-05-12 MED FILL — NORMAL SALINE FLUSH SYRINGE: 0.9 | 30 days supply | Qty: 300 | Fill #0

## 2019-05-17 DIAGNOSIS — Z79899 Other long term (current) drug therapy: Secondary | ICD-10-CM | POA: Diagnosis not present

## 2019-05-17 DIAGNOSIS — R112 Nausea with vomiting, unspecified: Secondary | ICD-10-CM | POA: Diagnosis not present

## 2019-05-17 DIAGNOSIS — D72819 Decreased white blood cell count, unspecified: Secondary | ICD-10-CM | POA: Diagnosis not present

## 2019-05-17 DIAGNOSIS — Z4822 Encounter for aftercare following kidney transplant: Secondary | ICD-10-CM | POA: Diagnosis not present

## 2019-05-17 DIAGNOSIS — Z7952 Long term (current) use of systemic steroids: Secondary | ICD-10-CM | POA: Diagnosis not present

## 2019-05-17 DIAGNOSIS — Z5181 Encounter for therapeutic drug level monitoring: Secondary | ICD-10-CM | POA: Diagnosis not present

## 2019-05-17 DIAGNOSIS — I10 Essential (primary) hypertension: Secondary | ICD-10-CM | POA: Diagnosis not present

## 2019-05-17 DIAGNOSIS — D649 Anemia, unspecified: Secondary | ICD-10-CM | POA: Diagnosis not present

## 2019-05-17 DIAGNOSIS — T888XXD Other specified complications of surgical and medical care, not elsewhere classified, subsequent encounter: Secondary | ICD-10-CM | POA: Diagnosis not present

## 2019-05-17 DIAGNOSIS — E785 Hyperlipidemia, unspecified: Secondary | ICD-10-CM | POA: Diagnosis not present

## 2019-05-17 DIAGNOSIS — D702 Other drug-induced agranulocytosis: Secondary | ICD-10-CM | POA: Diagnosis not present

## 2019-05-17 DIAGNOSIS — D899 Disorder involving the immune mechanism, unspecified: Secondary | ICD-10-CM | POA: Diagnosis not present

## 2019-05-17 DIAGNOSIS — Z940 Kidney transplant status: Secondary | ICD-10-CM | POA: Diagnosis not present

## 2019-05-25 DIAGNOSIS — E785 Hyperlipidemia, unspecified: Secondary | ICD-10-CM | POA: Diagnosis not present

## 2019-05-25 DIAGNOSIS — D72819 Decreased white blood cell count, unspecified: Secondary | ICD-10-CM | POA: Diagnosis not present

## 2019-05-25 DIAGNOSIS — N186 End stage renal disease: Secondary | ICD-10-CM | POA: Diagnosis not present

## 2019-05-25 DIAGNOSIS — A048 Other specified bacterial intestinal infections: Secondary | ICD-10-CM | POA: Diagnosis not present

## 2019-05-25 DIAGNOSIS — R112 Nausea with vomiting, unspecified: Secondary | ICD-10-CM | POA: Diagnosis not present

## 2019-05-25 DIAGNOSIS — T888XXD Other specified complications of surgical and medical care, not elsewhere classified, subsequent encounter: Secondary | ICD-10-CM | POA: Diagnosis not present

## 2019-05-25 DIAGNOSIS — Z4822 Encounter for aftercare following kidney transplant: Secondary | ICD-10-CM | POA: Diagnosis not present

## 2019-05-25 DIAGNOSIS — D702 Other drug-induced agranulocytosis: Secondary | ICD-10-CM | POA: Diagnosis not present

## 2019-05-25 DIAGNOSIS — D899 Disorder involving the immune mechanism, unspecified: Secondary | ICD-10-CM | POA: Diagnosis not present

## 2019-05-25 DIAGNOSIS — Z5181 Encounter for therapeutic drug level monitoring: Secondary | ICD-10-CM | POA: Diagnosis not present

## 2019-05-25 DIAGNOSIS — D649 Anemia, unspecified: Secondary | ICD-10-CM | POA: Diagnosis not present

## 2019-05-25 DIAGNOSIS — Z79899 Other long term (current) drug therapy: Secondary | ICD-10-CM | POA: Diagnosis not present

## 2019-05-25 DIAGNOSIS — I10 Essential (primary) hypertension: Secondary | ICD-10-CM | POA: Diagnosis not present

## 2019-05-25 DIAGNOSIS — Z940 Kidney transplant status: Secondary | ICD-10-CM | POA: Diagnosis not present

## 2019-05-25 DIAGNOSIS — I120 Hypertensive chronic kidney disease with stage 5 chronic kidney disease or end stage renal disease: Secondary | ICD-10-CM | POA: Diagnosis not present

## 2019-06-08 DIAGNOSIS — I151 Hypertension secondary to other renal disorders: Secondary | ICD-10-CM | POA: Diagnosis not present

## 2019-06-08 DIAGNOSIS — Z4822 Encounter for aftercare following kidney transplant: Secondary | ICD-10-CM | POA: Diagnosis not present

## 2019-06-08 DIAGNOSIS — E876 Hypokalemia: Secondary | ICD-10-CM | POA: Diagnosis not present

## 2019-06-08 DIAGNOSIS — Z79899 Other long term (current) drug therapy: Secondary | ICD-10-CM | POA: Diagnosis not present

## 2019-06-08 DIAGNOSIS — N2889 Other specified disorders of kidney and ureter: Secondary | ICD-10-CM | POA: Diagnosis not present

## 2019-06-08 DIAGNOSIS — I10 Essential (primary) hypertension: Secondary | ICD-10-CM | POA: Diagnosis not present

## 2019-06-08 DIAGNOSIS — D72819 Decreased white blood cell count, unspecified: Secondary | ICD-10-CM | POA: Diagnosis not present

## 2019-06-08 DIAGNOSIS — D702 Other drug-induced agranulocytosis: Secondary | ICD-10-CM | POA: Diagnosis not present

## 2019-06-08 DIAGNOSIS — D849 Immunodeficiency, unspecified: Secondary | ICD-10-CM | POA: Diagnosis not present

## 2019-06-08 DIAGNOSIS — Z7952 Long term (current) use of systemic steroids: Secondary | ICD-10-CM | POA: Diagnosis not present

## 2019-06-08 DIAGNOSIS — E212 Other hyperparathyroidism: Secondary | ICD-10-CM | POA: Diagnosis not present

## 2019-06-08 DIAGNOSIS — Z940 Kidney transplant status: Secondary | ICD-10-CM | POA: Diagnosis not present

## 2019-06-08 DIAGNOSIS — N186 End stage renal disease: Secondary | ICD-10-CM | POA: Diagnosis not present

## 2019-06-08 DIAGNOSIS — D649 Anemia, unspecified: Secondary | ICD-10-CM | POA: Diagnosis not present

## 2019-06-22 DIAGNOSIS — Z4822 Encounter for aftercare following kidney transplant: Secondary | ICD-10-CM | POA: Diagnosis not present

## 2019-07-06 DIAGNOSIS — D849 Immunodeficiency, unspecified: Secondary | ICD-10-CM | POA: Diagnosis not present

## 2019-07-06 DIAGNOSIS — E876 Hypokalemia: Secondary | ICD-10-CM | POA: Diagnosis not present

## 2019-07-06 DIAGNOSIS — I10 Essential (primary) hypertension: Secondary | ICD-10-CM | POA: Diagnosis not present

## 2019-07-06 DIAGNOSIS — Z940 Kidney transplant status: Secondary | ICD-10-CM | POA: Diagnosis not present

## 2019-07-06 DIAGNOSIS — D72818 Other decreased white blood cell count: Secondary | ICD-10-CM | POA: Diagnosis not present

## 2019-07-06 DIAGNOSIS — E7849 Other hyperlipidemia: Secondary | ICD-10-CM | POA: Diagnosis not present

## 2019-07-06 DIAGNOSIS — Z7952 Long term (current) use of systemic steroids: Secondary | ICD-10-CM | POA: Diagnosis not present

## 2019-07-06 DIAGNOSIS — D6489 Other specified anemias: Secondary | ICD-10-CM | POA: Diagnosis not present

## 2019-07-06 DIAGNOSIS — A048 Other specified bacterial intestinal infections: Secondary | ICD-10-CM | POA: Diagnosis not present

## 2019-07-06 DIAGNOSIS — Z79899 Other long term (current) drug therapy: Secondary | ICD-10-CM | POA: Diagnosis not present

## 2019-07-06 DIAGNOSIS — R110 Nausea: Secondary | ICD-10-CM | POA: Diagnosis not present

## 2019-07-06 DIAGNOSIS — Z792 Long term (current) use of antibiotics: Secondary | ICD-10-CM | POA: Diagnosis not present

## 2019-07-06 DIAGNOSIS — Z4822 Encounter for aftercare following kidney transplant: Secondary | ICD-10-CM | POA: Diagnosis not present

## 2019-07-06 DIAGNOSIS — Z5181 Encounter for therapeutic drug level monitoring: Secondary | ICD-10-CM | POA: Diagnosis not present

## 2019-07-06 DIAGNOSIS — Z23 Encounter for immunization: Secondary | ICD-10-CM | POA: Diagnosis not present

## 2019-07-06 DIAGNOSIS — E872 Acidosis: Secondary | ICD-10-CM | POA: Diagnosis not present

## 2019-07-06 DIAGNOSIS — E212 Other hyperparathyroidism: Secondary | ICD-10-CM | POA: Diagnosis not present

## 2019-07-07 ENCOUNTER — Other Ambulatory Visit: Payer: Self-pay

## 2019-07-11 ENCOUNTER — Emergency Department (HOSPITAL_COMMUNITY)
Admission: EM | Admit: 2019-07-11 | Discharge: 2019-07-11 | Disposition: A | Discharge status: Home / Self Care | Payer: Medicare Other | Attending: Emergency Medicine | Admitting: Emergency Medicine

## 2019-07-11 ENCOUNTER — Encounter (HOSPITAL_COMMUNITY): Payer: Self-pay

## 2019-07-11 ENCOUNTER — Other Ambulatory Visit: Payer: Self-pay

## 2019-07-11 DIAGNOSIS — Z87891 Personal history of nicotine dependence: Secondary | ICD-10-CM | POA: Insufficient documentation

## 2019-07-11 DIAGNOSIS — Y939 Activity, unspecified: Secondary | ICD-10-CM | POA: Insufficient documentation

## 2019-07-11 DIAGNOSIS — N186 End stage renal disease: Secondary | ICD-10-CM | POA: Insufficient documentation

## 2019-07-11 DIAGNOSIS — M25511 Pain in right shoulder: Secondary | ICD-10-CM | POA: Insufficient documentation

## 2019-07-11 DIAGNOSIS — F1210 Cannabis abuse, uncomplicated: Secondary | ICD-10-CM | POA: Diagnosis not present

## 2019-07-11 DIAGNOSIS — M25512 Pain in left shoulder: Secondary | ICD-10-CM | POA: Insufficient documentation

## 2019-07-11 DIAGNOSIS — M549 Dorsalgia, unspecified: Secondary | ICD-10-CM | POA: Insufficient documentation

## 2019-07-11 DIAGNOSIS — Z992 Dependence on renal dialysis: Secondary | ICD-10-CM | POA: Insufficient documentation

## 2019-07-11 DIAGNOSIS — Z79899 Other long term (current) drug therapy: Secondary | ICD-10-CM | POA: Insufficient documentation

## 2019-07-11 DIAGNOSIS — M546 Pain in thoracic spine: Secondary | ICD-10-CM | POA: Diagnosis not present

## 2019-07-11 DIAGNOSIS — M545 Low back pain: Secondary | ICD-10-CM | POA: Diagnosis not present

## 2019-07-11 DIAGNOSIS — Y92410 Unspecified street and highway as the place of occurrence of the external cause: Secondary | ICD-10-CM | POA: Insufficient documentation

## 2019-07-11 DIAGNOSIS — Y999 Unspecified external cause status: Secondary | ICD-10-CM | POA: Insufficient documentation

## 2019-07-11 DIAGNOSIS — I120 Hypertensive chronic kidney disease with stage 5 chronic kidney disease or end stage renal disease: Secondary | ICD-10-CM | POA: Diagnosis not present

## 2019-07-11 MED ORDER — CYCLOBENZAPRINE HCL 10 MG PO TABS: 10.0000 mg | ORAL_TABLET | Freq: Two times a day (BID) | ORAL | 0 refills | Status: DC | PRN
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
## 2019-07-24 DIAGNOSIS — Z20828 Contact with and (suspected) exposure to other viral communicable diseases: Secondary | ICD-10-CM | POA: Diagnosis not present

## 2019-07-24 DIAGNOSIS — E860 Dehydration: Secondary | ICD-10-CM | POA: Diagnosis not present

## 2019-07-24 DIAGNOSIS — R112 Nausea with vomiting, unspecified: Secondary | ICD-10-CM | POA: Diagnosis not present

## 2019-07-24 DIAGNOSIS — F17200 Nicotine dependence, unspecified, uncomplicated: Secondary | ICD-10-CM | POA: Diagnosis not present

## 2019-07-24 DIAGNOSIS — R1084 Generalized abdominal pain: Secondary | ICD-10-CM | POA: Diagnosis not present

## 2019-07-24 DIAGNOSIS — Z3202 Encounter for pregnancy test, result negative: Secondary | ICD-10-CM | POA: Diagnosis not present

## 2019-07-24 DIAGNOSIS — Z940 Kidney transplant status: Secondary | ICD-10-CM | POA: Diagnosis not present

## 2019-07-25 DIAGNOSIS — Z3202 Encounter for pregnancy test, result negative: Secondary | ICD-10-CM | POA: Diagnosis not present

## 2019-07-25 DIAGNOSIS — R197 Diarrhea, unspecified: Secondary | ICD-10-CM | POA: Diagnosis not present

## 2019-07-25 DIAGNOSIS — Z940 Kidney transplant status: Secondary | ICD-10-CM | POA: Diagnosis not present

## 2019-07-25 DIAGNOSIS — Z20828 Contact with and (suspected) exposure to other viral communicable diseases: Secondary | ICD-10-CM | POA: Diagnosis present

## 2019-07-25 DIAGNOSIS — R1084 Generalized abdominal pain: Secondary | ICD-10-CM | POA: Diagnosis not present

## 2019-07-25 DIAGNOSIS — R112 Nausea with vomiting, unspecified: Secondary | ICD-10-CM | POA: Diagnosis not present

## 2019-07-25 DIAGNOSIS — E872 Acidosis: Secondary | ICD-10-CM | POA: Diagnosis not present

## 2019-07-25 DIAGNOSIS — F17200 Nicotine dependence, unspecified, uncomplicated: Secondary | ICD-10-CM | POA: Diagnosis present

## 2019-07-25 DIAGNOSIS — E860 Dehydration: Secondary | ICD-10-CM | POA: Diagnosis not present

## 2019-08-03 DIAGNOSIS — D649 Anemia, unspecified: Secondary | ICD-10-CM | POA: Diagnosis not present

## 2019-08-03 DIAGNOSIS — Z7952 Long term (current) use of systemic steroids: Secondary | ICD-10-CM | POA: Diagnosis not present

## 2019-08-03 DIAGNOSIS — E212 Other hyperparathyroidism: Secondary | ICD-10-CM | POA: Insufficient documentation

## 2019-08-03 DIAGNOSIS — B259 Cytomegaloviral disease, unspecified: Secondary | ICD-10-CM | POA: Insufficient documentation

## 2019-08-03 DIAGNOSIS — Z940 Kidney transplant status: Secondary | ICD-10-CM | POA: Diagnosis not present

## 2019-08-03 DIAGNOSIS — Z5181 Encounter for therapeutic drug level monitoring: Secondary | ICD-10-CM | POA: Diagnosis not present

## 2019-08-03 DIAGNOSIS — E872 Acidosis: Secondary | ICD-10-CM | POA: Diagnosis not present

## 2019-08-03 DIAGNOSIS — D849 Immunodeficiency, unspecified: Secondary | ICD-10-CM | POA: Diagnosis not present

## 2019-08-03 DIAGNOSIS — E785 Hyperlipidemia, unspecified: Secondary | ICD-10-CM | POA: Diagnosis not present

## 2019-08-03 DIAGNOSIS — Z4822 Encounter for aftercare following kidney transplant: Secondary | ICD-10-CM | POA: Diagnosis not present

## 2019-08-03 DIAGNOSIS — B349 Viral infection, unspecified: Secondary | ICD-10-CM | POA: Diagnosis not present

## 2019-08-03 DIAGNOSIS — D72819 Decreased white blood cell count, unspecified: Secondary | ICD-10-CM | POA: Diagnosis not present

## 2019-08-03 DIAGNOSIS — Z8719 Personal history of other diseases of the digestive system: Secondary | ICD-10-CM | POA: Diagnosis not present

## 2019-08-03 DIAGNOSIS — I10 Essential (primary) hypertension: Secondary | ICD-10-CM | POA: Diagnosis not present

## 2019-08-03 DIAGNOSIS — Z87440 Personal history of urinary (tract) infections: Secondary | ICD-10-CM | POA: Diagnosis not present

## 2019-08-03 DIAGNOSIS — Z79899 Other long term (current) drug therapy: Secondary | ICD-10-CM | POA: Diagnosis not present

## 2019-08-29 DIAGNOSIS — Z20822 Contact with and (suspected) exposure to covid-19: Secondary | ICD-10-CM | POA: Diagnosis not present

## 2019-08-29 DIAGNOSIS — Z20828 Contact with and (suspected) exposure to other viral communicable diseases: Secondary | ICD-10-CM | POA: Diagnosis not present

## 2019-09-02 DIAGNOSIS — Z8619 Personal history of other infectious and parasitic diseases: Secondary | ICD-10-CM | POA: Diagnosis not present

## 2019-09-02 DIAGNOSIS — K2950 Unspecified chronic gastritis without bleeding: Secondary | ICD-10-CM | POA: Diagnosis not present

## 2019-09-02 DIAGNOSIS — K200 Eosinophilic esophagitis: Secondary | ICD-10-CM | POA: Diagnosis not present

## 2019-09-02 DIAGNOSIS — I10 Essential (primary) hypertension: Secondary | ICD-10-CM | POA: Diagnosis not present

## 2019-09-02 DIAGNOSIS — B3781 Candidal esophagitis: Secondary | ICD-10-CM | POA: Diagnosis not present

## 2019-09-02 DIAGNOSIS — A048 Other specified bacterial intestinal infections: Secondary | ICD-10-CM | POA: Diagnosis not present

## 2019-09-02 DIAGNOSIS — R112 Nausea with vomiting, unspecified: Secondary | ICD-10-CM | POA: Diagnosis not present

## 2019-09-02 DIAGNOSIS — R110 Nausea: Secondary | ICD-10-CM | POA: Diagnosis not present

## 2019-09-06 DIAGNOSIS — E212 Other hyperparathyroidism: Secondary | ICD-10-CM | POA: Diagnosis not present

## 2019-09-06 DIAGNOSIS — E785 Hyperlipidemia, unspecified: Secondary | ICD-10-CM | POA: Diagnosis not present

## 2019-09-06 DIAGNOSIS — Z4822 Encounter for aftercare following kidney transplant: Secondary | ICD-10-CM | POA: Diagnosis not present

## 2019-09-06 DIAGNOSIS — Z5181 Encounter for therapeutic drug level monitoring: Secondary | ICD-10-CM | POA: Diagnosis not present

## 2019-09-06 DIAGNOSIS — Z792 Long term (current) use of antibiotics: Secondary | ICD-10-CM | POA: Diagnosis not present

## 2019-09-06 DIAGNOSIS — D849 Immunodeficiency, unspecified: Secondary | ICD-10-CM | POA: Diagnosis not present

## 2019-09-06 DIAGNOSIS — Z79899 Other long term (current) drug therapy: Secondary | ICD-10-CM | POA: Diagnosis not present

## 2019-09-06 DIAGNOSIS — I10 Essential (primary) hypertension: Secondary | ICD-10-CM | POA: Diagnosis not present

## 2019-09-06 DIAGNOSIS — D649 Anemia, unspecified: Secondary | ICD-10-CM | POA: Diagnosis not present

## 2019-09-06 DIAGNOSIS — Z940 Kidney transplant status: Secondary | ICD-10-CM | POA: Diagnosis not present

## 2019-09-06 DIAGNOSIS — Z7952 Long term (current) use of systemic steroids: Secondary | ICD-10-CM | POA: Diagnosis not present

## 2019-09-06 DIAGNOSIS — B259 Cytomegaloviral disease, unspecified: Secondary | ICD-10-CM | POA: Diagnosis not present

## 2019-09-15 ENCOUNTER — Other Ambulatory Visit: Payer: Self-pay

## 2019-09-15 ENCOUNTER — Encounter (HOSPITAL_COMMUNITY): Payer: Self-pay

## 2019-09-15 ENCOUNTER — Ambulatory Visit (HOSPITAL_COMMUNITY)
Admission: EM | Admit: 2019-09-15 | Discharge: 2019-09-15 | Disposition: A | Discharge status: Home / Self Care | Payer: Medicare Other | Attending: Urgent Care | Admitting: Urgent Care

## 2019-09-15 DIAGNOSIS — Z940 Kidney transplant status: Secondary | ICD-10-CM

## 2019-09-15 DIAGNOSIS — I10 Essential (primary) hypertension: Secondary | ICD-10-CM

## 2019-09-15 DIAGNOSIS — R030 Elevated blood-pressure reading, without diagnosis of hypertension: Secondary | ICD-10-CM

## 2019-09-15 DIAGNOSIS — H6690 Otitis media, unspecified, unspecified ear: Secondary | ICD-10-CM | POA: Diagnosis not present

## 2019-09-15 DIAGNOSIS — H9202 Otalgia, left ear: Secondary | ICD-10-CM

## 2019-09-15 DIAGNOSIS — H938X2 Other specified disorders of left ear: Secondary | ICD-10-CM

## 2019-09-15 MED ORDER — AMOXICILLIN 875 MG PO TABS: 875.0000 mg | ORAL_TABLET | Freq: Two times a day (BID) | ORAL | 0 refills | Status: DC

## 2019-09-15 MED ORDER — CETIRIZINE HCL 10 MG PO TABS: 10.0000 mg | ORAL_TABLET | Freq: Every day | ORAL | 0 refills | Status: DC

## 2019-09-15 MED ORDER — FLUTICASONE PROPIONATE 50 MCG/ACT NA SUSP: 2.0000 | Freq: Every day | NASAL | 0 refills | Status: DC

## 2019-09-15 NOTE — ED Triage Notes (Signed)
Patient presents to Urgent Care with complaints of loss of hearing and some throbbing in her left ear since three days ago. Patient reports her hearing sounds muffled.

## 2019-09-15 NOTE — ED Provider Notes (Signed)
[place]   MRN: [id number] DOB: 1976/05/08  Subjective:   Melody [name] is a 44 y.o. female presenting for 3-day history of acute onset of left ear pain/throbbing, muffled hearing.  States that she is also had runny stuffy nose and watery eyes.  She did get tested for COVID-19 within the past 2 weeks and was negative.  She does not care to be tested for this again.  She is a kidney transplant patient, has had to be on dialysis previously but not anymore for the past several months.  Kidney function has look great.  No current facility-administered medications for this encounter.  Current Outpatient Medications:  .  acetaminophen (TYLENOL) 500 MG tablet, Take 1,000 mg by mouth every 6 (six) hours as needed for mild pain or headache., Disp: , Rfl:  .  amLODipine (NORVASC) 10 MG tablet, Take 1 tablet (10 mg total) by mouth at bedtime., Disp: 30 tablet, Rfl: 6 .  calcium acetate (PHOSLO) 667 MG capsule, Take 2 capsules (1,334 mg total) by mouth 3 (three) times daily with meals., Disp: 180 capsule, Rfl: 0 .  cyclobenzaprine (FLEXERIL) 10 MG tablet, Take 1 tablet (10 mg total) by mouth 2 (two) times daily as needed for muscle spasms., Disp: 14 tablet, Rfl: 0 .  labetalol (NORMODYNE) 300 MG tablet, Take 1 tablet (300 mg total) by mouth 2 (two) times daily., Disp: 180 tablet, Rfl: 3 .  lidocaine-prilocaine (EMLA) cream, Apply 1 application topically daily as needed for irritation., Disp: , Rfl: 0 .  lisinopril (PRINIVIL,ZESTRIL) 30 MG tablet, Take 1 tablet (30 mg total) by mouth at bedtime., Disp: 90 tablet, Rfl: 3 .  multivitamin (RENA-VIT) TABS tablet, Take 1 tablet by mouth daily., Disp: , Rfl: 0   Allergies  Allergen Reactions  . Codeine Other (See Comments)    Tylenol #3 causes nightmares  . Tramadol Hives    Past Medical History:  Diagnosis Date  . Alcohol abuse   . Anemia   . Chronic kidney disease    staqge 5 M/W/F Dialysis  . Drug abuse ([place])   . Ectopic  pregnancy   . Headache    Migraines  . Hypertension Dx May 2016  . Tobacco abuse      Past Surgical History:  Procedure Laterality Date  . AV FISTULA PLACEMENT Right 09/15/2016   Procedure: ARTERIOVENOUS (AV) FISTULA CREATION UPPER RIGHT ARM;  Surgeon: Rosetta Posner, MD;  Location: [place];  Service: Vascular;  Laterality: Right;  . AV FISTULA PLACEMENT Left 12/30/2016   Procedure: ARTERIOVENOUS (AV) FISTULA CREATION USING GORETEX STRETCH GRAFT;  Surgeon: Elam Dutch, MD;  Location: [facility] OR;  Service: Vascular;  Laterality: Left;  . AV FISTULA PLACEMENT Left 06/15/2017   Procedure: INSERTION OF ARTERIOVENOUS (AV) GORE-TEX GRAFT 6MM X 40CM LEFT ARM;  Surgeon: Rosetta Posner, MD;  Location: [place];  Service: Vascular;  Laterality: Left;  . AV FISTULA PLACEMENT Right 08/05/2018   Procedure: Insertion of Right arm gortex graft;  Surgeon: Rosetta Posner, MD;  Location: [place];  Service: Vascular;  Laterality: Right;  . DILATION AND CURETTAGE OF UTERUS    . INSERTION OF DIALYSIS CATHETER Right 09/15/2016   Procedure: INSERTION OF DIALYSIS CATHETER ON RIGHT SIDE;  Surgeon: Rosetta Posner, MD;  Location: [facility] OR;  Service: Vascular;  Laterality: Right;    Family History  Problem Relation Age of Onset  . CAD Mother   . Hypertension Mother   . Heart disease Mother   .  Kidney failure Mother   . Cirrhosis Father   . CAD Brother   . Hypertension Other   . Kidney failure Other   . Hypertension Maternal Grandmother   . Kidney failure Maternal Grandmother   . Congestive Heart Failure Maternal Grandmother   . Breast cancer Sister   . Thyroid disease Neg Hx     Social History   Tobacco Use  . Smoking status: Former Smoker    Packs/day: 0.00    Years: 0.00    Pack years: 0.00    Types: Cigars, Cigarettes    Start date: 09/11/2016  . Smokeless tobacco: Never Used  Substance Use Topics  . Alcohol use: Yes    Alcohol/week: 0.0 standard drinks    Comment: occasionally  . Drug use: Yes    Types:  Marijuana    ROS   Objective:   Vitals: BP (!) 145/92 (BP Location: Left Arm)   Pulse 87   Temp 98.6 F (37 C) (Oral)   Resp 16   SpO2 100%   Physical Exam Constitutional:      General: She is not in acute distress.    Appearance: Normal appearance. She is well-developed. She is not ill-appearing, toxic-appearing or diaphoretic.  HENT:     Head: Normocephalic and atraumatic.     Right Ear: Tympanic membrane and ear canal normal. No drainage or tenderness. No middle ear effusion. Tympanic membrane is not erythematous.     Left Ear: Ear canal normal. Swelling present. No drainage or tenderness.  No middle ear effusion. Tympanic membrane is injected and erythematous.     Nose: Congestion and rhinorrhea present.     Mouth/Throat:     Mouth: Mucous membranes are moist. No oral lesions.     Pharynx: No pharyngeal swelling, oropharyngeal exudate, posterior oropharyngeal erythema or uvula swelling.     Tonsils: No tonsillar exudate or tonsillar abscesses.  Eyes:     General: No scleral icterus.    Extraocular Movements: Extraocular movements intact.     Right eye: Normal extraocular motion.     Left eye: Normal extraocular motion.     Conjunctiva/sclera: Conjunctivae normal.     Pupils: Pupils are equal, round, and reactive to light.  Cardiovascular:     Rate and Rhythm: Normal rate.  Pulmonary:     Effort: Pulmonary effort is normal.  Musculoskeletal:     Cervical back: Normal range of motion and neck supple.  Lymphadenopathy:     Cervical: No cervical adenopathy.  Skin:    General: Skin is warm and dry.  Neurological:     General: No focal deficit present.     Mental Status: She is alert and oriented to person, place, and time.  Psychiatric:        Mood and Affect: Mood normal.        Behavior: Behavior normal.        Thought Content: Thought content normal.        Judgment: Judgment normal.      Assessment and Plan :   1. Acute otitis media, unspecified otitis  media type   2. Left ear pain   3. Ear fullness, left   4. Kidney transplant recipient   5. Essential hypertension   6. Elevated blood pressure reading     Start amoxicillin to address otitis media which I suspect is secondary to allergies.  Will have her start Flonase and Zyrtec for this. Renal function is 59 GFR as of 09/06/2019. Counseled patient on potential  for adverse effects with medications prescribed/recommended today, ER and return-to-clinic precautions discussed, patient verbalized understanding.    Jaynee Eagles, [place] 09/15/19 1816

## 2019-09-21 DIAGNOSIS — E212 Other hyperparathyroidism: Secondary | ICD-10-CM | POA: Diagnosis not present

## 2019-09-21 DIAGNOSIS — D84821 Immunodeficiency due to drugs: Secondary | ICD-10-CM | POA: Diagnosis not present

## 2019-09-21 DIAGNOSIS — B259 Cytomegaloviral disease, unspecified: Secondary | ICD-10-CM | POA: Diagnosis not present

## 2019-09-21 DIAGNOSIS — Z940 Kidney transplant status: Secondary | ICD-10-CM | POA: Diagnosis not present

## 2019-09-21 DIAGNOSIS — R112 Nausea with vomiting, unspecified: Secondary | ICD-10-CM | POA: Diagnosis not present

## 2019-09-21 DIAGNOSIS — E785 Hyperlipidemia, unspecified: Secondary | ICD-10-CM | POA: Diagnosis not present

## 2019-09-21 DIAGNOSIS — Z4822 Encounter for aftercare following kidney transplant: Secondary | ICD-10-CM | POA: Diagnosis not present

## 2019-09-21 DIAGNOSIS — Z79899 Other long term (current) drug therapy: Secondary | ICD-10-CM | POA: Diagnosis not present

## 2019-09-21 DIAGNOSIS — I10 Essential (primary) hypertension: Secondary | ICD-10-CM | POA: Diagnosis not present

## 2019-09-21 DIAGNOSIS — B349 Viral infection, unspecified: Secondary | ICD-10-CM | POA: Diagnosis not present

## 2019-09-21 DIAGNOSIS — E213 Hyperparathyroidism, unspecified: Secondary | ICD-10-CM | POA: Diagnosis not present

## 2019-10-19 DIAGNOSIS — R809 Proteinuria, unspecified: Secondary | ICD-10-CM | POA: Diagnosis not present

## 2019-10-19 DIAGNOSIS — Z79899 Other long term (current) drug therapy: Secondary | ICD-10-CM | POA: Diagnosis not present

## 2019-10-19 DIAGNOSIS — I10 Essential (primary) hypertension: Secondary | ICD-10-CM | POA: Diagnosis not present

## 2019-10-19 DIAGNOSIS — Z87448 Personal history of other diseases of urinary system: Secondary | ICD-10-CM | POA: Diagnosis not present

## 2019-10-19 DIAGNOSIS — E785 Hyperlipidemia, unspecified: Secondary | ICD-10-CM | POA: Diagnosis not present

## 2019-10-19 DIAGNOSIS — Z4822 Encounter for aftercare following kidney transplant: Secondary | ICD-10-CM | POA: Diagnosis not present

## 2019-10-19 DIAGNOSIS — R112 Nausea with vomiting, unspecified: Secondary | ICD-10-CM | POA: Diagnosis not present

## 2019-10-19 DIAGNOSIS — Z8619 Personal history of other infectious and parasitic diseases: Secondary | ICD-10-CM | POA: Diagnosis not present

## 2019-10-19 DIAGNOSIS — Z7952 Long term (current) use of systemic steroids: Secondary | ICD-10-CM | POA: Diagnosis not present

## 2019-10-19 DIAGNOSIS — E212 Other hyperparathyroidism: Secondary | ICD-10-CM | POA: Diagnosis not present

## 2019-10-19 DIAGNOSIS — Z940 Kidney transplant status: Secondary | ICD-10-CM | POA: Diagnosis not present

## 2019-10-19 DIAGNOSIS — D649 Anemia, unspecified: Secondary | ICD-10-CM | POA: Diagnosis not present

## 2019-10-19 DIAGNOSIS — Z862 Personal history of diseases of the blood and blood-forming organs and certain disorders involving the immune mechanism: Secondary | ICD-10-CM | POA: Diagnosis not present

## 2019-10-19 DIAGNOSIS — D849 Immunodeficiency, unspecified: Secondary | ICD-10-CM | POA: Diagnosis not present

## 2019-10-19 DIAGNOSIS — E872 Acidosis: Secondary | ICD-10-CM | POA: Diagnosis not present

## 2019-11-03 DIAGNOSIS — Z940 Kidney transplant status: Secondary | ICD-10-CM | POA: Diagnosis not present

## 2019-11-03 DIAGNOSIS — Z4822 Encounter for aftercare following kidney transplant: Secondary | ICD-10-CM | POA: Diagnosis not present

## 2019-11-16 DIAGNOSIS — Z7952 Long term (current) use of systemic steroids: Secondary | ICD-10-CM | POA: Diagnosis not present

## 2019-11-16 DIAGNOSIS — E212 Other hyperparathyroidism: Secondary | ICD-10-CM | POA: Diagnosis not present

## 2019-11-16 DIAGNOSIS — Z4822 Encounter for aftercare following kidney transplant: Secondary | ICD-10-CM | POA: Diagnosis not present

## 2019-11-16 DIAGNOSIS — Z940 Kidney transplant status: Secondary | ICD-10-CM | POA: Diagnosis not present

## 2019-11-16 DIAGNOSIS — K219 Gastro-esophageal reflux disease without esophagitis: Secondary | ICD-10-CM | POA: Diagnosis not present

## 2019-11-16 DIAGNOSIS — Z79899 Other long term (current) drug therapy: Secondary | ICD-10-CM | POA: Diagnosis not present

## 2019-11-16 DIAGNOSIS — I10 Essential (primary) hypertension: Secondary | ICD-10-CM | POA: Diagnosis not present

## 2019-12-12 DIAGNOSIS — Z23 Encounter for immunization: Secondary | ICD-10-CM | POA: Diagnosis not present

## 2019-12-14 DIAGNOSIS — E785 Hyperlipidemia, unspecified: Secondary | ICD-10-CM | POA: Diagnosis not present

## 2019-12-14 DIAGNOSIS — Z5181 Encounter for therapeutic drug level monitoring: Secondary | ICD-10-CM | POA: Diagnosis not present

## 2019-12-14 DIAGNOSIS — Z792 Long term (current) use of antibiotics: Secondary | ICD-10-CM | POA: Diagnosis not present

## 2019-12-14 DIAGNOSIS — I10 Essential (primary) hypertension: Secondary | ICD-10-CM | POA: Diagnosis not present

## 2019-12-14 DIAGNOSIS — D849 Immunodeficiency, unspecified: Secondary | ICD-10-CM | POA: Diagnosis not present

## 2019-12-14 DIAGNOSIS — Z7952 Long term (current) use of systemic steroids: Secondary | ICD-10-CM | POA: Diagnosis not present

## 2019-12-14 DIAGNOSIS — B259 Cytomegaloviral disease, unspecified: Secondary | ICD-10-CM | POA: Diagnosis not present

## 2019-12-14 DIAGNOSIS — E872 Acidosis: Secondary | ICD-10-CM | POA: Diagnosis not present

## 2019-12-14 DIAGNOSIS — Z20822 Contact with and (suspected) exposure to covid-19: Secondary | ICD-10-CM | POA: Diagnosis not present

## 2019-12-14 DIAGNOSIS — Z4822 Encounter for aftercare following kidney transplant: Secondary | ICD-10-CM | POA: Diagnosis not present

## 2019-12-14 DIAGNOSIS — R112 Nausea with vomiting, unspecified: Secondary | ICD-10-CM | POA: Diagnosis not present

## 2019-12-14 DIAGNOSIS — R05 Cough: Secondary | ICD-10-CM | POA: Diagnosis not present

## 2019-12-14 DIAGNOSIS — Z79899 Other long term (current) drug therapy: Secondary | ICD-10-CM | POA: Diagnosis not present

## 2019-12-14 DIAGNOSIS — Z940 Kidney transplant status: Secondary | ICD-10-CM | POA: Diagnosis not present

## 2019-12-14 DIAGNOSIS — D649 Anemia, unspecified: Secondary | ICD-10-CM | POA: Diagnosis not present

## 2020-01-09 DIAGNOSIS — Z23 Encounter for immunization: Secondary | ICD-10-CM | POA: Diagnosis not present

## 2020-01-11 DIAGNOSIS — Z792 Long term (current) use of antibiotics: Secondary | ICD-10-CM | POA: Diagnosis not present

## 2020-01-11 DIAGNOSIS — R112 Nausea with vomiting, unspecified: Secondary | ICD-10-CM | POA: Diagnosis not present

## 2020-01-11 DIAGNOSIS — Z79899 Other long term (current) drug therapy: Secondary | ICD-10-CM | POA: Diagnosis not present

## 2020-01-11 DIAGNOSIS — I10 Essential (primary) hypertension: Secondary | ICD-10-CM | POA: Diagnosis not present

## 2020-01-11 DIAGNOSIS — Z940 Kidney transplant status: Secondary | ICD-10-CM | POA: Diagnosis not present

## 2020-01-11 DIAGNOSIS — Z79890 Hormone replacement therapy (postmenopausal): Secondary | ICD-10-CM | POA: Diagnosis not present

## 2020-01-11 DIAGNOSIS — D649 Anemia, unspecified: Secondary | ICD-10-CM | POA: Diagnosis not present

## 2020-01-11 DIAGNOSIS — Z7952 Long term (current) use of systemic steroids: Secondary | ICD-10-CM | POA: Diagnosis not present

## 2020-01-11 DIAGNOSIS — D849 Immunodeficiency, unspecified: Secondary | ICD-10-CM | POA: Diagnosis not present

## 2020-01-11 DIAGNOSIS — E785 Hyperlipidemia, unspecified: Secondary | ICD-10-CM | POA: Diagnosis not present

## 2020-01-22 IMAGING — US US THYROID
1 series · 6 of 6 positions shown · non-contrast
Comparison: None.

CLINICAL DATA: Other.  Thyromegaly.

EXAM:
THYROID ULTRASOUND
TECHNIQUE: Ultrasound examination of the thyroid gland and adjacent soft
tissues was performed.

[Series 1: us thyroid · 0.06mm/px · 6 of 6 slices shown]
[im 1/6]
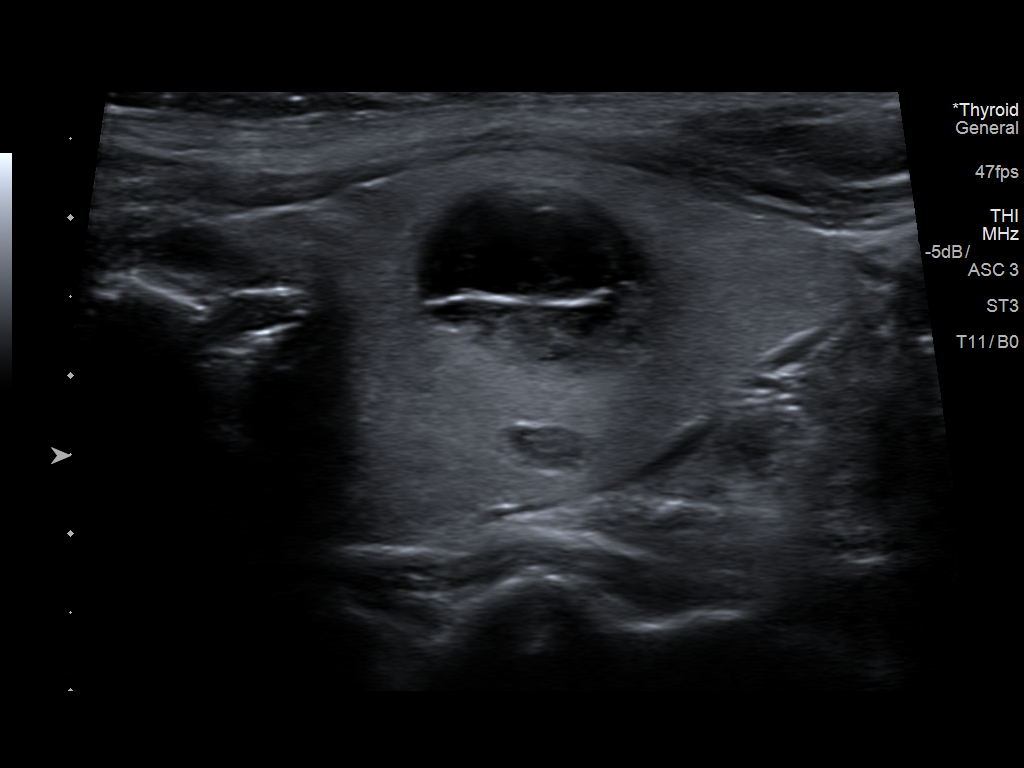
[im 2/6]
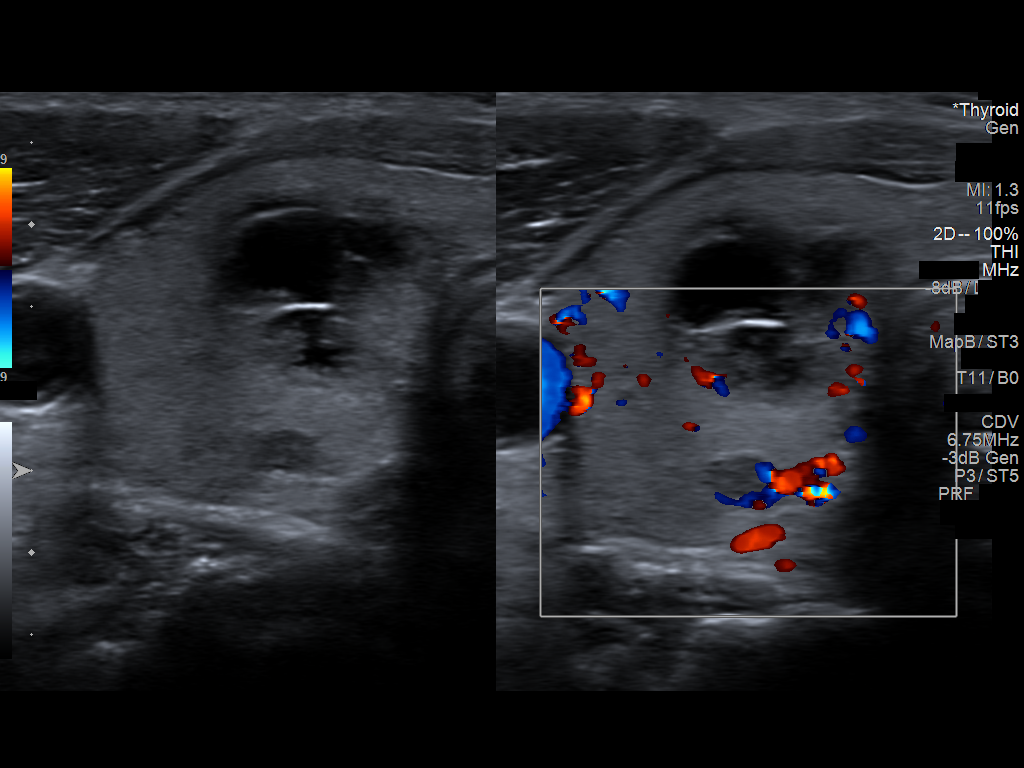
[im 3/6]
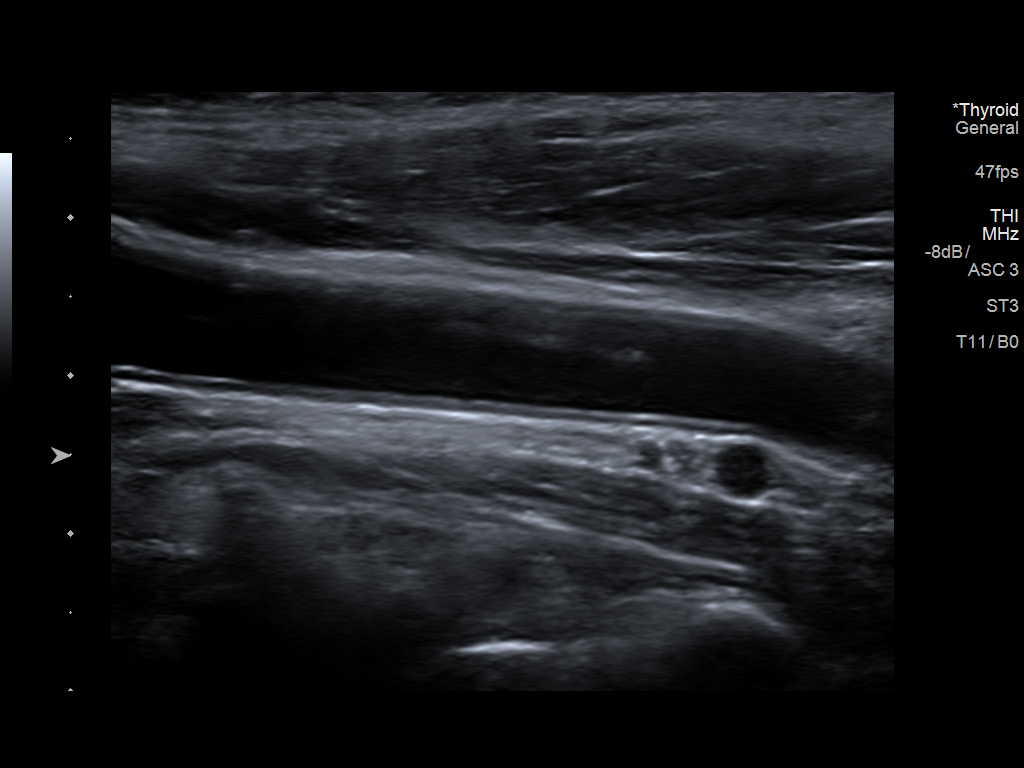
[im 4/6]
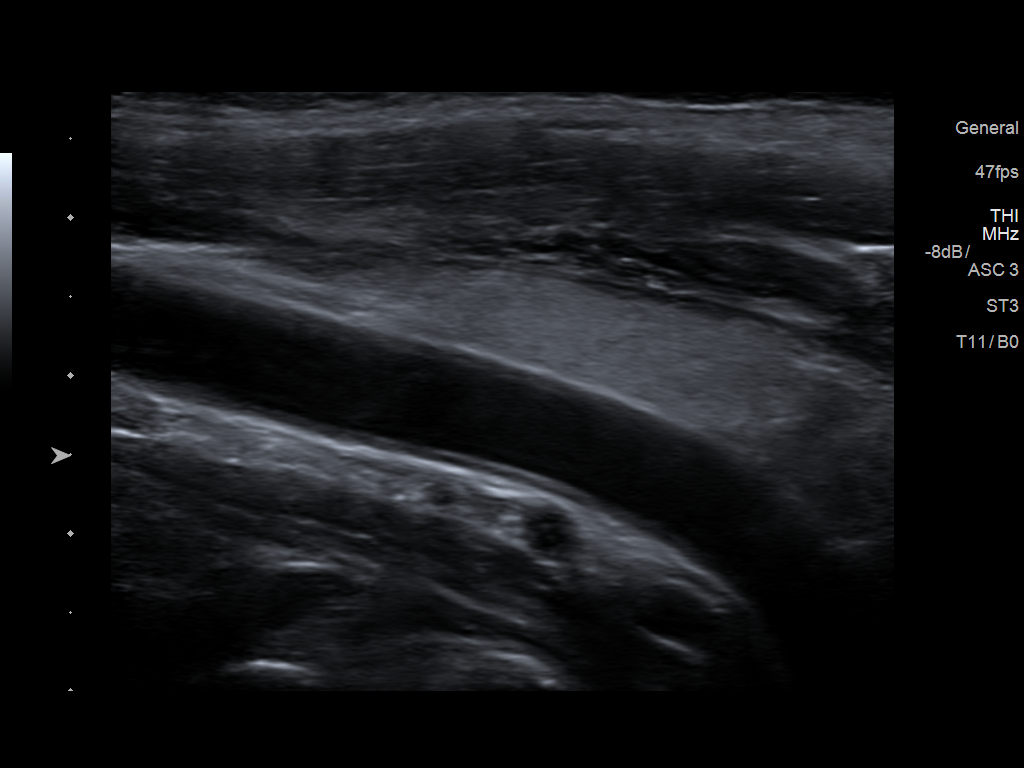
[im 5/6]
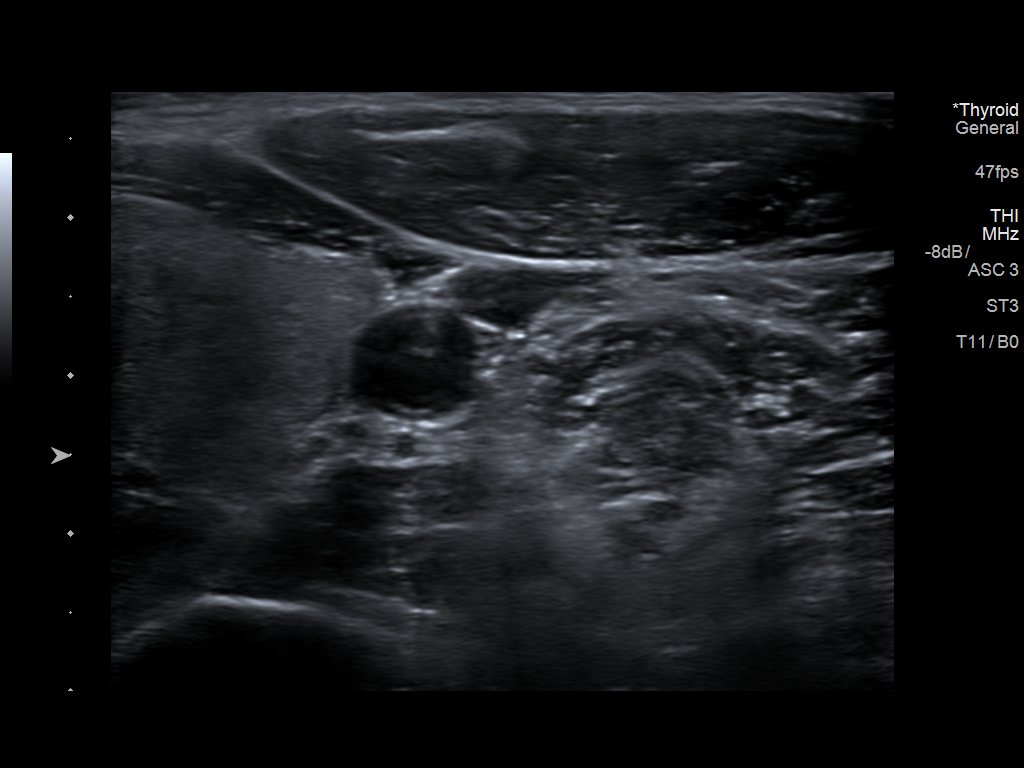
[im 6/6]
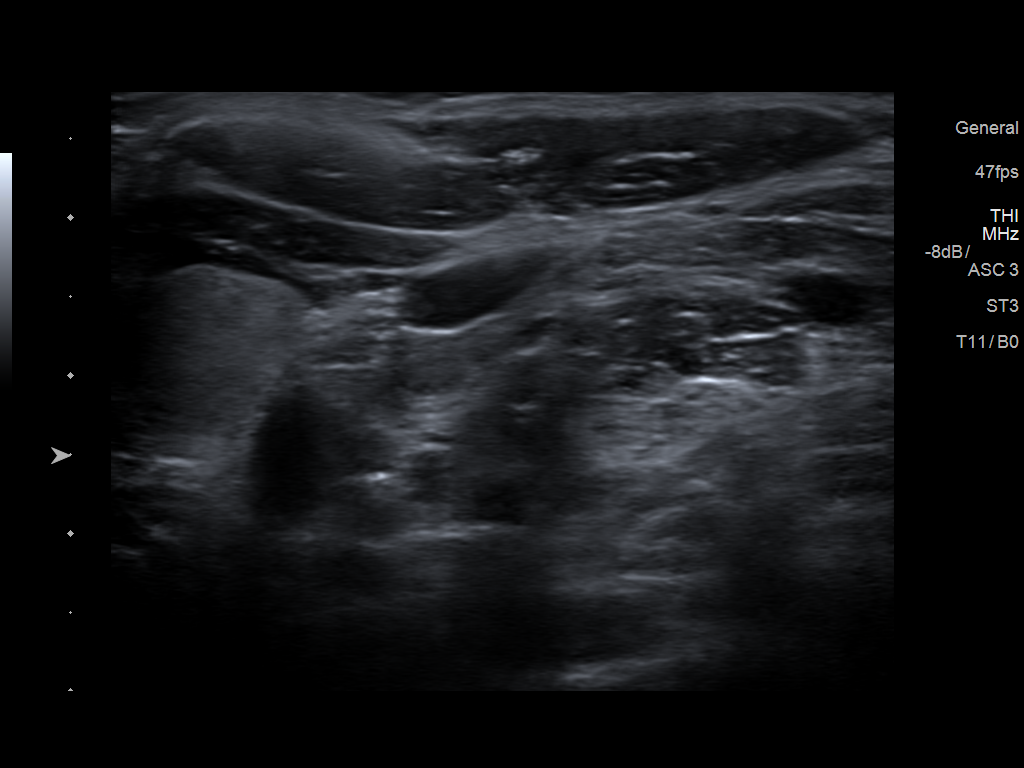

[6 of 6 positions shown; findings below may reference images not displayed]

FINDINGS: Parenchymal Echotexture: Moderately heterogenous

Isthmus: 0.3 cm

Right lobe: 5.5 x 2.2 x 2.2 cm

Left lobe: 5.0 x 1.7 x 2.0 cm

_________________________________________________________

Estimated total number of nodules >/= 1 cm: 2

Number of spongiform nodules >/=  2 cm not described below (TR1): 0

Number of mixed cystic and solid nodules >/= 1.5 cm not described
below (TR2): 0

_________________________________________________________

Nodule # 2:

Location: Right; Mid

Maximum size: 1.7 cm; Other 2 dimensions: 1.7 x 1.3 cm

Composition: mixed cystic and solid (1)

Echogenicity: isoechoic (1)

Shape: not taller-than-wide (0)

Margins: smooth (0)

Echogenic foci: macrocalcifications (1)

ACR TI-RADS total points: 4.

ACR TI-RADS risk category: TR4 (4-6 points).

ACR TI-RADS recommendations:

**Given size (>/= 1.5 cm) and appearance, fine needle aspiration of
this moderately suspicious nodule should be considered based on
TI-RADS criteria.

_________________________________________________________

Nodule # 4:

Location: Left; Mid

Maximum size: 1.0 cm; Other 2 dimensions: 0.9 x 0.5 cm

Composition: solid/almost completely solid (2)

Echogenicity: very hypoechoic (3)

Shape: not taller-than-wide (0)

Margins: smooth (0)

Echogenic foci: peripheral calcifications (2)

ACR TI-RADS total points: 7.

ACR TI-RADS risk category: TR5 (>/= 7 points).

ACR TI-RADS recommendations:

**Given size (>/= 1.0 cm) and appearance, fine needle aspiration of
this highly suspicious nodule should be considered based on TI-RADS
criteria.

_________________________________________________________

Other nodules measure 0.8 cm or less in size and do not meet
criteria for biopsy nor follow-up.
IMPRESSION: Nodules 2 and 4 meet criteria for fine needle aspiration biopsy.

The above is in keeping with the ACR TI-RADS recommendations - [HOSPITAL] 0896;[DATE].

## 2020-01-22 IMAGING — US US THYROID
1 series · 13 of 25 positions shown · non-contrast
Comparison: None.

CLINICAL DATA: Other.  Thyromegaly.

EXAM:
THYROID ULTRASOUND
TECHNIQUE: Ultrasound examination of the thyroid gland and adjacent soft
tissues was performed.

[Series 1: us thyroid · 0.06mm/px · 13 of 39 slices shown]
[im 1/39]
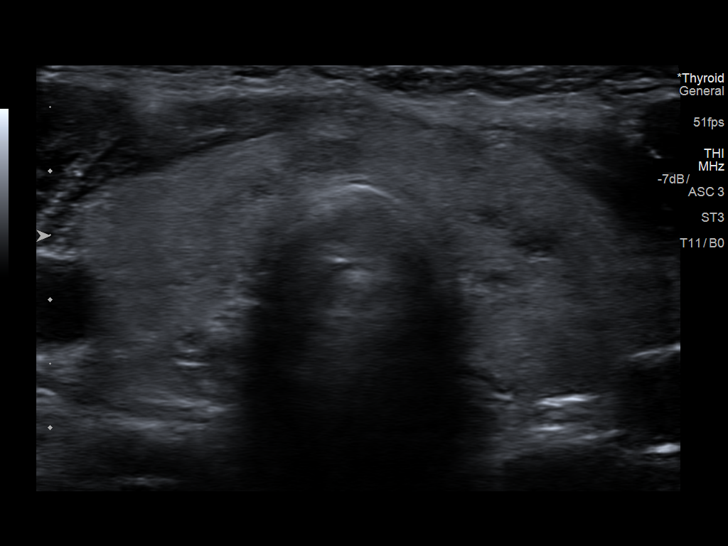
[im 4/39]
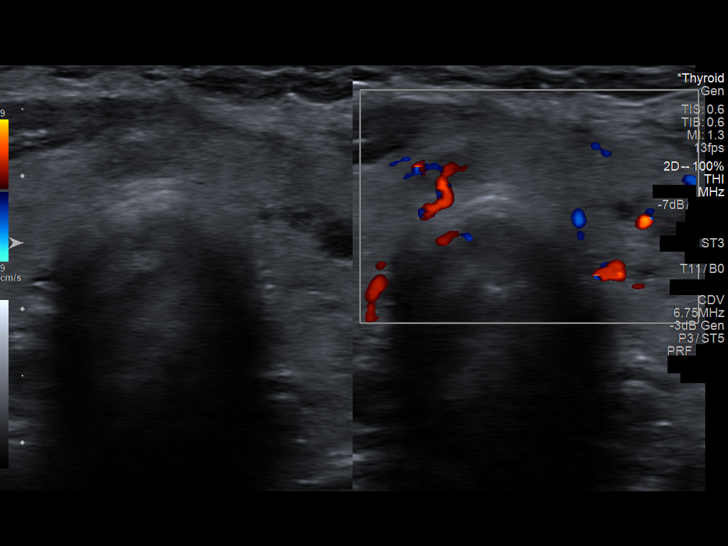
[im 7/39]
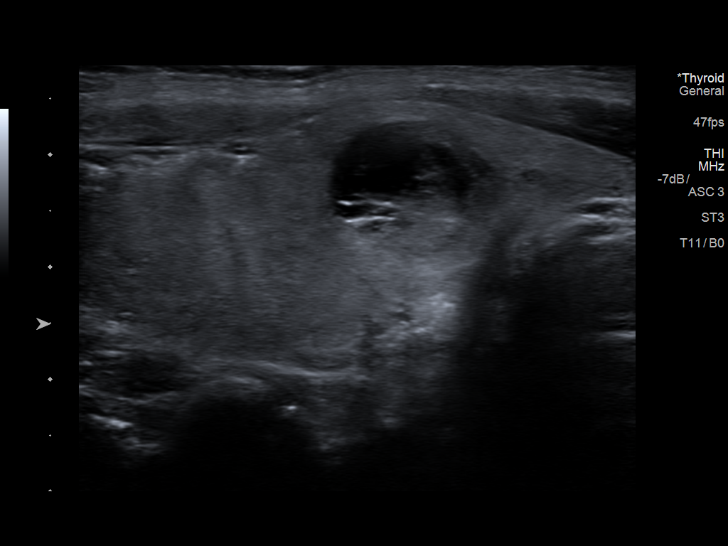
[im 10/39]
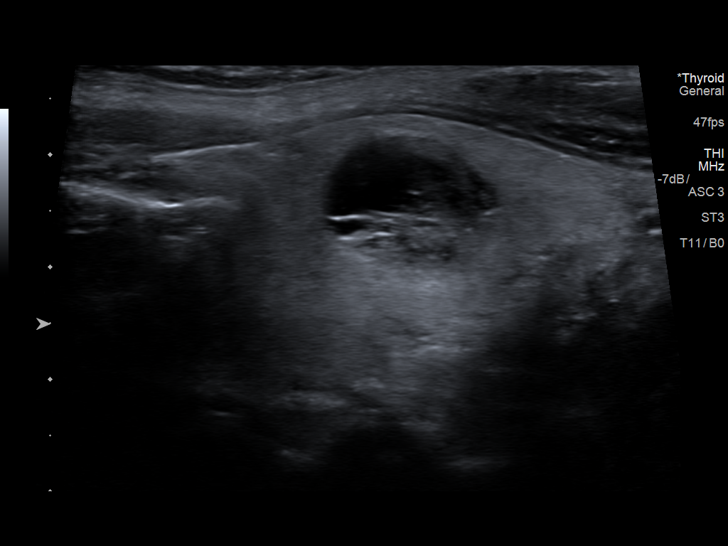
[im 13/39]
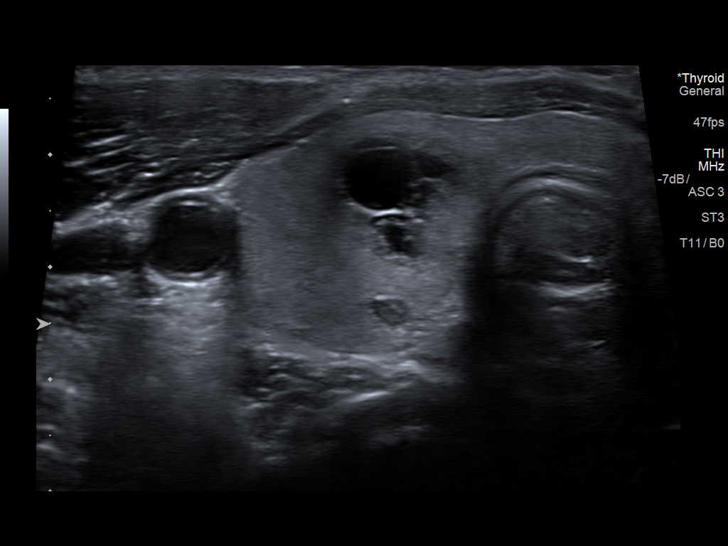
[im 16/39]
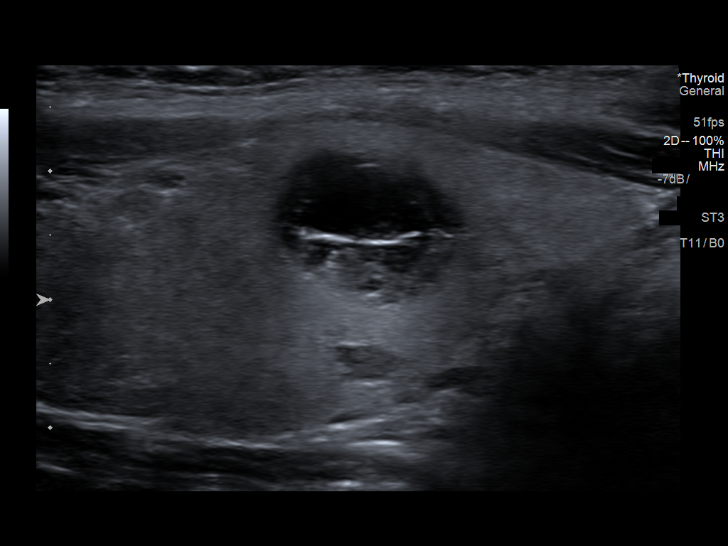
[im 20/39]
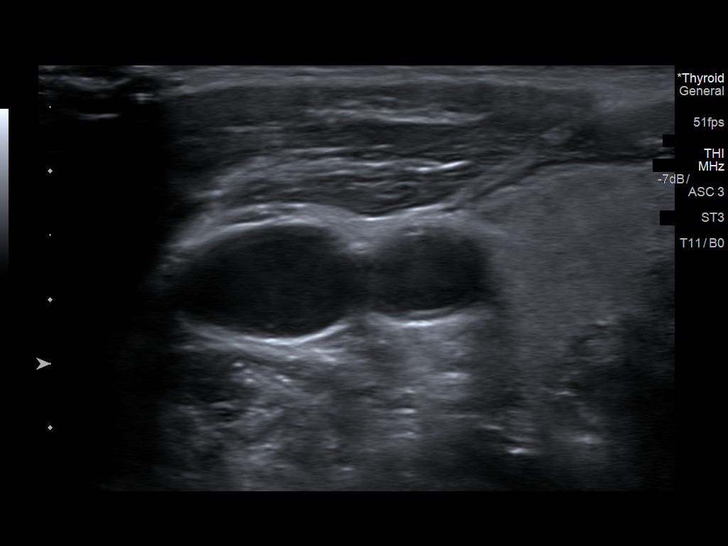
[im 23/39]
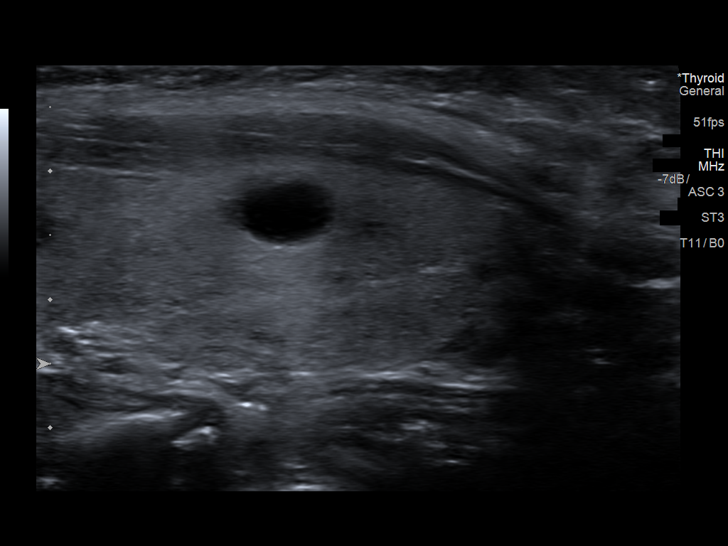
[im 26/39]
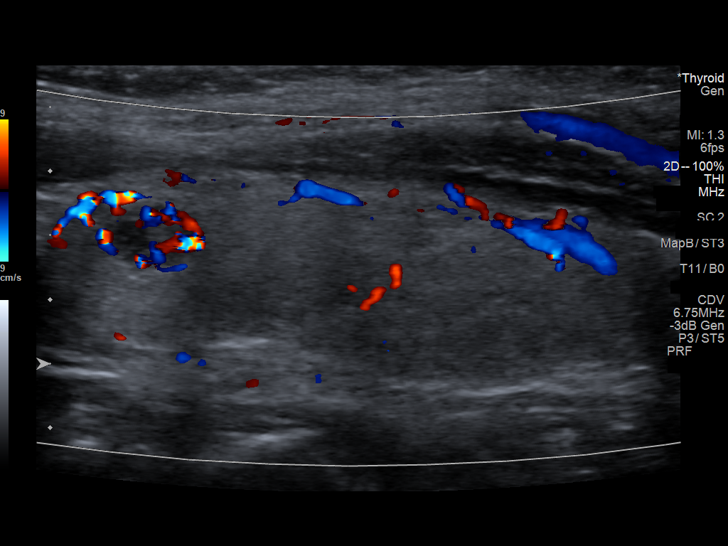
[im 29/39]
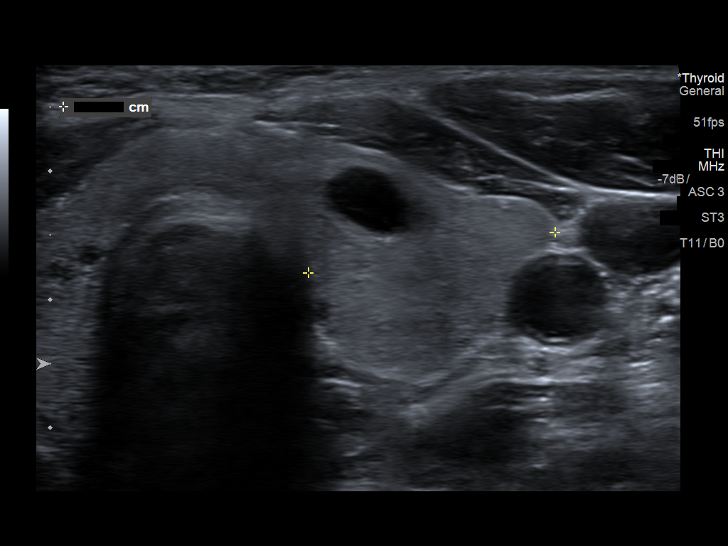
[im 32/39]
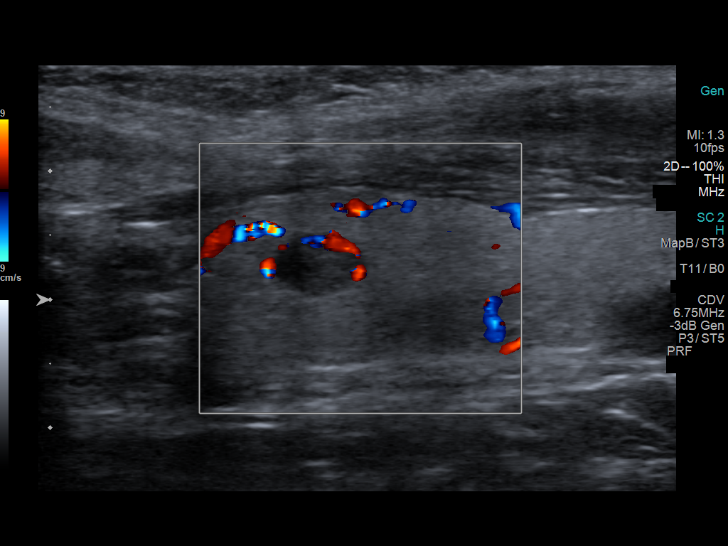
[im 35/39]
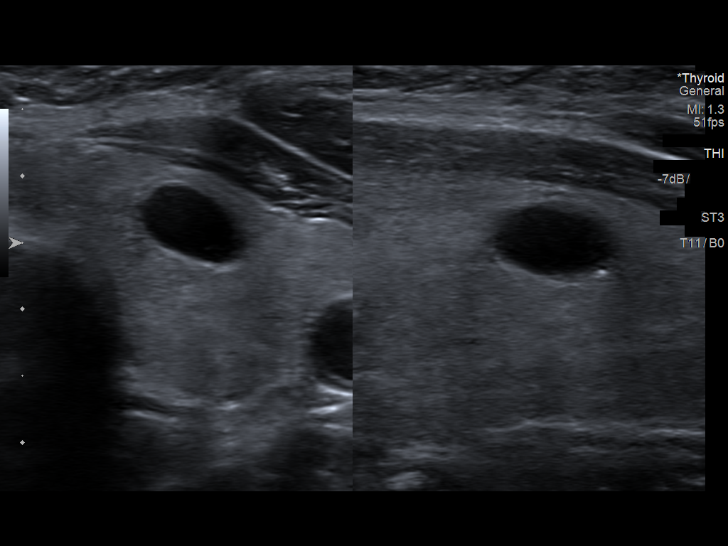
[im 39/39]
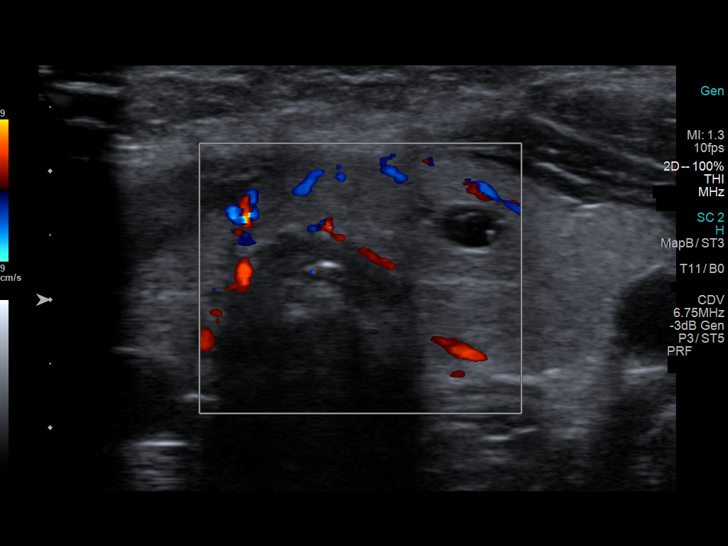

[13 of 25 positions shown; findings below may reference images not displayed]

FINDINGS: Parenchymal Echotexture: Moderately heterogenous

Isthmus: 0.3 cm

Right lobe: 5.5 x 2.2 x 2.2 cm

Left lobe: 5.0 x 1.7 x 2.0 cm

_________________________________________________________

Estimated total number of nodules >/= 1 cm: 2

Number of spongiform nodules >/=  2 cm not described below (TR1): 0

Number of mixed cystic and solid nodules >/= 1.5 cm not described
below (TR2): 0

_________________________________________________________

Nodule # 2:

Location: Right; Mid

Maximum size: 1.7 cm; Other 2 dimensions: 1.7 x 1.3 cm

Composition: mixed cystic and solid (1)

Echogenicity: isoechoic (1)

Shape: not taller-than-wide (0)

Margins: smooth (0)

Echogenic foci: macrocalcifications (1)

ACR TI-RADS total points: 4.

ACR TI-RADS risk category: TR4 (4-6 points).

ACR TI-RADS recommendations:

**Given size (>/= 1.5 cm) and appearance, fine needle aspiration of
this moderately suspicious nodule should be considered based on
TI-RADS criteria.

_________________________________________________________

Nodule # 4:

Location: Left; Mid

Maximum size: 1.0 cm; Other 2 dimensions: 0.9 x 0.5 cm

Composition: solid/almost completely solid (2)

Echogenicity: very hypoechoic (3)

Shape: not taller-than-wide (0)

Margins: smooth (0)

Echogenic foci: peripheral calcifications (2)

ACR TI-RADS total points: 7.

ACR TI-RADS risk category: TR5 (>/= 7 points).

ACR TI-RADS recommendations:

**Given size (>/= 1.0 cm) and appearance, fine needle aspiration of
this highly suspicious nodule should be considered based on TI-RADS
criteria.

_________________________________________________________

Other nodules measure 0.8 cm or less in size and do not meet
criteria for biopsy nor follow-up.
IMPRESSION: Nodules 2 and 4 meet criteria for fine needle aspiration biopsy.

The above is in keeping with the ACR TI-RADS recommendations - [HOSPITAL] 0896;[DATE].

## 2020-04-02 ENCOUNTER — Encounter (HOSPITAL_COMMUNITY): Payer: Self-pay | Admitting: Emergency Medicine

## 2020-04-02 ENCOUNTER — Emergency Department (HOSPITAL_COMMUNITY): Payer: Medicare Other

## 2020-04-02 ENCOUNTER — Inpatient Hospital Stay (HOSPITAL_COMMUNITY)
Admission: EM | Admit: 2020-04-02 | Discharge: 2020-04-06 | DRG: 177 | Disposition: A | Discharge status: Home / Self Care | Payer: Medicare Other | Attending: Internal Medicine | Admitting: Internal Medicine

## 2020-04-02 ENCOUNTER — Other Ambulatory Visit: Payer: Self-pay

## 2020-04-02 DIAGNOSIS — Z7952 Long term (current) use of systemic steroids: Secondary | ICD-10-CM

## 2020-04-02 DIAGNOSIS — Z833 Family history of diabetes mellitus: Secondary | ICD-10-CM | POA: Diagnosis not present

## 2020-04-02 DIAGNOSIS — E872 Acidosis: Secondary | ICD-10-CM | POA: Diagnosis present

## 2020-04-02 DIAGNOSIS — N179 Acute kidney failure, unspecified: Secondary | ICD-10-CM | POA: Diagnosis not present

## 2020-04-02 DIAGNOSIS — D84821 Immunodeficiency due to drugs: Secondary | ICD-10-CM | POA: Diagnosis present

## 2020-04-02 DIAGNOSIS — Y830 Surgical operation with transplant of whole organ as the cause of abnormal reaction of the patient, or of later complication, without mention of misadventure at the time of the procedure: Secondary | ICD-10-CM | POA: Diagnosis present

## 2020-04-02 DIAGNOSIS — T8619 Other complication of kidney transplant: Secondary | ICD-10-CM | POA: Diagnosis present

## 2020-04-02 DIAGNOSIS — F1290 Cannabis use, unspecified, uncomplicated: Secondary | ICD-10-CM | POA: Diagnosis present

## 2020-04-02 DIAGNOSIS — Z803 Family history of malignant neoplasm of breast: Secondary | ICD-10-CM | POA: Diagnosis not present

## 2020-04-02 DIAGNOSIS — R112 Nausea with vomiting, unspecified: Secondary | ICD-10-CM

## 2020-04-02 DIAGNOSIS — Z8249 Family history of ischemic heart disease and other diseases of the circulatory system: Secondary | ICD-10-CM | POA: Diagnosis not present

## 2020-04-02 DIAGNOSIS — U071 COVID-19: Principal | ICD-10-CM | POA: Diagnosis present

## 2020-04-02 DIAGNOSIS — Z79899 Other long term (current) drug therapy: Secondary | ICD-10-CM | POA: Diagnosis not present

## 2020-04-02 DIAGNOSIS — J9601 Acute respiratory failure with hypoxia: Secondary | ICD-10-CM | POA: Diagnosis not present

## 2020-04-02 DIAGNOSIS — Z885 Allergy status to narcotic agent status: Secondary | ICD-10-CM | POA: Diagnosis not present

## 2020-04-02 DIAGNOSIS — R109 Unspecified abdominal pain: Secondary | ICD-10-CM

## 2020-04-02 DIAGNOSIS — Z841 Family history of disorders of kidney and ureter: Secondary | ICD-10-CM | POA: Diagnosis not present

## 2020-04-02 DIAGNOSIS — R05 Cough: Secondary | ICD-10-CM | POA: Diagnosis not present

## 2020-04-02 DIAGNOSIS — I10 Essential (primary) hypertension: Secondary | ICD-10-CM | POA: Diagnosis not present

## 2020-04-02 DIAGNOSIS — Z888 Allergy status to other drugs, medicaments and biological substances status: Secondary | ICD-10-CM

## 2020-04-02 DIAGNOSIS — Z87891 Personal history of nicotine dependence: Secondary | ICD-10-CM

## 2020-04-02 DIAGNOSIS — N189 Chronic kidney disease, unspecified: Secondary | ICD-10-CM | POA: Diagnosis not present

## 2020-04-02 DIAGNOSIS — M545 Low back pain: Secondary | ICD-10-CM | POA: Diagnosis not present

## 2020-04-02 DIAGNOSIS — R1110 Vomiting, unspecified: Secondary | ICD-10-CM | POA: Diagnosis not present

## 2020-04-02 DIAGNOSIS — Z940 Kidney transplant status: Secondary | ICD-10-CM | POA: Diagnosis not present

## 2020-04-02 DIAGNOSIS — J1282 Pneumonia due to coronavirus disease 2019: Secondary | ICD-10-CM | POA: Diagnosis present

## 2020-04-02 DIAGNOSIS — R1084 Generalized abdominal pain: Secondary | ICD-10-CM | POA: Diagnosis not present

## 2020-04-02 DIAGNOSIS — F12988 Cannabis use, unspecified with other cannabis-induced disorder: Secondary | ICD-10-CM | POA: Diagnosis not present

## 2020-04-02 LAB — CREATININE, URINE, RANDOM: Creatinine, Urine: 20.18 mg/dL

## 2020-04-02 LAB — I-STAT BETA HCG BLOOD, ED (MC, WL, AP ONLY): I-stat hCG, quantitative: 14.6 m[IU]/mL — ABNORMAL HIGH (ref <5)

## 2020-04-02 LAB — CBC WITH DIFFERENTIAL/PLATELET
Abs Immature Granulocytes: 0.02 10*3/uL (ref 0.00–0.07)
Basophils Absolute: 0 10*3/uL (ref 0.0–0.1)
Basophils Relative: 0 %
Eosinophils Absolute: 0 10*3/uL (ref 0.0–0.5)
Eosinophils Relative: 1 %
HCT: 45.9 % (ref 36.0–46.0)
Hemoglobin: 15.3 g/dL — ABNORMAL HIGH (ref 12.0–15.0)
Immature Granulocytes: 0 %
Lymphocytes Relative: 13 %
Lymphs Abs: 0.9 10*3/uL (ref 0.7–4.0)
MCH: 30.7 pg (ref 26.0–34.0)
MCHC: 33.3 g/dL (ref 30.0–36.0)
MCV: 92.2 fL (ref 80.0–100.0)
Monocytes Absolute: 0.6 10*3/uL (ref 0.1–1.0)
Monocytes Relative: 8 %
Neutro Abs: 5.6 10*3/uL (ref 1.7–7.7)
Neutrophils Relative %: 78 %
Platelets: 160 10*3/uL (ref 150–400)
RBC: 4.98 MIL/uL (ref 3.87–5.11)
RDW: 13.2 % (ref 11.5–15.5)
WBC: 7.1 10*3/uL (ref 4.0–10.5)
nRBC: 0 % (ref 0.0–0.2)

## 2020-04-02 LAB — BASIC METABOLIC PANEL
Anion gap: 15 (ref 5–15)
BUN: 16 mg/dL (ref 6–20)
CO2: 17 mmol/L — ABNORMAL LOW (ref 22–32)
Calcium: 10.5 mg/dL — ABNORMAL HIGH (ref 8.9–10.3)
Chloride: 106 mmol/L (ref 98–111)
Creatinine, Ser: 1.55 mg/dL — ABNORMAL HIGH (ref 0.44–1.00)
GFR calc Af Amer: 46 mL/min — ABNORMAL LOW (ref >60)
GFR calc non Af Amer: 40 mL/min — ABNORMAL LOW (ref >60)
Glucose, Bld: 128 mg/dL — ABNORMAL HIGH (ref 70–99)
Potassium: 3.8 mmol/L (ref 3.5–5.1)
Sodium: 138 mmol/L (ref 135–145)

## 2020-04-02 LAB — URINALYSIS, ROUTINE W REFLEX MICROSCOPIC
Bilirubin Urine: NEGATIVE
Glucose, UA: 150 mg/dL — AB
Ketones, ur: 20 mg/dL — AB
Leukocytes,Ua: NEGATIVE
Nitrite: NEGATIVE
Protein, ur: 100 mg/dL — AB
Specific Gravity, Urine: 1.013 (ref 1.005–1.030)
pH: 7 (ref 5.0–8.0)

## 2020-04-02 LAB — HEPATIC FUNCTION PANEL
ALT: 30 U/L (ref 0–44)
AST: 33 U/L (ref 15–41)
Albumin: 4.5 g/dL (ref 3.5–5.0)
Alkaline Phosphatase: 57 U/L (ref 38–126)
Bilirubin, Direct: 0.1 mg/dL (ref 0.0–0.2)
Indirect Bilirubin: 0.8 mg/dL (ref 0.3–0.9)
Total Bilirubin: 0.9 mg/dL (ref 0.3–1.2)
Total Protein: 7.8 g/dL (ref 6.5–8.1)

## 2020-04-02 LAB — SARS CORONAVIRUS 2 BY RT PCR (HOSPITAL ORDER, PERFORMED IN ~~LOC~~ HOSPITAL LAB): SARS Coronavirus 2: POSITIVE — AB

## 2020-04-02 LAB — C-REACTIVE PROTEIN: CRP: 0.8 mg/dL (ref <1.0)

## 2020-04-02 LAB — HIV ANTIBODY (ROUTINE TESTING W REFLEX): HIV Screen 4th Generation wRfx: NONREACTIVE

## 2020-04-02 LAB — HCG, QUANTITATIVE, PREGNANCY: hCG, Beta Chain, Quant, S: 12 m[IU]/mL — ABNORMAL HIGH (ref <5)

## 2020-04-02 LAB — SODIUM, URINE, RANDOM: Sodium, Ur: 172 mmol/L

## 2020-04-02 LAB — TROPONIN I (HIGH SENSITIVITY)
Troponin I (High Sensitivity): 19 ng/L — ABNORMAL HIGH (ref <18)
Troponin I (High Sensitivity): 22 ng/L — ABNORMAL HIGH (ref <18)

## 2020-04-02 LAB — ABO/RH: ABO/RH(D): A POS

## 2020-04-02 LAB — D-DIMER, QUANTITATIVE: D-Dimer, Quant: 1.29 ug/mL-FEU — ABNORMAL HIGH (ref 0.00–0.50)

## 2020-04-02 MED ORDER — SODIUM CHLORIDE 0.9 % IV SOLN
1.0000 g | Freq: Once | INTRAVENOUS | Status: AC
  Administered 2020-04-02: 1 g via INTRAVENOUS
  Filled 2020-04-02: qty 10

## 2020-04-02 MED ORDER — DICLOFENAC SODIUM 1 % EX GEL
2.0000 g | Freq: Four times a day (QID) | CUTANEOUS | Status: DC
  Administered 2020-04-03 – 2020-04-05 (×10): 2 g via TOPICAL
  Filled 2020-04-02 (×2): qty 100

## 2020-04-02 MED ORDER — GUAIFENESIN-DM 100-10 MG/5ML PO SYRP: 10.0000 mL | ORAL_SOLUTION | ORAL | Status: DC | PRN

## 2020-04-02 MED ORDER — SODIUM CHLORIDE 0.9 % IV SOLN
200.0000 mg | Freq: Once | INTRAVENOUS | Status: AC
  Administered 2020-04-02: 200 mg via INTRAVENOUS
  Filled 2020-04-02: qty 40

## 2020-04-02 MED ORDER — LACTATED RINGERS IV SOLN
INTRAVENOUS | Status: DC
  Administered 2020-04-02: 125 mL/h via INTRAVENOUS

## 2020-04-02 MED ORDER — ONDANSETRON 4 MG PO TBDP
4.0000 mg | ORAL_TABLET | Freq: Once | ORAL | Status: AC
  Administered 2020-04-02: 4 mg via ORAL
  Filled 2020-04-02: qty 1

## 2020-04-02 MED ORDER — ONDANSETRON HCL 4 MG/2ML IJ SOLN
4.0000 mg | Freq: Once | INTRAMUSCULAR | Status: AC
  Administered 2020-04-02: 4 mg via INTRAVENOUS
  Filled 2020-04-02: qty 2

## 2020-04-02 MED ORDER — HEPARIN SODIUM (PORCINE) 5000 UNIT/ML IJ SOLN
5000.0000 [IU] | Freq: Three times a day (TID) | INTRAMUSCULAR | Status: DC
  Administered 2020-04-02: 5000 [IU] via SUBCUTANEOUS
  Filled 2020-04-02: qty 1

## 2020-04-02 MED ORDER — HYDROMORPHONE HCL 1 MG/ML IJ SOLN
1.0000 mg | Freq: Once | INTRAMUSCULAR | Status: AC
  Administered 2020-04-02: 1 mg via INTRAVENOUS
  Filled 2020-04-02: qty 1

## 2020-04-02 MED ORDER — ONDANSETRON HCL 4 MG/2ML IJ SOLN
4.0000 mg | INTRAMUSCULAR | Status: DC | PRN
  Administered 2020-04-03 – 2020-04-04 (×3): 4 mg via INTRAVENOUS
  Filled 2020-04-02 (×4): qty 2

## 2020-04-02 MED ORDER — SODIUM CHLORIDE 0.9 % IV SOLN
500.0000 mg | Freq: Once | INTRAVENOUS | Status: AC
  Administered 2020-04-02: 500 mg via INTRAVENOUS
  Filled 2020-04-02: qty 500

## 2020-04-02 MED ORDER — LACTATED RINGERS IV BOLUS
1000.0000 mL | Freq: Once | INTRAVENOUS | Status: AC
  Administered 2020-04-02: 1000 mL via INTRAVENOUS

## 2020-04-02 MED ORDER — METHYLPREDNISOLONE SODIUM SUCC 40 MG IJ SOLR
0.5000 mg/kg | Freq: Two times a day (BID) | INTRAMUSCULAR | Status: DC
  Administered 2020-04-02 – 2020-04-06 (×9): 38 mg via INTRAVENOUS
  Filled 2020-04-02 (×9): qty 1

## 2020-04-02 MED ORDER — PANTOPRAZOLE SODIUM 40 MG PO TBEC
40.0000 mg | DELAYED_RELEASE_TABLET | Freq: Every day | ORAL | Status: DC
  Administered 2020-04-03 – 2020-04-06 (×4): 40 mg via ORAL
  Filled 2020-04-02 (×4): qty 1

## 2020-04-02 MED ORDER — TACROLIMUS ER 1 MG PO TB24: 6.0000 mg | ORAL_TABLET | Freq: Every day | ORAL | Status: DC

## 2020-04-02 MED ORDER — METHOCARBAMOL 500 MG PO TABS
500.0000 mg | ORAL_TABLET | Freq: Once | ORAL | Status: AC | PRN
  Administered 2020-04-02: 500 mg via ORAL

## 2020-04-02 MED ORDER — NITROGLYCERIN IN D5W 200-5 MCG/ML-% IV SOLN
0.0000 ug/min | INTRAVENOUS | Status: DC
  Administered 2020-04-02: 15 ug/min via INTRAVENOUS
  Filled 2020-04-02: qty 250

## 2020-04-02 MED ORDER — AMLODIPINE BESYLATE 10 MG PO TABS
10.0000 mg | ORAL_TABLET | Freq: Every day | ORAL | Status: DC
  Administered 2020-04-02 – 2020-04-05 (×4): 10 mg via ORAL
  Filled 2020-04-02: qty 1
  Filled 2020-04-02 (×2): qty 2
  Filled 2020-04-02 (×2): qty 1

## 2020-04-02 MED ORDER — LABETALOL HCL 5 MG/ML IV SOLN
20.0000 mg | Freq: Once | INTRAVENOUS | Status: AC
  Administered 2020-04-02: 20 mg via INTRAVENOUS
  Filled 2020-04-02: qty 4

## 2020-04-02 MED ORDER — SODIUM CHLORIDE 0.9 % IV SOLN
100.0000 mg | Freq: Every day | INTRAVENOUS | Status: AC
  Administered 2020-04-03 – 2020-04-06 (×4): 100 mg via INTRAVENOUS
  Filled 2020-04-02 (×4): qty 20

## 2020-04-02 MED ORDER — LABETALOL HCL 5 MG/ML IV SOLN: 5.0000 mg | INTRAVENOUS | Status: DC | PRN

## 2020-04-02 MED ORDER — LACTATED RINGERS IV BOLUS
500.0000 mL | Freq: Once | INTRAVENOUS | Status: AC
  Administered 2020-04-02: 500 mL via INTRAVENOUS

## 2020-04-02 MED ORDER — LABETALOL HCL 200 MG PO TABS
300.0000 mg | ORAL_TABLET | Freq: Two times a day (BID) | ORAL | Status: DC
  Administered 2020-04-02 – 2020-04-06 (×8): 300 mg via ORAL
  Filled 2020-04-02 (×4): qty 1
  Filled 2020-04-02 (×3): qty 2
  Filled 2020-04-02 (×2): qty 1

## 2020-04-02 MED ORDER — SENNOSIDES-DOCUSATE SODIUM 8.6-50 MG PO TABS: 1.0000 | ORAL_TABLET | Freq: Every evening | ORAL | Status: DC | PRN

## 2020-04-02 MED ORDER — METHOCARBAMOL 500 MG PO TABS
750.0000 mg | ORAL_TABLET | Freq: Four times a day (QID) | ORAL | Status: DC | PRN
  Filled 2020-04-02: qty 2

## 2020-04-02 MED ORDER — HYDROMORPHONE HCL 1 MG/ML IJ SOLN
1.0000 mg | INTRAMUSCULAR | Status: DC | PRN
  Administered 2020-04-02: 1 mg via INTRAVENOUS
  Filled 2020-04-02: qty 1

## 2020-04-02 NOTE — ED Provider Notes (Signed)
[facility] Provider Note   CSN: [id number] Arrival date & time: 04/02/20  4081     History Chief Complaint  Patient presents with  . Emesis    Body Aches    Melody [name] is a 44 y.o. female.   Emesis Severity:  Moderate Duration:  2 days Timing:  Constant Number of daily episodes:  Numerous Quality:  Stomach contents Progression:  Worsening Chronicity:  New Context: not post-tussive and not self-induced   Relieved by:  Nothing Worsened by:  Nothing Ineffective treatments:  None tried Associated symptoms: cough and myalgias   Associated symptoms: no abdominal pain, no arthralgias, no chills, no fever and no sore throat    44 year old female with history of CKD status post renal transplant and history of hypertension who presents with intractable nausea vomiting, myalgias.  Patient states for the past 2 days she has had numerous episodes of nonbilious nonbloody emesis associated with myalgias and intermittent productive cough.  States that she received her Covid vaccine back in March.  Endorses intermittent shortness of breath but denies chest pain, diarrhea, hematuria, dysuria.    Past Medical History:  Diagnosis Date  . Alcohol abuse   . Anemia   . Chronic kidney disease    staqge 5 M/W/F Dialysis  . Drug abuse ([place])   . Ectopic pregnancy   . Headache    Migraines  . Hypertension Dx May 2016  . Tobacco abuse     Patient Active Problem List   Diagnosis Date Noted  . Acute respiratory failure with hypoxia ([place]) 04/02/2020  . Intractable nausea and vomiting 04/02/2020  . Acute bursitis of left shoulder 05/18/2018  . Marijuana use, continuous 05/04/2018  . Multiple thyroid nodules 05/04/2018  . Cervical radiculopathy 05/04/2018  . ESRD (end stage renal disease) on dialysis ([place]) 09/29/2017  . Former tobacco use 09/29/2017  . Pre-transplant evaluation for kidney transplant 02/12/2017  . Anemia of chronic disease    . Drug abuse ([place])   . Essential hypertension     Past Surgical History:  Procedure Laterality Date  . AV FISTULA PLACEMENT Right 09/15/2016   Procedure: ARTERIOVENOUS (AV) FISTULA CREATION UPPER RIGHT ARM;  Surgeon: Rosetta Posner, MD;  Location: [facility];  Service: Vascular;  Laterality: Right;  . AV FISTULA PLACEMENT Left 12/30/2016   Procedure: ARTERIOVENOUS (AV) FISTULA CREATION USING GORETEX STRETCH GRAFT;  Surgeon: Elam Dutch, MD;  Location: [facility] OR;  Service: Vascular;  Laterality: Left;  . AV FISTULA PLACEMENT Left 06/15/2017   Procedure: INSERTION OF ARTERIOVENOUS (AV) GORE-TEX GRAFT 6MM X 40CM LEFT ARM;  Surgeon: Rosetta Posner, MD;  Location: [facility];  Service: Vascular;  Laterality: Left;  . AV FISTULA PLACEMENT Right 08/05/2018   Procedure: Insertion of Right arm gortex graft;  Surgeon: Rosetta Posner, MD;  Location: [facility];  Service: Vascular;  Laterality: Right;  . DILATION AND CURETTAGE OF UTERUS    . INSERTION OF DIALYSIS CATHETER Right 09/15/2016   Procedure: INSERTION OF DIALYSIS CATHETER ON RIGHT SIDE;  Surgeon: Rosetta Posner, MD;  Location: [facility] OR;  Service: Vascular;  Laterality: Right;     OB History   No obstetric history on file.     Family History  Problem Relation Age of Onset  . CAD Mother   . Hypertension Mother   . Heart disease Mother   . Kidney failure Mother   . Cirrhosis Father   . CAD Brother   . Hypertension Other   .  Kidney failure Other   . Hypertension Maternal Grandmother   . Kidney failure Maternal Grandmother   . Congestive Heart Failure Maternal Grandmother   . Breast cancer Sister   . Thyroid disease Neg Hx     Social History   Tobacco Use  . Smoking status: Former Smoker    Packs/day: 0.00    Years: 0.00    Pack years: 0.00    Types: Cigars, Cigarettes    Start date: 09/11/2016  . Smokeless tobacco: Never Used  Vaping Use  . Vaping Use: Never used  Substance Use Topics  . Alcohol use: Yes    Alcohol/week: 0.0 standard drinks      Comment: occasionally  . Drug use: Yes    Types: Marijuana    Home Medications Prior to Admission medications   Medication Sig Start Date End Date Taking? Authorizing Provider  acetaminophen (TYLENOL) 500 MG tablet Take 1,000 mg by mouth every 6 (six) hours as needed for mild pain or headache.   Yes [provider]  amLODipine (NORVASC) 10 MG tablet Take 1 tablet (10 mg total) by mouth at bedtime. 04/22/18  Yes Ladell Pier, MD  cinacalcet (SENSIPAR) 30 MG tablet Take 30 mg by mouth daily. 03/22/20  Yes [provider]  ENVARSUS XR 1 MG TB24 Take 6 mg by mouth daily.  02/08/20  Yes [provider]  labetalol (NORMODYNE) 200 MG tablet Take 200 mg by mouth 2 (two) times daily. 02/20/20  Yes [provider]  lidocaine-prilocaine (EMLA) cream Apply 1 application topically daily as needed for irritation. 04/23/17  Yes [provider]  mycophenolate (MYFORTIC) 180 MG EC tablet Take 540 mg by mouth 2 (two) times daily. 02/20/20  Yes [provider]  omeprazole (PRILOSEC) 20 MG capsule Take 20 mg by mouth 2 (two) times daily. 03/22/20  Yes [provider]  predniSONE (DELTASONE) 5 MG tablet Take 5 mg by mouth daily. 02/20/20  Yes [provider]  sulfamethoxazole-trimethoprim (BACTRIM) 400-80 MG tablet Take 1 tablet by mouth every Monday, Wednesday, and Friday.  03/12/20  Yes [provider]  amoxicillin (AMOXIL) 875 MG tablet Take 1 tablet (875 mg total) by mouth 2 (two) times daily. Patient not taking: Reported on 04/02/2020 09/15/19   Jaynee Eagles, PA-C  calcium acetate (PHOSLO) 667 MG capsule Take 2 capsules (1,334 mg total) by mouth 3 (three) times daily with meals. Patient not taking: Reported on 04/02/2020 09/19/16   Verlee Monte, MD  cetirizine (ZYRTEC ALLERGY) 10 MG tablet Take 1 tablet (10 mg total) by mouth daily. Patient not taking: Reported on 04/02/2020 09/15/19   Jaynee Eagles, PA-C  fluticasone [facility]) 50 MCG/ACT  nasal spray Place 2 sprays into both nostrils daily. Patient not taking: Reported on 04/02/2020 09/15/19   Jaynee Eagles, PA-C  labetalol (NORMODYNE) 300 MG tablet Take 1 tablet (300 mg total) by mouth 2 (two) times daily. Patient not taking: Reported on 04/02/2020 09/29/17   Ladell Pier, MD  lisinopril (PRINIVIL,ZESTRIL) 30 MG tablet Take 1 tablet (30 mg total) by mouth at bedtime. Patient not taking: Reported on 04/02/2020 09/29/17   Ladell Pier, MD    Allergies    Aluminum chloride, Codeine, and Tramadol  Review of Systems   Review of Systems  Constitutional: Negative for chills and fever.  HENT: Negative for ear pain and sore throat.   Eyes: Negative for pain and visual disturbance.  Respiratory: Positive for cough. Negative for shortness of breath.   Cardiovascular: Negative for  chest pain and palpitations.  Gastrointestinal: Positive for vomiting. Negative for abdominal pain.  Genitourinary: Negative for dysuria and hematuria.  Musculoskeletal: Positive for myalgias. Negative for arthralgias and back pain.  Skin: Negative for color change and rash.  Neurological: Negative for seizures and syncope.  All other systems reviewed and are negative.   Physical Exam Updated Vital Signs BP (!) 217/128   Pulse 81   Temp 98.7 F (37.1 C) (Oral)   Resp 17   Ht 5\' 7"  (1.702 m)   Wt 76 kg   SpO2 100%   BMI 26.24 kg/m   Physical Exam Vitals and nursing note reviewed.  Constitutional:      General: She is not in acute distress.    Appearance: She is well-developed.     Comments: Appears uncomfortable  HENT:     Head: Normocephalic and atraumatic.  Eyes:     Conjunctiva/sclera: Conjunctivae normal.  Cardiovascular:     Rate and Rhythm: Normal rate and regular rhythm.     Heart sounds: No murmur heard.   Pulmonary:     Effort: Pulmonary effort is normal. No respiratory distress.     Breath sounds: Normal breath sounds.  Abdominal:     Palpations: Abdomen is soft.      Tenderness: There is abdominal tenderness (mild generalized tenderness). There is no guarding or rebound.  Musculoskeletal:     Cervical back: Neck supple.  Skin:    General: Skin is warm and dry.  Neurological:     General: No focal deficit present.     Mental Status: She is alert and oriented to person, place, and time. Mental status is at baseline.  Psychiatric:        Mood and Affect: Mood normal.        Thought Content: Thought content normal.     ED Results / Procedures / Treatments   Labs (all labs ordered are listed, but only abnormal results are displayed) Labs Reviewed  SARS CORONAVIRUS 2 BY RT PCR ([place], [place]) - Abnormal; Notable for the following components:      Result Value   SARS Coronavirus 2 POSITIVE (*)    All other components within normal limits  CBC WITH DIFFERENTIAL/PLATELET - Abnormal; Notable for the following components:   Hemoglobin 15.3 (*)    All other components within normal limits  BASIC METABOLIC PANEL - Abnormal; Notable for the following components:   CO2 17 (*)    Glucose, Bld 128 (*)    Creatinine, Ser 1.55 (*)    Calcium 10.5 (*)    GFR calc non Af Amer 40 (*)    GFR calc Af Amer 46 (*)    All other components within normal limits  HCG, QUANTITATIVE, PREGNANCY - Abnormal; Notable for the following components:   hCG, Beta Chain, Quant, S 12 (*)    All other components within normal limits  I-STAT BETA HCG BLOOD, ED ([facility], [facility], [facility] ONLY) - Abnormal; Notable for the following components:   I-stat hCG, quantitative 14.6 (*)    All other components within normal limits  CULTURE, BLOOD (ROUTINE X 2)  CULTURE, BLOOD (ROUTINE X 2)  HEPATIC FUNCTION PANEL  URINALYSIS, ROUTINE W REFLEX MICROSCOPIC  CREATININE, URINE, RANDOM  SODIUM, URINE, RANDOM  D-DIMER, QUANTITATIVE (NOT AT [facility])  C-REACTIVE PROTEIN  HIV ANTIBODY (ROUTINE TESTING W REFLEX)  ABO/RH    EKG None  Radiology DG Chest 1  View  Result Date: 04/02/2020 CLINICAL DATA:  Cough and emesis EXAM: CHEST  1 VIEW COMPARISON:  Jan 04, 2017 FINDINGS: Ill-defined airspace opacity noted in the left base. Lungs elsewhere clear. Heart is upper normal in size with pulmonary vascularity normal. No adenopathy. No bone lesions. IMPRESSION: Ill-defined opacity left base region concerning for pneumonia. Lungs elsewhere clear. Heart upper normal in size. Electronically Signed   By: Lowella Grip III M.D.   On: 04/02/2020 08:09    Procedures Procedures (including critical care time)  Medications Ordered in ED Medications  azithromycin (ZITHROMAX) 500 mg in sodium chloride 0.9 % 250 mL IVPB (500 mg Intravenous New Bag/Given 04/02/20 1334)  lactated ringers bolus 500 mL (has no administration in time range)    Followed by  lactated ringers infusion (has no administration in time range)  heparin injection 5,000 Units (has no administration in time range)  methylPREDNISolone sodium succinate (SOLU-MEDROL) 40 mg/mL injection 38 mg (38 mg Intravenous Given 04/02/20 1252)  guaiFENesin-dextromethorphan (ROBITUSSIN DM) 100-10 MG/5ML syrup 10 mL (has no administration in time range)  remdesivir 200 mg in sodium chloride 0.9% 250 mL IVPB (0 mg Intravenous Stopped 04/02/20 1319)    Followed by  remdesivir 100 mg in sodium chloride 0.9 % 100 mL IVPB (has no administration in time range)  senna-docusate (Senokot-S) tablet 1 tablet (has no administration in time range)  amLODipine (NORVASC) tablet 10 mg (has no administration in time range)  Tacrolimus ER TB24 6 mg (has no administration in time range)  pantoprazole (PROTONIX) EC tablet 40 mg (has no administration in time range)  labetalol (NORMODYNE) tablet 300 mg (has no administration in time range)  ondansetron (ZOFRAN) injection 4 mg (has no administration in time range)  nitroGLYCERIN 50 mg in dextrose 5 % 250 mL (0.2 mg/mL) infusion (20 mcg/min Intravenous Rate/Dose Change 04/02/20 1326)   ondansetron (ZOFRAN-ODT) disintegrating tablet 4 mg (4 mg Oral Given 04/02/20 0441)  lactated ringers bolus 1,000 mL (0 mLs Intravenous Stopped 04/02/20 1238)  ondansetron (ZOFRAN-ODT) disintegrating tablet 4 mg (4 mg Oral Given 04/02/20 0738)  labetalol (NORMODYNE) injection 20 mg (20 mg Intravenous Given 04/02/20 0940)  ondansetron (ZOFRAN) injection 4 mg (4 mg Intravenous Given 04/02/20 0938)  cefTRIAXone (ROCEPHIN) 1 g in sodium chloride 0.9 % 100 mL IVPB (0 g Intravenous Stopped 04/02/20 1114)  HYDROmorphone (DILAUDID) injection 1 mg (1 mg Intravenous Given 04/02/20 1320)    ED Course  I have reviewed the triage vital signs and the nursing notes.  Pertinent labs & imaging results that were available during my care of the patient were reviewed by me and considered in my medical decision making (see chart for details).    MDM Rules/Calculators/A&P                          44 year old female presents with intractable vomiting.  Afebrile.  Blood pressure 213/135.  Pulse high 90s/low 100s.  Remainder vital signs stable.  Exam as above.  Per nursing staff patient intermittently desats while sleeping so patient was placed on 2 L nasal cannula.  This was turned off during my examination of the patient and she remained in the high 90s on room air while awake.  CBC, BMP unremarkable.  I-STAT hCG elevated at 14.6.  Spoke to patient about possible pregnancy however she stated that she had tubal ligations after multiple ectopic pregnancies approximately 3 years ago.  Given this as well as patient's age we will repeat a blood hCG as well as evaluate for Covid  despite patient's vaccine status.  Patient states that she takes Norvasc as well as labetalol at home for her elevated blood pressure however she has been unable to take her blood pressure medication due to intractable nausea and vomiting.  We will treat patient's blood pressure and then reassess once labs have returned.  COVID (+).  CXR showed c/f possible LLL  PNA.  HcG remains minimally elevated at 12 however doubt this represents true pregnancy given low level and h/o tubal ligation.  Pt was given Labetalol w/ minimal improvement in her BP and Zofran had minimal effect on her n/v.  Patient was given a liter fluid upon reassessment patient endorsed continued symptoms.  This time feel patient will require admission given she has been unable to tolerate p.o. intake and she will require antibiotics for her pneumonia as well as for her blood pressure medication and her immunosuppressants for her kidney transplant.  Upon chart review patient has been seen by her physicians in the past for intractable nausea and vomiting which was thought to be due to cannabinoid hyperemesis at that time.  It is possible that this is playing a part of patient's intractable nausea and vomiting today.  Patient was a started on IV Rocephin as well as IV Zithromax for possible bacterial pneumonia on top of Covid pneumonia.  Given inability to tolerate p.o. intake hospitalist was consulted for admission.  Hospitalist evaluated the patient at bedside and agreed with admission to their service.  Patient was admitted to the hospitalist service in stable condition and had no further events during my shift.  Final Clinical Impression(s) / ED Diagnoses Final diagnoses:  None    Rx / DC Orders ED Discharge Orders    None       Silvestre Gunner, MD 04/02/20 1442    Carmin Muskrat, MD 04/03/20 1310

## 2020-04-02 NOTE — ED Notes (Signed)
Pt has had 2 loose stools, is able to use [facility]

## 2020-04-02 NOTE — ED Notes (Signed)
Pt feels too nauseated to take any po meds at the present time

## 2020-04-02 NOTE — ED Notes (Signed)
Patient still c/o 10/10 pain at this time. Pt HR is also 120s. Provider made aware.

## 2020-04-02 NOTE — Progress Notes (Addendum)
Paged for central left back pain 10/10 that was sudden onset around 3 hours ago. She states the pain feels like nerve pain but is in one spot on her left upper back. She also has pain around her left ribs and intermittent tingling of the left hand. She denies chest pain, she has been having some shortness of breath. Nausea is currently resolved but previously she was having lots of nausea and vomiting. She denies much cough.The pain was barely helped by dilaudid earlier and no relief with robaxin.  Left arm BP 200/116, right arm 176/122. She is tachycardic to 114, regular rhythm. ECG done and shows RBBB which was present on previous ECG in 2019 in addition to sinus tachycardia, right atrial enlargement, possible left atrial enlargement and qtc of 501. Lungs are clear to auscultattion. Her pain is reproducible along the anterior left and lateral lower two ribs. There is a small point of tenderness lateral to T8 on her back. Pressing on the area helps relieve the pain slightly, no tonicity or trigger points palpated. She is nontender along the abdominal region. No abdominal bruits. Pulses are equal and intact in the upper extremities.  Overall symptoms appear musculoskeletal as pain is reproducible and likely from severe nausea and vomiting. If symptoms do not improve, will consider CTA.   - Troponin x2  - continue blood pressure control, nitro gtt, norvasc 10 mg. She just received pm dose of labetalol 300 mg - repeat BMP   - dilaudid 1mg  q4h IV prn, K pad, ice prn, voltaren gel - increase fluids to 250 cc/hr and repeat BMP in case of CTA.    James Senn A, DO 04/02/2020, 10:10 PM Pager: [phone]

## 2020-04-02 NOTE — ED Notes (Signed)
Pt is a difficult stick- has had to be stuck multiple times for labs, 1 set of cultures done after antibx given- this nurse asked phlebotomy to wait to get 2nd set with the rest of labs.

## 2020-04-02 NOTE — H&P (Addendum)
Date: 04/02/2020               Patient Name:  Melody [name] MRN: [id number]  DOB: Dec 13, 1975 Age / Sex: 44 y.o., female   PCP: Ladell Pier, MD         Medical Service: Internal Medicine Teaching Service         Attending Physician: Dr. Jimmye Norman    First Contact: Dr. 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   She states she was in her usual state of health until 1 to 2 days ago when she began experiencing myalgia, generalized weakness and subsequently nausea, nonbloody nonbilious emesis, diarrhea. She received both Pfizer vaccinations in March. She does have 2 children at home aged 66 and 67 who are sick. She states the only time she experiences shortness of breath is when she goes to sleep. Since her onset of symptoms, she has not been able to tolerate any p.o. intake and has thus not taking her immunosuppressants as well as her hypertension medications. Otherwise she denies fevers, chills, abdominal pain, lower extremity weakness, dizziness, lightheadedness.  In the emergency department, she was found to be tachycardic to 137, tachypneic to 22, hypertensive with range 190s-220s/120s-130s.  She was given IV labetalol 20 mg.  Chest x-ray was concerning for lobar pneumonia and was thus given a one-time dose of ceftriaxone and azithromycin   Lab Orders     SARS Coronavirus 2 by RT PCR (hospital order, performed in [facility] hospital lab) Nasopharyngeal Nasopharyngeal Swab      Blood Culture (routine x 2)     CBC with Differential     Basic metabolic panel     hCG, quantitative, pregnancy     Hepatic function panel     Urinalysis, Routine w reflex microscopic     Creatinine, urine, random     Sodium, urine, random     D-dimer, quantitative     Procalcitonin     Lactate dehydrogenase     Ferritin     Fibrinogen     C-reactive protein     HIV Antibody (routine testing w rflx)     Comprehensive metabolic panel     CBC with Differential/Platelet     C-reactive protein     D-dimer, quantitative (not at [facility])     I-Stat Beta hCG blood, ED ([facility], [facility], [facility] only)   Meds:  Current Meds  Medication Sig  . acetaminophen (TYLENOL) 500 MG tablet Take 1,000 mg by mouth every 6 (six) hours as needed for mild pain or headache.  Marland Kitchen amLODipine (NORVASC) 10 MG tablet Take 1 tablet (10 mg total) by mouth at bedtime.  . cinacalcet (SENSIPAR) 30 MG tablet Take 30 mg by mouth daily.  Marland Kitchen ENVARSUS XR 1 MG TB24 Take 6 mg by mouth daily.   Marland Kitchen labetalol (NORMODYNE) 200 MG tablet Take 200 mg by mouth 2 (two) times daily.  Marland Kitchen lidocaine-prilocaine (EMLA) cream Apply 1 application topically daily as needed for irritation.  . mycophenolate (MYFORTIC) 180 MG EC tablet Take 540 mg by mouth  2 (two) times daily.  Marland Kitchen omeprazole (PRILOSEC) 20 MG capsule Take 20 mg by mouth 2 (two) times daily.  . predniSONE (DELTASONE) 5 MG tablet Take 5 mg by mouth daily.  Marland Kitchen sulfamethoxazole-trimethoprim (BACTRIM) 400-80 MG tablet Take 1 tablet by mouth every Monday, Wednesday, and Friday.      Allergies: Allergies as of 04/02/2020 - Review Complete 04/02/2020  Allergen Reaction Noted  . Aluminum chloride Hives 07/24/2019  . Codeine Other (See Comments) 02/12/2017  . Tramadol Hives 09/25/2016   Past Medical History:  Diagnosis Date  . Alcohol abuse   . Anemia   . Chronic kidney disease    staqge 5 M/W/F Dialysis  . Drug abuse ([place])   . Ectopic pregnancy   . Headache    Migraines  . Hypertension Dx  May 2016  . Tobacco abuse     Family History: Mother and uncle with diabetes and hypertension  Social History: Originally from [place] but has been living in [place] for over 29 years. Owns a cleaning business. Lives with her boyfriend and her children. Endorses marijuana use. She denies cigarette, cocaine, meth use.  Review of Systems: A complete ROS was negative except as per HPI.   Physical Exam: Blood pressure (!) 221/128, pulse 81, temperature 97.7 F (36.5 C), temperature source Oral, resp. rate (!) 21, height 5\' 7"  (1.702 m), weight 76 kg, SpO2 99 %. Physical Exam Vitals and nursing note reviewed.  Constitutional:      General: She is in acute distress (Due to nausea and vomiting).     Appearance: She is not toxic-appearing.  HENT:     Head: Normocephalic and atraumatic.  Eyes:     Conjunctiva/sclera: Conjunctivae normal.  Cardiovascular:     Rate and Rhythm: Normal rate and regular rhythm.     Pulses: Normal pulses.     Heart sounds: Normal heart sounds. No murmur heard.   Pulmonary:     Effort: Pulmonary effort is normal. No respiratory distress.     Breath sounds: Normal breath sounds. No wheezing.  Abdominal:     General: Abdomen is flat.     Tenderness: There is no abdominal tenderness.  Musculoskeletal:        General: No swelling or tenderness.     Cervical back: Neck supple.     Right lower leg: No edema.     Left lower leg: No edema.  Skin:    General: Skin is dry.  Neurological:     Mental Status: She is alert.  Psychiatric:        Mood and Affect: Mood normal.        Behavior: Behavior normal.     EKG: Pending  CXR: personally reviewed my interpretation is unremarkable   Assessment & Plan by Problem: Principal Problem:   Acute respiratory failure with hypoxia (HCC) Active Problems:   Essential hypertension   Marijuana use, continuous   Intractable nausea and vomiting   Melody [name] is a 44 year old African-American woman with medical  history significant for CKD/ESRD status post renal transplant on chronic immunosuppressants, hypertension, prior history of cannabis hyperemesis syndrome here for management of hypoxic respiratory failure secondary to COVID-19 as well as intractable nausea and vomiting   #Acute hypoxic respiratory failure secondary to COVID-19 pneumonia -Start remdesivir (day 1/5) -IV Solu-Medrol (day 1/10) -Follow-up daily CRP, D-dimer -Supplemental oxygen as needed -Flutter valve, IS   #Intractable nausea and vomiting (DDx COVID-19 syndrome vs canncabis hyperemesis syndrome) -Zofran 4 mg q4hr as needed nausea and  vomiting   #Hypertension-uncontrolled  Unable to tolerate p.o. antihypertensives due to nausea and vomiting.  Status post labetalol 20 mg IV. BP did not improve with IV labetalol pushes.  -Home hypertensives include amlodipine 10 mg daily, labetalol 300mg  twice daily, lisinopril 30 mg daily -Resume amlodipine 10 mg daily, labetalol 300 mg daily -On IV Nitroglycerin infusion with goal SBP 170 -Hold lisinopril in the setting of AKI   #Acute kidney injury  #NAGMA Her last serum creatinine was 0.9 in June 2021.  Serum creatinine this admission is 1.55 -Follow-up urine creatinine, urine sodium, urinalysis -IVF resuscitation with LR   #CKD status post renal transplant 02/18/2019 She is on chronic immunosuppression with tacrolimus 6 mg daily and Myfortic 540 mg twice daily. Reached out to nephrology who agreed with holding myfortic and continuing Tacrolimus  -Continue tacrolimus 6 mg daily -Hold Myfortic 540 mg twice daily   #Elevated beta hCG I-STAT beta-hCG level was 14.6.  She states she has had a tubal ligation -Follow-up quantitative hCG   FEN: Heart healthy diet VTE ppx: Subcutaneous heparin CODE STATUS: Full code   Prior to Admission Living Arrangement: Home Anticipated Discharge Location: Home Barriers to Discharge: Treatment of acute hypoxic respiratory failure   Dispo:  Admit patient to Inpatient with expected length of stay greater than 2 midnights.  Signed: Jean Rosenthal, MD 04/02/2020, 10:40 AM  Pager: [phone] Internal Medicine Teaching Service After 5pm on weekdays and 1pm on weekends: On Call pager: [phone]

## 2020-04-02 NOTE — ED Notes (Signed)
Pt placed on 2 liters O2 n/c sats down to 86% when resting

## 2020-04-02 NOTE — ED Triage Notes (Addendum)
Patient reports emesis this evening and gen. body aches 2 days ago , no fever or chills . Vomitting at arrival.Tachycardic/Hypertensive at triage .

## 2020-04-02 NOTE — ED Notes (Signed)
Pt ambulated to restroom with significant instability, constantly grabbing for objects for support, and stated she was fine and did not need help

## 2020-04-02 NOTE — ED Notes (Signed)
Dinner ordered 

## 2020-04-02 NOTE — ED Notes (Signed)
O2 on at 2L/m/Melody [name]- pt states when she falls asleep she is not able to breath-- does wake up gasping. MD aware

## 2020-04-02 NOTE — ED Notes (Signed)
Lunch Tray Ordered @ 1048. °

## 2020-04-03 ENCOUNTER — Observation Stay (HOSPITAL_COMMUNITY): Payer: Medicare Other

## 2020-04-03 DIAGNOSIS — T8619 Other complication of kidney transplant: Secondary | ICD-10-CM | POA: Diagnosis present

## 2020-04-03 DIAGNOSIS — I10 Essential (primary) hypertension: Secondary | ICD-10-CM

## 2020-04-03 DIAGNOSIS — J9601 Acute respiratory failure with hypoxia: Secondary | ICD-10-CM | POA: Diagnosis present

## 2020-04-03 DIAGNOSIS — D84821 Immunodeficiency due to drugs: Secondary | ICD-10-CM | POA: Diagnosis present

## 2020-04-03 DIAGNOSIS — Z87891 Personal history of nicotine dependence: Secondary | ICD-10-CM | POA: Diagnosis not present

## 2020-04-03 DIAGNOSIS — N189 Chronic kidney disease, unspecified: Secondary | ICD-10-CM

## 2020-04-03 DIAGNOSIS — U071 COVID-19: Secondary | ICD-10-CM | POA: Diagnosis present

## 2020-04-03 DIAGNOSIS — J1282 Pneumonia due to coronavirus disease 2019: Secondary | ICD-10-CM | POA: Diagnosis present

## 2020-04-03 DIAGNOSIS — Z833 Family history of diabetes mellitus: Secondary | ICD-10-CM | POA: Diagnosis not present

## 2020-04-03 DIAGNOSIS — M545 Low back pain: Secondary | ICD-10-CM | POA: Diagnosis not present

## 2020-04-03 DIAGNOSIS — Z885 Allergy status to narcotic agent status: Secondary | ICD-10-CM | POA: Diagnosis not present

## 2020-04-03 DIAGNOSIS — Z8249 Family history of ischemic heart disease and other diseases of the circulatory system: Secondary | ICD-10-CM | POA: Diagnosis not present

## 2020-04-03 DIAGNOSIS — R1110 Vomiting, unspecified: Secondary | ICD-10-CM | POA: Diagnosis not present

## 2020-04-03 DIAGNOSIS — Z888 Allergy status to other drugs, medicaments and biological substances status: Secondary | ICD-10-CM | POA: Diagnosis not present

## 2020-04-03 DIAGNOSIS — F12988 Cannabis use, unspecified with other cannabis-induced disorder: Secondary | ICD-10-CM

## 2020-04-03 DIAGNOSIS — Z803 Family history of malignant neoplasm of breast: Secondary | ICD-10-CM | POA: Diagnosis not present

## 2020-04-03 DIAGNOSIS — R112 Nausea with vomiting, unspecified: Secondary | ICD-10-CM

## 2020-04-03 DIAGNOSIS — Z841 Family history of disorders of kidney and ureter: Secondary | ICD-10-CM | POA: Diagnosis not present

## 2020-04-03 DIAGNOSIS — E872 Acidosis: Secondary | ICD-10-CM | POA: Diagnosis present

## 2020-04-03 DIAGNOSIS — Z7952 Long term (current) use of systemic steroids: Secondary | ICD-10-CM | POA: Diagnosis not present

## 2020-04-03 DIAGNOSIS — Z940 Kidney transplant status: Secondary | ICD-10-CM

## 2020-04-03 DIAGNOSIS — N179 Acute kidney failure, unspecified: Secondary | ICD-10-CM

## 2020-04-03 DIAGNOSIS — Y830 Surgical operation with transplant of whole organ as the cause of abnormal reaction of the patient, or of later complication, without mention of misadventure at the time of the procedure: Secondary | ICD-10-CM | POA: Diagnosis present

## 2020-04-03 DIAGNOSIS — Z79899 Other long term (current) drug therapy: Secondary | ICD-10-CM | POA: Diagnosis not present

## 2020-04-03 LAB — COMPREHENSIVE METABOLIC PANEL
ALT: 27 U/L (ref 0–44)
AST: 25 U/L (ref 15–41)
Albumin: 3.6 g/dL (ref 3.5–5.0)
Alkaline Phosphatase: 45 U/L (ref 38–126)
Anion gap: 13 (ref 5–15)
BUN: 22 mg/dL — ABNORMAL HIGH (ref 6–20)
CO2: 20 mmol/L — ABNORMAL LOW (ref 22–32)
Calcium: 9.6 mg/dL (ref 8.9–10.3)
Chloride: 103 mmol/L (ref 98–111)
Creatinine, Ser: 1.45 mg/dL — ABNORMAL HIGH (ref 0.44–1.00)
GFR calc Af Amer: 50 mL/min — ABNORMAL LOW (ref >60)
GFR calc non Af Amer: 43 mL/min — ABNORMAL LOW (ref >60)
Glucose, Bld: 144 mg/dL — ABNORMAL HIGH (ref 70–99)
Potassium: 4 mmol/L (ref 3.5–5.1)
Sodium: 136 mmol/L (ref 135–145)
Total Bilirubin: 0.6 mg/dL (ref 0.3–1.2)
Total Protein: 6.4 g/dL — ABNORMAL LOW (ref 6.5–8.1)

## 2020-04-03 LAB — BASIC METABOLIC PANEL
Anion gap: 16 — ABNORMAL HIGH (ref 5–15)
BUN: 20 mg/dL (ref 6–20)
CO2: 17 mmol/L — ABNORMAL LOW (ref 22–32)
Calcium: 9.5 mg/dL (ref 8.9–10.3)
Chloride: 102 mmol/L (ref 98–111)
Creatinine, Ser: 1.29 mg/dL — ABNORMAL HIGH (ref 0.44–1.00)
GFR calc Af Amer: 58 mL/min — ABNORMAL LOW (ref >60)
GFR calc non Af Amer: 50 mL/min — ABNORMAL LOW (ref >60)
Glucose, Bld: 134 mg/dL — ABNORMAL HIGH (ref 70–99)
Potassium: 4.2 mmol/L (ref 3.5–5.1)
Sodium: 135 mmol/L (ref 135–145)

## 2020-04-03 LAB — CBC WITH DIFFERENTIAL/PLATELET
Abs Immature Granulocytes: 0.03 10*3/uL (ref 0.00–0.07)
Basophils Absolute: 0 10*3/uL (ref 0.0–0.1)
Basophils Relative: 0 %
Eosinophils Absolute: 0 10*3/uL (ref 0.0–0.5)
Eosinophils Relative: 0 %
HCT: 38.5 % (ref 36.0–46.0)
Hemoglobin: 12.8 g/dL (ref 12.0–15.0)
Immature Granulocytes: 0 %
Lymphocytes Relative: 6 %
Lymphs Abs: 0.5 10*3/uL — ABNORMAL LOW (ref 0.7–4.0)
MCH: 31.1 pg (ref 26.0–34.0)
MCHC: 33.2 g/dL (ref 30.0–36.0)
MCV: 93.7 fL (ref 80.0–100.0)
Monocytes Absolute: 0.4 10*3/uL (ref 0.1–1.0)
Monocytes Relative: 5 %
Neutro Abs: 6.6 10*3/uL (ref 1.7–7.7)
Neutrophils Relative %: 89 %
Platelets: 100 10*3/uL — ABNORMAL LOW (ref 150–400)
RBC: 4.11 MIL/uL (ref 3.87–5.11)
RDW: 13.2 % (ref 11.5–15.5)
WBC: 7.5 10*3/uL (ref 4.0–10.5)
nRBC: 0 % (ref 0.0–0.2)

## 2020-04-03 LAB — C-REACTIVE PROTEIN: CRP: 0.6 mg/dL (ref <1.0)

## 2020-04-03 LAB — CBG MONITORING, ED: Glucose-Capillary: 145 mg/dL — ABNORMAL HIGH (ref 70–99)

## 2020-04-03 LAB — D-DIMER, QUANTITATIVE: D-Dimer, Quant: 1.17 ug/mL-FEU — ABNORMAL HIGH (ref 0.00–0.50)

## 2020-04-03 MED ORDER — ENOXAPARIN SODIUM 40 MG/0.4ML ~~LOC~~ SOLN
40.0000 mg | SUBCUTANEOUS | Status: DC
  Administered 2020-04-03 – 2020-04-06 (×4): 40 mg via SUBCUTANEOUS
  Filled 2020-04-03 (×4): qty 0.4

## 2020-04-03 MED ORDER — METOPROLOL TARTRATE 5 MG/5ML IV SOLN
5.0000 mg | Freq: Once | INTRAVENOUS | Status: AC
  Administered 2020-04-03: 5 mg via INTRAVENOUS
  Filled 2020-04-03: qty 5

## 2020-04-03 MED ORDER — HYDROMORPHONE HCL 1 MG/ML IJ SOLN
1.0000 mg | Freq: Four times a day (QID) | INTRAMUSCULAR | Status: DC | PRN
  Administered 2020-04-03 – 2020-04-05 (×4): 1 mg via INTRAVENOUS
  Filled 2020-04-03 (×4): qty 1

## 2020-04-03 MED ORDER — PROMETHAZINE HCL 25 MG/ML IJ SOLN
25.0000 mg | INTRAMUSCULAR | Status: DC | PRN
  Administered 2020-04-03 – 2020-04-04 (×2): 25 mg via INTRAVENOUS
  Filled 2020-04-03 (×2): qty 1

## 2020-04-03 MED ORDER — METOCLOPRAMIDE HCL 5 MG/ML IJ SOLN
5.0000 mg | Freq: Four times a day (QID) | INTRAMUSCULAR | Status: DC | PRN
  Administered 2020-04-03: 5 mg via INTRAVENOUS
  Filled 2020-04-03: qty 2

## 2020-04-03 MED ORDER — ONDANSETRON HCL 4 MG/2ML IJ SOLN
4.0000 mg | Freq: Once | INTRAMUSCULAR | Status: AC
  Administered 2020-04-03: 4 mg via INTRAVENOUS

## 2020-04-03 MED ORDER — NITROGLYCERIN IN D5W 200-5 MCG/ML-% IV SOLN
0.0000 ug/min | INTRAVENOUS | Status: DC
  Administered 2020-04-03: 15 ug/min via INTRAVENOUS
  Filled 2020-04-03: qty 250

## 2020-04-03 MED ORDER — CHLORHEXIDINE GLUCONATE CLOTH 2 % EX PADS
6.0000 | MEDICATED_PAD | Freq: Every day | CUTANEOUS | Status: DC
  Administered 2020-04-04 – 2020-04-05 (×2): 6 via TOPICAL

## 2020-04-03 MED ORDER — ENOXAPARIN SODIUM 30 MG/0.3ML ~~LOC~~ SOLN: 30.0000 mg | SUBCUTANEOUS | Status: DC

## 2020-04-03 NOTE — ED Notes (Signed)
Diaphoretic and shaky-- CBg 145. Attempting to rest at present

## 2020-04-03 NOTE — Progress Notes (Addendum)
Appointments cancelled as patient is now hospitalized    Patient scheduled for outpatient Remdesivir infusion at 2pm, Wednesday 8/11, Thursday 8/12, and Friday 8/13 at [facility]. Please inform the patient to park at [place], as staff will be escorting the patient through the [place] entrance of the hospital.    There is a wave flag banner located near the entrance on [address]. Turn into this entrance and immediately turn left and park in 1 of the 5 designated Covid Infusion Parking spots. There is a phone number on the sign, please call and let the staff know what spot you are in and we will come out and get you. For questions call [phone].  Thanks.  * If patient is getting dropped off, you may have your ride pull up to the main entrance of [facility] hospital. Please stay in the car, call us at [phone] and let us know what car you're in. Staff will come out to get you.

## 2020-04-03 NOTE — Care Management Obs Status (Signed)
MEDICARE OBSERVATION STATUS NOTIFICATION   Patient Details  Name: Melody [name] MRN: [id number] Date of Birth: November 03, 1975   Medicare Observation Status Notification Given:  Yes    Fuller Mandril, RN 04/03/2020, 2:23 PM

## 2020-04-03 NOTE — ED Notes (Signed)
Continues to vomit and have dry heaves. BP 181/ 119. MD notified

## 2020-04-03 NOTE — ED Notes (Signed)
Report attempted x1, was told charge RN had not approved/assigned bed yet. Will call back in approx. 55min

## 2020-04-03 NOTE — Care Management CC44 (Signed)
Condition Code 44 Documentation Completed  Patient Details  Name: Melody [name] MRN: [id number] Date of Birth: 1976/05/12   Condition Code 44 given:  Yes Patient signature on Condition Code 44 notice:  Yes Documentation of 2 MD's agreement:  Yes Code 44 added to claim:  Yes    Fuller Mandril, RN 04/03/2020, 2:23 PM

## 2020-04-03 NOTE — ED Notes (Signed)
Pt is nauseated, gagging, having dry heaves

## 2020-04-03 NOTE — Care Management Obs Status (Deleted)
MEDICARE OBSERVATION STATUS NOTIFICATION   Patient Details  Name: Melody [name] MRN: [id number] Date of Birth: July 18, 1976   Medicare Observation Status Notification Given:       Fuller Mandril, RN 04/03/2020, 2:21 PM

## 2020-04-03 NOTE — ED Notes (Signed)
Remains nauseated with dry heaves with any movement

## 2020-04-03 NOTE — Progress Notes (Signed)
Subjective:   Overnight, the team was paged for patient's complaint of central L back pain of 3 hours duration. Nausea noted to have improved significantly at that time. See the resident note from 8/9 at 9:40pm for more details. Troponins unremarkable. BMP demonstrated improvement in Cr since admission (1.45 from 1.55). Pain seemed to be MSK in nature as it was reproducible and improved with massage.  Patient evaluated at bedside this morning. Nausea and vomiting have significantly improved, but still with measly appetite. She was able to take medication by mouth last night. Also endorses productive cough with clear sputum, nasal congestion. No abdominal pain. Back pain from last night has resolved unless palpated. She denies any difficulty breathing and is doing well off of oxygen.  We discussed that we would like to get her home to continue recovery after we discuss plan for immunosuppressants with nephrologist.   Received page from nurse ~2pm that the patient was having persistent nausea/vomiting despite Zofran administration as well as pressures in the 180s/110s. Patient evaluated at bedside. Reported continued nausea, and dry heaved several times. Endorses slight headache however denied chest pain, SOB, or blurry vision. Endorses generalized abdominal pain, tenderness to palpation of L side of abdomen. Bowel sounds decreased. Patient reported she had not had diarrhea or BM since the morning prior. KUB ordered to evaluate for possible ileus, found to be unremarkable. 5 mg IV metoprolol administered with minimal improvement in pressures. Called the patient's transplant service at [facility] who reported the patient has historically had good response with Reglan and promethazine for previous episodes of intractable vomiting. Pressures seemed to improve at first with administration of Reglan. Nausea persistent, so promethazine given. Pressures still elevated, so patient was restarted on nitroglycerin gtt.    Objective:  Vital signs in last 24 hours: Vitals:   04/03/20 0615 04/03/20 0630 04/03/20 0645 04/03/20 0700  BP: 119/86 115/84 (!) 135/95 (!) 131/91  Pulse:      Resp: 15 14 16 17   Temp:      TempSrc:      SpO2:      Weight:      Height:       Physical Exam: Constitutional: (AM) Well-appearing woman sitting upright in bed, in no acute distress --> (afternoon) tired-, uncomfortable-, and anxious-appearing woman laying in bed, dry heaves during exam Cardiovascular: regular rate and rhythm, no m/r/g Pulmonary/Chest: normal work of breathing, clear lungs Abdominal: soft, non-tender, non-distended --> (afternoon) soft, LLQ tenderness, slightly distended, hypoactive bowel sounds MSK: Pain is reproducible along the anterior left and lateral lower two ribs. There is a small point of tenderness lateral to T8 on her back. No overlying skin changes.  Assessment/Plan:  Principal Problem:   Acute respiratory failure with hypoxia (HCC) Active Problems:   Essential hypertension   Marijuana use, continuous   Intractable nausea and vomiting   Acute hypoxemic respiratory failure due to COVID-19 [facility])  Melody [name] is a 44 year old African-American woman with medical history significant for CKD/ESRD status post renal transplant (June 2020) on chronic immunosuppressants, HTN, presumed cannabis hyperemesis syndrome here for management of intractable nausea and vomiting, uncontrolled hypertension, and COVID-19.  COVID-19 positive Received Pfizer vaccinations in March 2020. Onset of myalgias, weakness, nausea/vomiting, and diarrhea ~3 days ago. Two children at home with cold-like symptoms. Patient tested positive 1 day ago on the day of admission (8/9). Endorses shortness of breath lying down and mild cough, intermittent 2L oxygen requirement.  Given sick contacts, suspect N/V/diarrhea primarily secondary to [place], however  given her history of admissions for similar GI symptoms requiring antiemetics  and reported chronic marijuana use, cannabinoid hyperemesis syndrome may be contributing.  - Remdesivir (day 2/5) - IV Solu-Medrol (day 2/10) with transition to PO when tolerated - Supplemental oxygen as needed - Incentive spirometry - guaifenesin-dextromethorphan q4h PRN - Management of N/V as below  Intractable nausea and vomiting  Additional history obtained from Care Everywhere.  Admissions for intractable N/V in August and November 2020 for which she was treated with IVF and antiemetics. Chronic marijuana user over the last 20 years. Overnight and today initially had relief of symptoms with IV Zofran alone, however later in the day required Phenergan and Reglan. - Zofran 4 mg IV q4hr PRN - Phenergan 25 mg IV q4h PRN - Reglan 5 mg IV q6h PRN  Hypertension-uncontrolled  Patient's pre-transplant ESRD presumed to be secondary to uncontrolled hypertension. On day of admission required nitro gtt as her pressures did not improve with IV labetolol pushes. Today she was able to take home antihypertensives as she had improvement in her N/V, and her BPs came down. However, later in the day her pressures rose again in the s/o intractable N/V, and she was placed back on the nitro gtt.   - Home hypertensives include amlodipine 10 mg daily, labetalol 300mg  twice daily - On IV Nitroglycerin infusion with goal SBP 170 - Resume amlodipine 10 mg daily, labetalol 300 mg daily when can tolerate PO  Acute kidney injury  Per Care Everywhere, patient's baseline Cr reportedly ~1.0-1.2 as of June 2021. Creatinine on admission 1.55. Likely pre-renal in the setting of dehydration secondary t intractable N/V and poor PO. Initially improved to 1.29 with IVF, however rose slightly to 1.45 this morning. - Continue IVF in setting of persistent N/V - AM BMP  CKD s/p renal transplant June 2020 Patient's pre-transplant ESRD presumed to be secondary to uncontrolled hypertension. She is on chronic immunosuppression with  Envarsus (tacrolimus) 6 mg daily, Myfortic (mycophenolic acid) 903 mg twice daily, and prednisone 5 mg. Found out today that the hospital formulary does not have her Envarsus, so plan is for her fiance to bring this medicine to the hospital for her use. Holding Myfortic in setting of AKI. So far the patient has missed 3 days of her Envarsus. - Fiance to bring home Envarsus (tacrolimus) 6 mg daily - Holding Myfortic 540 mg twice daily  Back pain Overnight had acute onset of L-sided lower back pain which wrapped around to her abdomen. Reproducible with palpation, Described by patient as nerve pain. No overlying skin changes. Minimal relief with Dilaudid. Some improvement with massage and heat. Most likely MSK in nature, however given the patient's immunosuppressed status, shingles on the differential. Will continue to monitor.  FEN: Heart healthy diet VTE ppx: Subcutaneous heparin   Alexandria Lodge, MD 04/03/2020, 7:03 AM Pager: [phone] After 5pm on weekdays and 1pm on weekends: On Call pager [phone]

## 2020-04-03 NOTE — ED Notes (Signed)
O2 sats dropped to 82-84% when pt was laying on back in supine position- had been side lying- HOB elevated to approx 30 degrees and O2 increased to 4L/m/[place]. MD made aware

## 2020-04-03 NOTE — ED Notes (Signed)
Pt still nauseated, will try reglan as per order. Resting on left side, O2 on at 2L/m/ - sats drop when sleeping to 88%

## 2020-04-03 NOTE — ED Notes (Signed)
Drs at bedside

## 2020-04-03 NOTE — ED Notes (Signed)
2nd dose of zofran given for continued vomiting

## 2020-04-03 NOTE — ED Notes (Signed)
Lunch Tray Ordered @ 1048. °

## 2020-04-04 ENCOUNTER — Encounter (HOSPITAL_COMMUNITY): Payer: Self-pay | Admitting: Internal Medicine

## 2020-04-04 ENCOUNTER — Ambulatory Visit (HOSPITAL_COMMUNITY): Payer: No Typology Code available for payment source

## 2020-04-04 LAB — COMPREHENSIVE METABOLIC PANEL
ALT: 25 U/L (ref 0–44)
AST: 24 U/L (ref 15–41)
Albumin: 3.9 g/dL (ref 3.5–5.0)
Alkaline Phosphatase: 50 U/L (ref 38–126)
Anion gap: 14 (ref 5–15)
BUN: 22 mg/dL — ABNORMAL HIGH (ref 6–20)
CO2: 21 mmol/L — ABNORMAL LOW (ref 22–32)
Calcium: 10 mg/dL (ref 8.9–10.3)
Chloride: 101 mmol/L (ref 98–111)
Creatinine, Ser: 1.39 mg/dL — ABNORMAL HIGH (ref 0.44–1.00)
GFR calc Af Amer: 53 mL/min — ABNORMAL LOW (ref >60)
GFR calc non Af Amer: 45 mL/min — ABNORMAL LOW (ref >60)
Glucose, Bld: 135 mg/dL — ABNORMAL HIGH (ref 70–99)
Potassium: 4 mmol/L (ref 3.5–5.1)
Sodium: 136 mmol/L (ref 135–145)
Total Bilirubin: 0.8 mg/dL (ref 0.3–1.2)
Total Protein: 6.8 g/dL (ref 6.5–8.1)

## 2020-04-04 LAB — CBC WITH DIFFERENTIAL/PLATELET
Abs Immature Granulocytes: 0.03 10*3/uL (ref 0.00–0.07)
Basophils Absolute: 0 10*3/uL (ref 0.0–0.1)
Basophils Relative: 0 %
Eosinophils Absolute: 0 10*3/uL (ref 0.0–0.5)
Eosinophils Relative: 0 %
HCT: 40.8 % (ref 36.0–46.0)
Hemoglobin: 13.8 g/dL (ref 12.0–15.0)
Immature Granulocytes: 0 %
Lymphocytes Relative: 5 %
Lymphs Abs: 0.6 10*3/uL — ABNORMAL LOW (ref 0.7–4.0)
MCH: 30.8 pg (ref 26.0–34.0)
MCHC: 33.8 g/dL (ref 30.0–36.0)
MCV: 91.1 fL (ref 80.0–100.0)
Monocytes Absolute: 0.5 10*3/uL (ref 0.1–1.0)
Monocytes Relative: 4 %
Neutro Abs: 10.1 10*3/uL — ABNORMAL HIGH (ref 1.7–7.7)
Neutrophils Relative %: 91 %
Platelets: 111 10*3/uL — ABNORMAL LOW (ref 150–400)
RBC: 4.48 MIL/uL (ref 3.87–5.11)
RDW: 13.2 % (ref 11.5–15.5)
WBC: 11.2 10*3/uL — ABNORMAL HIGH (ref 4.0–10.5)
nRBC: 0 % (ref 0.0–0.2)

## 2020-04-04 LAB — D-DIMER, QUANTITATIVE: D-Dimer, Quant: 0.77 ug/mL-FEU — ABNORMAL HIGH (ref 0.00–0.50)

## 2020-04-04 LAB — C-REACTIVE PROTEIN: CRP: <0.5 mg/dL (ref <1.0)

## 2020-04-04 NOTE — Progress Notes (Signed)
Called down to ER to locate patients medications she states she brought in with her. ER can not locate meds. Messaged pharmacy about the medications. Pharmacy states they do not have anything comparable to give the patient.

## 2020-04-04 NOTE — Progress Notes (Signed)
RT went to check on patient to see if she is ready to go on CPAP she refused and said she does not wear it at home. RT will continue to monitor.

## 2020-04-04 NOTE — Plan of Care (Signed)

## 2020-04-04 NOTE — Progress Notes (Signed)
Subjective:   Early in the evening yesterday, blood pressures remained high despite NTG gtt however normalized with Dilaudid PRN. Pressures spiked again after the patient refused her evening medications, got her PO labetolol at 1am, and her pressures came down again. No complaints of N/V at that time. Patient also had oxygen requirement when on back in supine position.  Patient evaluated at bedside this morning. Reports she is feeling much better compared to yesterday and overnight. Denies headache, dizziness, blurry vision. Endorses mild left-sided abdominal pain, same as yesterday evening. No N/V since yesterday evening or any back pain. Reports that her fiance brought her Envarsus to the hospital last night, however she has not taken it yet. Discussed that we would like to monitor the patient for another day to ensure that her N/V are managed and her blood pressures are stable, and she voiced understanding.  Objective:  Vital signs in last 24 hours: Vitals:   04/04/20 0400 04/04/20 0745 04/04/20 1211 04/04/20 1300  BP: 111/83 (!) 134/95 (!) 138/102 (!) 142/98  Pulse: 65 64 72 65  Resp: 12 12 14 15   Temp: 98 F (36.7 C) 97.7 F (36.5 C) 98.5 F (36.9 C) 98.8 F (37.1 C)  TempSrc: Oral Oral Oral Oral  SpO2: 99% 99% 98% 96%  Weight:      Height:       Physical Exam: Constitutional: (AM) Well- though tired-appearing woman sitting upright in bed, in no acute distress; smiley and chatty Cardiovascular: regular rate and rhythm, no m/r/g Pulmonary/Chest: normal work of breathing, clear lungs; oxygen turned from 4L to 2L while in the room and she maintained oxygen saturations >90% Abdominal: soft, non-tender, non-distended  Assessment/Plan:  Principal Problem:   Acute respiratory failure with hypoxia (HCC) Active Problems:   Essential hypertension   Marijuana use, continuous   Intractable nausea and vomiting   Acute hypoxemic respiratory failure due to COVID-19 [facility])    COVID-19  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 This is hospital day #2.  COVID-19 positive Vaccinated. Symptomatic for 1-2 days prior to admission. Tested positive on day of admission (8/9). Endorses mild cough, intermittent 2-4L oxygen requirement. N/V improved with anti-emetics as below. Given sick contacts, suspect N/V/diarrhea primarily secondary to [place], however given her history of admissions for similar GI symptoms requiring antiemetics and reported chronic marijuana use, cannabinoid hyperemesis syndrome likely contributing.  - Remdesivir(day 2/5) - IV Solu-Medrol(day 2/5) with transition to PO when tolerated - Supplemental oxygen as needed - Incentive spirometry - guaifenesin-dextromethorphan q4h PRN - Management of N/V as below  Intractable nausea and vomiting Additional history obtained from [facility]. Chronic marijuana user over the last 20 years with recent admissions (August and November 2020) for similar symptoms. N/V have managed with anti-emetics. No additional N/V since last night. Patient able to take her home meds. Will watch for another day to ensure she does not cycle back into an episode of N/V and uncontrolled HTN. 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 Had initial improvement with nitro gtt on day of admission, however yesterday had to be placed back on drip. Overnight, pressures improved with treatment of reported abdominal pain a/w her N/V. Pressures  have been managed since on her home meds. Will watch for another day to ensure her  pressures are more consistently stable.  - Home hypertensives amlodipine 10 mg daily, labetalol355m twice daily  Acute kidney injury Improving. Per Care Everywhere, patient's baseline Cr reportedly ~1.0-1.2 as of June 2021 with eGFR of 80. Creatinine on admission 1.55. Likely pre-renal in the setting of dehydration secondary t intractable N/V and poor PO. Initially improved to 1.29 with IVF, then 1.45, 1.39 this AM. 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 Sheis on chronic immunosuppression with Envarsus (tacrolimus) 6 mg daily, Myfortic (mycophenolic acid) 5961mg twice daily, and prednisone 5 mg. Found out today that the hospital formulary does not have her Envarsus, so fiance reportedly brought the Envarsus to the hospital yesterday. Unable to track down the prescription or reach the fiance. So far the patient has missed 4 days of her Envarsus. - Fiance to bring home Envarsus (tacrolimus) 6 mg daily - Holding Myfortic 540 mg twice daily  Back pain Overnight on day of admission acute onset of L-sided lower back pain which wrapped around to her abdomen. Reproducible with palpation, Described by patient as nerve pain. No overlying skin changes. Minimal relief with Dilaudid. Some improvement with massage and heat. Most likely MSK in nature, however given the patient's immunosuppressed status, shingles on the differential. Will continue to monitor. Did not recur today.  FEN: Heart healthy diet VTE ppx: Subcutaneous heparin  WAlexandria Lodge MD 04/04/2020, 5:55 PM Pager: [phone] 5pm on weekdays and 1pm on weekends: On Call pager [phone]

## 2020-04-05 ENCOUNTER — Ambulatory Visit (HOSPITAL_COMMUNITY): Payer: No Typology Code available for payment source

## 2020-04-05 LAB — COMPREHENSIVE METABOLIC PANEL
ALT: 22 U/L (ref 0–44)
AST: 19 U/L (ref 15–41)
Albumin: 3.8 g/dL (ref 3.5–5.0)
Alkaline Phosphatase: 49 U/L (ref 38–126)
Anion gap: 10 (ref 5–15)
BUN: 18 mg/dL (ref 6–20)
CO2: 24 mmol/L (ref 22–32)
Calcium: 10.3 mg/dL (ref 8.9–10.3)
Chloride: 102 mmol/L (ref 98–111)
Creatinine, Ser: 1.22 mg/dL — ABNORMAL HIGH (ref 0.44–1.00)
GFR calc Af Amer: >60 mL/min (ref >60)
GFR calc non Af Amer: 53 mL/min — ABNORMAL LOW (ref >60)
Glucose, Bld: 149 mg/dL — ABNORMAL HIGH (ref 70–99)
Potassium: 3.7 mmol/L (ref 3.5–5.1)
Sodium: 136 mmol/L (ref 135–145)
Total Bilirubin: 0.8 mg/dL (ref 0.3–1.2)
Total Protein: 6.7 g/dL (ref 6.5–8.1)

## 2020-04-05 LAB — CBC WITH DIFFERENTIAL/PLATELET
Abs Immature Granulocytes: 0.07 10*3/uL (ref 0.00–0.07)
Basophils Absolute: 0 10*3/uL (ref 0.0–0.1)
Basophils Relative: 0 %
Eosinophils Absolute: 0 10*3/uL (ref 0.0–0.5)
Eosinophils Relative: 0 %
HCT: 42.3 % (ref 36.0–46.0)
Hemoglobin: 14.4 g/dL (ref 12.0–15.0)
Immature Granulocytes: 1 %
Lymphocytes Relative: 6 %
Lymphs Abs: 0.8 10*3/uL (ref 0.7–4.0)
MCH: 31 pg (ref 26.0–34.0)
MCHC: 34 g/dL (ref 30.0–36.0)
MCV: 91 fL (ref 80.0–100.0)
Monocytes Absolute: 0.7 10*3/uL (ref 0.1–1.0)
Monocytes Relative: 6 %
Neutro Abs: 10.8 10*3/uL — ABNORMAL HIGH (ref 1.7–7.7)
Neutrophils Relative %: 87 %
Platelets: 117 10*3/uL — ABNORMAL LOW (ref 150–400)
RBC: 4.65 MIL/uL (ref 3.87–5.11)
RDW: 13 % (ref 11.5–15.5)
WBC: 12.4 10*3/uL — ABNORMAL HIGH (ref 4.0–10.5)
nRBC: 0 % (ref 0.0–0.2)

## 2020-04-05 LAB — TACROLIMUS LEVEL: Tacrolimus (FK506) - LabCorp: 2.7 ng/mL (ref 2.0–20.0)

## 2020-04-05 LAB — C-REACTIVE PROTEIN: CRP: <0.5 mg/dL (ref <1.0)

## 2020-04-05 LAB — GLUCOSE, CAPILLARY: Glucose-Capillary: 147 mg/dL — ABNORMAL HIGH (ref 70–99)

## 2020-04-05 LAB — D-DIMER, QUANTITATIVE: D-Dimer, Quant: 0.49 ug/mL-FEU (ref 0.00–0.50)

## 2020-04-05 MED ORDER — TACROLIMUS 1 MG PO CAPS
3.0000 mg | ORAL_CAPSULE | Freq: Two times a day (BID) | ORAL | Status: DC
  Administered 2020-04-05 – 2020-04-06 (×3): 3 mg via ORAL
  Filled 2020-04-05 (×3): qty 3

## 2020-04-06 ENCOUNTER — Ambulatory Visit (HOSPITAL_COMMUNITY): Payer: No Typology Code available for payment source

## 2020-04-06 LAB — CBC WITH DIFFERENTIAL/PLATELET
Abs Immature Granulocytes: 0.07 10*3/uL (ref 0.00–0.07)
Basophils Absolute: 0 10*3/uL (ref 0.0–0.1)
Basophils Relative: 0 %
Eosinophils Absolute: 0 10*3/uL (ref 0.0–0.5)
Eosinophils Relative: 0 %
HCT: 37.9 % (ref 36.0–46.0)
Hemoglobin: 12.9 g/dL (ref 12.0–15.0)
Immature Granulocytes: 1 %
Lymphocytes Relative: 5 %
Lymphs Abs: 0.5 10*3/uL — ABNORMAL LOW (ref 0.7–4.0)
MCH: 31.2 pg (ref 26.0–34.0)
MCHC: 34 g/dL (ref 30.0–36.0)
MCV: 91.8 fL (ref 80.0–100.0)
Monocytes Absolute: 0.4 10*3/uL (ref 0.1–1.0)
Monocytes Relative: 4 %
Neutro Abs: 9.1 10*3/uL — ABNORMAL HIGH (ref 1.7–7.7)
Neutrophils Relative %: 90 %
Platelets: 125 10*3/uL — ABNORMAL LOW (ref 150–400)
RBC: 4.13 MIL/uL (ref 3.87–5.11)
RDW: 13 % (ref 11.5–15.5)
WBC: 10 10*3/uL (ref 4.0–10.5)
nRBC: 0.2 % (ref 0.0–0.2)

## 2020-04-06 LAB — COMPREHENSIVE METABOLIC PANEL
ALT: 19 U/L (ref 0–44)
AST: 16 U/L (ref 15–41)
Albumin: 3.3 g/dL — ABNORMAL LOW (ref 3.5–5.0)
Alkaline Phosphatase: 43 U/L (ref 38–126)
Anion gap: 10 (ref 5–15)
BUN: 24 mg/dL — ABNORMAL HIGH (ref 6–20)
CO2: 23 mmol/L (ref 22–32)
Calcium: 9.9 mg/dL (ref 8.9–10.3)
Chloride: 100 mmol/L (ref 98–111)
Creatinine, Ser: 1.35 mg/dL — ABNORMAL HIGH (ref 0.44–1.00)
GFR calc Af Amer: 54 mL/min — ABNORMAL LOW (ref >60)
GFR calc non Af Amer: 47 mL/min — ABNORMAL LOW (ref >60)
Glucose, Bld: 189 mg/dL — ABNORMAL HIGH (ref 70–99)
Potassium: 3.6 mmol/L (ref 3.5–5.1)
Sodium: 133 mmol/L — ABNORMAL LOW (ref 135–145)
Total Bilirubin: 0.8 mg/dL (ref 0.3–1.2)
Total Protein: 6 g/dL — ABNORMAL LOW (ref 6.5–8.1)

## 2020-04-06 LAB — C-REACTIVE PROTEIN: CRP: <0.5 mg/dL (ref <1.0)

## 2020-04-06 LAB — D-DIMER, QUANTITATIVE: D-Dimer, Quant: 0.43 ug/mL-FEU (ref 0.00–0.50)

## 2020-04-06 MED ORDER — ONDANSETRON HCL 4 MG PO TABS: 4.0000 mg | ORAL_TABLET | Freq: Three times a day (TID) | ORAL | 0 refills | Status: AC | PRN

## 2020-04-06 MED ORDER — DEXAMETHASONE 6 MG PO TABS
6.0000 mg | ORAL_TABLET | Freq: Every day | ORAL | 0 refills | Status: AC
Start: 2020-04-06 — End: 2020-04-09

## 2020-04-06 NOTE — Progress Notes (Signed)
Patient was discharged home by MD order; discharged instructions  review and give to patient with care notes; IV DIC; skin intact; patient will be escorted to the car by nurse tech via wheelchair.  

## 2020-04-06 NOTE — Progress Notes (Signed)
Per MD verbal order patient was transferred to Med Surg level of care. Tele monitor was D/C

## 2020-04-07 LAB — CULTURE, BLOOD (ROUTINE X 2): Culture: NO GROWTH

## 2020-04-07 NOTE — Discharge Summary (Signed)
Name: Melody [name] MRN: [id number] DOB: 10-30-1975 44 y.o. PCP: Ladell Pier, MD  Date of Admission: 04/02/2020  4:31 AM Date of Discharge: 04/06/2020 Attending Physician: Dr. Dorian Pod  Discharge Diagnosis:  1. COVID-19 2. Intractable nausea and vomiting 3. Essential hypertension 4. Acute kidney injury  Discharge Medications: Allergies as of 04/06/2020      Reactions   Aluminum Chloride Hives   Codeine Other (See Comments)   Tylenol #3 causes nightmares   Tramadol Hives      Medication List    STOP taking these medications   amoxicillin 875 MG tablet Commonly known as: AMOXIL   calcium acetate 667 MG capsule Commonly known as: PHOSLO   cetirizine 10 MG tablet Commonly known as: ZyrTEC Allergy   fluticasone 50 MCG/ACT nasal spray Commonly known as: FLONASE   lisinopril 30 MG tablet Commonly known as: ZESTRIL   predniSONE 5 MG tablet Commonly known as: DELTASONE   sulfamethoxazole-trimethoprim 400-80 MG tablet Commonly known as: BACTRIM     TAKE these medications   acetaminophen 500 MG tablet Commonly known as: TYLENOL Take 1,000 mg by mouth every 6 (six) hours as needed for mild pain or headache.   amLODipine 10 MG tablet Commonly known as: NORVASC Take 1 tablet (10 mg total) by mouth at bedtime.   cinacalcet 30 MG tablet Commonly known as: SENSIPAR Take 30 mg by mouth daily.   dexamethasone 6 MG tablet Commonly known as: DECADRON Take 1 tablet (6 mg total) by mouth daily for 3 days.   Envarsus XR 1 MG Tb24 Generic drug: Tacrolimus ER Take 6 mg by mouth daily.   labetalol 200 MG tablet Commonly known as: NORMODYNE Take 200 mg by mouth 2 (two) times daily. What changed: Another medication with the same name was removed. Continue taking this medication, and follow the directions you see here.   lidocaine-prilocaine cream Commonly known as: EMLA Apply 1 application topically daily as needed for irritation.   mycophenolate  180 MG EC tablet Commonly known as: MYFORTIC Take 540 mg by mouth 2 (two) times daily.   omeprazole 20 MG capsule Commonly known as: PRILOSEC Take 20 mg by mouth 2 (two) times daily.   ondansetron 4 MG tablet Commonly known as: Zofran Take 1 tablet (4 mg total) by mouth every 8 (eight) hours as needed for up to 5 days for nausea or vomiting.       Disposition and follow-up:   [name] was discharged from [facility] in Stable condition.  At the hospital follow up visit please address:  1.    - Intractable nausea and vomiting - Hypertension  2.  Labs / imaging needed at time of follow-up:   - Serum creatinine  3.  Pending labs/ test needing follow-up:   - Tacrolimus level (drawn 8/12 at 2pm)  Follow-up Appointments:   Patient instructed to follow-up with her kidney doctors 10-12 days after discharge.  Hospital Course by problem list:  [name] a 44 year old African-American woman with medical history significant for CKD/ESRD status post renal transplant(June 2020)on chronic immunosuppressants, HTN,presumedcannabis hyperemesis syndrome admitted for management of intractable nausea and vomiting, uncontrolled hypertension, and COVID-19.   COVID-19 Received Pfizer vaccinations in March 2020. Onset of myalgias, weakness, nausea/vomiting, and diarrhea 1-2 days prior to admission.Two children at home with cold-like symptoms. Tested positive 1 day ago on the day of admission (8/9). Endorsed shortness of breath lying down and mild cough, intermittent 2L oxygen requirement.  Given sick  contacts, suspect N/V/diarrhea primarily secondary to [place], however given her history of admissions for similar GI symptoms requiring antiemetics and reported chronic marijuana use, cannabinoid hyperemesis syndrome may be contributing. She was treated with Remdesivir and steroids and had improvement in cough and fatigue and no oxygen requirement at  discharge.  Intractable nausea and vomiting Chronic marijuana user over the last 20 years with recent admissions (August and November 2020) for similar symptoms. During this hospitalization, the patient seemed to cycle between periods of N/V, abdominal pain, and HTN. N/V treated symptomatically with Zofran, Phenergan, and Reglan. Patient able to take her home HTN meds, however missed 4 days of her Envarsus as the hospital pharmacy did not have her medicine on formulary and it took some time for family to bring her Envarsus from home. Envarsus reportedly brought from home could not be found, so patient was treated with tacrolimus on the hospital formulary per her kidney doctors. At discharge, her N/V were resolved, and she was discharged with a small supply of Zofran.  Essential hypertension Patient's pre-transplant ESRD presumed to be secondary to uncontrolled hypertension. Had initial improvement with nitro gtt on day of admission, however later had to be placed back on drip. After coming off of the drip for the second time, her pressures were able to be managed with her home antihypertensives, amlodipine 10 mg daily and labetalol 300 mg twice daily.   Acute kidney injury Per Care Everywhere, patient's baseline Cr reportedly ~1.0-1.2 as of June 2021 with eGFR of 80. Creatinine on admission 1.55. Likely pre-renal in the setting of dehydration secondary to intractable N/V and poor PO. Improved with IVF and PO intake to 1.39 on the day of discharge.   CKD s/prenal transplantJune2020 Patient's pre-transplant ESRD presumed to be secondary to uncontrolled hypertension.Sheis on chronic immunosuppression withEnvarsus (tacrolimus)6 mg daily,Myfortic (mycophenolic GNFA)213 mg twice daily, and prednisone 5 mg.As above, the patient missed 4 days of her Envarsus, however was able to resume thereafter. Her Myfortic was held in the setting of COVID-19 infection. Her prednisone was held as she was receiving  steroids as part of her COVID-19 treatment. She was instructed to resume all of her home meds at discharge and follow-up with her kidney doctors 10-12 days after discharge.   Discharge Vitals:   BP (!) 136/96 (BP Location: Right Arm)   Pulse 72   Temp 98.4 F (36.9 C) (Oral)   Resp 19   Ht 5' 7"  (1.702 m)   Wt 69.1 kg   SpO2 100%   BMI 23.86 kg/m   Pertinent Labs, Studies, and Procedures:   04/03/20 tacrolimus level: 2.7  Discharge Instructions: Discharge Instructions    Diet - low sodium heart healthy   Complete by: As directed    Discharge instructions   Complete by: As directed    [name], it was a pleasure taking care of you. You tested positive for COVID-19. We gave you fluids and anti-nausea medicine. We also started your COVID-19 treatment, which includes an intravenous infusion, Remdesivir, and steroids.   We spoke with the kidney doctors, and they would like for you to resume all of your medicines. We have also sent some of the same anti-nausea medicine to your outpatient pharmacy. If you develop worsening symptoms, please seek medical care.  Please follow up with your kidney doctors in 10-12 days.  Take care.   Discharge instructions   Complete by: As directed    [name], it was a pleasure taking care of you. You tested  positive for COVID-19. We gave you fluids and anti-nausea medicine. We also started your COVID-19 treatment, which includes an intravenous infusion, Remdesivir, and steroids.   We spoke with the kidney doctors, and they would like for you to resume all of your medicines. We have also sent some of the same anti-nausea medicine to your outpatient pharmacy. If you develop worsening symptoms, please seek medical care.  Please follow up with your kidney doctors in 10-12 days.  Take care.   Increase activity slowly   Complete by: As directed       Signed: Alexandria Lodge, MD 04/07/2020, 1:13 PM   Pager: [phone]

## 2020-04-09 ENCOUNTER — Telehealth: Payer: Self-pay

## 2020-04-09 NOTE — Telephone Encounter (Signed)
Transition Care Management Follow-up Telephone Call Date of discharge and from where:  04/06/2020, [facility]   Call placed to patient # [phone], her mother answered and said she was not sure how to get in touch with the patient.  She said that she would ask her son to try to reach her and have her call this CM.  Call placed to # [phone], voicemail not set up, unable to leave a message.   Patient does not have a follow up appointment scheduled with PCP and has not been seen by PCP since 2019.  Need to confirm if she would still like to follow up at [facility].  She is currently followed closely by [facility]

## 2020-04-10 ENCOUNTER — Telehealth: Payer: Self-pay

## 2020-04-10 LAB — TACROLIMUS LEVEL: Tacrolimus (FK506) - LabCorp: <1 ng/mL — ABNORMAL LOW (ref 2.0–20.0)

## 2020-04-10 NOTE — Telephone Encounter (Signed)
Transition Care Management Follow-up Telephone Call  Date of discharge and from where: 04/06/2020, [facility]   How have you been since you were released from the hospital? She said she is feeling " awesome."   Any questions or concerns?  none at this time  She said that she has all of her medications and did not have any questions about the meds. She is planning to continue to follow up closely with her providers at [facility].  She said she will call this clinic when she is ready to schedule an appt.      If their condition worsens, is the pt aware to call PCP or go to the Emergency Dept.?  yes  Was the patient provided with contact information for the PCP's office or ED?  she has the phone number for the clinic  Was to pt encouraged to call back with questions or concerns?  yes

## 2020-04-17 DIAGNOSIS — I10 Essential (primary) hypertension: Secondary | ICD-10-CM | POA: Diagnosis not present

## 2020-04-17 DIAGNOSIS — D84821 Immunodeficiency due to drugs: Secondary | ICD-10-CM | POA: Diagnosis not present

## 2020-04-17 DIAGNOSIS — E785 Hyperlipidemia, unspecified: Secondary | ICD-10-CM | POA: Diagnosis not present

## 2020-04-17 DIAGNOSIS — Z792 Long term (current) use of antibiotics: Secondary | ICD-10-CM | POA: Diagnosis not present

## 2020-04-17 DIAGNOSIS — E212 Other hyperparathyroidism: Secondary | ICD-10-CM | POA: Diagnosis not present

## 2020-04-17 DIAGNOSIS — Z940 Kidney transplant status: Secondary | ICD-10-CM | POA: Diagnosis not present

## 2020-04-17 DIAGNOSIS — D849 Immunodeficiency, unspecified: Secondary | ICD-10-CM | POA: Diagnosis not present

## 2020-04-17 DIAGNOSIS — Z79899 Other long term (current) drug therapy: Secondary | ICD-10-CM | POA: Diagnosis not present

## 2020-04-17 DIAGNOSIS — U071 COVID-19: Secondary | ICD-10-CM | POA: Diagnosis not present

## 2020-04-17 DIAGNOSIS — Z4822 Encounter for aftercare following kidney transplant: Secondary | ICD-10-CM | POA: Diagnosis not present

## 2020-06-12 ENCOUNTER — Ambulatory Visit (HOSPITAL_COMMUNITY)
Admission: EM | Admit: 2020-06-12 | Discharge: 2020-06-12 | Disposition: A | Discharge status: Home / Self Care | Payer: Medicare Other | Attending: Family Medicine | Admitting: Family Medicine

## 2020-06-12 ENCOUNTER — Other Ambulatory Visit: Payer: Self-pay | Admitting: Internal Medicine

## 2020-06-12 ENCOUNTER — Other Ambulatory Visit: Payer: Self-pay

## 2020-06-12 ENCOUNTER — Encounter (HOSPITAL_COMMUNITY): Payer: Self-pay | Admitting: Emergency Medicine

## 2020-06-12 DIAGNOSIS — G8929 Other chronic pain: Secondary | ICD-10-CM | POA: Diagnosis not present

## 2020-06-12 DIAGNOSIS — M25512 Pain in left shoulder: Secondary | ICD-10-CM | POA: Diagnosis not present

## 2020-06-12 DIAGNOSIS — I10 Essential (primary) hypertension: Secondary | ICD-10-CM

## 2020-06-12 MED ORDER — AMLODIPINE BESYLATE 10 MG PO TABS: 10.0000 mg | ORAL_TABLET | Freq: Every day | ORAL | 0 refills | Status: DC

## 2020-06-12 MED ORDER — PREDNISONE 20 MG PO TABS: 40.0000 mg | ORAL_TABLET | Freq: Every day | ORAL | 0 refills | Status: DC

## 2020-06-12 MED ORDER — LABETALOL HCL 200 MG PO TABS: 200.0000 mg | ORAL_TABLET | Freq: Two times a day (BID) | ORAL | 1 refills | Status: DC

## 2020-06-12 MED ORDER — DICLOFENAC SODIUM 75 MG PO TBEC
75.0000 mg | DELAYED_RELEASE_TABLET | Freq: Two times a day (BID) | ORAL | 0 refills | Status: DC
Start: 2020-06-12 — End: 2020-08-14

## 2020-06-12 NOTE — Telephone Encounter (Signed)
Medication Refill - Medication: Amlodipine   Has the patient contacted their pharmacy? Yes.   Pt called stating she is completely out of medication and is requesting to have a short supply sent in to pharmacy until her appt on 06/27/20. Please advise.  (Agent: If no, r equest that the patient contact the pharmacy for the refill.) (Agent: If yes, when and what did the pharmacy advise?)  Preferred Pharmacy (with phone number or street name):  [facility] [place], [address] [place]  [place] [address]  Phone: [phone] Fax: [phone]  Hours: Not open 24 hours     Agent: Please be advised that RX refills may take up to 3 business days. We ask that you follow-up with your pharmacy.

## 2020-06-12 NOTE — Discharge Instructions (Signed)
Your blood pressure was noted to be elevated during your visit today. If you are currently taking medication for high blood pressure, please ensure you are taking this as directed. If you do not have a history of high blood pressure and your blood pressure remains persistently elevated, you may need to begin taking a medication at some point. You may return here within the next few days to recheck if unable to see your primary care provider or if do not have a one.  BP (!) 177/99 (BP Location: Right Arm) Comment: repositioned arm  Pulse 63   Temp 98.5 F (36.9 C) (Oral)   Resp 18   SpO2 99%

## 2020-06-12 NOTE — ED Triage Notes (Signed)
Left shoulder pain started around march or April after having seen chiropractor for 3 months.   Pain is worsening.  Bringing left arm above head/above shoulder height feels better to patient.  Patient reports left shoulder pain as sharp, fingers tingle intermittently

## 2020-06-13 NOTE — ED Provider Notes (Signed)
[place]   [id number] 06/12/20 Arrival Time: 7253  ASSESSMENT & PLAN:  1. Chronic left shoulder pain   2. Elevated blood pressure reading in office with diagnosis of hypertension   3. Essential hypertension     No indication for plain imaging of shoulder at this time. No symptoms of hypertensive urgency. Discussed.   Discharge Instructions     Your blood pressure was noted to be elevated during your visit today. If you are currently taking medication for high blood pressure, please ensure you are taking this as directed. If you do not have a history of high blood pressure and your blood pressure remains persistently elevated, you may need to begin taking a medication at some point. You may return here within the next few days to recheck if unable to see your primary care provider or if do not have a one.  BP (!) 177/99 (BP Location: Right Arm) Comment: repositioned arm   Pulse 63    Temp 98.5 F (36.9 C) (Oral)    Resp 18    SpO2 99%       Begin: Meds ordered this encounter  Medications   labetalol (NORMODYNE) 200 MG tablet    Sig: Take 1 tablet (200 mg total) by mouth 2 (two) times daily.    Dispense:  60 tablet    Refill:  1   diclofenac (VOLTAREN) 75 MG EC tablet    Sig: Take 1 tablet (75 mg total) by mouth 2 (two) times daily.    Dispense:  14 tablet    Refill:  0   predniSONE (DELTASONE) 20 MG tablet    Sig: Take 2 tablets (40 mg total) by mouth daily.    Dispense:  10 tablet    Refill:  0     Recommend:  Follow-up Information    Schedule an appointment as soon as possible for a visit  with Ladell Pier, MD.   Specialty: Internal Medicine Why: To recheck your blood pressure. Contact information: [place] [address] [phone]        [place].   Why: If your shoulder pain is worsening or failing to improve. Contact information: [address] [place] [phone]              Reviewed expectations re: course of current medical issues. Questions answered. Outlined signs and symptoms indicating need for more acute intervention. Patient verbalized understanding. After Visit Summary given.  SUBJECTIVE: History from: patient. Melody [name] is a 44 y.o. female who reports intermittent moderate pain of her left shoulder; described as aching; without radiation. Onset: gradual. First noted: two mo ago. Injury/trama: no. Symptoms have gradually worsened since beginning. Aggravating factors: certain movements. Alleviating factors: certain movements. Associated symptoms: none reported. Extremity sensation changes or weakness: none. Self treatment: OTC analgesics without much relief History of similar: no.  Increased blood pressure noted today. Reports that she has been tx for HTN; out of medication.  She reports no chest pain on exertion, no dyspnea on exertion, no swelling of ankles, no orthostatic dizziness or lightheadedness, no orthopnea or paroxysmal nocturnal dyspnea and no palpitations.  Past Surgical History:  Procedure Laterality Date   AV FISTULA PLACEMENT Right 09/15/2016   Procedure: ARTERIOVENOUS (AV) FISTULA CREATION UPPER RIGHT ARM;  Surgeon: Rosetta Posner, MD;  Location: [place];  Service: Vascular;  Laterality: Right;   AV FISTULA PLACEMENT Left 12/30/2016   Procedure:  ARTERIOVENOUS (AV) FISTULA CREATION USING GORETEX STRETCH GRAFT;  Surgeon: Elam Dutch, MD;  Location[facility];  Service: Vascular;  Laterality: Left;   AV FISTULA PLACEMENT Left 06/15/2017   Procedure: INSERTION OF ARTERIOVENOUS (AV) GORE-TEX GRAFT 6MM X 40CM LEFT ARM;  Surgeon: Rosetta Posner, MD;  Location: [place];  Service: Vascular;  Laterality: Left;   AV FISTULA PLACEMENT Right 08/05/2018   Procedure: Insertion of Right arm gortex graft;  Surgeon: Rosetta Posner, MD;  Location: [facility] OR;  Service: Vascular;  Laterality: Right;     DILATION AND CURETTAGE OF UTERUS     INSERTION OF DIALYSIS CATHETER Right 09/15/2016   Procedure: INSERTION OF DIALYSIS CATHETER ON RIGHT SIDE;  Surgeon: Rosetta Posner, MD;  Location: [place];  Service: Vascular;  Laterality: Right;   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 Skin: warm and dry; no visible rashes Neurologic: gait normal; normal sensation and strength of LUE Psychological: alert and cooperative; normal mood and affect    Allergies  Allergen Reactions   Aluminum Chloride Hives   Codeine Other (See Comments)    Tylenol #3 causes nightmares   Tramadol Hives    Past Medical History:  Diagnosis Date   Alcohol abuse    Anemia    Chronic kidney disease    staqge 5 M/W/F Dialysis   Drug abuse ([place])    Ectopic pregnancy    Headache    Migraines   Hypertension Dx May 2016   Tobacco abuse    Social History   Socioeconomic History   Marital status: Significant Other    Spouse name: Not on file   Number of children: 2    Years of education: some college   Highest education level: Not on file  Occupational History   Occupation: Owns Education administrator business  Tobacco Use   Smoking status: Former Smoker    Packs/day: 0.00    Years: 0.00    Pack years: 0.00    Types: Cigars, Cigarettes    Start date: 09/11/2016   Smokeless tobacco: Never Used  Vaping Use   Vaping Use: Never used  Substance  and Sexual Activity   Alcohol use: Yes    Alcohol/week: 0.0 standard drinks    Comment: occasionally   Drug use: Yes    Types: Marijuana   Sexual activity: Yes    Birth control/protection: None  Other Topics Concern   Not on file  Social History Narrative   Looking for work    62 and 13 yr son's.    3 grandkids    Social Determinants of Radio broadcast assistant Strain:    Difficulty of Paying Living Expenses: Not on file  Food Insecurity:    Worried About Charity fundraiser in the Last Year: Not on file   [employer] of Food in the Last Year: Not on file  Transportation Needs:  Lack of Transportation (Medical): Not on file   Lack of Transportation (Non-Medical): Not on file  Physical Activity:    Days of Exercise per Week: Not on file   Minutes of Exercise per Session: Not on file  Stress:    Feeling of Stress : Not on file  Social Connections:    Frequency of Communication with Friends and Family: Not on file   Frequency of Social Gatherings with Friends and Family: Not on file   Attends Religious Services: Not on file   Active Member of Clubs or Organizations: Not on file   Attends Archivist Meetings: Not on file   Marital Status: Not on file   Family History  Problem Relation Age of Onset   CAD Mother    Hypertension Mother    Heart disease Mother    Kidney failure Mother    Cirrhosis Father    CAD Brother    Hypertension Other    Kidney failure Other    Hypertension Maternal Grandmother    Kidney failure Maternal Grandmother    Congestive Heart Failure Maternal Grandmother    Breast cancer Sister    Thyroid disease Neg Hx    Past Surgical History:  Procedure Laterality Date   AV FISTULA PLACEMENT Right 09/15/2016   Procedure: ARTERIOVENOUS (AV) FISTULA CREATION UPPER RIGHT ARM;  Surgeon: Rosetta Posner, MD;  Location: [place];  Service: Vascular;  Laterality: Right;   AV FISTULA PLACEMENT Left 12/30/2016    Procedure: ARTERIOVENOUS (AV) FISTULA CREATION USING GORETEX STRETCH GRAFT;  Surgeon: Elam Dutch, MD;  Location: [facility] OR;  Service: Vascular;  Laterality: Left;   AV FISTULA PLACEMENT Left 06/15/2017   Procedure: INSERTION OF ARTERIOVENOUS (AV) GORE-TEX GRAFT 6MM X 40CM LEFT ARM;  Surgeon: Rosetta Posner, MD;  Location: [place];  Service: Vascular;  Laterality: Left;   AV FISTULA PLACEMENT Right 08/05/2018   Procedure: Insertion of Right arm gortex graft;  Surgeon: Rosetta Posner, MD;  Location: [facility] OR;  Service: Vascular;  Laterality: Right;   DILATION AND CURETTAGE OF UTERUS     INSERTION OF DIALYSIS CATHETER Right 09/15/2016   Procedure: INSERTION OF DIALYSIS CATHETER ON RIGHT SIDE;  Surgeon: Rosetta Posner, MD;  Location: [place];  Service: Vascular;  Laterality: Right;   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

## 2020-06-27 ENCOUNTER — Ambulatory Visit: Payer: Medicare Other | Admitting: Physician Assistant

## 2020-07-30 ENCOUNTER — Ambulatory Visit: Payer: Medicare Other | Admitting: Internal Medicine

## 2020-08-01 ENCOUNTER — Telehealth: Payer: Self-pay | Admitting: Internal Medicine

## 2020-08-01 DIAGNOSIS — D649 Anemia, unspecified: Secondary | ICD-10-CM | POA: Diagnosis not present

## 2020-08-01 DIAGNOSIS — Z940 Kidney transplant status: Secondary | ICD-10-CM | POA: Diagnosis not present

## 2020-08-01 DIAGNOSIS — D849 Immunodeficiency, unspecified: Secondary | ICD-10-CM | POA: Diagnosis not present

## 2020-08-01 DIAGNOSIS — E785 Hyperlipidemia, unspecified: Secondary | ICD-10-CM | POA: Diagnosis not present

## 2020-08-01 DIAGNOSIS — Z79899 Other long term (current) drug therapy: Secondary | ICD-10-CM | POA: Diagnosis not present

## 2020-08-01 DIAGNOSIS — Z87448 Personal history of other diseases of urinary system: Secondary | ICD-10-CM | POA: Diagnosis not present

## 2020-08-01 DIAGNOSIS — Z8619 Personal history of other infectious and parasitic diseases: Secondary | ICD-10-CM | POA: Diagnosis not present

## 2020-08-01 DIAGNOSIS — Z7952 Long term (current) use of systemic steroids: Secondary | ICD-10-CM | POA: Diagnosis not present

## 2020-08-01 DIAGNOSIS — I10 Essential (primary) hypertension: Secondary | ICD-10-CM | POA: Diagnosis not present

## 2020-08-01 DIAGNOSIS — Z862 Personal history of diseases of the blood and blood-forming organs and certain disorders involving the immune mechanism: Secondary | ICD-10-CM | POA: Diagnosis not present

## 2020-08-01 DIAGNOSIS — Z8616 Personal history of COVID-19: Secondary | ICD-10-CM | POA: Diagnosis not present

## 2020-08-01 DIAGNOSIS — B259 Cytomegaloviral disease, unspecified: Secondary | ICD-10-CM | POA: Diagnosis not present

## 2020-08-01 DIAGNOSIS — Z4822 Encounter for aftercare following kidney transplant: Secondary | ICD-10-CM | POA: Diagnosis not present

## 2020-08-01 DIAGNOSIS — R112 Nausea with vomiting, unspecified: Secondary | ICD-10-CM | POA: Diagnosis not present

## 2020-08-01 DIAGNOSIS — D702 Other drug-induced agranulocytosis: Secondary | ICD-10-CM | POA: Diagnosis not present

## 2020-08-01 NOTE — Telephone Encounter (Signed)
Medication Refill - Medication: amLODipine (NORVASC) 10 MG tablet (Patient requesting supply to hold her until upcoming appt. Patient is completely out of medication  Has the patient contacted their pharmacy? yes (Agent: If no, request that the patient contact the pharmacy for the refill.) (Agent: If yes, when and what did the pharmacy advise?)Contact PCP  Preferred Pharmacy (with phone number or street name):  [id number] - [place], [address] [place] Phone:  [phone]  Fax:  [phone]       Agent: Please be advised that RX refills may take up to 3 business days. We ask that you follow-up with your pharmacy.

## 2020-08-01 NOTE — Telephone Encounter (Signed)
It appears this was discontinued by a provider in the ED earlier in October. I'm unsure the reason. Will forward to PCP to see if patient can obtain a supply to last until her upcoming appt.

## 2020-08-01 NOTE — Telephone Encounter (Signed)
Patient informed. 

## 2020-08-01 NOTE — Telephone Encounter (Signed)
Melody [name] may you please reach out to pt

## 2020-08-01 NOTE — Telephone Encounter (Signed)
Patient requesting amlodipine but show that it has been d/c. Please advise.

## 2020-08-08 ENCOUNTER — Emergency Department (HOSPITAL_COMMUNITY): Payer: Medicare Other

## 2020-08-08 ENCOUNTER — Encounter (HOSPITAL_COMMUNITY): Payer: Self-pay

## 2020-08-08 ENCOUNTER — Emergency Department (HOSPITAL_COMMUNITY)
Admission: EM | Admit: 2020-08-08 | Discharge: 2020-08-08 | Disposition: A | Discharge status: Home / Self Care | Payer: Medicare Other | Attending: Emergency Medicine | Admitting: Emergency Medicine

## 2020-08-08 ENCOUNTER — Other Ambulatory Visit: Payer: Self-pay

## 2020-08-08 DIAGNOSIS — S80212A Abrasion, left knee, initial encounter: Secondary | ICD-10-CM | POA: Diagnosis not present

## 2020-08-08 DIAGNOSIS — Z87891 Personal history of nicotine dependence: Secondary | ICD-10-CM | POA: Diagnosis not present

## 2020-08-08 DIAGNOSIS — Z8616 Personal history of COVID-19: Secondary | ICD-10-CM | POA: Diagnosis not present

## 2020-08-08 DIAGNOSIS — Z79899 Other long term (current) drug therapy: Secondary | ICD-10-CM | POA: Insufficient documentation

## 2020-08-08 DIAGNOSIS — S3991XA Unspecified injury of abdomen, initial encounter: Secondary | ICD-10-CM | POA: Diagnosis not present

## 2020-08-08 DIAGNOSIS — S3993XA Unspecified injury of pelvis, initial encounter: Secondary | ICD-10-CM | POA: Diagnosis not present

## 2020-08-08 DIAGNOSIS — M546 Pain in thoracic spine: Secondary | ICD-10-CM | POA: Diagnosis not present

## 2020-08-08 DIAGNOSIS — I10 Essential (primary) hypertension: Secondary | ICD-10-CM | POA: Diagnosis not present

## 2020-08-08 DIAGNOSIS — S80919A Unspecified superficial injury of unspecified knee, initial encounter: Secondary | ICD-10-CM | POA: Diagnosis not present

## 2020-08-08 DIAGNOSIS — R0789 Other chest pain: Secondary | ICD-10-CM | POA: Diagnosis not present

## 2020-08-08 DIAGNOSIS — N186 End stage renal disease: Secondary | ICD-10-CM | POA: Insufficient documentation

## 2020-08-08 DIAGNOSIS — I120 Hypertensive chronic kidney disease with stage 5 chronic kidney disease or end stage renal disease: Secondary | ICD-10-CM | POA: Diagnosis not present

## 2020-08-08 DIAGNOSIS — S299XXA Unspecified injury of thorax, initial encounter: Secondary | ICD-10-CM | POA: Diagnosis not present

## 2020-08-08 DIAGNOSIS — R079 Chest pain, unspecified: Secondary | ICD-10-CM | POA: Diagnosis not present

## 2020-08-08 DIAGNOSIS — R1011 Right upper quadrant pain: Secondary | ICD-10-CM | POA: Insufficient documentation

## 2020-08-08 DIAGNOSIS — Z041 Encounter for examination and observation following transport accident: Secondary | ICD-10-CM | POA: Diagnosis not present

## 2020-08-08 DIAGNOSIS — Y92410 Unspecified street and highway as the place of occurrence of the external cause: Secondary | ICD-10-CM | POA: Diagnosis not present

## 2020-08-08 DIAGNOSIS — S8992XA Unspecified injury of left lower leg, initial encounter: Secondary | ICD-10-CM | POA: Diagnosis present

## 2020-08-08 LAB — COMPREHENSIVE METABOLIC PANEL
ALT: 18 U/L (ref 0–44)
AST: 21 U/L (ref 15–41)
Albumin: 4.4 g/dL (ref 3.5–5.0)
Alkaline Phosphatase: 74 U/L (ref 38–126)
Anion gap: 11 (ref 5–15)
BUN: 15 mg/dL (ref 6–20)
CO2: 22 mmol/L (ref 22–32)
Calcium: 9.8 mg/dL (ref 8.9–10.3)
Chloride: 106 mmol/L (ref 98–111)
Creatinine, Ser: 1.12 mg/dL — ABNORMAL HIGH (ref 0.44–1.00)
GFR, Estimated: >60 mL/min (ref >60)
Glucose, Bld: 87 mg/dL (ref 70–99)
Potassium: 3.8 mmol/L (ref 3.5–5.1)
Sodium: 139 mmol/L (ref 135–145)
Total Bilirubin: 0.4 mg/dL (ref 0.3–1.2)
Total Protein: 7.9 g/dL (ref 6.5–8.1)

## 2020-08-08 LAB — CBC WITH DIFFERENTIAL/PLATELET
Abs Immature Granulocytes: 0.03 10*3/uL (ref 0.00–0.07)
Basophils Absolute: 0.1 10*3/uL (ref 0.0–0.1)
Basophils Relative: 1 %
Eosinophils Absolute: 0.1 10*3/uL (ref 0.0–0.5)
Eosinophils Relative: 1 %
HCT: 44.5 % (ref 36.0–46.0)
Hemoglobin: 14.7 g/dL (ref 12.0–15.0)
Immature Granulocytes: 0 %
Lymphocytes Relative: 13 %
Lymphs Abs: 1.2 10*3/uL (ref 0.7–4.0)
MCH: 31.6 pg (ref 26.0–34.0)
MCHC: 33 g/dL (ref 30.0–36.0)
MCV: 95.7 fL (ref 80.0–100.0)
Monocytes Absolute: 0.6 10*3/uL (ref 0.1–1.0)
Monocytes Relative: 7 %
Neutro Abs: 6.9 10*3/uL (ref 1.7–7.7)
Neutrophils Relative %: 78 %
Platelets: 234 10*3/uL (ref 150–400)
RBC: 4.65 MIL/uL (ref 3.87–5.11)
RDW: 12.5 % (ref 11.5–15.5)
WBC: 8.9 10*3/uL (ref 4.0–10.5)
nRBC: 0 % (ref 0.0–0.2)

## 2020-08-08 LAB — LIPASE, BLOOD: Lipase: 24 U/L (ref 11–51)

## 2020-08-08 LAB — I-STAT BETA HCG BLOOD, ED (MC, WL, AP ONLY): I-stat hCG, quantitative: 15 m[IU]/mL — ABNORMAL HIGH (ref <5)

## 2020-08-08 MED ORDER — IOHEXOL 300 MG/ML  SOLN
75.0000 mL | Freq: Once | INTRAMUSCULAR | Status: AC | PRN
  Administered 2020-08-08: 22:00:00 75 mL via INTRAVENOUS

## 2020-08-08 MED ORDER — HYDROCODONE-ACETAMINOPHEN 5-325 MG PO TABS: 1.0000 | ORAL_TABLET | Freq: Four times a day (QID) | ORAL | 0 refills | Status: DC | PRN

## 2020-08-08 MED ORDER — METHOCARBAMOL 500 MG PO TABS: 500.0000 mg | ORAL_TABLET | Freq: Two times a day (BID) | ORAL | 0 refills | Status: DC

## 2020-08-08 MED ORDER — ONDANSETRON HCL 4 MG/2ML IJ SOLN
4.0000 mg | Freq: Once | INTRAMUSCULAR | Status: AC
  Administered 2020-08-08: 21:00:00 4 mg via INTRAVENOUS
  Filled 2020-08-08: qty 2

## 2020-08-08 MED ORDER — LIDOCAINE 5 % EX PTCH: 1.0000 | MEDICATED_PATCH | CUTANEOUS | 0 refills | Status: DC

## 2020-08-08 MED ORDER — MORPHINE SULFATE (PF) 4 MG/ML IV SOLN
4.0000 mg | Freq: Once | INTRAVENOUS | Status: AC
  Administered 2020-08-08: 21:00:00 4 mg via INTRAVENOUS
  Filled 2020-08-08: qty 1

## 2020-08-08 NOTE — ED Notes (Signed)
Patient transported to XR. 

## 2020-08-08 NOTE — ED Notes (Signed)
Patient transported to CT 
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
## 2020-08-08 NOTE — Discharge Instructions (Addendum)
°  Expect your soreness to increase over the next 2-3 days. Take it easy, but do not lay around too much as this may make any stiffness worse.  Acetaminophen: May take acetaminophen (generic for Tylenol), as needed, for pain. Your daily total maximum amount of acetaminophen from all sources should be limited to 4000mg /day for persons without liver problems, or 2000mg /day for those with liver problems. Vicodin: May take Vicodin (hydrocodone-acetaminophen) as needed for severe pain.   Do not drive or perform other dangerous activities while taking this medication as it can cause drowsiness as well as changes in reaction time and judgement.   Please note that each pill of Vicodin contains 325 mg of acetaminophen (generic for Tylenol) and the above dosage limits apply. Methocarbamol: Methocarbamol (generic for Robaxin) is a muscle relaxer and can help relieve stiff muscles or muscle spasms.  Do not drive or perform other dangerous activities while taking this medication as it can cause drowsiness as well as changes in reaction time and judgement. Lidocaine patches: These are available via either prescription or over-the-counter. The over-the-counter option may be more economical one and are likely just as effective. There are multiple over-the-counter brands, such as Salonpas. Ice: May apply ice to the area over the next 24 hours for 15 minutes at a time to reduce pain, inflammation, and swelling, if present. Exercises: Be sure to perform the attached exercises starting with three times a week and working up to performing them daily. This is an essential part of preventing long term problems.  Follow up: Follow up with a primary care provider for any future management of these complaints. Be sure to follow up within 7-10 days. Return: Return to the ED should symptoms worsen.  For prescription assistance, may try using prescription discount sites or apps, such as goodrx.com

## 2020-08-13 ENCOUNTER — Telehealth: Payer: Self-pay | Admitting: Internal Medicine

## 2020-08-13 NOTE — Telephone Encounter (Signed)
Copied from [place] [id number]. Topic: General - Other >> Aug 13, 2020 11:51 AM Rainey Pines A wrote: Patient would like callback from Dr. Wynetta Emery nurse in regards to getting amlodipine prescription refilled due to her being out of medication and having an upcoming appointment.

## 2020-08-13 NOTE — Telephone Encounter (Signed)
Melody [name] would you be able to assist with this  °

## 2020-08-13 NOTE — Telephone Encounter (Signed)
Per Dr. Wynetta Emery on 08/01/2020, pt has not been seen in over 2 yrs and has no-showed 2 recent appts. Will need to get RF on Amlodipine through her kidney specialist until she Dr. Wynetta Emery me again to re-est care.

## 2020-08-14 ENCOUNTER — Other Ambulatory Visit: Payer: Self-pay

## 2020-08-14 ENCOUNTER — Ambulatory Visit (HOSPITAL_COMMUNITY)
Admission: EM | Admit: 2020-08-14 | Discharge: 2020-08-14 | Disposition: A | Discharge status: Home / Self Care | Payer: Medicare Other | Attending: Emergency Medicine | Admitting: Emergency Medicine

## 2020-08-14 ENCOUNTER — Encounter (HOSPITAL_COMMUNITY): Payer: Self-pay

## 2020-08-14 DIAGNOSIS — I10 Essential (primary) hypertension: Secondary | ICD-10-CM | POA: Diagnosis not present

## 2020-08-14 DIAGNOSIS — Z760 Encounter for issue of repeat prescription: Secondary | ICD-10-CM

## 2020-08-14 DIAGNOSIS — R0789 Other chest pain: Secondary | ICD-10-CM

## 2020-08-14 MED ORDER — AMLODIPINE BESYLATE 10 MG PO TABS: 10.0000 mg | ORAL_TABLET | Freq: Every day | ORAL | 1 refills | Status: DC

## 2020-08-14 NOTE — Telephone Encounter (Signed)
Contacted pt and made aware  
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
## 2020-08-14 NOTE — Discharge Instructions (Addendum)
Decrease your salt intake. diet and exercise will lower your blood pressure significantly. It is important to keep your blood pressure under good control, as having a elevated blood pressure for prolonged periods of time significantly increases your risk of stroke, heart attacks, kidney damage, eye damage, and other problems. Measure your blood pressure once a day, preferably at the same time every day. Keep a log of this and bring it to your next doctor's appointment.  Bring your blood pressure cuff as well. Return immediately to the ER if you start having chest pain, headache, problems seeing, problems talking, problems walking, if you feel like you're about to pass out, if you do pass out, if you have a seizure, or for any other concerns.  May take 1000 mg of Tylenol together 3-4 times a day as needed for chest wall pain.  You will be sore for several weeks  Go to www.goodrx.com to look up your medications. This will give you a list of where you can find your prescriptions at the most affordable prices. Or ask the pharmacist what the cash price is, or if they have any other discount programs available to help make your medication more affordable. This can be less expensive than what you would pay with insurance.
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
## 2020-08-28 IMAGING — DX DG CHEST 1V
1 series · 1 of 1 positions shown · non-contrast
Comparison: January 04, 2017

CLINICAL DATA: Cough and emesis

EXAM:
CHEST  1 VIEW

[chest ap]
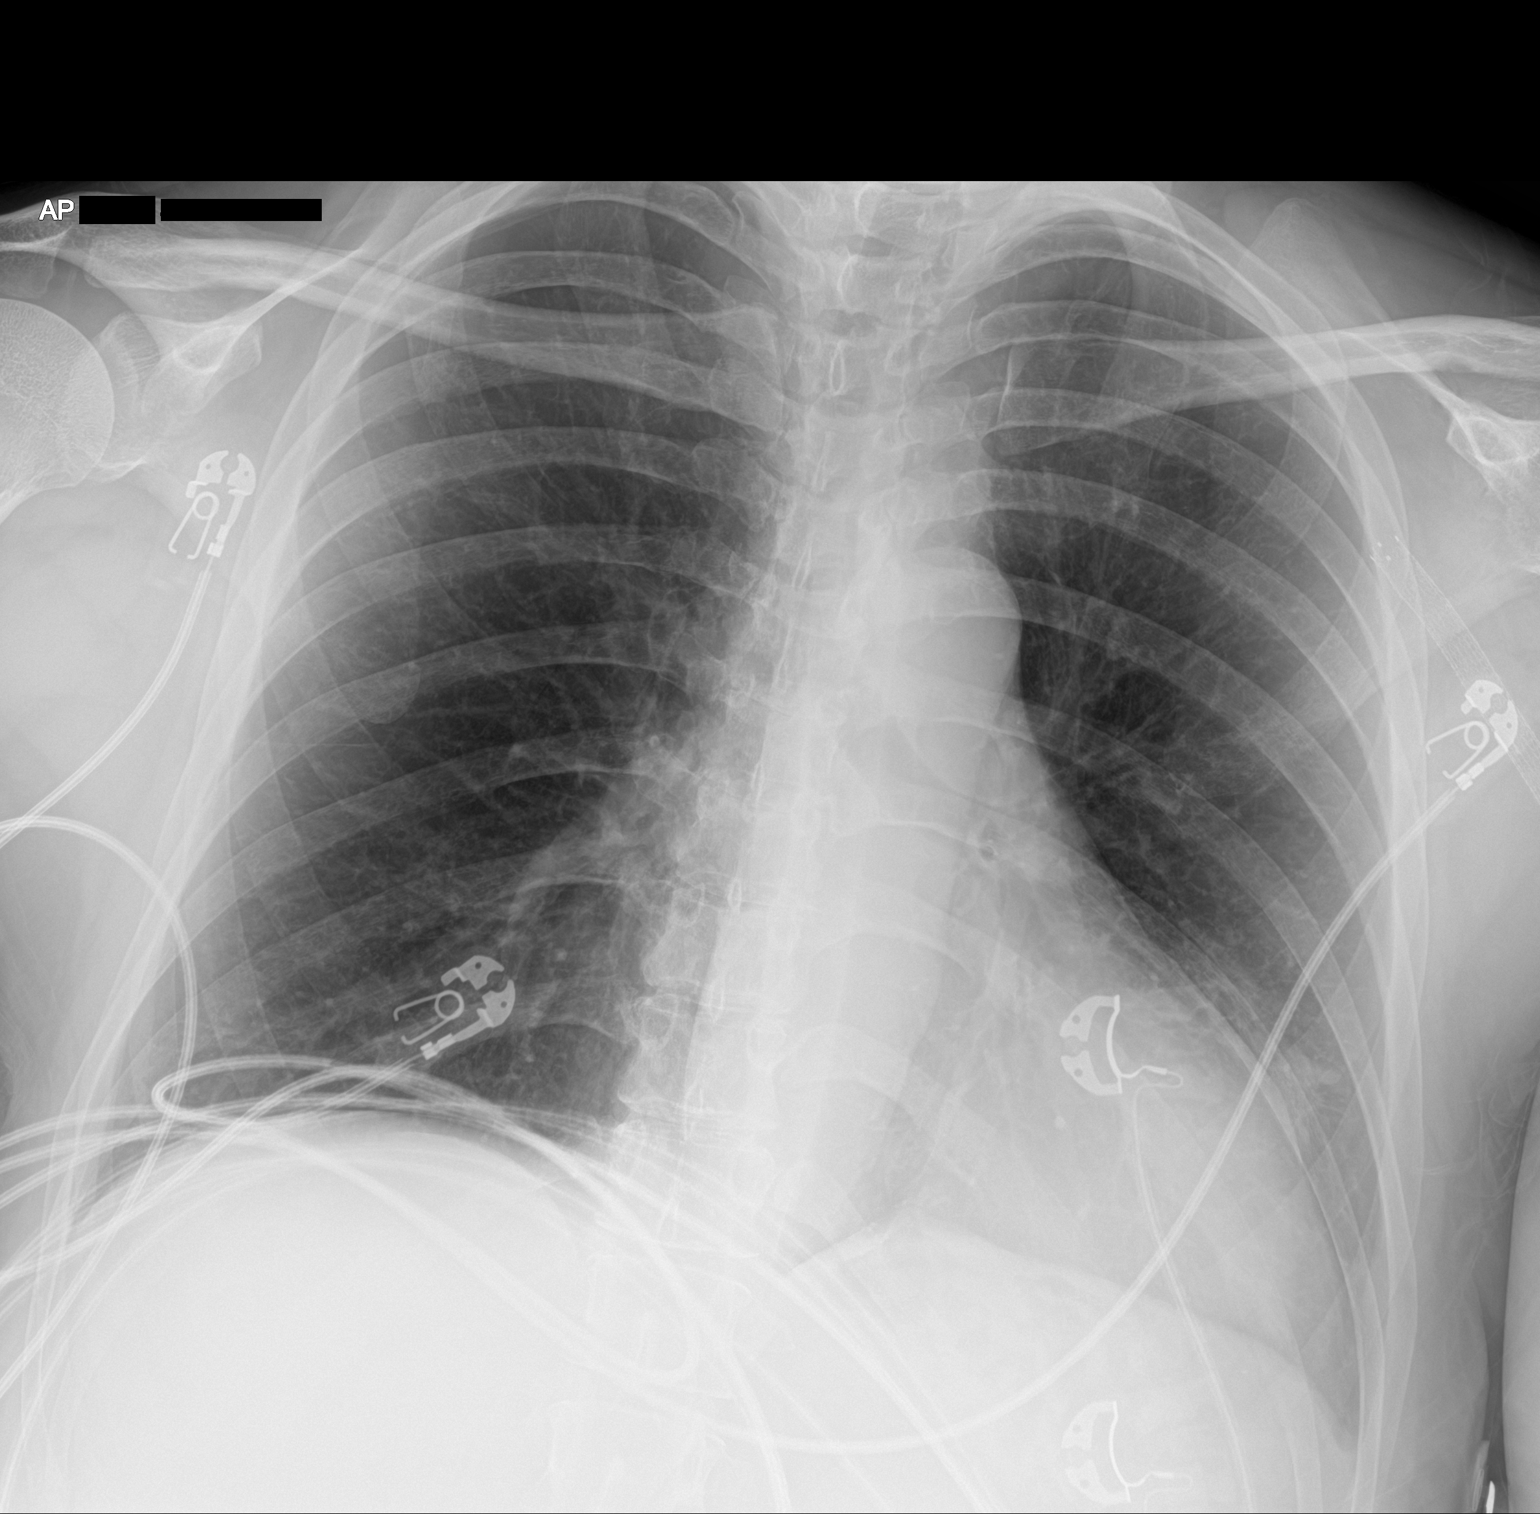

[1 of 1 positions shown; findings below may reference images not displayed]

FINDINGS: Ill-defined airspace opacity noted in the left base. Lungs elsewhere
clear. Heart is upper normal in size with pulmonary vascularity
normal. No adenopathy. No bone lesions.
IMPRESSION: Ill-defined opacity left base region concerning for pneumonia. Lungs
elsewhere clear. Heart upper normal in size.

## 2020-09-24 ENCOUNTER — Ambulatory Visit: Payer: Medicare Other | Admitting: Internal Medicine

## 2020-11-23 ENCOUNTER — Other Ambulatory Visit: Payer: Self-pay

## 2020-11-23 ENCOUNTER — Ambulatory Visit: Payer: Medicare Other | Attending: Internal Medicine | Admitting: Internal Medicine

## 2020-11-23 DIAGNOSIS — Z940 Kidney transplant status: Secondary | ICD-10-CM | POA: Diagnosis not present

## 2020-11-23 DIAGNOSIS — I10 Essential (primary) hypertension: Secondary | ICD-10-CM | POA: Diagnosis not present

## 2020-11-23 MED ORDER — AMLODIPINE BESYLATE 10 MG PO TABS: 10.0000 mg | ORAL_TABLET | Freq: Every day | ORAL | 6 refills | Status: DC

## 2020-11-23 NOTE — Progress Notes (Signed)
Virtual Visit via Telephone Note Patient had came in person but had to leave before being seen to pick up a relative.  She requested that the visit be via telephone.  I connected with Oneida Alar on 11/23/20 at 5:20 pm by telephone and verified that I am speaking with the correct person using two identifiers.  Location: Patient: home Provider: office   Participants: Myself Patient  I discussed the limitations, risks, security and privacy concerns of performing an evaluation and management service by telephone and the availability of in person appointments. I also discussed with the patient that there may be a patient responsible charge related to this service. The patient expressed understanding and agreed to proceed.   History of Present Illness: Pt with hx of HTN, former tob dep, ESRD s/p transplant, ACD, chronic diastolic CHF.  Last seen 04/2018  Since I last saw her, patient has had kidney transplant through [facility] in June 2020 for end-stage renal disease that was thought to be caused by uncontrolled hypertension.  She reports that the transplant has been successful.  Last seen by the transplant team in December of last year.  States she will be following up with them once a year.  She is still on immunosuppressive therapy including Myfortic, prednisone and Envarsus.  Main concern today is that she needs refill on amlodipine.  She is supposed to be on Norvasc 10 mg daily and labetalol 200 mg twice a day.  She had run out of the Norvasc and had to be seen at urgent care to get a short supply.  States that she checks her blood pressure about 3 times a week.  Last reading was 165/92.   Outpatient Encounter Medications as of 11/23/2020  Medication Sig  . acetaminophen (TYLENOL) 500 MG tablet Take 1,000 mg by mouth every 6 (six) hours as needed for mild pain or headache.  Marland Kitchen amLODipine (NORVASC) 10 MG tablet Take 1 tablet (10 mg total) by mouth daily.  . cinacalcet  (SENSIPAR) 30 MG tablet Take 30 mg by mouth daily.  Marland Kitchen ENVARSUS XR 1 MG TB24 Take 6 mg by mouth daily.   Marland Kitchen HYDROcodone-acetaminophen (NORCO/VICODIN) 5-325 MG tablet Take 1 tablet by mouth every 6 (six) hours as needed for severe pain.  Marland Kitchen labetalol (NORMODYNE) 200 MG tablet Take 1 tablet (200 mg total) by mouth 2 (two) times daily.  Marland Kitchen lidocaine (LIDODERM) 5 % Place 1 patch onto the skin daily. Remove & Discard patch within 12 hours or as directed by MD  . lidocaine-prilocaine (EMLA) cream Apply 1 application topically daily as needed for irritation.  . methocarbamol (ROBAXIN) 500 MG tablet Take 1 tablet (500 mg total) by mouth 2 (two) times daily.  . mycophenolate (MYFORTIC) 180 MG EC tablet Take 540 mg by mouth 2 (two) times daily.  Marland Kitchen omeprazole (PRILOSEC) 20 MG capsule Take 20 mg by mouth 2 (two) times daily.  . predniSONE (DELTASONE) 20 MG tablet Take 2 tablets (40 mg total) by mouth daily.   No facility-administered encounter medications on file as of 11/23/2020.      Observations/Objective:   Chemistry      Component Value Date/Time   NA 139 08/08/2020 2057   K 3.8 08/08/2020 2057   CL 106 08/08/2020 2057   CO2 22 08/08/2020 2057   BUN 15 08/08/2020 2057   CREATININE 1.12 (H) 08/08/2020 2057   CREATININE 1.15 (H) 10/12/2015 1703      Component Value Date/Time   CALCIUM 9.8 08/08/2020  2057   CALCIUM 8.1 (L) 09/16/2016 1327   ALKPHOS 74 08/08/2020 2057   AST 21 08/08/2020 2057   ALT 18 08/08/2020 2057   BILITOT 0.4 08/08/2020 2057   BILITOT <0.2 09/29/2017 1024     Lab Results  Component Value Date   WBC 8.9 08/08/2020   HGB 14.7 08/08/2020   HCT 44.5 08/08/2020   MCV 95.7 08/08/2020   PLT 234 08/08/2020     Assessment and Plan: 1. Essential hypertension Continue labetalol and Norvasc.  She states she has ample supply of labetalol.  Advised that she keep a log of her blood pressure reading and come in in 2 weeks to see our clinical pharmacist for blood pressure  recheck.     - amLODipine (NORVASC) 10 MG tablet; Take 1 tablet (10 mg total) by mouth daily.  Dispense: 30 tablet; Refill: 6  2. Kidney transplant recipient Follow[facility].  Stressed the importance of good blood pressure control to help prevent rejection of transplanted kidney.   Follow Up Instructions: 3 months  Follow-up with clinical pharmacist in 2 weeks for repeat blood pressure check  I discussed the assessment and treatment plan with the patient. The patient was provided an opportunity to ask questions and all were answered. The patient agreed with the plan and demonstrated an understanding of the instructions.   The patient was advised to call back or seek an in-person evaluation if the symptoms worsen or if the condition fails to improve as anticipated.  I spent 9 minutes on this telephone encounter.  Karle Plumber, MD

## 2020-11-29 ENCOUNTER — Telehealth: Payer: Self-pay | Admitting: Internal Medicine

## 2020-11-29 NOTE — Telephone Encounter (Signed)
Patient is returning a call to [place] to schedule an appt. With [employer].  Please call patient back at [phone] (

## 2020-11-29 NOTE — Telephone Encounter (Signed)
Called patient, unable to LVM because VM full. Calling to schedule patient a BP check with Luke around (12/07/20). If patient calls back please schedule.

## 2020-12-04 ENCOUNTER — Telehealth: Payer: Self-pay | Admitting: Internal Medicine

## 2020-12-04 NOTE — Telephone Encounter (Signed)
I return Pt call an schedule an appt for her BP for 12/10/20

## 2020-12-10 ENCOUNTER — Ambulatory Visit: Payer: Medicare Other | Admitting: Pharmacist

## 2020-12-31 ENCOUNTER — Encounter (HOSPITAL_COMMUNITY): Payer: Self-pay

## 2020-12-31 ENCOUNTER — Other Ambulatory Visit: Payer: Self-pay

## 2020-12-31 ENCOUNTER — Ambulatory Visit (HOSPITAL_COMMUNITY)
Admission: EM | Admit: 2020-12-31 | Discharge: 2020-12-31 | Disposition: A | Discharge status: Home / Self Care | Payer: Medicare Other | Attending: Internal Medicine | Admitting: Internal Medicine

## 2020-12-31 DIAGNOSIS — S43401A Unspecified sprain of right shoulder joint, initial encounter: Secondary | ICD-10-CM
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
## 2020-12-31 NOTE — Discharge Instructions (Signed)
Gentle range of motion exercises Lidoderm patch Continue Tylenol as needed
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
## 2021-01-04 ENCOUNTER — Encounter: Payer: Self-pay | Admitting: Internal Medicine

## 2021-01-04 ENCOUNTER — Ambulatory Visit: Payer: Medicare Other | Attending: Internal Medicine | Admitting: Internal Medicine

## 2021-01-04 ENCOUNTER — Other Ambulatory Visit: Payer: Self-pay

## 2021-01-04 VITALS — BP 122/86 | HR 71 | Resp 16 | Ht 65.5 in | Wt 162.6 lb

## 2021-01-04 DIAGNOSIS — E663 Overweight: Secondary | ICD-10-CM | POA: Diagnosis not present

## 2021-01-04 DIAGNOSIS — Z886 Allergy status to analgesic agent status: Secondary | ICD-10-CM | POA: Insufficient documentation

## 2021-01-04 DIAGNOSIS — Z1211 Encounter for screening for malignant neoplasm of colon: Secondary | ICD-10-CM | POA: Diagnosis not present

## 2021-01-04 DIAGNOSIS — R0602 Shortness of breath: Secondary | ICD-10-CM | POA: Insufficient documentation

## 2021-01-04 DIAGNOSIS — I132 Hypertensive heart and chronic kidney disease with heart failure and with stage 5 chronic kidney disease, or end stage renal disease: Secondary | ICD-10-CM | POA: Insufficient documentation

## 2021-01-04 DIAGNOSIS — I10 Essential (primary) hypertension: Secondary | ICD-10-CM

## 2021-01-04 DIAGNOSIS — Z0000 Encounter for general adult medical examination without abnormal findings: Secondary | ICD-10-CM | POA: Insufficient documentation

## 2021-01-04 DIAGNOSIS — Z888 Allergy status to other drugs, medicaments and biological substances status: Secondary | ICD-10-CM | POA: Insufficient documentation

## 2021-01-04 DIAGNOSIS — Z1231 Encounter for screening mammogram for malignant neoplasm of breast: Secondary | ICD-10-CM | POA: Insufficient documentation

## 2021-01-04 DIAGNOSIS — Z8616 Personal history of COVID-19: Secondary | ICD-10-CM | POA: Diagnosis not present

## 2021-01-04 DIAGNOSIS — F12988 Cannabis use, unspecified with other cannabis-induced disorder: Secondary | ICD-10-CM | POA: Insufficient documentation

## 2021-01-04 DIAGNOSIS — Z9189 Other specified personal risk factors, not elsewhere classified: Secondary | ICD-10-CM | POA: Diagnosis not present

## 2021-01-04 DIAGNOSIS — Z8249 Family history of ischemic heart disease and other diseases of the circulatory system: Secondary | ICD-10-CM | POA: Insufficient documentation

## 2021-01-04 DIAGNOSIS — Z885 Allergy status to narcotic agent status: Secondary | ICD-10-CM | POA: Diagnosis not present

## 2021-01-04 DIAGNOSIS — H5203 Hypermetropia, bilateral: Secondary | ICD-10-CM | POA: Diagnosis not present

## 2021-01-04 DIAGNOSIS — Z87891 Personal history of nicotine dependence: Secondary | ICD-10-CM | POA: Insufficient documentation

## 2021-01-04 DIAGNOSIS — Z7952 Long term (current) use of systemic steroids: Secondary | ICD-10-CM | POA: Insufficient documentation

## 2021-01-04 DIAGNOSIS — Z79899 Other long term (current) drug therapy: Secondary | ICD-10-CM | POA: Diagnosis not present

## 2021-01-04 DIAGNOSIS — Z940 Kidney transplant status: Secondary | ICD-10-CM | POA: Insufficient documentation

## 2021-01-04 NOTE — Patient Instructions (Signed)
Please get your COVID booster shot   Preventive Care 47-47 Years Old, Female Preventive care refers to lifestyle choices and visits with your health care provider that can promote health and wellness. This includes:  A yearly physical exam. This is also called an annual wellness visit.  Regular dental and eye exams.  Immunizations.  Screening for certain conditions.  Healthy lifestyle choices, such as: ? Eating a healthy diet. ? Getting regular exercise. ? Not using drugs or products that contain nicotine and tobacco. ? Limiting alcohol use. What can I expect for my preventive care visit? Physical exam Your health care provider will check your:  Height and weight. These may be used to calculate your BMI (body mass index). BMI is a measurement that tells if you are at a healthy weight.  Heart rate and blood pressure.  Body temperature.  Skin for abnormal spots. Counseling Your health care provider may ask you questions about your:  Past medical problems.  Family's medical history.  Alcohol, tobacco, and drug use.  Emotional well-being.  Home life and relationship well-being.  Sexual activity.  Diet, exercise, and sleep habits.  Work and work Statistician.  Access to firearms.  Method of birth control.  Menstrual cycle.  Pregnancy history. What immunizations do I need? Vaccines are usually given at various ages, according to a schedule. Your health care provider will recommend vaccines for you based on your age, medical history, and lifestyle or other factors, such as travel or where you work.   What tests do I need? Blood tests  Lipid and cholesterol levels. These may be checked every 5 years, or more often if you are over 35 years old.  Hepatitis C test.  Hepatitis B test. Screening  Lung cancer screening. You may have this screening every year starting at age 39 if you have a 30-pack-year history of smoking and currently smoke or have quit within  the past 15 years.  Colorectal cancer screening. ? All adults should have this screening starting at age 47 and continuing until age 20. ? Your health care provider may recommend screening at age 74 if you are at increased risk. ? You will have tests every 1-10 years, depending on your results and the type of screening test.  Diabetes screening. ? This is done by checking your blood sugar (glucose) after you have not eaten for a while (fasting). ? You may have this done every 1-3 years.  Mammogram. ? This may be done every 1-2 years. ? Talk with your health care provider about when you should start having regular mammograms. This may depend on whether you have a family history of breast cancer.  BRCA-related cancer screening. This may be done if you have a family history of breast, ovarian, tubal, or peritoneal cancers.  Pelvic exam and Pap test. ? This may be done every 3 years starting at age 47. ? Starting at age 45, this may be done every 5 years if you have a Pap test in combination with an HPV test. Other tests  STD (sexually transmitted disease) testing, if you are at risk.  Bone density scan. This is done to screen for osteoporosis. You may have this scan if you are at high risk for osteoporosis. Talk with your health care provider about your test results, treatment options, and if necessary, the need for more tests. Follow these instructions at home: Eating and drinking  Eat a diet that includes fresh fruits and vegetables, whole grains, lean protein, and low-fat  dairy products.  Take vitamin and mineral supplements as recommended by your health care provider.  Do not drink alcohol if: ? Your health care provider tells you not to drink. ? You are pregnant, may be pregnant, or are planning to become pregnant.  If you drink alcohol: ? Limit how much you have to 0-1 drink a day. ? Be aware of how much alcohol is in your drink. In the [place] one drink equals one 12 oz  bottle of beer (355 mL), one 5 oz glass of wine (148 mL), or one 1 oz glass of hard liquor (44 mL).   Lifestyle  Take daily care of your teeth and gums. Brush your teeth every morning and night with fluoride toothpaste. Floss one time each day.  Stay active. Exercise for at least 30 minutes 5 or more days each week.  Do not use any products that contain nicotine or tobacco, such as cigarettes, e-cigarettes, and chewing tobacco. If you need help quitting, ask your health care provider.  Do not use drugs.  If you are sexually active, practice safe sex. Use a condom or other form of protection to prevent STIs (sexually transmitted infections).  If you do not wish to become pregnant, use a form of birth control. If you plan to become pregnant, see your health care provider for a prepregnancy visit.  If told by your health care provider, take low-dose aspirin daily starting at age 80.  Find healthy ways to cope with stress, such as: ? Meditation, yoga, or listening to music. ? Journaling. ? Talking to a trusted person. ? Spending time with friends and family. Safety  Always wear your seat belt while driving or riding in a vehicle.  Do not drive: ? If you have been drinking alcohol. Do not ride with someone who has been drinking. ? When you are tired or distracted. ? While texting.  Wear a helmet and other protective equipment during sports activities.  If you have firearms in your house, make sure you follow all gun safety procedures. What's next?  Visit your health care provider once a year for an annual wellness visit.  Ask your health care provider how often you should have your eyes and teeth checked.  Stay up to date on all vaccines. This information is not intended to replace advice given to you by your health care provider. Make sure you discuss any questions you have with your health care provider. Document Revised: 05/15/2020 Document Reviewed: 04/22/2018 Elsevier  Patient Education  2021 [place] Following a healthy eating pattern may help you to achieve and maintain a healthy body weight, reduce the risk of chronic disease, and live a long and productive life. It is important to follow a healthy eating pattern at an appropriate calorie level for your body. Your nutritional needs should be met primarily through food by choosing a variety of nutrient-rich foods. What are tips for following this plan? Reading food labels  Read labels and choose the following: ? Reduced or low sodium. ? Juices with 100% fruit juice. ? Foods with low saturated fats and high polyunsaturated and monounsaturated fats. ? Foods with whole grains, such as whole wheat, cracked wheat, brown rice, and wild rice. ? Whole grains that are fortified with folic acid. This is recommended for women who are pregnant or who want to become pregnant.  Read labels and avoid the following: ? Foods with a lot of added sugars. These include foods that  contain brown sugar, corn sweetener, corn syrup, dextrose, fructose, glucose, high-fructose corn syrup, honey, invert sugar, lactose, malt syrup, maltose, molasses, raw sugar, sucrose, trehalose, or turbinado sugar.  Do not eat more than the following amounts of added sugar per day:  6 teaspoons (25 g) for women.  9 teaspoons (38 g) for men. ? Foods that contain processed or refined starches and grains. ? Refined grain products, such as white flour, degermed cornmeal, white bread, and white rice. Shopping  Choose nutrient-rich snacks, such as vegetables, whole fruits, and nuts. Avoid high-calorie and high-sugar snacks, such as potato chips, fruit snacks, and candy.  Use oil-based dressings and spreads on foods instead of solid fats such as butter, stick margarine, or cream cheese.  Limit pre-made sauces, mixes, and "instant" products such as flavored rice, instant noodles, and ready-made pasta.  Try more  plant-protein sources, such as tofu, tempeh, black beans, edamame, lentils, nuts, and seeds.  Explore eating plans such as the Mediterranean diet or vegetarian diet. Cooking  Use oil to saut or stir-fry foods instead of solid fats such as butter, stick margarine, or lard.  Try baking, boiling, grilling, or broiling instead of frying.  Remove the fatty part of meats before cooking.  Steam vegetables in water or broth. Meal planning  At meals, imagine dividing your plate into fourths: ? One-half of your plate is fruits and vegetables. ? One-fourth of your plate is whole grains. ? One-fourth of your plate is protein, especially lean meats, poultry, eggs, tofu, beans, or nuts.  Include low-fat dairy as part of your daily diet.   Lifestyle  Choose healthy options in all settings, including home, work, school, restaurants, or stores.  Prepare your food safely: ? Wash your hands after handling raw meats. ? Keep food preparation surfaces clean by regularly washing with hot, soapy water. ? Keep raw meats separate from ready-to-eat foods, such as fruits and vegetables. ? Cook seafood, meat, poultry, and eggs to the recommended internal temperature. ? Store foods at safe temperatures. In general:  Keep cold foods at 26F (4.4C) or below.  Keep hot foods at 126F (60C) or above.  Keep your freezer at [facility] (-17.8C) or below.  Foods are no longer safe to eat when they have been between the temperatures of 40-126F (4.4-60C) for more than 2 hours. What foods should I eat? Fruits Aim to eat 2 cup-equivalents of fresh, canned (in natural juice), or frozen fruits each day. Examples of 1 cup-equivalent of fruit include 1 small apple, 8 large strawberries, 1 cup canned fruit,  cup dried fruit, or 1 cup 100% juice. Vegetables Aim to eat 2-3 cup-equivalents of fresh and frozen vegetables each day, including different varieties and colors. Examples of 1 cup-equivalent of vegetables include  2 medium carrots, 2 cups raw, leafy greens, 1 cup chopped vegetable (raw or cooked), or 1 medium baked potato. Grains Aim to eat 6 ounce-equivalents of whole grains each day. Examples of 1 ounce-equivalent of grains include 1 slice of bread, 1 cup ready-to-eat cereal, 3 cups popcorn, or  cup cooked rice, pasta, or cereal. Meats and other proteins Aim to eat 5-6 ounce-equivalents of protein each day. Examples of 1 ounce-equivalent of protein include 1 egg, 1/2 cup nuts or seeds, or 1 tablespoon (16 g) peanut butter. A cut of meat or fish that is the size of a deck of cards is about 3-4 ounce-equivalents.  Of the protein you eat each week, try to have at least 8 ounces come from seafood.  This includes salmon, trout, herring, and anchovies. Dairy Aim to eat 3 cup-equivalents of fat-free or low-fat dairy each day. Examples of 1 cup-equivalent of dairy include 1 cup (240 mL) milk, 8 ounces (250 g) yogurt, 1 ounces (44 g) natural cheese, or 1 cup (240 mL) fortified soy milk. Fats and oils  Aim for about 5 teaspoons (21 g) per day. Choose monounsaturated fats, such as canola and olive oils, avocados, peanut butter, and most nuts, or polyunsaturated fats, such as sunflower, corn, and soybean oils, walnuts, pine nuts, sesame seeds, sunflower seeds, and flaxseed. Beverages  Aim for six 8-oz glasses of water per day. Limit coffee to three to five 8-oz cups per day.  Limit caffeinated beverages that have added calories, such as soda and energy drinks.  Limit alcohol intake to no more than 1 drink a day for nonpregnant women and 2 drinks a day for men. One drink equals 12 oz of beer (355 mL), 5 oz of wine (148 mL), or 1 oz of hard liquor (44 mL). Seasoning and other foods  Avoid adding excess amounts of salt to your foods. Try flavoring foods with herbs and spices instead of salt.  Avoid adding sugar to foods.  Try using oil-based dressings, sauces, and spreads instead of solid fats. This information  is based on general [place] nutrition guidelines. For more information, visit [employer]. Exact amounts may vary based on your nutrition needs. Summary  A healthy eating plan may help you to maintain a healthy weight, reduce the risk of chronic diseases, and stay active throughout your life.  Plan your meals. Make sure you eat the right portions of a variety of nutrient-rich foods.  Try baking, boiling, grilling, or broiling instead of frying.  Choose healthy options in all settings, including home, work, school, restaurants, or stores. This information is not intended to replace advice given to you by your health care provider. Make sure you discuss any questions you have with your health care provider. Document Revised: 11/23/2017 Document Reviewed: 11/23/2017 Elsevier Patient Education  [place].

## 2021-01-04 NOTE — Progress Notes (Signed)
Patient ID: Melody [name], female    DOB: [birth year]  MRN: [id number]  CC: Annual Exam   Subjective: Melody [name] is a 45 y.o. female who presents for annual exam Her concerns today include:  Pt with hx of HTN,formertob dep, ESRD s/p transplant, ACD, chronic diastolic CHF.  Last seen 04/2018  HTN:  Checks BP QOD.  Gives range 130/80-90s reports compliance with taking amlodipine.  No chest pains.  Occasional shortness of breath.  No lower extremity edema.    Patient Active Problem List   Diagnosis Date Noted  . Kidney transplant recipient 11/23/2020  . Acute hypoxemic respiratory failure due to COVID-19 ([place]) 04/03/2020  . COVID-19 04/03/2020  . Acute respiratory failure with hypoxia ([place]) 04/02/2020  . Intractable nausea and vomiting 04/02/2020  . Acute bursitis of left shoulder 05/18/2018  . Marijuana use, continuous 05/04/2018  . Multiple thyroid nodules 05/04/2018  . Cervical radiculopathy 05/04/2018  . Former tobacco use 09/29/2017  . Pre-transplant evaluation for kidney transplant 02/12/2017  . Drug abuse ([place])   . Essential hypertension      Current Outpatient Medications on File Prior to Visit  Medication Sig Dispense Refill  . acetaminophen (TYLENOL) 500 MG tablet Take 1,000 mg by mouth every 6 (six) hours as needed for mild pain or headache.    Marland Kitchen amLODipine (NORVASC) 10 MG tablet Take 1 tablet (10 mg total) by mouth daily. 30 tablet 6  . cinacalcet (SENSIPAR) 30 MG tablet Take 30 mg by mouth daily.    Marland Kitchen ENVARSUS XR 1 MG TB24 Take 6 mg by mouth daily.     Marland Kitchen labetalol (NORMODYNE) 200 MG tablet Take 1 tablet (200 mg total) by mouth 2 (two) times daily. 60 tablet 1  . lidocaine (LIDODERM) 5 % Place 1 patch onto the skin daily. Remove & Discard patch within 12 hours or as directed by MD (Patient not taking: Reported on 01/04/2021) 30 patch 0  . lidocaine-prilocaine (EMLA) cream Apply 1 application topically daily as needed for irritation. (Patient not  taking: Reported on 01/04/2021)  0  . methocarbamol (ROBAXIN) 500 MG tablet Take 1 tablet (500 mg total) by mouth 2 (two) times daily. 20 tablet 0  . mycophenolate (MYFORTIC) 180 MG EC tablet Take 540 mg by mouth 2 (two) times daily.    Marland Kitchen omeprazole (PRILOSEC) 20 MG capsule Take 20 mg by mouth 2 (two) times daily.    . predniSONE (DELTASONE) 20 MG tablet Take 2 tablets (40 mg total) by mouth daily. 10 tablet 0   No current facility-administered medications on file prior to visit.    Allergies  Allergen Reactions  . Aluminum Chloride Hives  . Codeine Other (See Comments)    Tylenol #3 causes nightmares  . Tramadol Hives    Social History   Socioeconomic History  . Marital status: Significant Other    Spouse name: Not on file  . Number of children: 2   . Years of education: some college  . Highest education level: Not on file  Occupational History  . Occupation: Owns Education administrator business  Tobacco Use  . Smoking status: Former Smoker    Packs/day: 0.00    Years: 0.00    Pack years: 0.00    Types: Cigars, Cigarettes    Start date: 09/11/2016  . Smokeless tobacco: Never Used  . Tobacco comment: pt stopped smoking a couple months ago  Vaping Use  . Vaping Use: Never used  Substance and Sexual Activity  . Alcohol use:  Yes    Alcohol/week: 0.0 standard drinks    Comment: occasionally  . Drug use: Yes    Types: Marijuana    Comment: occasionally  . Sexual activity: Yes    Birth control/protection: None  Other Topics Concern  . Not on file  Social History Narrative   Looking for work    40 and 13 yr son's.    3 grandkids    Social Determinants of Radio broadcast assistant Strain: Not on file  Food Insecurity: Not on file  Transportation Needs: Not on file  Physical Activity: Not on file  Stress: Not on file  Social Connections: Not on file  Intimate Partner Violence: Not on file    Family History  Problem Relation Age of Onset  . CAD Mother   . Hypertension  Mother   . Heart disease Mother   . Kidney failure Mother   . Cirrhosis Father   . CAD Brother   . Hypertension Other   . Kidney failure Other   . Hypertension Maternal Grandmother   . Kidney failure Maternal Grandmother   . Congestive Heart Failure Maternal Grandmother   . Breast cancer Sister   . Thyroid disease Neg Hx     Past Surgical History:  Procedure Laterality Date  . AV FISTULA PLACEMENT Right 09/15/2016   Procedure: ARTERIOVENOUS (AV) FISTULA CREATION UPPER RIGHT ARM;  Surgeon: Rosetta Posner, MD;  Location: [place];  Service: Vascular;  Laterality: Right;  . AV FISTULA PLACEMENT Left 12/30/2016   Procedure: ARTERIOVENOUS (AV) FISTULA CREATION USING GORETEX STRETCH GRAFT;  Surgeon: Elam Dutch, MD;  [facility];  Service: Vascular;  Laterality: Left;  . AV FISTULA PLACEMENT Left 06/15/2017   Procedure: INSERTION OF ARTERIOVENOUS (AV) GORE-TEX GRAFT 6MM X 40CM LEFT ARM;  Surgeon: Rosetta Posner, MD;  Location: [place];  Service: Vascular;  Laterality: Left;  . AV FISTULA PLACEMENT Right 08/05/2018   Procedure: Insertion of Right arm gortex graft;  Surgeon: Rosetta Posner, MD;  Location: [place];  Service: Vascular;  Laterality: Right;  . DILATION AND CURETTAGE OF UTERUS    . INSERTION OF DIALYSIS CATHETER Right 09/15/2016   Procedure: INSERTION OF DIALYSIS CATHETER ON RIGHT SIDE;  Surgeon: Rosetta Posner, MD;  Location: [place];  Service: Vascular;  Laterality: Right;  . KIDNEY TRANSPLANT Right 02/18/2019    ROS: Review of Systems  Constitutional:       Walks few times a wk.   Doing good with eating habits.  HENT: Negative for congestion, hearing loss and trouble swallowing.        Request dental referral for routine dental care  Eyes: Positive for visual disturbance (problems seeing things close up. Never had eye exam).  Respiratory: Positive for shortness of breath (occasionally). Negative for chest tightness.   Cardiovascular: Negative for chest pain.  Gastrointestinal:  Negative for blood in stool.       Moving bowels okay  Genitourinary: Negative for difficulty urinating and dysuria.       Menopausal.  Last cycle 2018  Skin: Negative for rash.  Neurological: Negative for dizziness and headaches.  Hematological: Does not bruise/bleed easily.  Psychiatric/Behavioral: Negative for dysphoric mood and sleep disturbance.   :HM:  Due for colon CA, due for COVID booster, due for mammogram  PHYSICAL EXAM: BP 122/86   Pulse 71   Resp 16   Ht 5' 5.5" (1.664 m)   Wt 162 lb 9.6 oz (73.8 kg)   SpO2  98%   BMI 26.65 kg/m   Physical Exam  General appearance - alert, well appearing, late African-American female and in no distress Mental status - normal mood, behavior, speech, dress, motor activity, and thought processes Eyes - pupils equal and reactive, extraocular eye movements intact Ears - bilateral TM's and external ear canals normal Nose - normal and patent, no erythema, discharge or polyps Mouth - mucous membranes moist, pharynx normal without lesions Neck - supple, no significant adenopathy Lymphatics - no palpable lymphadenopathy, no hepatosplenomegaly Chest - clear to auscultation, no wheezes, rales or rhonchi, symmetric air entry Heart - normal rate, regular rhythm, normal S1, S2, no murmurs, rubs, clicks or gallops Abdomen - soft, nontender, nondistended, no masses or organomegaly Pelvic -deferred.  Patient did not have time today for [place] to do the Pap but she is agreeable to coming back in about 4 weeks for [place] to do a Pap Musculoskeletal - no joint tenderness, deformity or swelling Extremities - peripheral pulses normal, no pedal edema, no clubbing or cyanosis Skin - normal coloration and turgor, no rashes, no suspicious skin lesions noted   Depression screen [facility] 2/9 01/04/2021 04/22/2018 09/29/2017  Decreased Interest 0 0 1  Down, Depressed, Hopeless 0 0 1  PHQ - 2 Score 0 0 2  Altered sleeping - - 0  Tired, decreased energy - - 1  Change in  appetite - - 0  Feeling bad or failure about yourself  - - 0  Trouble concentrating - - 0  Moving slowly or fidgety/restless - - 0  Suicidal thoughts - - 0  PHQ-9 Score - - 3   GAD 7 : Generalized Anxiety Score 01/04/2021 04/22/2018 09/29/2017 10/12/2015  Nervous, Anxious, on Edge 0 0 0 0  Control/stop worrying 0 0 0 1  Worry too much - different things 0 0 0 0  Trouble relaxing 1 3 0 0  Restless 0 1 0 0  Easily annoyed or irritable 0 0 1 0  Afraid - awful might happen 0 0 1 0  Total GAD 7 Score 1 4 2 1      CMP Latest Ref Rng & Units 08/08/2020 04/06/2020 04/05/2020  Glucose 70 - 99 mg/dL 87 189(H) 149(H)  BUN 6 - 20 mg/dL 15 24(H) 18  Creatinine 0.44 - 1.00 mg/dL 1.12(H) 1.35(H) 1.22(H)  Sodium 135 - 145 mmol/L 139 133(L) 136  Potassium 3.5 - 5.1 mmol/L 3.8 3.6 3.7  Chloride 98 - 111 mmol/L 106 100 102  CO2 22 - 32 mmol/L 22 23 24   Calcium 8.9 - 10.3 mg/dL 9.8 9.9 10.3  Total Protein 6.5 - 8.1 g/dL 7.9 6.0(L) 6.7  Total Bilirubin 0.3 - 1.2 mg/dL 0.4 0.8 0.8  Alkaline Phos 38 - 126 U/L 74 43 49  AST 15 - 41 U/L 21 16 19   ALT 0 - 44 U/L 18 19 22    Lipid Panel     Component Value Date/Time   CHOL 285 (H) 09/29/2017 1024   TRIG 89 09/29/2017 1024   HDL 91 09/29/2017 1024   CHOLHDL 3.1 09/29/2017 1024   CHOLHDL 2.5 01/15/2015 0245   VLDL 15 01/15/2015 0245   LDLCALC 176 (H) 09/29/2017 1024    CBC    Component Value Date/Time   WBC 8.9 08/08/2020 2057   RBC 4.65 08/08/2020 2057   HGB 14.7 08/08/2020 2057   HCT 44.5 08/08/2020 2057   PLT 234 08/08/2020 2057   MCV 95.7 08/08/2020 2057   MCH 31.6 08/08/2020 2057  MCHC 33.0 08/08/2020 2057   RDW 12.5 08/08/2020 2057   LYMPHSABS 1.2 08/08/2020 2057   MONOABS 0.6 08/08/2020 2057   EOSABS 0.1 08/08/2020 2057   BASOSABS 0.1 08/08/2020 2057    ASSESSMENT AND PLAN: 1. Annual physical exam  2. Essential hypertension Close to goal.  Continue current medication and low-salt diet  3. Screening for colon cancer Discussed  colon cancer screening methods.  Patient prefers to have colonoscopy.  Referral submitted to gastroenterology.  4. Encounter for screening mammogram for malignant neoplasm of breast - MM Digital Screening; Future  5. Over weight Discussed the importance of healthy eating habits and regular exercise.  Goal is to get in 150 minutes/week total of moderate intensity exercise.  6. Need for dental care - Ambulatory referral to Dentistry  7. Farsightedness, bilateral - Ambulatory referral to Ophthalmology  Patient was given the opportunity to ask questions.  Patient verbalized understanding of the plan and was able to repeat key elements of the plan.   Orders Placed This Encounter  Procedures  . MM Digital Screening  . Ambulatory referral to Ophthalmology  . Ambulatory referral to Dentistry     Requested Prescriptions    No prescriptions requested or ordered in this encounter    Return in about 4 weeks (around 02/01/2021) for PAP.  Karle Plumber, MD, FACP

## 2021-02-03 ENCOUNTER — Encounter (HOSPITAL_COMMUNITY): Payer: Self-pay | Admitting: *Deleted

## 2021-02-03 ENCOUNTER — Ambulatory Visit (HOSPITAL_COMMUNITY)
Admission: EM | Admit: 2021-02-03 | Discharge: 2021-02-03 | Disposition: A | Discharge status: Home / Self Care | Payer: Medicare Other | Attending: Medical Oncology | Admitting: Medical Oncology

## 2021-02-03 ENCOUNTER — Other Ambulatory Visit: Payer: Self-pay

## 2021-02-03 DIAGNOSIS — M25511 Pain in right shoulder: Secondary | ICD-10-CM

## 2021-02-03 MED ORDER — CYCLOBENZAPRINE HCL 10 MG PO TABS: 10.0000 mg | ORAL_TABLET | Freq: Two times a day (BID) | ORAL | 0 refills | Status: DC | PRN

## 2021-02-03 NOTE — ED Triage Notes (Signed)
Pt reports Rt shoulder pain that started 4 weeks ago and worse today.

## 2021-02-03 NOTE — ED Provider Notes (Signed)
[place]    CSN: [id number] Arrival date & time: 02/03/21  1718      History   Chief Complaint Chief Complaint  Patient presents with   Shoulder Pain    HPI Melody [name] is a 45 y.o. female.   HPI  Shoulder Pain: Patient has a history of right shoulder pain that she has had for the past 4 weeks. Today pain worsened.  She states that pain initially started after she tried to swat at someone in her arm overextended.  She states that since this time she has been using Tylenol and cold or warm compress without much success.  She states that when symptoms really occurred today she was grabbing her work bag. No neurological changes, swelling, skin color change, edema or warmth difference of bilateral hands.   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[place]) 01/04/2021   Kidney transplant recipient 11/23/2020   COVID-19 04/03/2020   Intractable nausea and vomiting 04/02/2020   Acute bursitis of left shoulder 05/18/2018   Marijuana use, continuous 05/04/2018   Multiple thyroid nodules 05/04/2018   Cervical radiculopathy 05/04/2018   Former tobacco use 09/29/2017   Pre-transplant evaluation for kidney transplant 02/12/2017   Drug abuse [facility])    Essential hypertension     Past Surgical History:  Procedure Laterality Date   AV FISTULA PLACEMENT Right 09/15/2016   Procedure: ARTERIOVENOUS (AV) FISTULA CREATION UPPER RIGHT ARM;  Surgeon: Rosetta Posner, MD;  Location: [place];  Service: Vascular;  Laterality: Right;   AV FISTULA PLACEMENT Left 12/30/2016   Procedure: ARTERIOVENOUS (AV) FISTULA CREATION USING GORETEX STRETCH GRAFT;  Surgeon: Elam Dutch, MD;  Location: [facility] OR;  Service: Vascular;  Laterality: Left;   AV FISTULA PLACEMENT Left 06/15/2017   Procedure: INSERTION OF ARTERIOVENOUS (AV) GORE-TEX GRAFT 6MM X 40CM LEFT ARM;  Surgeon: Rosetta Posner, MD;  Location: [place];  Service: Vascular;  Laterality: Left;   AV FISTULA PLACEMENT Right 08/05/2018   Procedure: Insertion of Right arm gortex graft;  Surgeon: Rosetta Posner, MD;  Location: [facility] OR;  Service: Vascular;  Laterality: Right;   DILATION AND CURETTAGE OF UTERUS     INSERTION OF DIALYSIS CATHETER Right 09/15/2016   Procedure: INSERTION OF DIALYSIS CATHETER ON RIGHT SIDE;  Surgeon: Rosetta Posner, MD;  [facility];  Service: Vascular;  Laterality: Right;   KIDNEY TRANSPLANT Right 02/18/2019    OB History   No obstetric history on file.      Home Medications    Prior to Admission medications   Medication Sig Start Date End Date Taking? Authorizing Provider  acetaminophen (TYLENOL) 500 MG tablet Take 1,000 mg by mouth every 6 (six) hours as needed for mild pain or headache.    [provider]  amLODipine (NORVASC) 10 MG tablet Take 1 tablet (10 mg total) by mouth daily. 11/23/20   Ladell Pier, MD  cinacalcet (SENSIPAR) 30 MG tablet Take 30 mg by mouth daily. 03/22/20   [provider]  ENVARSUS XR 1 MG TB24 Take 6 mg by mouth  daily.  02/08/20   [provider]  labetalol (NORMODYNE) 200 MG tablet Take 1 tablet (200 mg total) by mouth 2 (two) times daily. 06/12/20   Vanessa Kick, MD  lidocaine (LIDODERM) 5 % Place 1 patch onto the skin daily. Remove & Discard patch within 12 hours or as directed by MD Patient not taking: Reported on 01/04/2021 08/08/20   Joy, Raquel Sarna C, PA-C  lidocaine-prilocaine (EMLA) cream Apply 1 application topically daily as needed for irritation. Patient not taking: Reported on 01/04/2021 04/23/17   [provider]  methocarbamol (ROBAXIN) 500 MG tablet Take 1 tablet (500 mg total) by mouth 2 (two) times daily.  08/08/20   Joy, Shawn C, PA-C  mycophenolate (MYFORTIC) 180 MG EC tablet Take 540 mg by mouth 2 (two) times daily. 02/20/20   [provider]  omeprazole (PRILOSEC) 20 MG capsule Take 20 mg by mouth 2 (two) times daily. 03/22/20   [provider]  predniSONE (DELTASONE) 20 MG tablet Take 2 tablets (40 mg total) by mouth daily. 06/12/20   Vanessa Kick, MD    Family History Family History  Problem Relation Age of Onset   CAD Mother    Hypertension Mother    Heart disease Mother    Kidney failure Mother    Cirrhosis Father    CAD Brother    Hypertension Other    Kidney failure Other    Hypertension Maternal Grandmother    Kidney failure Maternal Grandmother    Congestive Heart Failure Maternal Grandmother    Breast cancer Sister    Thyroid disease Neg Hx     Social History Social History   Tobacco Use   Smoking status: Former    Packs/day: 0.00    Years: 0.00    Pack years: 0.00    Types: Cigars, Cigarettes    Start date: 09/11/2016   Smokeless tobacco: Never   Tobacco comments:    pt stopped smoking a couple months ago  Vaping Use   Vaping Use: Never used  Substance Use Topics   Alcohol use: Yes    Alcohol/week: 0.0 standard drinks    Comment: occasionally   Drug use: Yes    Types: Marijuana    Comment: occasionally     Allergies   Aluminum chloride, Codeine, and Tramadol   Review of Systems Review of Systems  As stated above in HPI Physical Exam Triage Vital Signs ED Triage Vitals  Enc Vitals Group     BP 02/03/21 1730 134/90     Pulse Rate 02/03/21 1730 80     Resp 02/03/21 1730 18     Temp 02/03/21 1730 98.5 F (36.9 C)     Temp src --      SpO2 02/03/21 1730 96 %     Weight --      Height --      Head Circumference --      Peak Flow --      Pain Score 02/03/21 1726 9     Pain Loc --      Pain Edu? --      Excl. in [place]? --    No data found.  Updated Vital Signs BP 134/90   Pulse 80   Temp 98.5 F (36.9 C)   Resp 18    SpO2 96%   Physical Exam Vitals and nursing note reviewed.  Constitutional:      General: She is not in acute distress.    Appearance: Normal appearance. She is not  ill-appearing, toxic-appearing or diaphoretic.  HENT:     Head: Normocephalic and atraumatic.  Cardiovascular:     Pulses: Normal pulses.  Musculoskeletal:        General: No swelling.     Right shoulder: Tenderness present. No swelling, deformity, bony tenderness or crepitus. Decreased range of motion (due to pain). Normal strength. Normal pulse.     Left shoulder: Normal.     Cervical back: Normal range of motion and neck supple. Tenderness (right trapezius) present. No rigidity.  Skin:    General: Skin is warm.     Coloration: Skin is not jaundiced or pale.  Neurological:     General: No focal deficit present.     Mental Status: She is alert and oriented to person, place, and time.     UC Treatments / Results  Labs (all labs ordered are listed, but only abnormal results are displayed) Labs Reviewed - No data to display  EKG   Radiology No results found.  Procedures Procedures (including critical care time)  Medications Ordered in UC Medications - No data to display  Initial Impression / Assessment and Plan / UC Course  I have reviewed the triage vital signs and the nursing notes.  Pertinent labs & imaging results that were available during my care of the patient were reviewed by me and considered in my medical decision making (see chart for details).     New.  Treating with muscle relaxer and cold compress. Avoid with ETOH. NSAID might be helpful as well.  In addition I have recommended orthopedic follow up given that she has had this pain for 4 weeks. Final Clinical Impressions(s) / UC Diagnoses   Final diagnoses:  None   Discharge Instructions   None    ED Prescriptions   None    PDMP not reviewed this encounter.   Hughie Closs, [place] 02/03/21 1805

## 2021-02-05 DIAGNOSIS — D849 Immunodeficiency, unspecified: Secondary | ICD-10-CM | POA: Diagnosis not present

## 2021-02-05 DIAGNOSIS — Z940 Kidney transplant status: Secondary | ICD-10-CM | POA: Diagnosis not present

## 2021-02-05 DIAGNOSIS — R7989 Other specified abnormal findings of blood chemistry: Secondary | ICD-10-CM | POA: Diagnosis not present

## 2021-02-19 DIAGNOSIS — Z8616 Personal history of COVID-19: Secondary | ICD-10-CM | POA: Diagnosis not present

## 2021-02-19 DIAGNOSIS — I129 Hypertensive chronic kidney disease with stage 1 through stage 4 chronic kidney disease, or unspecified chronic kidney disease: Secondary | ICD-10-CM | POA: Diagnosis not present

## 2021-02-19 DIAGNOSIS — E785 Hyperlipidemia, unspecified: Secondary | ICD-10-CM | POA: Diagnosis not present

## 2021-02-19 DIAGNOSIS — B259 Cytomegaloviral disease, unspecified: Secondary | ICD-10-CM | POA: Diagnosis not present

## 2021-02-19 DIAGNOSIS — D849 Immunodeficiency, unspecified: Secondary | ICD-10-CM | POA: Diagnosis not present

## 2021-02-19 DIAGNOSIS — Z940 Kidney transplant status: Secondary | ICD-10-CM | POA: Diagnosis not present

## 2021-02-19 DIAGNOSIS — N2581 Secondary hyperparathyroidism of renal origin: Secondary | ICD-10-CM | POA: Diagnosis not present

## 2021-02-19 DIAGNOSIS — N189 Chronic kidney disease, unspecified: Secondary | ICD-10-CM | POA: Diagnosis not present

## 2021-02-19 DIAGNOSIS — D631 Anemia in chronic kidney disease: Secondary | ICD-10-CM | POA: Diagnosis not present

## 2021-03-14 ENCOUNTER — Ambulatory Visit
Admission: RE | Admit: 2021-03-14 | Discharge: 2021-03-14 | Disposition: A | Discharge status: Home / Self Care | Payer: Medicare Other | Source: Ambulatory Visit | Attending: Internal Medicine | Admitting: Internal Medicine

## 2021-03-14 ENCOUNTER — Other Ambulatory Visit: Payer: Self-pay

## 2021-03-14 DIAGNOSIS — Z1231 Encounter for screening mammogram for malignant neoplasm of breast: Secondary | ICD-10-CM | POA: Diagnosis not present

## 2021-03-19 ENCOUNTER — Encounter (HOSPITAL_COMMUNITY): Payer: Self-pay | Admitting: *Deleted

## 2021-03-19 ENCOUNTER — Other Ambulatory Visit: Payer: Self-pay

## 2021-03-19 ENCOUNTER — Ambulatory Visit (HOSPITAL_COMMUNITY)
Admission: EM | Admit: 2021-03-19 | Discharge: 2021-03-19 | Disposition: A | Discharge status: Home / Self Care | Payer: Medicare Other

## 2021-03-19 ENCOUNTER — Telehealth: Payer: Self-pay

## 2021-03-19 DIAGNOSIS — M62838 Other muscle spasm: Secondary | ICD-10-CM | POA: Diagnosis not present

## 2021-03-19 DIAGNOSIS — S76012A Strain of muscle, fascia and tendon of left hip, initial encounter: Secondary | ICD-10-CM

## 2021-03-19 NOTE — ED Triage Notes (Signed)
Pt reports pain on Lt upper leg with out injury since Sunday.

## 2021-03-19 NOTE — ED Provider Notes (Signed)
[place]    CSN: [id number] Arrival date & time: 03/19/21  1707      History   Chief Complaint Chief Complaint  Patient presents with   Leg Pain    HPI Melody [name] is a 45 y.o. female.   2 days ago, she was leaving work and started to notice pain in her left posterior leg She reports that it has progressively worsening since then and is now radiating from her left buttocks into her left thigh She also now has pain in her left groin She reports waking from sleep when rolling on her left arm She denies changes in bowel or bladder habits, saddle anesthesia, fever, numbness/tingling No prior injury She denies taking anything for the pain She states that she has not had this pain previously She works in housekeeping at a hotel  Patient is status post renal transplant and is on chronic prednisone and immunosuppression therapy   Past Medical History:  Diagnosis Date   Alcohol abuse    Anemia    Chronic kidney disease    staqge 5 M/W/F Dialysis   Drug abuse (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   Multiple thyroid nodules 05/04/2018   Cervical radiculopathy 05/04/2018   Former tobacco use 09/29/2017   Pre-transplant evaluation for kidney transplant 02/12/2017   Drug abuse [facility])    Essential hypertension     Past Surgical History:  Procedure Laterality Date   AV FISTULA PLACEMENT Right 09/15/2016   Procedure: ARTERIOVENOUS (AV) FISTULA CREATION UPPER RIGHT ARM;  Surgeon: Rosetta Posner, MD;  Location: [place];  Service: Vascular;  Laterality: Right;   AV FISTULA  PLACEMENT Left 12/30/2016   Procedure: ARTERIOVENOUS (AV) FISTULA CREATION USING GORETEX STRETCH GRAFT;  Surgeon: Elam Dutch, MD;  Location: [facility] OR;  Service: Vascular;  Laterality: Left;   AV FISTULA PLACEMENT Left 06/15/2017   Procedure: INSERTION OF ARTERIOVENOUS (AV) GORE-TEX GRAFT 6MM X 40CM LEFT ARM;  Surgeon: Rosetta Posner, MD;  Location: [place];  Service: Vascular;  Laterality: Left;   AV FISTULA PLACEMENT Right 08/05/2018   Procedure: Insertion of Right arm gortex graft;  Surgeon: Rosetta Posner, MD;  Location: [facility] OR;  Service: Vascular;  Laterality: Right;   DILATION AND CURETTAGE OF UTERUS     INSERTION OF DIALYSIS CATHETER Right 09/15/2016   Procedure: INSERTION OF DIALYSIS CATHETER ON RIGHT SIDE;  Surgeon: Rosetta Posner, MD;  Location: [facility] OR;  Service: Vascular;  Laterality: Right;   KIDNEY TRANSPLANT Right 02/18/2019    OB History   No obstetric history on file.      Home Medications    Prior to Admission medications   Medication Sig Start Date End Date Taking? Authorizing Provider  acetaminophen (TYLENOL) 500 MG tablet Take 1,000 mg by mouth every 6 (six) hours as needed for mild pain or headache.    [provider]  amLODipine (NORVASC) 10 MG tablet Take 1 tablet (10 mg total) by mouth daily. 11/23/20   Ladell Pier, MD  cinacalcet (SENSIPAR) 30 MG tablet Take 30  mg by mouth daily. 03/22/20   [provider]  cyclobenzaprine (FLEXERIL) 10 MG tablet Take 1 tablet (10 mg total) by mouth 2 (two) times daily as needed for muscle spasms. 02/03/21   Hughie Closs, PA-C  ENVARSUS XR 1 MG TB24 Take 6 mg by mouth daily.  02/08/20   [provider]  labetalol (NORMODYNE) 200 MG tablet Take 1 tablet (200 mg total) by mouth 2 (two) times daily. 06/12/20   Vanessa Kick, MD  lidocaine (LIDODERM) 5 % Place 1 patch onto the skin daily. Remove & Discard patch within 12 hours or as directed by MD Patient not taking: Reported on 01/04/2021 08/08/20   Joy,  Raquel Sarna C, PA-C  lidocaine-prilocaine (EMLA) cream Apply 1 application topically daily as needed for irritation. Patient not taking: Reported on 01/04/2021 04/23/17   [provider]  mycophenolate (MYFORTIC) 180 MG EC tablet Take 540 mg by mouth 2 (two) times daily. 02/20/20   [provider]  omeprazole (PRILOSEC) 20 MG capsule Take 20 mg by mouth 2 (two) times daily. 03/22/20   [provider]  predniSONE (DELTASONE) 20 MG tablet Take 2 tablets (40 mg total) by mouth daily. 06/12/20   Vanessa Kick, MD    Family History Family History  Problem Relation Age of Onset   CAD Mother    Hypertension Mother    Heart disease Mother    Kidney failure Mother    Cirrhosis Father    CAD Brother    Hypertension Other    Kidney failure Other    Hypertension Maternal Grandmother    Kidney failure Maternal Grandmother    Congestive Heart Failure Maternal Grandmother    Breast cancer Sister    Thyroid disease Neg Hx     Social History Social History   Tobacco Use   Smoking status: Former    Packs/day: 0.00    Years: 0.00    Pack years: 0.00    Types: Cigars, Cigarettes    Start date: 09/11/2016   Smokeless tobacco: Never   Tobacco comments:    pt stopped smoking a couple months ago  Vaping Use   Vaping Use: Never used  Substance Use Topics   Alcohol use: Yes    Alcohol/week: 0.0 standard drinks    Comment: occasionally   Drug use: Yes    Types: Marijuana    Comment: occasionally     Allergies   Aluminum chloride, Codeine, and Tramadol   Review of Systems Review of Systems  Constitutional:  Negative for activity change, appetite change, chills and fever.  Respiratory:  Negative for cough and shortness of breath.   Cardiovascular:  Negative for chest pain and leg swelling.  Genitourinary:  Negative for difficulty urinating.  Musculoskeletal:  Negative for back pain and neck pain.  Skin:  Negative for rash and wound.  Neurological:  Negative for  syncope, weakness and numbness.    Physical Exam Triage Vital Signs ED Triage Vitals  Enc Vitals Group     BP 03/19/21 1739 120/79     Pulse Rate 03/19/21 1739 70     Resp 03/19/21 1739 18     Temp 03/19/21 1739 99.6 F (37.6 C)     Temp src --      SpO2 03/19/21 1739 100 %     Weight --      Height --      Head Circumference --      Peak Flow --      Pain Score  03/19/21 1740 8     Pain Loc --      Pain Edu? --      Excl. in [place]? --    No data found.  Updated Vital Signs BP 120/79   Pulse 70   Temp 99.6 F (37.6 C)   Resp 18   SpO2 100%   Visual Acuity Right Eye Distance:   Left Eye Distance:   Bilateral Distance:    Right Eye Near:   Left Eye Near:    Bilateral Near:     Physical Exam Constitutional:      General: She is not in acute distress.    Appearance: Normal appearance. She is not ill-appearing.  HENT:     Head: Normocephalic and atraumatic.  Pulmonary:     Effort: Pulmonary effort is normal.     Breath sounds: Normal breath sounds.  Musculoskeletal:     Lumbar back: No edema, deformity or bony tenderness. Normal range of motion. Negative right straight leg raise test and negative left straight leg raise test.     Comments: TTP left piriformis region, no TTP hamstring on left Positive log roll on left TTP of left adductor insertion on pubis ramus Full ROM of left hip without pain  5/5 strength BLE Pain with resisted adduction of left leg 2+ DTRs patellar and achilles b/l  neurovascularly intact distally bilaterally  Skin:    General: Skin is warm and dry.  Neurological:     General: No focal deficit present.     Mental Status: She is alert and oriented to person, place, and time.     UC Treatments / Results  Labs (all labs ordered are listed, but only abnormal results are displayed) Labs Reviewed - No data to display  EKG   Radiology No results found.  Procedures Procedures (including critical care time)  Medications Ordered  in UC Medications - No data to display  Initial Impression / Assessment and Plan / UC Course  I have reviewed the triage vital signs and the nursing notes.  Pertinent labs & imaging results that were available during my care of the patient were reviewed by me and considered in my medical decision making (see chart for details).     Patient is a 45 year old female with history of renal transplant who presents with left posterior leg and buttocks pain that started 2 days ago.  She has no red flag symptoms for back pain.  She is neurologically intact on exam.  Her tenderness and pain mostly seem to be in the region of her piriformis and likely caused by impingement on her sciatic nerve.  Discussed with patient that her pain could be radiating from her back given the location.  She does not have any reason for urgent imaging today.  She also seems to be having some pain in her abductors which could be from a change in her gait related to the pain that she is experiencing in her buttocks.  We are rather limited on oral medications for her given she is currently on chronic prednisone for her renal transplant and she is not allowed to take NSAIDs.  Given this, advised that she can use topical Voltaren gel in the areas of her pain and Tylenol extra strength tablets.  She was given ED precautions for signs of cauda equina and advised to return immediately if these occur.  Advised her that if she does not have improvement over the next few days that she should be seen by  an orthopedic doctor for further evaluation and consideration of further back imaging and ultimately injections if required.  She was discharged home in stable condition.   Final Clinical Impressions(s) / UC Diagnoses   Final diagnoses:  Spasm of left piriformis muscle  Strain of left hip adductor muscle, initial encounter     Discharge Instructions      You likely have pain that is coming for your back which could be from a pinched  nerve.  Since you cannot take anti-pancreatitis, you should use a topical anti-inflammatory called Voltaren gel that you can get at the pharmacy.  You can read this in the areas of your pain.  You can also take extra strength Tylenol.  Pain.  If you are not having improvement over the next few days, you should be seen by an orthopedic provider.  Reasons to go to the emergency room include significant worsening in your pain, numbness and tingling in your groin, extreme weakness in her leg or loss of function of your leg or foot, difficulty urinating, stooling on yourself, fevers.     ED Prescriptions   None    PDMP not reviewed this encounter.   Cleophas Dunker, DO 03/19/21 1836

## 2021-03-19 NOTE — Discharge Instructions (Signed)
You likely have pain that is coming for your back which could be from a pinched nerve.  Since you cannot take anti-pancreatitis, you should use a topical anti-inflammatory called Voltaren gel that you can get at the pharmacy.  You can read this in the areas of your pain.  You can also take extra strength Tylenol.  Pain.  If you are not having improvement over the next few days, you should be seen by an orthopedic provider.  Reasons to go to the emergency room include significant worsening in your pain, numbness and tingling in your groin, extreme weakness in her leg or loss of function of your leg or foot, difficulty urinating, stooling on yourself, fevers.

## 2021-03-19 NOTE — Telephone Encounter (Signed)
Contacted pt to go over mm results pt is aware and doesn't have any questions or concerns  

## 2021-03-21 DIAGNOSIS — M898X5 Other specified disorders of bone, thigh: Secondary | ICD-10-CM | POA: Diagnosis not present

## 2021-03-21 DIAGNOSIS — M5442 Lumbago with sciatica, left side: Secondary | ICD-10-CM | POA: Diagnosis not present

## 2021-03-28 DIAGNOSIS — Z20822 Contact with and (suspected) exposure to covid-19: Secondary | ICD-10-CM | POA: Diagnosis not present

## 2021-06-25 DIAGNOSIS — D631 Anemia in chronic kidney disease: Secondary | ICD-10-CM | POA: Diagnosis not present

## 2021-06-25 DIAGNOSIS — Z8616 Personal history of COVID-19: Secondary | ICD-10-CM | POA: Diagnosis not present

## 2021-06-25 DIAGNOSIS — B259 Cytomegaloviral disease, unspecified: Secondary | ICD-10-CM | POA: Diagnosis not present

## 2021-06-25 DIAGNOSIS — Z940 Kidney transplant status: Secondary | ICD-10-CM | POA: Diagnosis not present

## 2021-06-25 DIAGNOSIS — D849 Immunodeficiency, unspecified: Secondary | ICD-10-CM | POA: Diagnosis not present

## 2021-06-25 DIAGNOSIS — E785 Hyperlipidemia, unspecified: Secondary | ICD-10-CM | POA: Diagnosis not present

## 2021-06-25 DIAGNOSIS — I129 Hypertensive chronic kidney disease with stage 1 through stage 4 chronic kidney disease, or unspecified chronic kidney disease: Secondary | ICD-10-CM | POA: Diagnosis not present

## 2021-06-25 DIAGNOSIS — N2581 Secondary hyperparathyroidism of renal origin: Secondary | ICD-10-CM | POA: Diagnosis not present

## 2021-06-25 DIAGNOSIS — N189 Chronic kidney disease, unspecified: Secondary | ICD-10-CM | POA: Diagnosis not present

## 2021-06-27 ENCOUNTER — Telehealth: Payer: Self-pay | Admitting: Internal Medicine

## 2021-06-27 NOTE — Telephone Encounter (Signed)
Pt is calling to request a letter from Dr. Wynetta Emery stating that she has a hx of high BP. Patient had CPE at work and her BP was elevated. Her BP 155/97 before she took her medication today. Please notify pt when letter is ready. She will come pick up the letter. [phone]

## 2021-06-28 NOTE — Telephone Encounter (Signed)
Will forward to provider  

## 2021-07-10 ENCOUNTER — Encounter: Payer: Self-pay | Admitting: Internal Medicine

## 2021-07-10 NOTE — Progress Notes (Signed)
Patient seen by Dr. Marval Regal 07-23-2021. Deceased donor kidney transplant: Creatinine 1.06, GFR 65, LDL 126 BK V DNA negative EBV DNA negative CMV DNA negative Currently on triple therapy: envarsus 6 mg daily, Myfortic 540 mg twice a day, prednisone 5 mg daily Anemia of renal disease: Ferritin 1161, iron 89, iron saturation 45%, TIBC 200

## 2021-08-12 ENCOUNTER — Telehealth: Payer: Self-pay | Admitting: Internal Medicine

## 2021-08-12 NOTE — Telephone Encounter (Signed)
Medication Refill - Medication: Labetalol   Has the patient contacted their pharmacy? Yes.   Pt states that the pharmacy does not have this on file. Pt states that she is no longer seeing the provider who originally prescribed this for her. Pt states that she is completely out of this medication. Please advise.  (Agent: If no, request that the patient contact the pharmacy for the refill. If patient does not wish to contact the pharmacy document the reason why and proceed with request.) (Agent: If yes, when and what did the pharmacy advise?)  Preferred Pharmacy (with phone number or street name):  [facility] [phone] - [address] [place], [address]  [place] [address]  Phone: [phone] Fax: [phone]  Hours: Not open 24 hours   Has the patient been seen for an appointment in the last year OR does the patient have an upcoming appointment? Yes.    Agent: Please be advised that RX refills may take up to 3 business days. We ask that you follow-up with your pharmacy.

## 2021-08-13 NOTE — Telephone Encounter (Signed)
Requested medications are due for refill today.  unsure  Requested medications are on the active medications list.  yes  Last refill. 06/12/2020  Future visit scheduled.   no  Notes to clinic.  Rx filled by Vanessa Kick.    Requested Prescriptions  Pending Prescriptions Disp Refills   labetalol (NORMODYNE) 200 MG tablet 60 tablet 1    Sig: Take 1 tablet (200 mg total) by mouth 2 (two) times daily.     Cardiovascular:  Beta Blockers Failed - 08/13/2021  3:53 PM      Failed - Valid encounter within last 6 months    Recent Outpatient Visits           7 months ago Annual physical exam   [place] Ladell Pier, MD   8 months ago Essential hypertension   [place], Deborah B, MD   3 years ago Essential hypertension   [place], MD   3 years ago Essential hypertension   [place], MD   3 years ago Essential hypertension   [place] Ladell Pier, MD              Passed - Last BP in normal range    BP Readings from Last 1 Encounters:  03/19/21 120/79          Passed - Last Heart Rate in normal range    Pulse Readings from Last 1 Encounters:  03/19/21 70

## 2021-08-15 NOTE — Telephone Encounter (Signed)
Pt calling back requesting an update. Stated she has now been out of her medication for an entire week.   Please advise.

## 2021-08-15 NOTE — Telephone Encounter (Signed)
Patient has not been since 12/2020 and this rx was not written by one of our providers. Will have to forward to PCP for review.

## 2021-08-16 ENCOUNTER — Other Ambulatory Visit: Payer: Self-pay | Admitting: Internal Medicine

## 2021-08-16 MED ORDER — LABETALOL HCL 200 MG PO TABS
200.0000 mg | ORAL_TABLET | Freq: Two times a day (BID) | ORAL | 1 refills | Status: DC
Start: 2021-08-16 — End: 2021-12-01

## 2021-08-16 NOTE — Addendum Note (Signed)
Addended by: Karle Plumber B on: 08/16/2021 09:34 AM   Modules accepted: Orders

## 2021-08-16 NOTE — Telephone Encounter (Signed)
patient needs appt . Courtesy refill signed today #[phone]. Requested Prescriptions  Refused Prescriptions Disp Refills   labetalol (NORMODYNE) 200 MG tablet [Pharmacy Med Name: LABETALOL 200MG  TABLETS] 180 tablet     Sig: TAKE 1 TABLET(200 MG) BY MOUTH TWICE DAILY     Cardiovascular:  Beta Blockers Failed - 08/16/2021  9:34 AM      Failed - Valid encounter within last 6 months    Recent Outpatient Visits          7 months ago Annual physical exam   [place] Ladell Pier, MD   8 months ago Essential hypertension   [place], MD   3 years ago Essential hypertension   [place], Deborah B, MD   3 years ago Essential hypertension   [place], MD   3 years ago Essential hypertension   [place] Ladell Pier, MD             Passed - Last BP in normal range    BP Readings from Last 1 Encounters:  03/19/21 120/79         Passed - Last Heart Rate in normal range    Pulse Readings from Last 1 Encounters:  03/19/21 70

## 2021-08-16 NOTE — Telephone Encounter (Signed)
Patient notified

## 2021-09-03 DIAGNOSIS — Z7952 Long term (current) use of systemic steroids: Secondary | ICD-10-CM | POA: Diagnosis not present

## 2021-09-03 DIAGNOSIS — G4700 Insomnia, unspecified: Secondary | ICD-10-CM | POA: Diagnosis not present

## 2021-09-03 DIAGNOSIS — Z5181 Encounter for therapeutic drug level monitoring: Secondary | ICD-10-CM | POA: Diagnosis not present

## 2021-09-03 DIAGNOSIS — E139 Other specified diabetes mellitus without complications: Secondary | ICD-10-CM | POA: Diagnosis not present

## 2021-09-03 DIAGNOSIS — Z23 Encounter for immunization: Secondary | ICD-10-CM | POA: Diagnosis not present

## 2021-09-03 DIAGNOSIS — Z8619 Personal history of other infectious and parasitic diseases: Secondary | ICD-10-CM | POA: Diagnosis not present

## 2021-09-03 DIAGNOSIS — Z79621 Long term (current) use of calcineurin inhibitor: Secondary | ICD-10-CM | POA: Diagnosis not present

## 2021-09-03 DIAGNOSIS — D649 Anemia, unspecified: Secondary | ICD-10-CM | POA: Diagnosis not present

## 2021-09-03 DIAGNOSIS — I10 Essential (primary) hypertension: Secondary | ICD-10-CM | POA: Diagnosis not present

## 2021-09-03 DIAGNOSIS — D849 Immunodeficiency, unspecified: Secondary | ICD-10-CM | POA: Diagnosis not present

## 2021-09-03 DIAGNOSIS — E785 Hyperlipidemia, unspecified: Secondary | ICD-10-CM | POA: Diagnosis not present

## 2021-09-03 DIAGNOSIS — Z792 Long term (current) use of antibiotics: Secondary | ICD-10-CM | POA: Diagnosis not present

## 2021-09-03 DIAGNOSIS — Z8616 Personal history of COVID-19: Secondary | ICD-10-CM | POA: Diagnosis not present

## 2021-09-03 DIAGNOSIS — Z940 Kidney transplant status: Secondary | ICD-10-CM | POA: Diagnosis not present

## 2021-09-03 DIAGNOSIS — R112 Nausea with vomiting, unspecified: Secondary | ICD-10-CM | POA: Diagnosis not present

## 2021-09-03 DIAGNOSIS — Z79899 Other long term (current) drug therapy: Secondary | ICD-10-CM | POA: Diagnosis not present

## 2021-09-03 DIAGNOSIS — Z4822 Encounter for aftercare following kidney transplant: Secondary | ICD-10-CM | POA: Diagnosis not present

## 2021-09-10 DIAGNOSIS — E139 Other specified diabetes mellitus without complications: Secondary | ICD-10-CM | POA: Insufficient documentation

## 2021-10-08 DIAGNOSIS — N189 Chronic kidney disease, unspecified: Secondary | ICD-10-CM | POA: Diagnosis not present

## 2021-10-08 DIAGNOSIS — Z940 Kidney transplant status: Secondary | ICD-10-CM | POA: Diagnosis not present

## 2021-10-22 ENCOUNTER — Other Ambulatory Visit: Payer: Self-pay | Admitting: Internal Medicine

## 2021-10-22 DIAGNOSIS — Z1231 Encounter for screening mammogram for malignant neoplasm of breast: Secondary | ICD-10-CM

## 2021-10-31 DIAGNOSIS — D849 Immunodeficiency, unspecified: Secondary | ICD-10-CM | POA: Diagnosis not present

## 2021-10-31 DIAGNOSIS — B259 Cytomegaloviral disease, unspecified: Secondary | ICD-10-CM | POA: Diagnosis not present

## 2021-10-31 DIAGNOSIS — D631 Anemia in chronic kidney disease: Secondary | ICD-10-CM | POA: Diagnosis not present

## 2021-10-31 DIAGNOSIS — Z940 Kidney transplant status: Secondary | ICD-10-CM | POA: Diagnosis not present

## 2021-10-31 DIAGNOSIS — N189 Chronic kidney disease, unspecified: Secondary | ICD-10-CM | POA: Diagnosis not present

## 2021-10-31 DIAGNOSIS — N2581 Secondary hyperparathyroidism of renal origin: Secondary | ICD-10-CM | POA: Diagnosis not present

## 2021-10-31 DIAGNOSIS — I129 Hypertensive chronic kidney disease with stage 1 through stage 4 chronic kidney disease, or unspecified chronic kidney disease: Secondary | ICD-10-CM | POA: Diagnosis not present

## 2021-10-31 DIAGNOSIS — E785 Hyperlipidemia, unspecified: Secondary | ICD-10-CM | POA: Diagnosis not present

## 2021-10-31 DIAGNOSIS — Z8616 Personal history of COVID-19: Secondary | ICD-10-CM | POA: Diagnosis not present

## 2021-11-29 ENCOUNTER — Other Ambulatory Visit: Payer: Self-pay | Admitting: Internal Medicine

## 2021-12-01 ENCOUNTER — Other Ambulatory Visit: Payer: Self-pay | Admitting: Internal Medicine

## 2021-12-03 DIAGNOSIS — Z940 Kidney transplant status: Secondary | ICD-10-CM | POA: Diagnosis not present

## 2021-12-26 ENCOUNTER — Ambulatory Visit: Payer: Medicare Other | Attending: Internal Medicine | Admitting: Internal Medicine

## 2021-12-26 ENCOUNTER — Encounter: Payer: Self-pay | Admitting: Internal Medicine

## 2021-12-26 VITALS — BP 181/123 | HR 72 | Temp 98.1°F | Resp 16 | Ht 66.0 in | Wt 166.4 lb

## 2021-12-26 DIAGNOSIS — I10 Essential (primary) hypertension: Secondary | ICD-10-CM | POA: Diagnosis not present

## 2021-12-26 DIAGNOSIS — E782 Mixed hyperlipidemia: Secondary | ICD-10-CM

## 2021-12-26 DIAGNOSIS — D849 Immunodeficiency, unspecified: Secondary | ICD-10-CM

## 2021-12-26 DIAGNOSIS — Z1211 Encounter for screening for malignant neoplasm of colon: Secondary | ICD-10-CM | POA: Diagnosis not present

## 2021-12-26 MED ORDER — LABETALOL HCL 200 MG PO TABS: 200.0000 mg | ORAL_TABLET | Freq: Two times a day (BID) | ORAL | 6 refills | Status: AC

## 2021-12-26 MED ORDER — AMLODIPINE BESYLATE 10 MG PO TABS: 10.0000 mg | ORAL_TABLET | Freq: Every day | ORAL | 6 refills | Status: AC

## 2021-12-26 NOTE — Progress Notes (Signed)
? ? ?Patient ID: Melody [name], female    DOB: [birth year]  MRN: [id number] ? ?CC: Medication Refill ? ? ?Subjective: ?Melody [name] is a 46 y.o. female who presents for chronic ds management ?Her concerns today include:  ?Pt with hx of HTN, former tob dep, ESRD s/p transplant, ACD, chronic diastolic CHF.  ? ?HTN:  BP elev today ?Should be on Norvasc 10 mg, Labetalol 200 mg BID.  Reports taking meds but out of Norvasc x 2 wks ?Limits salt in foods ?Not checking BP ?No CP, occasionally SOB, no LE edema.  Had a HA yesterday ? ?ESRD s/p transplant: sees transplant team at [facility] once a yr.  Last seen 08/2021.  Locally sees Dr. Vanita Panda Q 3 mths.  Still on prednisone but dose is now at 5 mg daily.  Also still on Envarsus, Myfortic and Sensipar ?I have reviewed recent labs done through [facility] transplant clinic 08/2021.  CBC was normal.  Chemistry revealed normal sodium and potassium, creatinine 0.98, BUN of 15 and GFR of 72 ?HL:  taking Pravachol as prescribe.  Had recent lipid profile done at [facility] 08/2021.  I have reviewed the results on care everywhere.  LDL cholesterol was 129.  Out of pravastatin at that time. ? ?HM:  Referred for c-scope last yr.  She was called by [facility] gastroenterology but reported telling them that she was going to have it done at [facility].  She has not had a colonoscopy.  Due for Pap smear. ?Patient Active Problem List  ? Diagnosis Date Noted  ? Cannabis use, unspecified with other cannabis-induced disorder ([place]) 01/04/2021  ? Kidney transplant recipient 11/23/2020  ? COVID-19 04/03/2020  ? Intractable nausea and vomiting 04/02/2020  ? 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 Fluid collection at surgical site 05/10/2019  ? Acute cystitis without hematuria 03/29/2019  ? Immunosuppressive management encounter following kidney transplant 03/29/2019  ? Bacterial infection due to H. pylori 03/02/2019  ?  Immunosuppression ([employer]) 02/23/2019  ? Acute bursitis of left shoulder 05/18/2018  ? 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 Multiple thyroid nodules 05/04/2018  ? Cervical radiculopathy 05/04/2018  ? Former tobacco use 09/29/2017  ? Pre-transplant evaluation for kidney transplant 02/12/2017  ? Drug abuse ([place])   ? Essential hypertension   ?  ? ?Current Outpatient Medications on File Prior to Visit  ?Medication Sig Dispense Refill  ? acetaminophen (TYLENOL) 500 MG tablet Take 1,000 mg by mouth every 6 (six) hours as needed for mild pain or headache.    ? cinacalcet (SENSIPAR) 30 MG tablet Take 30 mg by mouth daily.    ? ENVARSUS XR 1 MG TB24 Take 6 mg by mouth daily.     ? mycophenolate (MYFORTIC) 180 MG EC tablet Take 540 mg by mouth 2 (two) times daily.    ? omeprazole (PRILOSEC) 20 MG capsule Take 20 mg by mouth 2 (two) times daily.    ? pravastatin (PRAVACHOL) 20 MG tablet Take 1 tablet by mouth daily.    ? predniSONE (DELTASONE) 20 MG tablet Take 2 tablets (40 mg total) by mouth daily. 10 tablet 0  ? ?No current facility-administered medications on file prior to visit.  ? ? ?Allergies  ?Allergen Reactions  ? Aluminum Chloride Hives  ? Codeine Other (See Comments)  ?  Tylenol #3 causes nightmares  ? Tramadol Hives  ? ? ?Social History  ? ?Socioeconomic History  ? Marital status: Significant Other  ?  Spouse name: Not on file  ? Number of children: 2   ? Years of education: some college  ? Highest education level: Not on file  ?Occupational History  ? Occupation: Owns Education administrator business  ?Tobacco Use  ? Smoking status: Former  ?  Packs/day: 0.00  ?  Years: 0.00  ?  Pack years: 0.00  ?  Types: Cigars, Cigarettes  ?  Start date: 09/11/2016  ? Smokeless tobacco: Never  ? Tobacco comments:  ?  pt stopped smoking a couple months ago  ?Vaping Use  ? Vaping Use: Never used  ?Substance and Sexual Activity  ? Alcohol use: Yes  ?  Alcohol/week: 0.0 standard drinks  ?  Comment: occasionally  ? Drug use: Yes  ?  Types:  Marijuana  ?  Comment: occasionally  ? Sexual activity: Yes  ?  Birth control/protection: None  ?Other Topics Concern  ? Not on file  ?Social History Narrative  ? Looking for work   ? 24 and 13 yr son's.   ? 3 grandkids   ? ?Social Determinants of Health  ? ?Financial Resource Strain: Not on file  ?Food Insecurity: Not on file  ?Transportation Needs: Not on file  ?Physical Activity: Not on file  ?Stress: Not on file  ?Social Connections: Not on file  ?Intimate Partner Violence: Not on file  ? ? ?Family History  ?Problem Relation Age of Onset  ? CAD Mother   ? Hypertension Mother   ? Heart disease Mother   ? Kidney failure Mother   ? Cirrhosis Father   ? CAD Brother   ? Hypertension Other   ? Kidney failure Other   ? Hypertension Maternal Grandmother   ? Kidney failure Maternal Grandmother   ? Congestive Heart Failure Maternal Grandmother   ? Breast cancer Sister   ? Thyroid disease Neg Hx   ? ? ?Past Surgical History:  ?Procedure Laterality Date  ? AV FISTULA PLACEMENT Right 09/15/2016  ? Procedure: ARTERIOVENOUS (AV) FISTULA CREATION UPPER RIGHT ARM;  Surgeon: Rosetta Posner, MD;  Location: [place];  Service: Vascular;  Laterality: Right;  ? AV FISTULA PLACEMENT Left 12/30/2016  ? Procedure: ARTERIOVENOUS (AV) FISTULA CREATION USING GORETEX STRETCH GRAFT;  Surgeon: Elam Dutch, MD;  Location: [place];  Service: Vascular;  Laterality: Left;  ? AV FISTULA PLACEMENT Left 06/15/2017  ? Procedure: INSERTION OF ARTERIOVENOUS (AV) GORE-TEX GRAFT 6MM X 40CM LEFT ARM;  Surgeon: Rosetta Posner, MD;  Location: [place];  Service: Vascular;  Laterality: Left;  ? AV FISTULA PLACEMENT Right 08/05/2018  ? Procedure: Insertion of Right arm gortex graft;  Surgeon: Rosetta Posner, MD;  Location: [place];  Service: Vascular;  Laterality: Right;  ? DILATION AND CURETTAGE OF UTERUS    ? INSERTION OF DIALYSIS CATHETER Right 09/15/2016  ? Procedure: INSERTION OF DIALYSIS CATHETER ON RIGHT SIDE;  Surgeon: Rosetta Posner, MD;  Location: [place];   Service: Vascular;  Laterality: Right;  ? KIDNEY TRANSPLANT Right 02/18/2019  ? ? ?ROS: ?Review of Systems ?Negative except as stated above ? ?PHYSICAL EXAM: ?BP (!) 181/123   Pulse 72   Temp 98.1 ?F (36.7 ?C) (Oral)   Resp 16   Ht '5\' 6"'$  (1.676 m)   Wt 166 lb 6.4 oz (75.5 kg)   SpO2 100%   BMI 26.86 kg/m?   ?Physical Exam ? ?General appearance - alert, well appearing, middle-age African-American female and in no distress ?Mental status - normal mood, behavior, speech, dress,  motor activity, and thought processes ?Chest - clear to auscultation, no wheezes, rales or rhonchi, symmetric air entry ?Heart - normal rate, regular rhythm, normal S1, S2, no murmurs, rubs, clicks or gallops ?Extremities - peripheral pulses normal, no pedal edema, no clubbing or cyanosis ? ? ? ?  Latest Ref Rng & Units 08/08/2020  ?  8:57 PM 04/06/2020  ?  4:32 AM 04/05/2020  ?  8:50 AM  ?CMP  ?Glucose 70 - 99 mg/dL 87   189   149    ?BUN 6 - 20 mg/dL '15   24   18    '$ ?Creatinine 0.44 - 1.00 mg/dL 1.12   1.35   1.22    ?Sodium 135 - 145 mmol/L 139   133   136    ?Potassium 3.5 - 5.1 mmol/L 3.8   3.6   3.7    ?Chloride 98 - 111 mmol/L 106   100   102    ?CO2 22 - 32 mmol/L '22   23   24    '$ ?Calcium 8.9 - 10.3 mg/dL 9.8   9.9   10.3    ?Total Protein 6.5 - 8.1 g/dL 7.9   6.0   6.7    ?Total Bilirubin 0.3 - 1.2 mg/dL 0.4   0.8   0.8    ?Alkaline Phos 38 - 126 U/L 74   43   49    ?AST 15 - 41 U/L '21   16   19    '$ ?ALT 0 - 44 U/L '18   19   22    '$ ? ?Lipid Panel  ?   ?Component Value Date/Time  ? CHOL 285 (H) 09/29/2017 1024  ? TRIG 89 09/29/2017 1024  ? HDL 91 09/29/2017 1024  ? CHOLHDL 3.1 09/29/2017 1024  ? CHOLHDL 2.5 01/15/2015 0245  ? VLDL 15 01/15/2015 0245  ? LDLCALC 176 (H) 09/29/2017 1024  ? ? ?CBC ?   ?Component Value Date/Time  ? WBC 8.9 08/08/2020 2057  ? RBC 4.65 08/08/2020 2057  ? HGB 14.7 08/08/2020 2057  ? HCT 44.5 08/08/2020 2057  ? PLT 234 08/08/2020 2057  ? MCV 95.7 08/08/2020 2057  ? MCH 31.6 08/08/2020 2057  ? MCHC 33.0  08/08/2020 2057  ? RDW 12.5 08/08/2020 2057  ? LYMPHSABS 1.2 08/08/2020 2057  ? MONOABS 0.6 08/08/2020 2057  ? EOSABS 0.1 08/08/2020 2057  ? BASOSABS 0.1 08/08/2020 2057  ? ? ?ASSESSMENT AND PLAN: ? ?1. Essential hypertension ?Not

## 2022-01-28 ENCOUNTER — Ambulatory Visit (INDEPENDENT_AMBULATORY_CARE_PROVIDER_SITE_OTHER): Payer: Medicare Other | Admitting: Podiatry

## 2022-01-28 DIAGNOSIS — B351 Tinea unguium: Secondary | ICD-10-CM | POA: Diagnosis not present

## 2022-01-28 NOTE — Patient Instructions (Signed)
Ask your transplant doctor if you would be able to take fluconazole 150mg weekly for 6 months (26 doses). If they think this would not be good for your kidney then we will use a topical medicaiton        

## 2022-02-03 DIAGNOSIS — R7303 Prediabetes: Secondary | ICD-10-CM | POA: Diagnosis not present

## 2022-02-03 DIAGNOSIS — D3102 Benign neoplasm of left conjunctiva: Secondary | ICD-10-CM | POA: Diagnosis not present

## 2022-02-03 DIAGNOSIS — D3101 Benign neoplasm of right conjunctiva: Secondary | ICD-10-CM | POA: Diagnosis not present

## 2022-02-03 NOTE — Progress Notes (Signed)
  Subjective:  Patient ID: Oneida Alar, female    DOB: [birth year],  MRN: [id number]  Chief Complaint  Patient presents with   Nail Problem    (np) bil great toenail fungus; dark spots underneath feet. right foot 4th toenail has come off    46 y.o. female presents with the above complaint. History confirmed with patient.   Objective:  Physical Exam: warm, good capillary refill, no trophic changes or ulcerative lesions, normal DP and PT pulses, normal sensory exam, onychomycosis, and fourth toenail was recently avulsed appears to be healing well. Assessment:   1. Onychomycosis      Plan:  Patient was evaluated and treated and all questions answered.  Discussed etiology and treatment options of onychomycosis including oral topical and laser therapy.  She has a history of a kidney transplant.  I do not think she would be a good candidate for terbinafine.  We discussed the option of pulsed fluconazole dosing and she will discuss with her transplant doctors if this would be reasonable.  If not we will plan to use Penlac.  She will let me know what they say and I will send her prescriptions once this is completed.  Return in about 6 months (around 07/30/2022) for follow up after nail fungus treatment.

## 2022-02-04 DIAGNOSIS — Z940 Kidney transplant status: Secondary | ICD-10-CM | POA: Diagnosis not present

## 2022-02-04 DIAGNOSIS — Z8616 Personal history of COVID-19: Secondary | ICD-10-CM | POA: Diagnosis not present

## 2022-02-04 DIAGNOSIS — N2581 Secondary hyperparathyroidism of renal origin: Secondary | ICD-10-CM | POA: Diagnosis not present

## 2022-02-04 DIAGNOSIS — D631 Anemia in chronic kidney disease: Secondary | ICD-10-CM | POA: Diagnosis not present

## 2022-02-04 DIAGNOSIS — N189 Chronic kidney disease, unspecified: Secondary | ICD-10-CM | POA: Diagnosis not present

## 2022-02-04 DIAGNOSIS — I129 Hypertensive chronic kidney disease with stage 1 through stage 4 chronic kidney disease, or unspecified chronic kidney disease: Secondary | ICD-10-CM | POA: Diagnosis not present

## 2022-02-04 DIAGNOSIS — E785 Hyperlipidemia, unspecified: Secondary | ICD-10-CM | POA: Diagnosis not present

## 2022-02-04 DIAGNOSIS — B259 Cytomegaloviral disease, unspecified: Secondary | ICD-10-CM | POA: Diagnosis not present

## 2022-02-20 ENCOUNTER — Ambulatory Visit: Payer: Medicare Other | Admitting: Internal Medicine

## 2022-03-17 ENCOUNTER — Ambulatory Visit
Admission: RE | Admit: 2022-03-17 | Discharge: 2022-03-17 | Disposition: A | Discharge status: Home / Self Care | Payer: Medicare Other | Source: Ambulatory Visit | Attending: Internal Medicine | Admitting: Internal Medicine

## 2022-03-17 DIAGNOSIS — Z1231 Encounter for screening mammogram for malignant neoplasm of breast: Secondary | ICD-10-CM

## 2022-07-29 ENCOUNTER — Ambulatory Visit: Payer: Medicare Other | Admitting: Podiatry

## 2022-08-14 ENCOUNTER — Ambulatory Visit (INDEPENDENT_AMBULATORY_CARE_PROVIDER_SITE_OTHER): Payer: Medicaid Other | Admitting: Podiatry

## 2022-08-14 DIAGNOSIS — B351 Tinea unguium: Secondary | ICD-10-CM | POA: Diagnosis not present

## 2022-08-14 MED ORDER — CICLOPIROX 8 % EX SOLN: Freq: Every day | CUTANEOUS | 0 refills | Status: DC

## 2022-08-14 NOTE — Progress Notes (Signed)
  Subjective:  Patient ID: Melody [name], female    DOB: 1976/05/15,  MRN: [id number]  Chief Complaint  Patient presents with   Nail Problem    6 month follow up nail fungus    46 y.o. female presents with the above complaint. History confirmed with patient.  She forgot to ask her transplant doctor about the medication  Objective:  Physical Exam: warm, good capillary refill, no trophic changes or ulcerative lesions, normal DP and PT pulses, normal sensory exam, onychomycosis of nearly all toenails  Assessment:   1. Onychomycosis      Plan:  Patient was evaluated and treated and all questions answered.  We again discussed pulsed fluconazole dosing she will ask her transplant team about this.  I recommend we go and start a topical treatment and Penlac Rx was sent to pharmacy.  We discussed its use.  I will see her back in 6 months for follow-up  Return in about 6 months (around 02/13/2023) for follow up after nail fungus treatment.

## 2022-08-14 NOTE — Patient Instructions (Signed)
Ask your transplant doctor if you would be able to take fluconazole '150mg'$  weekly for 6 months (26 doses). If they think this would not be good for your kidney then we will use a topical medicaiton

## 2022-09-24 ENCOUNTER — Encounter (HOSPITAL_COMMUNITY): Payer: Self-pay

## 2022-09-24 ENCOUNTER — Emergency Department (HOSPITAL_COMMUNITY): Payer: Medicaid Other

## 2022-09-24 ENCOUNTER — Other Ambulatory Visit: Payer: Self-pay

## 2022-09-24 ENCOUNTER — Emergency Department (HOSPITAL_COMMUNITY)
Admission: EM | Admit: 2022-09-24 | Discharge: 2022-09-24 | Disposition: A | Discharge status: Home / Self Care | Payer: Medicaid Other | Attending: Emergency Medicine | Admitting: Emergency Medicine

## 2022-09-24 DIAGNOSIS — F419 Anxiety disorder, unspecified: Secondary | ICD-10-CM | POA: Diagnosis not present

## 2022-09-24 DIAGNOSIS — R000 Tachycardia, unspecified: Secondary | ICD-10-CM | POA: Diagnosis not present

## 2022-09-24 DIAGNOSIS — I10 Essential (primary) hypertension: Secondary | ICD-10-CM | POA: Diagnosis not present

## 2022-09-24 DIAGNOSIS — Z79899 Other long term (current) drug therapy: Secondary | ICD-10-CM | POA: Insufficient documentation

## 2022-09-24 DIAGNOSIS — R1084 Generalized abdominal pain: Secondary | ICD-10-CM | POA: Insufficient documentation

## 2022-09-24 DIAGNOSIS — Z20822 Contact with and (suspected) exposure to covid-19: Secondary | ICD-10-CM | POA: Diagnosis not present

## 2022-09-24 DIAGNOSIS — J101 Influenza due to other identified influenza virus with other respiratory manifestations: Secondary | ICD-10-CM | POA: Diagnosis not present

## 2022-09-24 DIAGNOSIS — R197 Diarrhea, unspecified: Secondary | ICD-10-CM | POA: Diagnosis present

## 2022-09-24 LAB — COMPREHENSIVE METABOLIC PANEL
ALT: 24 U/L (ref 0–44)
AST: 21 U/L (ref 15–41)
Albumin: 4.7 g/dL (ref 3.5–5.0)
Alkaline Phosphatase: 70 U/L (ref 38–126)
Anion gap: 15 (ref 5–15)
BUN: 13 mg/dL (ref 6–20)
CO2: 15 mmol/L — ABNORMAL LOW (ref 22–32)
Calcium: 10.8 mg/dL — ABNORMAL HIGH (ref 8.9–10.3)
Chloride: 109 mmol/L (ref 98–111)
Creatinine, Ser: 1.13 mg/dL — ABNORMAL HIGH (ref 0.44–1.00)
GFR, Estimated: >60 mL/min (ref >60)
Glucose, Bld: 192 mg/dL — ABNORMAL HIGH (ref 70–99)
Potassium: 3.9 mmol/L (ref 3.5–5.1)
Sodium: 139 mmol/L (ref 135–145)
Total Bilirubin: 0.8 mg/dL (ref 0.3–1.2)
Total Protein: 8 g/dL (ref 6.5–8.1)

## 2022-09-24 LAB — CBC
HCT: 48.6 % — ABNORMAL HIGH (ref 36.0–46.0)
Hemoglobin: 16.8 g/dL — ABNORMAL HIGH (ref 12.0–15.0)
MCH: 31.4 pg (ref 26.0–34.0)
MCHC: 34.6 g/dL (ref 30.0–36.0)
MCV: 90.8 fL (ref 80.0–100.0)
Platelets: 210 10*3/uL (ref 150–400)
RBC: 5.35 MIL/uL — ABNORMAL HIGH (ref 3.87–5.11)
RDW: 12.8 % (ref 11.5–15.5)
WBC: 12.4 10*3/uL — ABNORMAL HIGH (ref 4.0–10.5)
nRBC: 0 % (ref 0.0–0.2)

## 2022-09-24 LAB — RESP PANEL BY RT-PCR (RSV, FLU A&B, COVID)  RVPGX2
Influenza A by PCR: POSITIVE — AB
Influenza B by PCR: NEGATIVE
Resp Syncytial Virus by PCR: NEGATIVE
SARS Coronavirus 2 by RT PCR: NEGATIVE

## 2022-09-24 LAB — LACTIC ACID, PLASMA
Lactic Acid, Venous: 1.5 mmol/L (ref 0.5–1.9)
Lactic Acid, Venous: 0.9 mmol/L (ref 0.5–1.9)

## 2022-09-24 LAB — I-STAT BETA HCG BLOOD, ED (MC, WL, AP ONLY): I-stat hCG, quantitative: 8.4 m[IU]/mL — ABNORMAL HIGH (ref <5)

## 2022-09-24 LAB — LIPASE, BLOOD: Lipase: 30 U/L (ref 11–51)

## 2022-09-24 MED ORDER — ONDANSETRON 4 MG PO TBDP
4.0000 mg | ORAL_TABLET | Freq: Once | ORAL | Status: AC
  Administered 2022-09-24: 4 mg via ORAL
  Filled 2022-09-24: qty 1

## 2022-09-24 MED ORDER — LACTATED RINGERS IV SOLN: INTRAVENOUS | Status: DC

## 2022-09-24 MED ORDER — ACETAMINOPHEN 325 MG PO TABS
650.0000 mg | ORAL_TABLET | Freq: Once | ORAL | Status: AC
  Administered 2022-09-24: 650 mg via ORAL
  Filled 2022-09-24: qty 2

## 2022-09-24 MED ORDER — FENTANYL CITRATE PF 50 MCG/ML IJ SOSY
50.0000 ug | PREFILLED_SYRINGE | Freq: Once | INTRAMUSCULAR | Status: AC
  Administered 2022-09-24: 50 ug via INTRAVENOUS
  Filled 2022-09-24: qty 1

## 2022-09-24 MED ORDER — OXYCODONE-ACETAMINOPHEN 5-325 MG PO TABS
1.0000 | ORAL_TABLET | Freq: Once | ORAL | Status: AC
  Administered 2022-09-24: 1 via ORAL
  Filled 2022-09-24: qty 1

## 2022-09-24 MED ORDER — DROPERIDOL 2.5 MG/ML IJ SOLN
1.2500 mg | Freq: Once | INTRAMUSCULAR | Status: AC
  Administered 2022-09-24: 1.25 mg via INTRAVENOUS
  Filled 2022-09-24: qty 2

## 2022-09-24 MED ORDER — LACTATED RINGERS IV BOLUS
1000.0000 mL | Freq: Once | INTRAVENOUS | Status: AC
  Administered 2022-09-24: 1000 mL via INTRAVENOUS

## 2022-09-24 NOTE — ED Provider Notes (Signed)
[facility] Provider Note   CSN: [id number] Arrival date & time: 09/24/22  7341     History  Chief Complaint  Patient presents with   Abdominal Pain    Melody [name] is a 47 y.o. female.  47 year old female presents with several days of nausea vomiting diarrhea.  Patient does endorse occasional alcohol use as well as daily marijuana use.  Has a history of renal transplant secondary to hypertension.  Notes compliance with her medications.  States that her symptoms started with nonbilious emesis.  Slight diarrhea.  Some muscle aches.  No reported fever.  Pain is epigastric in nature.  Patient cannot keep down her transplant medications.  No urinary symptoms       Home Medications Prior to Admission medications   Medication Sig Start Date End Date Taking? Authorizing Provider  acetaminophen (TYLENOL) 500 MG tablet Take 1,000 mg by mouth every 6 (six) hours as needed for mild pain or headache.    [provider]  amLODipine (NORVASC) 10 MG tablet Take 1 tablet (10 mg total) by mouth daily. 12/26/21   Ladell Pier, MD  ciclopirox (PENLAC) 8 % solution Apply topically at bedtime. Apply over nail and surrounding skin. Apply daily over previous coat. After seven (7) days, may remove with alcohol and continue cycle. 08/14/22   Criselda Peaches, DPM  cinacalcet (SENSIPAR) 30 MG tablet Take 30 mg by mouth daily. 03/22/20   [provider]  ENVARSUS XR 1 MG TB24 Take 6 mg by mouth daily.  02/08/20   [provider]  labetalol (NORMODYNE) 200 MG tablet Take 1 tablet (200 mg total) by mouth 2 (two) times daily. 12/26/21   Ladell Pier, MD  mycophenolate (MYFORTIC) 180 MG EC tablet Take 540 mg by mouth 2 (two) times daily. 02/20/20   [provider]  omeprazole (PRILOSEC) 20 MG capsule Take 20 mg by mouth 2 (two) times daily. 03/22/20   [provider]  pravastatin (PRAVACHOL) 20 MG tablet Take  1 tablet by mouth daily. 09/30/21   [provider]  predniSONE (DELTASONE) 20 MG tablet Take 2 tablets (40 mg total) by mouth daily. 06/12/20   Vanessa Kick, MD      Allergies    Aluminum chloride, Codeine, and Tramadol    Review of Systems   Review of Systems  All other systems reviewed and are negative.   Physical Exam Updated Vital Signs BP (!) 150/115   Pulse (!) 108   Temp 99.1 F (37.3 C) (Oral)   Resp 18   Ht 1.702 m ('5\' 7"'$ )   Wt 77.1 kg   SpO2 100%   BMI 26.63 kg/m  Physical Exam Vitals and nursing note reviewed.  Constitutional:      General: She is not in acute distress.    Appearance: Normal appearance. She is well-developed. She is not toxic-appearing.  HENT:     Head: Normocephalic and atraumatic.  Eyes:     General: Lids are normal.     Conjunctiva/sclera: Conjunctivae normal.     Pupils: Pupils are equal, round, and reactive to light.  Neck:     Thyroid: No thyroid mass.     Trachea: No tracheal deviation.  Cardiovascular:     Rate and Rhythm: Regular rhythm. Tachycardia present.     Heart sounds: Normal heart sounds. No murmur heard.    No gallop.  Pulmonary:     Effort: Pulmonary effort is normal. No respiratory  distress.     Breath sounds: Normal breath sounds. No stridor. No decreased breath sounds, wheezing, rhonchi or rales.  Abdominal:     General: There is no distension.     Palpations: Abdomen is soft.     Tenderness: There is generalized abdominal tenderness. There is no guarding or rebound.  Musculoskeletal:        General: No tenderness. Normal range of motion.     Cervical back: Normal range of motion and neck supple.  Skin:    General: Skin is warm and dry.     Findings: No abrasion or rash.  Neurological:     Mental Status: She is alert and oriented to person, place, and time. Mental status is at baseline.     GCS: GCS eye subscore is 4. GCS verbal subscore is 5. GCS motor subscore is 6.     Cranial Nerves: No cranial  nerve deficit.     Sensory: No sensory deficit.     Motor: Motor function is intact.  Psychiatric:        Attention and Perception: Attention normal.        Mood and Affect: Mood is anxious.        Speech: Speech normal.        Behavior: Behavior normal.     ED Results / Procedures / Treatments   Labs (all labs ordered are listed, but only abnormal results are displayed) Labs Reviewed  RESP PANEL BY RT-PCR (RSV, FLU A&B, COVID)  RVPGX2  LIPASE, BLOOD  COMPREHENSIVE METABOLIC PANEL  CBC  URINALYSIS, ROUTINE W REFLEX MICROSCOPIC  LACTIC ACID, PLASMA  LACTIC ACID, PLASMA  I-STAT BETA HCG BLOOD, ED ([facility], [facility], [facility] ONLY)    EKG None  Radiology No results found.  Procedures Procedures    Medications Ordered in ED Medications  droperidol (INAPSINE) 2.5 MG/ML injection 1.25 mg (has no administration in time range)  fentaNYL (SUBLIMAZE) injection 50 mcg (has no administration in time range)  lactated ringers bolus 1,000 mL (has no administration in time range)  lactated ringers infusion (has no administration in time range)  oxyCODONE-acetaminophen (PERCOCET/ROXICET) 5-325 MG per tablet 1 tablet (1 tablet Oral Given 09/24/22 0923)  ondansetron (ZOFRAN-ODT) disintegrating tablet 4 mg (4 mg Oral Given 09/24/22 9509)    ED Course/ Medical Decision Making/ A&P                             Medical Decision Making Amount and/or Complexity of Data Reviewed Radiology: ordered.  Risk Prescription drug management.   Patient given IV fluids here for suspected dehydration.  Patient abdominal pain and concern for serious intra-abdominal process and abdominal CT performed.  Abdominal CT per my review and interpretation showed no acute findings.  Patient is status post renal transplant and creatinine is 1.13.  Review of old chart from outside facility shows baseline creatinine around 1.  She was slightly acidotic.  Likely from dehydration.  She is flu positive.  After IV hydration  antiemetics patient feels much better at this time.  Abdominal exam repeated and no evidence of surgical abdomen.  Patient feels well enough to go home and will be discharged        Final Clinical Impression(s) / ED Diagnoses Final diagnoses:  None    Rx / DC Orders ED Discharge Orders     None         Lacretia Leigh, MD 09/24/22 1339

## 2022-09-24 NOTE — ED Triage Notes (Signed)
Pt arrived POV from home c/o N/V that started a couple days ago and then a couple days ago she started having generalized abdominal pain. Pt also endorses hot flashes and sweats.

## 2022-09-24 NOTE — ED Provider Triage Note (Signed)
Emergency Medicine Provider Triage Evaluation Note  Melody [name] , a 47 y.o. female  was evaluated in triage.  Pt complains of nausea, vomiting, abdominal pain for 3 days, history of drug abuse, hypertension, kidney transplant with immunosuppression.  Patient reports that she has not been able to tolerate anything for 3 days including her home immunosuppressive medications.  She reports that she initially was only having nausea, vomiting, with some hot flashes, chills, but around 3 AM this morning she began having severe abdominal pain.  Patient is significantly uncomfortable, and in distress in triage, unable to sit still secondary to pain in the abdomen, and retching into emesis bag.  Review of Systems  Positive: Abdominal pain, nausea, vomiting, diarrhea Negative: fever  Physical Exam  BP (!) 150/115   Pulse (!) 108   Temp 99.1 F (37.3 C) (Oral)   Resp 18   Ht '5\' 7"'$  (1.702 m)   Wt 77.1 kg   SpO2 100%   BMI 26.63 kg/m  Gen:   Awake, appears uncomfortable Resp:  Normal effort  MSK:   Moves extremities without difficulty  Other:  Ttp throughout abdomen, no rigidity, some guarding. Mild tachycardia with normal rhythm noted  Medical Decision Making  Medically screening exam initiated at 9:17 AM.  Appropriate orders placed.  Melody [name] was informed that the remainder of the evaluation will be completed by another provider, this initial triage assessment does not replace that evaluation, and the importance of remaining in the ED until their evaluation is complete.  Workup initiated in triage    Anselmo Pickler, [place] 09/24/22 3532

## 2022-09-24 NOTE — ED Notes (Signed)
Patient Alert and oriented to baseline. Stable and ambulatory to baseline. Patient verbalized understanding of the discharge instructions.  Patient belongings were taken by the patient.   

## 2022-09-29 LAB — CULTURE, BLOOD (ROUTINE X 2)
Culture: NO GROWTH
Special Requests: ADEQUATE

## 2022-12-27 ENCOUNTER — Other Ambulatory Visit: Payer: Self-pay | Admitting: Internal Medicine

## 2022-12-27 DIAGNOSIS — I10 Essential (primary) hypertension: Secondary | ICD-10-CM

## 2023-01-01 LAB — LAB REPORT - SCANNED
Creatinine, POC: 220.5 mg/dL
EGFR: 62

## 2023-06-15 ENCOUNTER — Encounter: Payer: Self-pay | Admitting: Internal Medicine

## 2023-06-15 ENCOUNTER — Ambulatory Visit: Payer: Medicaid Other | Attending: Internal Medicine | Admitting: Internal Medicine

## 2023-06-15 ENCOUNTER — Other Ambulatory Visit (HOSPITAL_COMMUNITY)
Admission: RE | Admit: 2023-06-15 | Discharge: 2023-06-15 | Disposition: A | Discharge status: Home / Self Care | Payer: Medicaid Other | Source: Ambulatory Visit | Attending: Internal Medicine | Admitting: Internal Medicine

## 2023-06-15 VITALS — BP 120/82 | HR 74 | Temp 98.8°F | Ht 67.0 in | Wt 170.1 lb

## 2023-06-15 DIAGNOSIS — Z1231 Encounter for screening mammogram for malignant neoplasm of breast: Secondary | ICD-10-CM

## 2023-06-15 DIAGNOSIS — Z124 Encounter for screening for malignant neoplasm of cervix: Secondary | ICD-10-CM | POA: Diagnosis present

## 2023-06-15 DIAGNOSIS — D849 Immunodeficiency, unspecified: Secondary | ICD-10-CM

## 2023-06-15 DIAGNOSIS — F1290 Cannabis use, unspecified, uncomplicated: Secondary | ICD-10-CM

## 2023-06-15 DIAGNOSIS — I10 Essential (primary) hypertension: Secondary | ICD-10-CM

## 2023-06-15 DIAGNOSIS — E782 Mixed hyperlipidemia: Secondary | ICD-10-CM | POA: Diagnosis not present

## 2023-06-15 DIAGNOSIS — Z23 Encounter for immunization: Secondary | ICD-10-CM | POA: Diagnosis not present

## 2023-06-15 DIAGNOSIS — R7303 Prediabetes: Secondary | ICD-10-CM

## 2023-06-15 DIAGNOSIS — Z1211 Encounter for screening for malignant neoplasm of colon: Secondary | ICD-10-CM

## 2023-06-15 DIAGNOSIS — M79641 Pain in right hand: Secondary | ICD-10-CM

## 2023-06-15 DIAGNOSIS — H547 Unspecified visual loss: Secondary | ICD-10-CM | POA: Diagnosis not present

## 2023-06-15 DIAGNOSIS — Z0000 Encounter for general adult medical examination without abnormal findings: Secondary | ICD-10-CM | POA: Diagnosis not present

## 2023-06-15 NOTE — Progress Notes (Unsigned)
Patient ID: Melody [name], female    DOB: 1976/04/26  MRN: [id number]  CC: Gynecologic Exam (Physical / pap. Med refill - conacalcet, Myfortic/Gout is worsening finger & R foot X2-3 weeks/Yes to flu vax)   Subjective: Melody [name] is a 47 y.o. female who presents for physical. Her concerns today include:  Pt with hx of HTN, former tob dep, ESRD s/p transplant, ACD, chronic diastolic CHF.   HM:  due for MMG, PAP and colon CA screening.  Yes to flu vaccine.  Due for COVID booster.    HTN: Patient on Norvasc 10 mg daily and labetalol 200 mg twice a day.  Taking meds consistently.  Limits salt in the foods. Not checking BP regularly HL:  still on Pravachol.  Denies any muscle aches or muscle cramps on the medicine. S/P kidney transplant: on 5 mg Prednisone, Myfortic and Envarsus.  Followed by transplant clinic at [facility] once a year.  Last seen in January of this year.  Followed locally here in [facility] by Dr. Arrie Aran with [facility].  GYN History:  Pt is G3P1, 2 ectopic Any hx of abn paps?: no Menses regular or irregular?: stopped after dx of renal failure How long does menses last?  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 pylori 03/02/2019   Immunosuppression (HCC) 02/23/2019   Acute bursitis of left shoulder 05/18/2018   Marijuana use, continuous 05/04/2018   Multiple thyroid nodules 05/04/2018   Cervical radiculopathy 05/04/2018   Former tobacco use 09/29/2017   Pre-transplant evaluation for kidney transplant 02/12/2017   Drug abuse (HCC)    Essential hypertension      Current Outpatient Medications on File Prior to Visit  Medication Sig Dispense Refill   acetaminophen (TYLENOL) 500 MG tablet Take 1,000 mg by mouth every 6 (six) hours as needed for mild pain or headache.     amLODipine (NORVASC) 10 MG tablet Take 1 tablet (10 mg total) by mouth daily. 30 tablet 6   ciclopirox (PENLAC) 8 % solution Apply topically at bedtime. Apply over nail and surrounding skin. Apply daily over previous coat. After seven (7) days, may remove with alcohol and continue cycle. 6.6 mL 0   cinacalcet (SENSIPAR) 30 MG tablet Take 30 mg by mouth daily.     ENVARSUS XR 1 MG TB24 Take 6 mg by mouth daily.      labetalol (NORMODYNE) 200 MG tablet Take 1 tablet (200 mg total) by mouth 2 (two) times daily. 60 tablet 6   mycophenolate (MYFORTIC) 180 MG EC tablet Take 540 mg by mouth 2 (two) times daily.     omeprazole (PRILOSEC) 20 MG capsule Take 20 mg by mouth 2 (two) times  daily.     pravastatin (PRAVACHOL) 20 MG tablet Take 1 tablet by mouth daily.     predniSONE (DELTASONE) 20 MG tablet Take 2 tablets (40 mg total) by mouth daily. 10 tablet 0   No current facility-administered medications on file prior to visit.    Allergies  Allergen Reactions   Aluminum Chloride Hives   Codeine Other (See Comments)    Tylenol #3 causes nightmares   Tramadol Hives    Social History   Socioeconomic History   Marital status: Single    Spouse name: Not on file   Number of children: 2    Years of education: some college   Highest education  level: Not on file  Occupational History   Occupation: Owns Education officer, environmental business  Tobacco Use   Smoking status: Former    Types: Software engineer, Cigarettes    Start date: 09/11/2016   Smokeless tobacco: Never   Tobacco comments:    pt stopped smoking a couple months ago  Vaping Use   Vaping status: Never Used  Substance and Sexual Activity   Alcohol use: Yes    Alcohol/week: 0.0 standard drinks of alcohol    Comment: occasionally   Drug use: Yes    Types: Marijuana    Comment: occasionally   Sexual activity: Yes    Birth control/protection: None  Other Topics Concern   Not on file  Social History Narrative   Looking for work    24 and 13 yr son's.    3 grandkids    Social Determinants of Health   Financial Resource Strain: Low Risk  (06/15/2023)   Overall Financial Resource Strain (CARDIA)    Difficulty of Paying Living Expenses: Not hard at all  Food Insecurity: No Food Insecurity (06/15/2023)   Hunger Vital Sign    Worried About Running Out of Food in the Last Year: Never true    Ran Out of Food in the Last Year: Never true  Transportation Needs: No Transportation Needs (06/15/2023)   PRAPARE - Administrator, Civil Service (Medical): No    Lack of Transportation (Non-Medical): No  Physical Activity: Insufficiently Active (06/15/2023)   Exercise Vital Sign    Days of Exercise per Week: 2 days    Minutes of Exercise per Session: 30 min  Stress: No Stress Concern Present (06/15/2023)   [employer] of Occupational Health - Occupational Stress Questionnaire    Feeling of Stress : Not at all  Social Connections: Socially Isolated (06/15/2023)   Social Connection and Isolation Panel [NHANES]    Frequency of Communication with Friends and Family: More than three times a week    Frequency of Social Gatherings with Friends and Family: More than three times a week    Attends Religious Services: Never    Database administrator or Organizations: No    Attends Tax inspector Meetings: Never    Marital Status: Never married  Intimate Partner Violence: Not At Risk (06/15/2023)   Humiliation, Afraid, Rape, and Kick questionnaire    Fear of Current or Ex-Partner: No    Emotionally Abused: No    Physically Abused: No    Sexually Abused: No    Family History  Problem Relation Age of Onset   CAD Mother    Hypertension Mother    Heart disease Mother    Kidney failure Mother    Cirrhosis Father    CAD Brother    Hypertension Other    Kidney failure Other  Hypertension Maternal Grandmother    Kidney failure Maternal Grandmother    Congestive Heart Failure Maternal Grandmother    Breast cancer Sister    Thyroid disease Neg Hx     Past Surgical History:  Procedure Laterality Date   AV FISTULA PLACEMENT Right 09/15/2016   Procedure: ARTERIOVENOUS (AV) FISTULA CREATION UPPER RIGHT ARM;  Surgeon: Larina Earthly, MD;  Location: [facility] OR;  Service: Vascular;  Laterality: Right;   AV FISTULA PLACEMENT Left 12/30/2016   Procedure: ARTERIOVENOUS (AV) FISTULA CREATION USING GORETEX STRETCH GRAFT;  Surgeon: Sherren Kerns, MD;  Location: [facility] OR;  Service: Vascular;  Laterality: Left;   AV FISTULA PLACEMENT Left 06/15/2017   Procedure: INSERTION OF ARTERIOVENOUS (AV) GORE-TEX GRAFT X 40CM LEFT ARM;  Surgeon: Larina Earthly, MD;  Location: [facility] OR;  Service: Vascular;  Laterality: Left;   AV FISTULA PLACEMENT Right 08/05/2018   Procedure: Insertion of Right arm gortex graft;  Surgeon: Larina Earthly, MD;  Location: [facility] OR;  Service: Vascular;  Laterality: Right;   DILATION AND CURETTAGE OF UTERUS     INSERTION OF DIALYSIS CATHETER Right 09/15/2016   Procedure: INSERTION OF DIALYSIS CATHETER ON RIGHT SIDE;  Surgeon: Larina Earthly, MD;  Location: [facility] OR;  Service: Vascular;  Laterality: Right;   KIDNEY TRANSPLANT Right 02/18/2019    ROS: Review of Systems  Constitutional:        Not getting in much exercise.  She feels she does okay with her eating habits.  She  loves drinking ginger ale.  History of prediabetes with A1c of that sometime last year through [facility].  HENT:  Negative for congestion, dental problem, hearing loss, sore throat and trouble swallowing.   Eyes:        Wears reading glasses.  Reports poor nighttime vision when driving.  Respiratory:  Negative for cough and shortness of breath.   Cardiovascular:  Negative for chest pain.  Gastrointestinal:  Negative for abdominal pain.       Moving her bowels okay.  No blood in the stools.  Musculoskeletal:        Reports being diagnosed with gout about 15 years ago through the emergency room when she presented with pain in the bunion on her right foot.  Lately she has been noticing some soreness in the right hand over the knuckles that comes and goes.  Soreness occasionally in the bunion of the right big toe.  Neurological:  Negative for dizziness.  Psychiatric/Behavioral:  Negative for dysphoric mood.        Smokes marijuana daily.  Reports that it keeps her appetite up.   Negative except as stated above  PHYSICAL EXAM: BP 120/82 (BP Location: Left Arm, Patient Position: Sitting, Cuff Size: Normal)   Pulse 74   Temp 98.8 F (37.1 C) (Oral)   Ht 5\' 7"  (1.702 m)   Wt 170 lb 1.6 oz (77.2 kg)   SpO2 100%   BMI 26.64 kg/m   Physical Exam  General appearance - alert, well appearing, middle-age African-American female and in no distress Mental status - normal mood, behavior, speech, dress, motor activity, and thought processes Eyes - pupils equal and reactive, extraocular eye movements intact Ears - bilateral TM's and external ear canals normal Nose - normal and patent, no erythema, discharge or polyps Mouth - mucous membranes moist, pharynx normal without lesions Neck - supple, no significant adenopathy Lymphatics - no palpable lymphadenopathy, no hepatosplenomegaly Chest - clear to auscultation, no wheezes, rales or  rhonchi, symmetric air entry Heart - normal rate, regular  rhythm, normal S1, S2, no murmurs, rubs, clicks or gallops Abdomen - soft, nontender, nondistended, no masses or organomegaly Breasts -CMA Clarissa present as chaperone for breast and pelvic exam: Breasts appear normal, no suspicious masses, no skin or nipple changes or axillary nodes Pelvic - normal external genitalia, vulva, vagina, cervix, uterus and adnexa Neurological - cranial nerves II through XII intact, normal muscle tone, no tremors, strength 5/5 Musculoskeletal -slight enlargement of the MCP joint of the index finger on the right hand.  She has good range of motion of the fingers in both hands.  She has a noninflamed bunion of the right big toe. Extremities - peripheral pulses normal, no pedal edema, no clubbing or cyanosis Skin - normal coloration and turgor, no rashes, no suspicious skin lesions noted      Latest Ref Rng & Units 09/24/2022    9:34 AM 08/08/2020    8:57 PM 04/06/2020    4:32 AM  CMP  Glucose 70 - 99 mg/dL 811  87  914   BUN 6 - 20 mg/dL 13  15  24    Creatinine 0.44 - 1.00 mg/dL 7.82  9.56  2.13   Sodium 135 - 145 mmol/L 139  139  133   Potassium 3.5 - 5.1 mmol/L 3.9  3.8  3.6   Chloride 98 - 111 mmol/L 109  106  100   CO2 22 - 32 mmol/L 15  22  23    Calcium 8.9 - 10.3 mg/dL 08.6  9.8  9.9   Total Protein 6.5 - 8.1 g/dL 8.0  7.9  6.0   Total Bilirubin 0.3 - 1.2 mg/dL 0.8  0.4  0.8   Alkaline Phos 38 - 126 U/L 70  74  43   AST 15 - 41 U/L 21  21  16    ALT 0 - 44 U/L 24  18  19     Lipid Panel     Component Value Date/Time   CHOL 285 (H) 09/29/2017 1024   TRIG 89 09/29/2017 1024   HDL 91 09/29/2017 1024   CHOLHDL 3.1 09/29/2017 1024   CHOLHDL 2.5 01/15/2015 0245   VLDL 15 01/15/2015 0245   LDLCALC 176 (H) 09/29/2017 1024    CBC    Component Value Date/Time   WBC 12.4 (H) 09/24/2022 0934   RBC 5.35 (H) 09/24/2022 0934   HGB 16.8 (H) 09/24/2022 0934   HCT 48.6 (H) 09/24/2022 0934   PLT 210 09/24/2022 0934   MCV 90.8 09/24/2022 0934   MCH 31.4  09/24/2022 0934   MCHC 34.6 09/24/2022 0934   RDW 12.8 09/24/2022 0934   LYMPHSABS 1.2 08/08/2020 2057   MONOABS 0.6 08/08/2020 2057   EOSABS 0.1 08/08/2020 2057   BASOSABS 0.1 08/08/2020 2057   A1C 6.1/BS 136  ASSESSMENT AND PLAN: 1. Annual physical exam   2. Pap smear for cervical cancer screening - Cytology - PAP - Cervicovaginal ancillary only  3. Essential hypertension Close to goal.  Continue Norvasc 10 mg daily and labetalol 200 mg twice a day.  4. Mixed hyperlipidemia Continue pravastatin.  5. Prediabetes Patient advised to eliminate sugary drinks from the diet, cut back on portion sizes especially of white carbohydrates, eat more white lean meat like chicken [place] and seafood instead of beef or pork and incorporate fresh fruits and vegetables into the diet daily. Encouraged her to get in some form of moderate intensity exercise 3 to 5 days a week  for 30 minutes.  Advised to start low and go slow.  6. Poor vision - Ambulatory referral to Ophthalmology  7. Immunosuppression [facility]) Patient currently on immunosuppressant medications due to kidney transplant.  Being followed by nephrology  8. Pain of right hand Could be mild arthritis developing.  Use Tylenol as needed.  9. Marijuana use, continuous Discourage continuous use.  10. Screening for colon cancer - Ambulatory referral to Gastroenterology  11. Encounter for screening mammogram for malignant neoplasm of breast - MM Digital Screening; Future  12. Encounter for immunization - Flu vaccine trivalent PF, 6mos and older(Flulaval,Afluria,Fluarix,Fluzone)    Patient was given the opportunity to ask questions.  Patient verbalized understanding of the plan and was able to repeat key elements of the plan.   This documentation was completed using Paediatric nurse.  Any transcriptional errors are unintentional.  Orders Placed This Encounter  Procedures   MM Digital Screening   Flu vaccine  trivalent PF, 6mos and older(Flulaval,Afluria,Fluarix,Fluzone)   Ambulatory referral to Gastroenterology   Ambulatory referral to Ophthalmology     Requested Prescriptions    No prescriptions requested or ordered in this encounter    Return in about 4 months (around 10/16/2023).  Jonah Blue, MD, FACP

## 2023-06-15 NOTE — Patient Instructions (Addendum)
You  have been referred to [facility] for your colonoscopy [address] [place], [place] [address] PH# [phone]  Preventive Care 52-49 Years Old, Female Preventive care refers to lifestyle choices and visits with your health care provider that can promote health and wellness. Preventive care visits are also called wellness exams. What can I expect for my preventive care visit? Counseling Your health care provider may ask you questions about your: Medical history, including: Past medical problems. Family medical history. Pregnancy history. Current health, including: Menstrual cycle. Method of birth control. Emotional well-being. Home life and relationship well-being. Sexual activity and sexual health. Lifestyle, including: Alcohol, nicotine or tobacco, and drug use. Access to firearms. Diet, exercise, and sleep habits. Work and work Astronomer. Sunscreen use. Safety issues such as seatbelt and bike helmet use. Physical exam Your health care provider will check your: Height and weight. These may be used to calculate your BMI (body mass index). BMI is a measurement that tells if you are at a healthy weight. Waist circumference. This measures the distance around your waistline. This measurement also tells if you are at a healthy weight and may help predict your risk of certain diseases, such as type 2 diabetes and high blood pressure. Heart rate and blood pressure. Body temperature. Skin for abnormal spots. What immunizations do I need?  Vaccines are usually given at various ages, according to a schedule. Your health care provider will recommend vaccines for you based on your age, medical history, and lifestyle or other factors, such as travel or where you work. What tests do I need? Screening Your health care provider may recommend screening tests for certain conditions. This may include: Lipid and cholesterol levels. Diabetes screening. This is done by checking your  blood sugar (glucose) after you have not eaten for a while (fasting). Pelvic exam and Pap test. Hepatitis B test. Hepatitis C test. HIV (human immunodeficiency virus) test. STI (sexually transmitted infection) testing, if you are at risk. Lung cancer screening. Colorectal cancer screening. Mammogram. Talk with your health care provider about when you should start having regular mammograms. This may depend on whether you have a family history of breast cancer. BRCA-related cancer screening. This may be done if you have a family history of breast, ovarian, tubal, or peritoneal cancers. Bone density scan. This is done to screen for osteoporosis. Talk with your health care provider about your test results, treatment options, and if necessary, the need for more tests. Follow these instructions at home: Eating and drinking  Eat a diet that includes fresh fruits and vegetables, whole grains, lean protein, and low-fat dairy products. Take vitamin and mineral supplements as recommended by your health care provider. Do not drink alcohol if: Your health care provider tells you not to drink. You are pregnant, may be pregnant, or are planning to become pregnant. If you drink alcohol: Limit how much you have to 0-1 drink a day. Know how much alcohol is in your drink. In the [place] one drink equals one 12 oz bottle of beer (355 mL), one 5 oz glass of wine (148 mL), or one 1 oz glass of hard liquor (44 mL). Lifestyle Brush your teeth every morning and night with fluoride toothpaste. Floss one time each day. Exercise for at least 30 minutes 5 or more days each week. Do not use any products that contain nicotine or tobacco. These products include cigarettes, chewing tobacco, and vaping devices, such as e-cigarettes. If you need help quitting, ask your health care provider.  Do not use drugs. If you are sexually active, practice safe sex. Use a condom or other form of protection to prevent STIs. If you do  not wish to become pregnant, use a form of birth control. If you plan to become pregnant, see your health care provider for a prepregnancy visit. Take aspirin only as told by your health care provider. Make sure that you understand how much to take and what form to take. Work with your health care provider to find out whether it is safe and beneficial for you to take aspirin daily. Find healthy ways to manage stress, such as: Meditation, yoga, or listening to music. Journaling. Talking to a trusted person. Spending time with friends and family. Minimize exposure to UV radiation to reduce your risk of skin cancer. Safety Always wear your seat belt while driving or riding in a vehicle. Do not drive: If you have been drinking alcohol. Do not ride with someone who has been drinking. When you are tired or distracted. While texting. If you have been using any mind-altering substances or drugs. Wear a helmet and other protective equipment during sports activities. If you have firearms in your house, make sure you follow all gun safety procedures. Seek help if you have been physically or sexually abused. What's next? Visit your health care provider once a year for an annual wellness visit. Ask your health care provider how often you should have your eyes and teeth checked. Stay up to date on all vaccines. This information is not intended to replace advice given to you by your health care provider. Make sure you discuss any questions you have with your health care provider. Document Revised: 02/06/2021 Document Reviewed: 02/06/2021 Elsevier Patient Education  2024 [employer].

## 2023-06-16 ENCOUNTER — Other Ambulatory Visit: Payer: Self-pay | Admitting: Internal Medicine

## 2023-06-16 LAB — CERVICOVAGINAL ANCILLARY ONLY
Bacterial Vaginitis (gardnerella): POSITIVE — AB
Candida Glabrata: NEGATIVE
Candida Vaginitis: NEGATIVE
Chlamydia: NEGATIVE
Comment: NEGATIVE
Comment: NEGATIVE
Comment: NEGATIVE
Comment: NEGATIVE
Comment: NEGATIVE
Comment: NORMAL
Neisseria Gonorrhea: NEGATIVE
Trichomonas: NEGATIVE

## 2023-06-16 LAB — POCT GLYCOSYLATED HEMOGLOBIN (HGB A1C): HbA1c, POC (controlled diabetic range): 6.1 % (ref 0.0–7.0)

## 2023-06-16 MED ORDER — CLINDAMYCIN HCL 300 MG PO CAPS: 300.0000 mg | ORAL_CAPSULE | Freq: Two times a day (BID) | ORAL | 0 refills | Status: DC

## 2023-06-18 LAB — CYTOLOGY - PAP
Comment: NEGATIVE
Diagnosis: NEGATIVE
High risk HPV: NEGATIVE

## 2023-09-03 LAB — LAB REPORT - SCANNED: EGFR: 75

## 2023-09-21 ENCOUNTER — Ambulatory Visit (INDEPENDENT_AMBULATORY_CARE_PROVIDER_SITE_OTHER): Payer: Medicaid Other

## 2023-09-21 ENCOUNTER — Ambulatory Visit
Admission: EM | Admit: 2023-09-21 | Discharge: 2023-09-21 | Disposition: A | Discharge status: Home / Self Care | Payer: Medicaid Other | Attending: Physician Assistant | Admitting: Physician Assistant

## 2023-09-21 DIAGNOSIS — R051 Acute cough: Secondary | ICD-10-CM | POA: Diagnosis not present

## 2023-09-21 DIAGNOSIS — I10 Essential (primary) hypertension: Secondary | ICD-10-CM

## 2023-09-21 DIAGNOSIS — J069 Acute upper respiratory infection, unspecified: Secondary | ICD-10-CM | POA: Diagnosis not present

## 2023-09-21 DIAGNOSIS — Z940 Kidney transplant status: Secondary | ICD-10-CM

## 2023-09-21 DIAGNOSIS — R0981 Nasal congestion: Secondary | ICD-10-CM | POA: Diagnosis not present

## 2023-09-21 LAB — POC COVID19/FLU A&B COMBO
Covid Antigen, POC: NEGATIVE
Influenza A Antigen, POC: NEGATIVE
Influenza B Antigen, POC: NEGATIVE

## 2023-09-21 MED ORDER — FLUTICASONE PROPIONATE 50 MCG/ACT NA SUSP: 1.0000 | Freq: Every day | NASAL | 0 refills | Status: DC

## 2023-09-21 MED ORDER — BENZONATATE 100 MG PO CAPS: 100.0000 mg | ORAL_CAPSULE | Freq: Three times a day (TID) | ORAL | 0 refills | Status: DC

## 2023-09-21 MED ORDER — AEROCHAMBER PLUS FLO-VU LARGE MISC
1.0000 | Freq: Once | Status: AC
Start: 2023-09-21 — End: 2023-09-21
  Administered 2023-09-21: 1

## 2023-09-21 MED ORDER — ALBUTEROL SULFATE HFA 108 (90 BASE) MCG/ACT IN AERS
2.0000 | INHALATION_SPRAY | Freq: Once | RESPIRATORY_TRACT | Status: AC
  Administered 2023-09-21: 2 via RESPIRATORY_TRACT

## 2023-09-21 NOTE — Discharge Instructions (Signed)
You were negative for flu and COVID.  Your chest x-ray was negative.  I do not see any evidence of an infection that we should start antibiotics.  Use Tessalon for cough and Flonase for congestion.  Make sure you rest and drink plenty of fluids.  Use the albuterol every 4-6 hours as needed for shortness of breath and coughing fits.  Follow-up with your primary care later this week for reevaluation.  If anything worsens and you have high fever, worsening cough, chest pain, shortness of breath, nausea, vomiting you need to go to the emergency room immediately as we discussed.

## 2023-09-21 NOTE — ED Triage Notes (Signed)
Cough and nasal congestion x 1 day. Denies any recent fever.

## 2023-09-21 NOTE — ED Provider Notes (Signed)
[facility]    CSN: [id number] Arrival date & time: 09/21/23  1245      History   Chief Complaint No chief complaint on file.   HPI Melody [name] is a 48 y.o. female.   Patient presents today with 2-day history of URI symptoms including cough and congestion.  She denies any shortness of breath, chest pain, vomiting, diarrhea.  She initially had some nausea but this has improved.  She has taken cough drops but no other over-the-counter medications.  Denies any known sick contacts.  She has had COVID in 2020 but not more recently.  She is up-to-date on COVID-19 vaccinations and has had her influenza vaccine.  She does not smoke cigarettes.  She is on chronic prednisone as part of antirejection medication regimen for kidney transplant.  She denies history of asthma or COPD.  She denies any recent antibiotics or additional steroid use in the past 90 days.    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 pylori 03/02/2019   Immunosuppression (HCC) 02/23/2019   Acute bursitis of left shoulder 05/18/2018   Marijuana use, continuous 05/04/2018   Multiple thyroid nodules 05/04/2018   Cervical radiculopathy 05/04/2018   Former tobacco use 09/29/2017   Pre-transplant evaluation for kidney transplant 02/12/2017   Drug  abuse [facility])    Essential hypertension     Past Surgical History:  Procedure Laterality Date   AV FISTULA PLACEMENT Right 09/15/2016   Procedure: ARTERIOVENOUS (AV) FISTULA CREATION UPPER RIGHT ARM;  Surgeon: Larina Earthly, MD;  Location: [facility] OR;  Service: Vascular;  Laterality: Right;   AV FISTULA PLACEMENT Left 12/30/2016   Procedure: ARTERIOVENOUS (AV) FISTULA CREATION USING GORETEX STRETCH GRAFT;  Surgeon: Sherren Kerns, MD;  Location: [facility] OR;  Service: Vascular;  Laterality: Left;   AV FISTULA PLACEMENT Left 06/15/2017   Procedure: INSERTION OF ARTERIOVENOUS (AV) GORE-TEX GRAFT X 40CM LEFT ARM;  Surgeon: Larina Earthly, MD;  Location: [facility] OR;  Service: Vascular;  Laterality: Left;   AV FISTULA PLACEMENT Right 08/05/2018   Procedure: Insertion of Right arm gortex graft;  Surgeon: Larina Earthly, MD;  Location[facility] OR;  Service: Vascular;  Laterality: Right;   DILATION AND CURETTAGE OF UTERUS     INSERTION OF DIALYSIS CATHETER Right 09/15/2016   Procedure: INSERTION OF DIALYSIS CATHETER ON RIGHT SIDE;  Surgeon: Larina Earthly, MD;  Location: [facility] OR;  Service: Vascular;  Laterality: Right;   KIDNEY TRANSPLANT Right 02/18/2019    OB History   No obstetric history on file.      Home Medications    Prior to Admission medications   Medication Sig Start Date End Date Taking? Authorizing Provider  amLODipine (NORVASC) 10 MG tablet Take 1 tablet (10 mg total) by mouth daily. 12/26/21  Yes Marcine Matar, MD  benzonatate (TESSALON) 100 MG capsule Take 1 capsule (100 mg total) by mouth every 8 (eight) hours. 09/21/23  Yes Beyza Bellino K, PA-C  ENVARSUS XR 1 MG TB24 Take 6 mg by mouth daily.  02/08/20  Yes [provider]  fluticasone (FLONASE) 50 MCG/ACT nasal spray Place 1 spray into both nostrils daily. 09/21/23  Yes Dak Szumski K, PA-C  labetalol (NORMODYNE) 200 MG tablet Take 1 tablet (200 mg total) by mouth 2 (two) times daily. 12/26/21  Yes Marcine Matar, MD  mycophenolate  (MYFORTIC) 180 MG EC tablet Take 540 mg by mouth 2 (two) times daily. 02/20/20  Yes [provider]  pravastatin (PRAVACHOL) 20 MG tablet Take 1 tablet by mouth daily. 09/30/21  Yes [provider]  predniSONE (DELTASONE) 20 MG tablet Take 2 tablets (40 mg total) by mouth daily. 06/12/20  Yes Mardella Layman, MD    Family History Family History  Problem Relation Age of Onset   CAD Mother    Hypertension Mother    Heart disease Mother    Kidney failure Mother    Cirrhosis Father    CAD Brother    Hypertension Other    Kidney failure Other    Hypertension Maternal Grandmother    Kidney failure Maternal Grandmother    Congestive Heart Failure Maternal Grandmother    Breast cancer Sister    Thyroid disease Neg Hx     Social History Social History   Tobacco Use   Smoking status: Former    Types: Cigars, Cigarettes    Start date: 09/11/2016   Smokeless tobacco: Never   Tobacco comments:    pt stopped smoking a couple months ago  Vaping Use   Vaping status: Never Used  Substance Use Topics   Alcohol use: Yes    Alcohol/week: 0.0 standard drinks of alcohol    Comment: occasionally   Drug use: Yes    Types: Marijuana    Comment: occasionally     Allergies   Aluminum chloride, Codeine, and Tramadol   Review of Systems Review of Systems  Constitutional:  Positive for activity change and appetite change. Negative for fatigue and fever.  HENT:  Positive for congestion. Negative for sinus pressure, sneezing and sore throat.   Respiratory:  Positive for cough. Negative for shortness of breath.   Cardiovascular:  Negative for chest pain.  Gastrointestinal:  Positive for nausea (improved). Negative for abdominal pain, diarrhea and vomiting.  Musculoskeletal:  Negative for arthralgias and myalgias.  Neurological:  Negative for dizziness, light-headedness and headaches.     Physical Exam Triage Vital Signs ED Triage Vitals  Encounter Vitals Group     BP  09/21/23 1609 (!) 178/141     Systolic BP Percentile --      Diastolic BP Percentile --      Pulse Rate 09/21/23 1609 86     Resp 09/21/23 1609 18     Temp 09/21/23 1609 98.5 F (36.9 C)     Temp Source 09/21/23 1609 Oral     SpO2 09/21/23 1609 95 %     Weight --      Height --      Head Circumference --      Peak Flow --      Pain Score 09/21/23 1610 0     Pain Loc --      Pain Education --      Exclude from Growth Chart --    No data  found.  Updated Vital Signs BP (!) 167/119 (BP Location: Left Arm)   Pulse 86   Temp 98.5 F (36.9 C) (Oral)   Resp 18   SpO2 95%   Visual Acuity Right Eye Distance:   Left Eye Distance:   Bilateral Distance:    Right Eye Near:   Left Eye Near:    Bilateral Near:     Physical Exam Vitals reviewed.  Constitutional:      General: She is awake. She is not in acute distress.    Appearance: Normal appearance. She is well-developed. She is not ill-appearing.     Comments: Very pleasant female appears stated age in no acute distress sitting comfortably in exam room  HENT:     Head: Normocephalic and atraumatic.     Right Ear: Tympanic membrane, ear canal and external ear normal. Tympanic membrane is not erythematous or bulging.     Left Ear: Tympanic membrane, ear canal and external ear normal. Tympanic membrane is not erythematous or bulging.     Nose:     Right Sinus: No maxillary sinus tenderness or frontal sinus tenderness.     Left Sinus: No maxillary sinus tenderness or frontal sinus tenderness.     Mouth/Throat:     Pharynx: Uvula midline. No oropharyngeal exudate or posterior oropharyngeal erythema.  Cardiovascular:     Rate and Rhythm: Normal rate and regular rhythm.     Heart sounds: Normal heart sounds, S1 normal and S2 normal. No murmur heard. Pulmonary:     Effort: Pulmonary effort is normal.     Breath sounds: Wheezing present. No rhonchi or rales.     Comments: Scattered wheezing Psychiatric:        Behavior: Behavior  is cooperative.      UC Treatments / Results  Labs (all labs ordered are listed, but only abnormal results are displayed) Labs Reviewed  POC COVID19/FLU A&B COMBO - Normal    EKG   Radiology DG Chest 2 View Result Date: 09/21/2023 CLINICAL DATA:  Acute cough.  Nasal congestion. EXAM: CHEST - 2 VIEW COMPARISON:  Chest radiograph dated 09/24/2022. FINDINGS: No focal consolidation, pleural effusion, pneumothorax. The cardiac silhouette is within limits. No acute osseous pathology. Left axillary vascular stent. IMPRESSION: No active cardiopulmonary disease. Electronically Signed   By: Elgie Collard M.D.   On: 09/21/2023 17:24    Procedures Procedures (including critical care time)  Medications Ordered in UC Medications  albuterol (VENTOLIN HFA) 108 (90 Base) MCG/ACT inhaler 2 puff (2 puffs Inhalation Given 09/21/23 1651)  AeroChamber Plus Flo-Vu Large MISC 1 each (1 each Other Given 09/21/23 1651)    Initial Impression / Assessment and Plan / UC Course  I have reviewed the triage vital signs and the nursing notes.  Pertinent labs & imaging results that were available during my care of the patient were reviewed by me and considered in my medical decision making (see chart for details).     Patient is well-appearing, afebrile, nontoxic, nontachycardic.  She was mildly hypertensive but had not taken her medication today and was encouraged to go home and take it and then monitor her blood pressure at home.  She is to follow closely with her PCP/nephrologist to manage her blood pressure.  She denies any signs/symptoms of endorgan damage and was encouraged to avoid decongestants, caffeine, sodium, NSAIDs.  Discussed likely viral etiology.  She was negative for COVID and flu.  Chest x-ray was obtained given her immunosuppression that showed no acute cardiopulmonary disease.  She was given albuterol in clinic that provided relief of shortness of breath and wheezing symptoms.  She was sent  home with this medication to be used every 4-6 hours as needed.  I offered to UA but she denied any symptoms of UTI so declined this today.  We discussed that there is no evidence of an acute infection that would warrant initiation of antibiotics.  She was encouraged to treat her symptoms and was given Flonase for nasal congestion and Tessalon for cough.  She is to rest and drink plenty of fluid.  We discussed that if her symptoms are not improving within a few days she should follow-up with her primary care.  She should have a low threshold for going to the ER with any worsening symptoms.  Strict return precautions given.  Excuse note provided.  Final Clinical Impressions(s) / UC Diagnoses   Final diagnoses:  Acute cough  Upper respiratory tract infection, unspecified type  Nasal congestion  Elevated blood pressure reading with diagnosis of hypertension  History of kidney transplant     Discharge Instructions      You were negative for flu and COVID.  Your chest x-ray was negative.  I do not see any evidence of an infection that we should start antibiotics.  Use Tessalon for cough and Flonase for congestion.  Make sure you rest and drink plenty of fluids.  Use the albuterol every 4-6 hours as needed for shortness of breath and coughing fits.  Follow-up with your primary care later this week for reevaluation.  If anything worsens and you have high fever, worsening cough, chest pain, shortness of breath, nausea, vomiting you need to go to the emergency room immediately as we discussed.     ED Prescriptions     Medication Sig Dispense Auth. Provider   fluticasone (FLONASE) 50 MCG/ACT nasal spray Place 1 spray into both nostrils daily. 16 g Holley Wirt K, PA-C   benzonatate (TESSALON) 100 MG capsule Take 1 capsule (100 mg total) by mouth every 8 (eight) hours. 21 capsule Breahna Boylen K, PA-C      PDMP not reviewed this encounter.   Jeani Hawking, PA-C 09/21/23 1740

## 2023-10-16 ENCOUNTER — Ambulatory Visit: Payer: Medicaid Other | Attending: Internal Medicine | Admitting: Internal Medicine

## 2023-10-16 ENCOUNTER — Encounter: Payer: Self-pay | Admitting: Internal Medicine

## 2023-10-16 VITALS — BP 156/109 | HR 74 | Temp 98.1°F | Ht 67.0 in | Wt 168.0 lb

## 2023-10-16 DIAGNOSIS — I10 Essential (primary) hypertension: Secondary | ICD-10-CM | POA: Diagnosis not present

## 2023-10-16 DIAGNOSIS — E782 Mixed hyperlipidemia: Secondary | ICD-10-CM

## 2023-10-16 DIAGNOSIS — R7303 Prediabetes: Secondary | ICD-10-CM | POA: Diagnosis not present

## 2023-10-16 DIAGNOSIS — Z940 Kidney transplant status: Secondary | ICD-10-CM

## 2023-10-16 DIAGNOSIS — Z1211 Encounter for screening for malignant neoplasm of colon: Secondary | ICD-10-CM

## 2023-10-16 MED ORDER — CINACALCET HCL 30 MG PO TABS: ORAL_TABLET | ORAL | 1 refills | Status: AC

## 2023-10-16 NOTE — Patient Instructions (Signed)
Placed in [facility] [address] [place], [place] [address] PH# [phone]

## 2023-10-16 NOTE — Progress Notes (Signed)
Patient ID: Melody [name], female    DOB: 07-17-1976  MRN: [id number]  CC: Hypertension (HTN f/u./No questions / concerns/No to shingles vax)   Subjective: Melody [name] is a 48 y.o. female who presents for chronic ds management. Her concerns today include:  Pt with hx of HTN, former tob dep, ESRD s/p transplant, ACD, chronic diastolic CHF, marijuana use, PreDM.   Discussed the use of AI scribe software for clinical note transcription with the patient, who gave verbal consent to proceed.  History of Present Illness   The patient, with a history of hypertension, kidney transplant, and prediabetes, presents for a routine follow-up. She reports no major health issues since the last visit in October of the previous year.   HM: She is due for colon cancer screening and has been referred to a gastroenterologist, but they have not been able to reach her despite leaving messages on her phone, message sent to MyChart and was mailed a letter.  She is agreeable for me to put their information on her discharge summary so she can call them to schedule appointment for colonoscopy.   HTN: on labetalol 200 mg twice daily and amlodipine 10mg  daily. She admits to forgetting to take her medication today due to starting a new job and dealing with family news. She does not regularly check her blood pressure at home but has agreed to get a blood pressure device.  She continues to take the pravastatin for cholesterol.  Tolerating the medicine well.  Hx of Kidney Transplant:  In terms of her kidney transplant, she is on prednisone 5mg  daily, Myfortic, Sensipar and Envarsus.  Followed by Dr. Arrie Aran with [facility] whom she sees every 3 to 4 months and the transplant clinic at [facility] annually. Her last visit to the transplant clinic on 09/07/23 showed BP 122/87. Her kidney function was good with GFR 75 and her A1C was 6.2. prediabetes, likely influenced by the prednisone. She has been  trying to improve her diet by reducing chocolate and soda intake and increasing water consumption. She also gets some exercise through walking and her new job, which involves a lot of walking.        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 30 tablet 6   labetalol (NORMODYNE) 200 MG tablet Take 1 tablet (200 mg total) by mouth 2 (two) times daily. 60 tablet 6   mycophenolate (MYFORTIC) 180 MG EC tablet Take 540 mg by mouth 2 (two) times daily.     pravastatin (PRAVACHOL) 20 MG tablet Take 1 tablet by mouth daily.     predniSONE (  DELTASONE) 20 MG tablet Take 2 tablets (40 mg total) by mouth daily. 10 tablet 0   ENVARSUS XR 1 MG TB24 Take 6 mg by mouth daily.  (Patient not taking: Reported on 10/16/2023)     fluticasone (FLONASE) 50 MCG/ACT nasal spray Place 1 spray into both nostrils daily. (Patient not taking: Reported on 10/16/2023) 16 g 0   No current facility-administered medications on file prior to visit.    Allergies  Allergen Reactions   Aluminum  Chloride Hives   Codeine Other (See Comments)    Tylenol #3 causes nightmares   Tramadol Hives    Social History   Socioeconomic History   Marital status: Single    Spouse name: Not on file   Number of children: 2    Years of education: some college   Highest education level: Not on file  Occupational History   Occupation: Owns Education officer, environmental business  Tobacco Use   Smoking status: Former    Types: Software engineer, Cigarettes    Start date: 09/11/2016   Smokeless tobacco: Never   Tobacco comments:    pt stopped smoking a couple months ago  Vaping Use   Vaping status: Never Used  Substance and Sexual Activity   Alcohol use: Yes    Alcohol/week: 0.0 standard drinks of alcohol    Comment: occasionally   Drug use: Yes    Types: Marijuana    Comment: occasionally   Sexual activity: Yes    Birth control/protection: None  Other Topics Concern   Not on file  Social History Narrative   Looking for work    24 and 13 yr son's.    3 grandkids    Social Drivers of Corporate investment banker Strain: Low Risk  (06/15/2023)   Overall Financial Resource Strain (CARDIA)    Difficulty of Paying Living Expenses: Not hard at all  Food Insecurity: No Food Insecurity (06/15/2023)   Hunger Vital Sign    Worried About Running Out of Food in the Last Year: Never true    Ran Out of Food in the Last Year: Never true  Transportation Needs: No Transportation Needs (06/15/2023)   PRAPARE - Administrator, Civil Service (Medical): No    Lack of Transportation (Non-Medical): No  Physical Activity: Insufficiently Active (06/15/2023)   Exercise Vital Sign    Days of Exercise per Week: 2 days    Minutes of Exercise per Session: 30 min  Stress: No Stress Concern Present (06/15/2023)   [employer] of Occupational Health - Occupational Stress Questionnaire    Feeling of Stress : Not at all  Social Connections: Socially Isolated (06/15/2023)   Social Connection and Isolation Panel [NHANES]     Frequency of Communication with Friends and Family: More than three times a week    Frequency of Social Gatherings with Friends and Family: More than three times a week    Attends Religious Services: Never    Database administrator or Organizations: No    Attends Banker Meetings: Never    Marital Status: Never married  Intimate Partner Violence: Not At Risk (06/15/2023)   Humiliation, Afraid, Rape, and Kick questionnaire    Fear of Current or Ex-Partner: No    Emotionally Abused: No    Physically Abused: No    Sexually Abused: No    Family History  Problem Relation Age of Onset   CAD Mother    Hypertension Mother    Heart disease Mother  Kidney failure Mother    Cirrhosis Father    CAD Brother    Hypertension Other    Kidney failure Other    Hypertension Maternal Grandmother    Kidney failure Maternal Grandmother    Congestive Heart Failure Maternal Grandmother    Breast cancer Sister    Thyroid disease Neg Hx     Past Surgical History:  Procedure Laterality Date   AV FISTULA PLACEMENT Right 09/15/2016   Procedure: ARTERIOVENOUS (AV) FISTULA CREATION UPPER RIGHT ARM;  Surgeon: Larina Earthly, MD;  Location: [facility] OR;  Service: Vascular;  Laterality: Right;   AV FISTULA PLACEMENT Left 12/30/2016   Procedure: ARTERIOVENOUS (AV) FISTULA CREATION USING GORETEX STRETCH GRAFT;  Surgeon: Sherren Kerns, MD;  Location: [facility] OR;  Service: Vascular;  Laterality: Left;   AV FISTULA PLACEMENT Left 06/15/2017   Procedure: INSERTION OF ARTERIOVENOUS (AV) GORE-TEX GRAFT X 40CM LEFT ARM;  Surgeon: Larina Earthly, MD;  Location: [facility] OR;  Service: Vascular;  Laterality: Left;   AV FISTULA PLACEMENT Right 08/05/2018   Procedure: Insertion of Right arm gortex graft;  Surgeon: Larina Earthly, MD;  Location: [facility] OR;  Service: Vascular;  Laterality: Right;   DILATION AND CURETTAGE OF UTERUS     INSERTION OF DIALYSIS CATHETER Right 09/15/2016   Procedure: INSERTION OF DIALYSIS CATHETER ON  RIGHT SIDE;  Surgeon: Larina Earthly, MD;  Location: [facility] OR;  Service: Vascular;  Laterality: Right;   KIDNEY TRANSPLANT Right 02/18/2019    ROS: Review of Systems Negative except as stated above  PHYSICAL EXAM: BP (!) 156/109 (BP Location: Left Arm, Patient Position: Sitting, Cuff Size: Normal)   Pulse 74   Temp 98.1 F (36.7 C) (Oral)   Ht 5\' 7"  (1.702 m)   Wt 168 lb (76.2 kg)   SpO2 100%   BMI 26.31 kg/m   Physical Exam  General appearance - alert, well appearing, and in no distress Mental status - normal mood, behavior, speech, dress, motor activity, and thought processes Neck - supple, no significant adenopathy Chest - clear to auscultation, no wheezes, rales or rhonchi, symmetric air entry Heart - normal rate, regular rhythm, normal S1, S2, no murmurs, rubs, clicks or gallops Extremities - peripheral pulses normal, no pedal edema, no clubbing or cyanosis  Recent labs done through [facility] were reviewed today.     Latest Ref Rng & Units 09/24/2022    9:34 AM 08/08/2020    8:57 PM 04/06/2020    4:32 AM  CMP  Glucose 70 - 99 mg/dL 161  87  096   BUN 6 - 20 mg/dL 13  15  24    Creatinine 0.44 - 1.00 mg/dL 0.45  4.09  8.11   Sodium 135 - 145 mmol/L 139  139  133   Potassium 3.5 - 5.1 mmol/L 3.9  3.8  3.6   Chloride 98 - 111 mmol/L 109  106  100   CO2 22 - 32 mmol/L 15  22  23    Calcium 8.9 - 10.3 mg/dL 91.4  9.8  9.9   Total Protein 6.5 - 8.1 g/dL 8.0  7.9  6.0   Total Bilirubin 0.3 - 1.2 mg/dL 0.8  0.4  0.8   Alkaline Phos 38 - 126 U/L 70  74  43   AST 15 - 41 U/L 21  21  16    ALT 0 - 44 U/L 24  18  19     Lipid Panel     Component  Value Date/Time   CHOL 285 (H) 09/29/2017 1024   TRIG 89 09/29/2017 1024   HDL 91 09/29/2017 1024   CHOLHDL 3.1 09/29/2017 1024   CHOLHDL 2.5 01/15/2015 0245   VLDL 15 01/15/2015 0245   LDLCALC 176 (H) 09/29/2017 1024    CBC    Component Value Date/Time   WBC 12.4 (H) 09/24/2022 0934   RBC 5.35 (H) 09/24/2022 0934    HGB 16.8 (H) 09/24/2022 0934   HCT 48.6 (H) 09/24/2022 0934   PLT 210 09/24/2022 0934   MCV 90.8 09/24/2022 0934   MCH 31.4 09/24/2022 0934   MCHC 34.6 09/24/2022 0934   RDW 12.8 09/24/2022 0934   LYMPHSABS 1.2 08/08/2020 2057   MONOABS 0.6 08/08/2020 2057   EOSABS 0.1 08/08/2020 2057   BASOSABS 0.1 08/08/2020 2057    ASSESSMENT AND PLAN: 1. Essential hypertension (Primary) Blood pressure is elevated today.  She forgot to take amlodipine and labetalol.  She states that she has them in her car and will take them when she leaves here.  Encouraged to set an alarm on her phone to remind her to take her medications if necessary.  We will have her follow-up with the RN in 2 weeks for repeat blood pressure check. - Re-check Vital Signs  2. Mixed hyperlipidemia Continue pravastatin.  3. Prediabetes Discussed and encourage healthy eating habits.  Advised to choose healthier snacks like fruits and unsalted nuts instead of chocolate some cookies.  4. Kidney transplant status Plugged in with nephrology and with transplant clinic. - cinacalcet (SENSIPAR) 30 MG tablet; 1 tab PO Q Mon/Wed/Frid  Dispense: 36 tablet; Refill: 1  5. Screening for colon cancer Information printed on her discharge summary for her to call [facility] to schedule the colonoscopy as they have been trying to reach her.    Patient was given the opportunity to ask questions.  Patient verbalized understanding of the plan and was able to repeat key elements of the plan.   This documentation was completed using Paediatric nurse.  Any transcriptional errors are unintentional.  No orders of the defined types were placed in this encounter.    Requested Prescriptions    No prescriptions requested or ordered in this encounter    No follow-ups on file.  Jonah Blue, MD, FACP

## 2023-10-30 ENCOUNTER — Ambulatory Visit: Payer: Medicaid Other

## 2024-02-19 ENCOUNTER — Ambulatory Visit: Payer: Self-pay | Admitting: Internal Medicine

## 2024-04-19 ENCOUNTER — Telehealth: Payer: Self-pay | Admitting: Internal Medicine

## 2024-04-19 NOTE — Telephone Encounter (Signed)
 Called patient, no answer. Left voicemail confirming upcoming appointment on 04/21/2024. Provided callback number for any questions or changes.

## 2024-04-21 ENCOUNTER — Encounter: Payer: Self-pay | Admitting: Internal Medicine

## 2024-04-21 ENCOUNTER — Ambulatory Visit: Attending: Internal Medicine | Admitting: Internal Medicine

## 2024-04-21 VITALS — BP 131/82 | HR 76 | Temp 98.3°F | Ht 67.0 in | Wt 164.0 lb

## 2024-04-21 DIAGNOSIS — I10 Essential (primary) hypertension: Secondary | ICD-10-CM | POA: Diagnosis not present

## 2024-04-21 DIAGNOSIS — Z940 Kidney transplant status: Secondary | ICD-10-CM

## 2024-04-21 DIAGNOSIS — Z1211 Encounter for screening for malignant neoplasm of colon: Secondary | ICD-10-CM

## 2024-04-21 DIAGNOSIS — E782 Mixed hyperlipidemia: Secondary | ICD-10-CM

## 2024-04-21 DIAGNOSIS — R7303 Prediabetes: Secondary | ICD-10-CM

## 2024-04-21 DIAGNOSIS — Z1231 Encounter for screening mammogram for malignant neoplasm of breast: Secondary | ICD-10-CM

## 2024-04-21 LAB — LAB REPORT - SCANNED: EGFR: 70

## 2024-04-21 LAB — POCT GLYCOSYLATED HEMOGLOBIN (HGB A1C): HbA1c, POC (prediabetic range): 6.2 % (ref 5.7–6.4)

## 2024-04-21 LAB — GLUCOSE, POCT (MANUAL RESULT ENTRY): POC Glucose: 121 mg/dL — AB (ref 70–99)

## 2024-04-21 NOTE — Patient Instructions (Signed)

## 2024-04-21 NOTE — Progress Notes (Signed)
 Patient ID: Melody [name], female    DOB: 01/11/1976  MRN: [id number]  CC: Hypertension (HTN f/u. /No questions / concerns/No to flu & shingles vax - will consult with nephrology. Janiece to mommamogram, yes to colonoscopy )   Subjective: Melody [name] is a 48 y.o. female who presents for chronic ds management. Her concerns today include:  Pt with hx of HTN, former tob dep, ESRD s/p transplant, ACD, chronic diastolic CHF, marijuana use, PreDM.   Discussed the use of AI scribe software for clinical note transcription with the patient, who gave verbal consent to proceed.  History of Present Illness Melody [name] is a 48 year old female with hypertension, hyperlipidemia, and prediabetes who presents for a follow-up visit.  HTN: Her blood pressure was measured at 134/91 mmHg during today's visit, but it was 118/62 mmHg at her nephrologist's office less than an hour prior. She is currently taking labetalol  200 mg twice daily and amlodipine  10 mg daily. She is limiting her salt intake and experiences swelling in her legs only if she sits for prolonged periods, such as during travel, and does not use compression socks. No chest pain or shortness of breath.  Hyperlipidemia, she continues to take pravastatin and tolerates it well.  History of kidney transplant: She mentions that blood tests were performed at the nephrologist's office today. She is on prednisone  5 mg, Sensipar  30 mg daily, mycophenolate 540 mg twice daily, and Enviris 5 mg daily for her kidney transplant management. Her last visit with the transplant team was in January at [facility].  DM: Results for orders placed or performed in visit on 04/21/24  POCT glycosylated hemoglobin (Hb A1C)   Collection Time: 04/21/24  4:54 PM  Result Value Ref Range   Hemoglobin A1C     HbA1c POC (<> result, manual entry)     HbA1c, POC (prediabetic range) 6.2 5.7 - 6.4 %   HbA1c, POC (controlled diabetic range)     POCT glucose (manual entry)   Collection Time: 04/21/24  4:55 PM  Result Value Ref Range   POC Glucose 121 (A) 70 - 99 mg/dl  Regarding her prediabetes, she is mindful of her diet, trying to avoid sugary foods, and drinks more water. Her A1c was checked today.  HM: She declines receiving flu shot from us  today stating that she will plan to get it from her nephrologist.  Due for mammogram.  This was ordered in the fall of last year.  She does not remember being called.  Due for colon cancer screening.  I have given her the number to call [facility] on last visit.  She states she called and they were supposed to call her back.    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 pylori 03/02/2019   Immunosuppression (HCC) 02/23/2019   Acute bursitis of left shoulder 05/18/2018   Marijuana use, continuous 05/04/2018   Multiple thyroid  nodules 05/04/2018   Cervical radiculopathy 05/04/2018   Former tobacco use 09/29/2017   Pre-transplant evaluation for kidney transplant 02/12/2017   Drug abuse [facility])    Essential hypertension  Current Outpatient Medications on File Prior to Visit  Medication Sig Dispense Refill   amLODipine  (NORVASC ) 10 MG tablet Take 1 tablet (10 mg total) by mouth daily. 30 tablet 6   cinacalcet  (SENSIPAR ) 30 MG tablet 1 tab PO Q Mon/Wed/Frid 36 tablet 1   ENVARSUS  XR 1 MG TB24 Take 6 mg by mouth daily.      labetalol  (NORMODYNE ) 200 MG tablet Take 1 tablet (200 mg total) by mouth 2 (two) times daily. 60 tablet 6   mycophenolate (MYFORTIC)  180 MG EC tablet Take 540 mg by mouth 2 (two) times daily.     pravastatin (PRAVACHOL) 20 MG tablet Take 1 tablet by mouth daily.     predniSONE  (DELTASONE ) 20 MG tablet Take 2 tablets (40 mg total) by mouth daily. 10 tablet 0   fluticasone  (FLONASE ) 50 MCG/ACT nasal spray Place 1 spray into both nostrils daily. (Patient not taking: Reported on 10/16/2023) 16 g 0   No current facility-administered medications on file prior to visit.    Allergies  Allergen Reactions   Aluminum Chloride Hives   Codeine  Other (See Comments)    Tylenol  #3 causes nightmares   Tramadol  Hives    Social History   Socioeconomic History   Marital status: Single    Spouse name: Not on file   Number of children: 2    Years of education: some college   Highest education level: Not on file  Occupational History   Occupation: Owns Education officer, environmental business  Tobacco Use   Smoking status: Former    Types: Software engineer, Cigarettes    Start date: 09/11/2016   Smokeless tobacco: Never   Tobacco comments:    pt stopped smoking a couple months ago  Vaping Use   Vaping status: Never Used  Substance and Sexual Activity   Alcohol use: Yes    Alcohol/week: 0.0 standard drinks of alcohol    Comment: occasionally   Drug use: Yes    Types: Marijuana    Comment: occasionally   Sexual activity: Yes    Birth control/protection: None  Other Topics Concern   Not on file  Social History Narrative   Looking for work    24 and 13 yr son's.    3 grandkids    Social Drivers of Corporate investment banker Strain: Low Risk  (06/15/2023)   Overall Financial Resource Strain (CARDIA)    Difficulty of Paying Living Expenses: Not hard at all  Food Insecurity: No Food Insecurity (06/15/2023)   Hunger Vital Sign    Worried About Running Out of Food in the Last Year: Never true    Ran Out of Food in the Last Year: Never true  Transportation Needs: No Transportation Needs (06/15/2023)   PRAPARE - Administrator, Civil Service  (Medical): No    Lack of Transportation (Non-Medical): No  Physical Activity: Insufficiently Active (06/15/2023)   Exercise Vital Sign    Days of Exercise per Week: 2 days    Minutes of Exercise per Session: 30 min  Stress: No Stress Concern Present (06/15/2023)   [employer] of Occupational Health - Occupational Stress Questionnaire    Feeling of Stress : Not at all  Social Connections: Socially Isolated (06/15/2023)   Social Connection and Isolation Panel    Frequency of Communication with Friends and Family: More than three times a week    Frequency of Social Gatherings with Friends and Family: More than three times a week    Attends Religious Services:  Never    Active Member of Clubs or Organizations: No    Attends Banker Meetings: Never    Marital Status: Never married  Intimate Partner Violence: Not At Risk (06/15/2023)   Humiliation, Afraid, Rape, and Kick questionnaire    Fear of Current or Ex-Partner: No    Emotionally Abused: No    Physically Abused: No    Sexually Abused: No    Family History  Problem Relation Age of Onset   CAD Mother    Hypertension Mother    Heart disease Mother    Kidney failure Mother    Cirrhosis Father    CAD Brother    Hypertension Other    Kidney failure Other    Hypertension Maternal Grandmother    Kidney failure Maternal Grandmother    Congestive Heart Failure Maternal Grandmother    Breast cancer Sister    Thyroid  disease Neg Hx     Past Surgical History:  Procedure Laterality Date   AV FISTULA PLACEMENT Right 09/15/2016   Procedure: ARTERIOVENOUS (AV) FISTULA CREATION UPPER RIGHT ARM;  Surgeon: Krystal JULIANNA Doing, MD;  Location: [facility] OR;  Service: Vascular;  Laterality: Right;   AV FISTULA PLACEMENT Left 12/30/2016   Procedure: ARTERIOVENOUS (AV) FISTULA CREATION USING GORETEX STRETCH GRAFT;  Surgeon: Harvey Carlin BRAVO, MD;  Location: [facility] OR;  Service: Vascular;  Laterality: Left;   AV FISTULA PLACEMENT Left 06/15/2017    Procedure: INSERTION OF ARTERIOVENOUS (AV) GORE-TEX GRAFT X 40CM LEFT ARM;  Surgeon: Doing Krystal JULIANNA, MD;  Location: [facility] OR;  Service: Vascular;  Laterality: Left;   AV FISTULA PLACEMENT Right 08/05/2018   Procedure: Insertion of Right arm gortex graft;  Surgeon: Doing Krystal JULIANNA, MD;  Location: [facility] OR;  Service: Vascular;  Laterality: Right;   DILATION AND CURETTAGE OF UTERUS     INSERTION OF DIALYSIS CATHETER Right 09/15/2016   Procedure: INSERTION OF DIALYSIS CATHETER ON RIGHT SIDE;  Surgeon: Krystal JULIANNA Doing, MD;  Location: [facility] OR;  Service: Vascular;  Laterality: Right;   KIDNEY TRANSPLANT Right 02/18/2019    ROS: Review of Systems Negative except as stated above  PHYSICAL EXAM: BP 131/82   Pulse 76   Temp 98.3 F (36.8 C) (Oral)   Ht 5' 7 (1.702 m)   Wt 164 lb (74.4 kg)   SpO2 98%   BMI 25.69 kg/m   Physical Exam  General appearance - alert, well appearing, middle-aged African-American female and in no distress Mental status - normal mood, behavior, speech, dress, motor activity, and thought processes Chest - clear to auscultation, no wheezes, rales or rhonchi, symmetric air entry Heart - normal rate, regular rhythm, normal S1, S2, no murmurs, rubs, clicks or gallops Extremities - peripheral pulses normal, no pedal edema, no clubbing or cyanosis      Latest Ref Rng & Units 09/24/2022    9:34 AM 08/08/2020    8:57 PM 04/06/2020    4:32 AM  CMP  Glucose 70 - 99 mg/dL 807  87  810   BUN 6 - 20 mg/dL 13  15  24    Creatinine 0.44 - 1.00 mg/dL 8.86  8.87  8.64   Sodium 135 - 145 mmol/L 139  139  133   Potassium 3.5 - 5.1 mmol/L 3.9  3.8  3.6   Chloride 98 - 111 mmol/L 109  106  100   CO2 22 - 32 mmol/L 15  22  23    Calcium  8.9 - 10.3 mg/dL 89.1  9.8  9.9   Total Protein 6.5 - 8.1 g/dL 8.0  7.9  6.0   Total Bilirubin 0.3 - 1.2 mg/dL 0.8  0.4  0.8   Alkaline Phos 38 - 126 U/L 70  74  43   AST 15 - 41 U/L 21  21  16    ALT 0 - 44 U/L 24  18  19     Lipid Panel     Component  Value Date/Time   CHOL 285 (H) 09/29/2017 1024   TRIG 89 09/29/2017 1024   HDL 91 09/29/2017 1024   CHOLHDL 3.1 09/29/2017 1024   CHOLHDL 2.5 01/15/2015 0245   VLDL 15 01/15/2015 0245   LDLCALC 176 (H) 09/29/2017 1024    CBC    Component Value Date/Time   WBC 12.4 (H) 09/24/2022 0934   RBC 5.35 (H) 09/24/2022 0934   HGB 16.8 (H) 09/24/2022 0934   HCT 48.6 (H) 09/24/2022 0934   PLT 210 09/24/2022 0934   MCV 90.8 09/24/2022 0934   MCH 31.4 09/24/2022 0934   MCHC 34.6 09/24/2022 0934   RDW 12.8 09/24/2022 0934   LYMPHSABS 1.2 08/08/2020 2057   MONOABS 0.6 08/08/2020 2057   EOSABS 0.1 08/08/2020 2057   BASOSABS 0.1 08/08/2020 2057    ASSESSMENT AND PLAN: 1. Essential hypertension (Primary) Close to goal.  She reports normal reading when she saw the nephrologist earlier today.  No changes made.  She will continue labetalol  200 mg twice a day and Norvasc  10 mg daily  2. Prediabetes Likely due to her being on prednisone .  Encouraged her to continue to try to eat healthy. - POCT glycosylated hemoglobin (Hb A1C) - POCT glucose (manual entry)  3. Mixed hyperlipidemia Continue pravastatin.  4. Encounter for screening mammogram for malignant neoplasm of breast - MM 3D SCREENING MAMMOGRAM BILATERAL BREAST; Future  5. Screening for colon cancer - Ambulatory referral to Gastroenterology  6. Kidney transplant status No changes in immunosuppressive regimen that is managed by [facility] transplant and [facility]. Current medications include prednisone , Sensipar , mycophenolate, and Enviris.   Patient was given the opportunity to ask questions.  Patient verbalized understanding of the plan and was able to repeat key elements of the plan.   This documentation was completed using Paediatric nurse.  Any transcriptional errors are unintentional.  Orders Placed This Encounter  Procedures   MM 3D SCREENING MAMMOGRAM BILATERAL BREAST   CBC   Comprehensive metabolic  panel with GFR   Lipid panel   Ambulatory referral to Gastroenterology   POCT glycosylated hemoglobin (Hb A1C)   POCT glucose (manual entry)     Requested Prescriptions    No prescriptions requested or ordered in this encounter    Return in about 4 months (around 08/21/2024).  Barnie Louder, MD, FACP

## 2024-04-28 ENCOUNTER — Ambulatory Visit
Admission: RE | Admit: 2024-04-28 | Discharge: 2024-04-28 | Disposition: A | Discharge status: Home / Self Care | Source: Ambulatory Visit | Attending: Internal Medicine | Admitting: Internal Medicine

## 2024-04-28 DIAGNOSIS — Z1231 Encounter for screening mammogram for malignant neoplasm of breast: Secondary | ICD-10-CM

## 2024-05-04 ENCOUNTER — Ambulatory Visit: Payer: Self-pay | Admitting: Internal Medicine

## 2024-08-29 ENCOUNTER — Ambulatory Visit: Admitting: Internal Medicine

## 2024-08-31 ENCOUNTER — Encounter (HOSPITAL_COMMUNITY): Payer: Self-pay

## 2024-08-31 ENCOUNTER — Emergency Department (HOSPITAL_COMMUNITY)
Admission: EM | Admit: 2024-08-31 | Discharge: 2024-08-31 | Disposition: A | Discharge status: Home / Self Care | Attending: Emergency Medicine | Admitting: Emergency Medicine

## 2024-08-31 ENCOUNTER — Emergency Department (HOSPITAL_COMMUNITY)

## 2024-08-31 DIAGNOSIS — Y990 Civilian activity done for income or pay: Secondary | ICD-10-CM | POA: Diagnosis not present

## 2024-08-31 DIAGNOSIS — S060X0A Concussion without loss of consciousness, initial encounter: Secondary | ICD-10-CM | POA: Insufficient documentation

## 2024-08-31 DIAGNOSIS — G44319 Acute post-traumatic headache, not intractable: Secondary | ICD-10-CM | POA: Insufficient documentation

## 2024-08-31 DIAGNOSIS — W208XXA Other cause of strike by thrown, projected or falling object, initial encounter: Secondary | ICD-10-CM | POA: Insufficient documentation

## 2024-08-31 DIAGNOSIS — S0990XA Unspecified injury of head, initial encounter: Secondary | ICD-10-CM | POA: Diagnosis present

## 2024-08-31 MED ORDER — METOCLOPRAMIDE HCL 10 MG PO TABS: 10.0000 mg | ORAL_TABLET | Freq: Four times a day (QID) | ORAL | 0 refills | Status: AC | PRN

## 2024-08-31 MED ORDER — OXYCODONE HCL 5 MG PO TABS: 5.0000 mg | ORAL_TABLET | ORAL | 0 refills | Status: AC | PRN

## 2024-08-31 MED ORDER — OXYCODONE HCL 5 MG PO TABS
5.0000 mg | ORAL_TABLET | ORAL | Status: AC
  Administered 2024-08-31: 5 mg via ORAL
  Filled 2024-08-31: qty 1

## 2024-08-31 MED ORDER — METOCLOPRAMIDE HCL 10 MG PO TABS
10.0000 mg | ORAL_TABLET | Freq: Once | ORAL | Status: AC
  Administered 2024-08-31: 10 mg via ORAL
  Filled 2024-08-31: qty 1

## 2024-08-31 NOTE — ED Triage Notes (Signed)
 Pt BIB EMS from home, pt reports she was at work and a glass tray fell on top of her head. Pt c/o headache, feels achy and has n/v since yesterday  160/70 HR 102 97% room air CBG 98

## 2024-08-31 NOTE — Discharge Instructions (Addendum)
 You were seen for your head injury in the emergency department. It is likely that you had a concussion.  At home, please use tylenol  and reglan  for any headaches that you may have. You may also take the oxycodone  we have prescribed you for any breakthrough pain that may have.  Do not take this before driving or operating heavy machinery.  Do not take this medication with alcohol.  Return gradually to work and school over the next week. Take breaks if you are having headaches or trouble thinking clearly.   Follow-up with your primary doctor or concussion clinic in 1 week regarding your visit.  DO NOT return to playing sports or activities where you could sustain additional head trauma until cleared to do so by a healthcare provider.   Return immediately to the emergency department if you experience any of the following: severe headache, persistent vomiting, or any other concerning symptoms.    Thank you for visiting our Emergency Department. It was a pleasure taking care of you today.

## 2024-08-31 NOTE — ED Provider Triage Note (Signed)
 Emergency Medicine Provider Triage Evaluation Note  Melody [name] , a 49 y.o. female  was evaluated in triage.  Pt complains of head injury. Pt report a metal tray fell and struck her head yesterday while at work.  Tray was approximately 7ft off the ground.  Report headache, nausea, dizzy.  No neck pain, no confusion, numbness, weakness or laceration  Review of Systems  Positive: As above Negative: As above  Physical Exam  BP (!) 136/104   Pulse 93   Temp 99.7 F (37.6 C) (Oral)   Resp 20   SpO2 100%  Gen:   Awake, no distress   Resp:  Normal effort  MSK:   Moves extremities without difficulty  Other:    Medical Decision Making  Medically screening exam initiated at 10:17 AM.  Appropriate orders placed.  Melody [name] was informed that the remainder of the evaluation will be completed by another provider, this initial triage assessment does not replace that evaluation, and the importance of remaining in the ED until their evaluation is complete.     Melody Colon, PA-C 08/31/24 1018

## 2024-08-31 NOTE — ED Provider Notes (Signed)
 " [place] [facility] Provider Note   CSN: [id number] Arrival date & time: 08/31/24  9063     Patient presents with: No chief complaint on file.   Melody [name] is a 49 y.o. female.   49 year old female history of kidney transplant on Myfortic who presents emergency department after head injury.  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  No LOC.  Has been having a headache and nausea and vomiting since.  Not on blood thinners.  Has had difficulty concentrating as well.  Has been trying Tylenol  without relief.  Currently headache is 10/10 severity.       Prior to Admission medications  Medication Sig Start Date End Date Taking? Authorizing Provider  metoCLOPramide  (REGLAN ) 10 MG tablet Take 1 tablet (10 mg total) by mouth every 6 (six) hours as needed for nausea or vomiting (headache). 08/31/24  Yes Yolande Lamar BROCKS, MD  oxyCODONE  (ROXICODONE ) 5 MG immediate release tablet Take 1 tablet (5 mg total) by mouth every 4 (four) hours as needed for severe pain (pain score 7-10). 08/31/24  Yes Yolande Lamar BROCKS, MD  amLODipine  (NORVASC ) 10 MG tablet Take 1 tablet (10 mg total) by mouth daily. 12/26/21   Vicci Barnie NOVAK, MD  cinacalcet  (SENSIPAR ) 30 MG tablet 1 tab PO Q Mon/Wed/Frid 10/16/23   Vicci Barnie NOVAK, MD  ENVARSUS  XR 1 MG TB24 Take 6 mg by mouth daily.  02/08/20   [provider]  fluticasone  (FLONASE ) 50 MCG/ACT nasal spray Place 1 spray into both nostrils daily. Patient not taking: Reported on 10/16/2023 09/21/23   Raspet, Rocky POUR, PA-C  labetalol  (NORMODYNE ) 200 MG tablet Take 1 tablet (200 mg total) by mouth 2 (two) times daily. 12/26/21   Vicci Barnie NOVAK, MD  mycophenolate (MYFORTIC) 180 MG EC tablet Take 540 mg by mouth 2 (two) times daily. 02/20/20   [provider]  pravastatin (PRAVACHOL) 20 MG tablet Take 1 tablet by mouth daily. 09/30/21    [provider]  predniSONE  (DELTASONE ) 20 MG tablet Take 2 tablets (40 mg total) by mouth daily. 06/12/20   Rolinda Rogue, MD    Allergies: Aluminum chloride, Codeine , and Tramadol     Review of Systems  Updated Vital Signs BP (!) 149/94   Pulse 74   Temp 99 F (37.2 C)   Resp 17   SpO2 98%   Physical Exam Constitutional:      Appearance: Normal appearance.  HENT:     Head: Normocephalic and atraumatic.     Right Ear: External ear normal.     Left Ear: External ear normal.     Nose: Nose normal.  Eyes:     Extraocular Movements: Extraocular movements intact.     Conjunctiva/sclera: Conjunctivae normal.     Pupils: Pupils are equal, round, and reactive to light.     Comments: Pupils 4 mm bilaterally  Neck:     Comments: No C-spine midline tenderness palpation. Neurological:     General: No focal deficit present.     Mental Status: She is alert and oriented to person, place, and time. Mental status is at baseline.     Cranial Nerves: No cranial nerve deficit.     Sensory: No sensory deficit.     Motor: No weakness.     (all labs ordered are listed, but only abnormal results are displayed) Labs Reviewed - No data  to display  EKG: None  Radiology: CT Head Wo Contrast Result Date: 08/31/2024 EXAM: CT HEAD WITHOUT CONTRAST 08/31/2024 10:39:39 AM TECHNIQUE: CT of the head was performed without the administration of intravenous contrast. Automated exposure control, iterative reconstruction, and/or weight based adjustment of the mA/kV was utilized to reduce the radiation dose to as low as reasonably achievable. COMPARISON: Head CT 03/15/2016. CLINICAL HISTORY: 50 year old female. A glass tray fell on top of her head. FINDINGS: BRAIN AND VENTRICLES: No acute hemorrhage. No evidence of acute infarct. No hydrocephalus. No extra-axial collection. No mass effect or midline shift. Brain volume is normal for age. Gray white differentiation is normal for age. Calcified  atherosclerosis at the skull base. No suspicious intracranial vascular hyperdensity. ORBITS: Disconduct gaze, nonspecific, with no discrete orbit injury identified. SINUSES: Paranasal sinuses are well aerated. SOFT TISSUES AND SKULL: No acute soft tissue abnormality. No discrete scalp soft tissue injury identified. No skull fracture. Tympanic cavities and mastoids are well aerated. IMPRESSION: 1. No acute traumatic injury identified. 2. Normal for age non-contrast appearance of the brain. Electronically signed by: Helayne Hurst MD 08/31/2024 10:46 AM EST RP Workstation: [id number]     Procedures   Medications Ordered in the ED  oxyCODONE  (Oxy IR/ROXICODONE ) immediate release tablet 5 mg (5 mg Oral Given 08/31/24 1222)  metoCLOPramide  (REGLAN ) tablet 10 mg (10 mg Oral Given 08/31/24 1222)                                    Medical Decision Making Risk Prescription drug management.   Melody [name] is a 49 year old female history of kidney transplant on Myfortic who presents emergency department after head injury.   Initial Ddx:  TBI, C-spine injury, facial fracture, concussion  MDM/course:  Given the patient's head trauma feel the likely had a concussion.  Had a head CT from triage that did not show evidence of TBI.  No C-spine tenderness to palpation or neck pain at this point in time so do not feel that CT find imaging is warranted.  Feel the patient likely had a concussion based on her symptoms.  Given pain medication and was feeling much better afterwards. Patient counseled on concussion precautions.  Was instructed to take over-the-counter pain medication for any headaches that they would have and gradually return to work and school.  Given oxycodone  because she does not want to take NSAIDs with her kidney transplant.  Counseled to not return to sports until cleared to do so by healthcare provider.  Will have them follow-up with their primary doctor or sports medicine clinic in 1  week.  This patient presents to the ED for concern of complaints listed in HPI, this involves an extensive number of treatment options, and is a complaint that carries with it a high risk of complications and morbidity. Disposition including potential need for admission considered.   Dispo: DC Home. Return precautions discussed including, but not limited to, those listed in the AVS. Allowed pt time to ask questions which were answered fully prior to dc.  Records reviewed Outpatient Clinic Notes I independently reviewed the following imaging with scope of interpretation limited to determining acute life threatening conditions related to emergency care: CT Head and agree with the radiologist interpretation with the following exceptions: None   Final diagnoses:  Concussion without loss of consciousness, initial encounter  Acute post-traumatic headache, not intractable    ED Discharge Orders  Ordered    metoCLOPramide  (REGLAN ) 10 MG tablet  Every 6 hours PRN        08/31/24 1325    oxyCODONE  (ROXICODONE ) 5 MG immediate release tablet  Every 4 hours PRN        08/31/24 1325               Yolande Lamar BROCKS, MD 08/31/24 2026  "

## 2024-09-08 ENCOUNTER — Encounter: Payer: Self-pay | Admitting: *Deleted

## 2024-09-08 ENCOUNTER — Ambulatory Visit: Admission: EM | Admit: 2024-09-08 | Discharge: 2024-09-08 | Disposition: A | Discharge status: Home / Self Care

## 2024-09-08 ENCOUNTER — Ambulatory Visit (INDEPENDENT_AMBULATORY_CARE_PROVIDER_SITE_OTHER)

## 2024-09-08 DIAGNOSIS — R059 Cough, unspecified: Secondary | ICD-10-CM

## 2024-09-08 DIAGNOSIS — J189 Pneumonia, unspecified organism: Secondary | ICD-10-CM | POA: Diagnosis not present

## 2024-09-08 MED ORDER — BENZONATATE 100 MG PO CAPS: 100.0000 mg | ORAL_CAPSULE | Freq: Three times a day (TID) | ORAL | 0 refills | Status: AC

## 2024-09-08 MED ORDER — AMOXICILLIN-POT CLAVULANATE 875-125 MG PO TABS: 1.0000 | ORAL_TABLET | Freq: Two times a day (BID) | ORAL | 0 refills | Status: AC

## 2024-09-08 MED ORDER — AZITHROMYCIN 250 MG PO TABS: 250.0000 mg | ORAL_TABLET | Freq: Every day | ORAL | 0 refills | Status: AC

## 2024-09-08 MED ORDER — PREDNISONE 50 MG PO TABS: ORAL_TABLET | ORAL | 0 refills | Status: AC

## 2024-09-08 NOTE — ED Provider Notes (Signed)
 " [facility]    CSN: [id number] Arrival date & time: 09/08/24  1832      History   Chief Complaint Chief Complaint  Patient presents with   Cough    HPI Melody [name] is a 49 y.o. female.   Pt presents today due to cough productive of white sputum for the past 4-5 days, reduced appetite, post tussive emesis, shortness of breath, and fatigue. Pt states that she has been using Coricidin for symptoms with no significant relief. Pt states that symptoms worsen when she is lying supine to sleep.   The history is provided by the patient.  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 pylori 03/02/2019   Immunosuppression 02/23/2019   Acute bursitis of left shoulder 05/18/2018   Marijuana use, continuous 05/04/2018   Multiple thyroid  nodules 05/04/2018   Cervical radiculopathy 05/04/2018   Former tobacco use 09/29/2017   Pre-transplant evaluation for kidney transplant 02/12/2017   Drug abuse [facility])    Essential hypertension     Past Surgical History:  Procedure Laterality Date   AV FISTULA PLACEMENT Right 09/15/2016   Procedure: ARTERIOVENOUS (AV) FISTULA CREATION UPPER RIGHT ARM;  Surgeon: Krystal JULIANNA Doing, MD;  Location: [facility] OR;  Service: Vascular;  Laterality: Right;   AV FISTULA PLACEMENT Left 12/30/2016    Procedure: ARTERIOVENOUS (AV) FISTULA CREATION USING GORETEX STRETCH GRAFT;  Surgeon: Harvey Carlin BRAVO, MD;  Location: [facility] OR;  Service: Vascular;  Laterality: Left;   AV FISTULA PLACEMENT Left 06/15/2017   Procedure: INSERTION OF ARTERIOVENOUS (AV) GORE-TEX GRAFT X 40CM LEFT ARM;  Surgeon: Doing Krystal JULIANNA, MD;  Location: [facility] OR;  Service: Vascular;  Laterality: Left;   AV FISTULA PLACEMENT Right 08/05/2018   Procedure: Insertion of Right arm gortex graft;  Surgeon: Doing Krystal JULIANNA, MD;  Location: [facility] OR;  Service: Vascular;  Laterality: Right;   DILATION AND CURETTAGE OF UTERUS     INSERTION OF DIALYSIS CATHETER Right 09/15/2016   Procedure: INSERTION OF DIALYSIS CATHETER ON RIGHT SIDE;  Surgeon: Krystal JULIANNA Doing, MD;  Location: [facility] OR;  Service: Vascular;  Laterality: Right;   KIDNEY TRANSPLANT Right 02/18/2019    OB History   No obstetric history on file.      Home Medications    Prior to Admission medications  Medication Sig Start Date End Date Taking? Authorizing Provider  amLODipine  (NORVASC ) 10 MG tablet Take 1 tablet (10 mg total) by mouth daily. 12/26/21  Yes Vicci Barnie NOVAK, MD  cetirizine  (ZYRTEC ) 10 MG tablet Take 10 mg by mouth. 09/15/19  Yes [provider]  cinacalcet  (SENSIPAR ) 30 MG tablet 1 tab PO Q Mon/Wed/Frid 10/16/23  Yes Vicci Barnie NOVAK, MD  ENVARSUS  XR 1 MG TB24 Take 6 mg by mouth daily.  02/08/20  Yes [provider]  labetalol  (NORMODYNE ) 200 MG tablet Take 1 tablet (200 mg total) by mouth 2 (two) times daily. 12/26/21  Yes Vicci Barnie NOVAK, MD  metoCLOPramide  (REGLAN ) 10 MG tablet Take 1 tablet (10 mg total) by mouth every 6 (six) hours as needed for nausea or vomiting (headache). 08/31/24  Yes Yolande Lamar BROCKS, MD  mycophenolate (MYFORTIC) 180 MG EC tablet Take 540 mg by mouth 2 (two) times daily. 02/20/20  Yes [provider]  oxyCODONE  (ROXICODONE ) 5 MG immediate release tablet Take 1 tablet (5 mg total) by mouth every 4 (four) hours as  needed for severe pain (pain score 7-10). 08/31/24  Yes Yolande Lamar BROCKS, MD  predniSONE  (DELTASONE ) 5 MG tablet Take 5 mg by mouth daily.   Yes [provider]  fluticasone  (FLONASE ) 50 MCG/ACT nasal spray Place 1 spray into both nostrils daily. Patient not taking: Reported on 10/16/2023 09/21/23   Raspet, Erin K, PA-C  pravastatin (PRAVACHOL) 20 MG tablet Take 1 tablet by mouth daily. 09/30/21   [provider]  predniSONE  (DELTASONE ) 20 MG tablet Take 2 tablets (40 mg total) by mouth daily. 06/12/20   Rolinda Rogue, MD    Family History Family History  Problem Relation Age of Onset   CAD Mother    Hypertension Mother    Heart disease Mother    Kidney failure Mother    Cirrhosis Father    CAD Brother    Hypertension Other    Kidney failure Other    Hypertension Maternal Grandmother    Kidney failure Maternal Grandmother    Congestive Heart Failure Maternal Grandmother    Breast cancer Sister    Thyroid  disease Neg Hx     Social History Social History[1]   Allergies   Aluminum chloride, Codeine , and Tramadol    Review of Systems Review of Systems  Respiratory:  Positive for cough.      Physical Exam Triage Vital Signs ED Triage Vitals  Encounter Vitals Group     BP 09/08/24 1852 119/86     Girls Systolic BP Percentile --      Girls Diastolic BP Percentile --      Boys Systolic BP Percentile --      Boys Diastolic BP Percentile --      Pulse Rate 09/08/24 1852 80     Resp 09/08/24 1852 16     Temp 09/08/24 1852 97.9 F (36.6 C)     Temp Source 09/08/24 1852 Oral     SpO2 09/08/24 1852 95 %     Weight --      Height --      Head Circumference --      Peak Flow --      Pain Score 09/08/24 1849 6     Pain Loc --      Pain Education --      Exclude from Growth Chart --    No data found.  Updated Vital Signs BP 119/86 (BP Location: Left Arm)   Pulse 80   Temp 97.9 F (36.6 C) (Oral)   Resp 16   SpO2 95%   Visual Acuity Right Eye  Distance:   Left Eye Distance:   Bilateral Distance:    Right Eye Near:   Left Eye Near:    Bilateral Near:     Physical Exam Vitals and nursing note reviewed.  Constitutional:      General: She is not in acute distress.    Appearance: Normal appearance. She  is not ill-appearing, toxic-appearing or diaphoretic.  Eyes:     General: No scleral icterus. Cardiovascular:     Rate and Rhythm: Normal rate and regular rhythm.     Heart sounds: Normal heart sounds.  Pulmonary:     Effort: Pulmonary effort is normal. No respiratory distress.     Breath sounds: Normal breath sounds. No wheezing or rhonchi.  Skin:    General: Skin is warm.  Neurological:     Mental Status: She is alert and oriented to person, place, and time.  Psychiatric:        Mood and Affect: Mood normal.        Behavior: Behavior normal.      UC Treatments / Results  Labs (all labs ordered are listed, but only abnormal results are displayed) Labs Reviewed - No data to display  EKG   Radiology No results found.  Procedures Procedures (including critical care time)  Medications Ordered in UC Medications - No data to display  Initial Impression / Assessment and Plan / UC Course  I have reviewed the triage vital signs and the nursing notes.  Pertinent labs & imaging results that were available during my care of the patient were reviewed by me and considered in my medical decision making (see chart for details).     Final Clinical Impressions(s) / UC Diagnoses   Final diagnoses:  Cough, unspecified type   Discharge Instructions   None    ED Prescriptions   None    PDMP not reviewed this encounter.     [1]  Social History Tobacco Use   Smoking status: Some Days    Types: Cigars, Cigarettes    Start date: 09/11/2016   Smokeless tobacco: Never   Tobacco comments:    pt stopped smoking a couple months ago  Vaping Use   Vaping status: Never Used  Substance Use Topics   Alcohol use:  Not Currently    Comment: occasionally   Drug use: Yes    Types: Marijuana    Comment: occasionally     Andra Corean BROCKS, PA-C 09/08/24 1958  "

## 2024-09-08 NOTE — ED Triage Notes (Signed)
 Cough x 4-5 days (sometimes followed by vomiting). Also aches in back and shoulders. Unknown fever. She tried coricidin with some relief. Last dose this morning. She is a kidney transplant recipient

## 2024-09-08 NOTE — Discharge Instructions (Addendum)
 You have been diagnosed with pneumonia, we will prescribed you 2 antibiotics, steroids, and a cough suppressant. Be sure to follow-up with PCP for 4-6 weeks for repeat chest xray for resolution.

## 2024-09-29 ENCOUNTER — Ambulatory Visit: Admitting: Internal Medicine

## 2024-11-01 ENCOUNTER — Ambulatory Visit: Admitting: Internal Medicine
